# Patient Record
Sex: Male | Born: 1965 | Race: White | Hispanic: No | Marital: Married | Smoking: Current every day smoker
Health system: Southern US, Community
[De-identification: ages and names within clinical notes are randomized; demographics above are authoritative.]

## PROBLEM LIST (undated history)

## (undated) DIAGNOSIS — I890 Lymphedema, not elsewhere classified: Secondary | ICD-10-CM

## (undated) DIAGNOSIS — M419 Scoliosis, unspecified: Secondary | ICD-10-CM

## (undated) DIAGNOSIS — G4730 Sleep apnea, unspecified: Secondary | ICD-10-CM

## (undated) DIAGNOSIS — L03116 Cellulitis of left lower limb: Secondary | ICD-10-CM

## (undated) HISTORY — DX: Cellulitis of left lower limb: L03.116

---

## 1998-11-12 ENCOUNTER — Encounter: Admission: RE | Admit: 1998-11-12 | Discharge: 1998-11-12 | Payer: Self-pay | Admitting: Family Medicine

## 2000-02-03 ENCOUNTER — Encounter: Admission: RE | Admit: 2000-02-03 | Discharge: 2000-02-03 | Payer: Self-pay | Admitting: Family Medicine

## 2000-02-03 ENCOUNTER — Encounter: Payer: Self-pay | Admitting: Family Medicine

## 2000-08-13 ENCOUNTER — Encounter: Admission: RE | Admit: 2000-08-13 | Discharge: 2000-08-13 | Payer: Self-pay | Admitting: Family Medicine

## 2000-08-13 ENCOUNTER — Encounter: Payer: Self-pay | Admitting: Family Medicine

## 2000-08-27 ENCOUNTER — Encounter: Admission: RE | Admit: 2000-08-27 | Discharge: 2000-08-27 | Payer: Self-pay | Admitting: Neurosurgery

## 2000-08-27 ENCOUNTER — Encounter: Payer: Self-pay | Admitting: Neurosurgery

## 2000-09-10 ENCOUNTER — Encounter: Payer: Self-pay | Admitting: Neurosurgery

## 2000-09-10 ENCOUNTER — Encounter: Admission: RE | Admit: 2000-09-10 | Discharge: 2000-09-10 | Payer: Self-pay | Admitting: Neurosurgery

## 2000-09-24 ENCOUNTER — Encounter: Payer: Self-pay | Admitting: Neurosurgery

## 2000-09-24 ENCOUNTER — Encounter: Admission: RE | Admit: 2000-09-24 | Discharge: 2000-09-24 | Payer: Self-pay | Admitting: Neurosurgery

## 2005-02-03 ENCOUNTER — Ambulatory Visit (HOSPITAL_BASED_OUTPATIENT_CLINIC_OR_DEPARTMENT_OTHER): Admission: RE | Admit: 2005-02-03 | Discharge: 2005-02-03 | Payer: Self-pay | Admitting: Family Medicine

## 2005-02-09 ENCOUNTER — Ambulatory Visit: Payer: Self-pay | Admitting: Internal Medicine

## 2005-04-01 ENCOUNTER — Encounter: Admission: RE | Admit: 2005-04-01 | Discharge: 2005-04-01 | Payer: Self-pay | Admitting: Family Medicine

## 2005-04-25 ENCOUNTER — Encounter: Admission: RE | Admit: 2005-04-25 | Discharge: 2005-04-25 | Payer: Self-pay | Admitting: *Deleted

## 2005-05-22 ENCOUNTER — Encounter: Admission: RE | Admit: 2005-05-22 | Discharge: 2005-05-22 | Payer: Self-pay | Admitting: Neurosurgery

## 2005-05-30 ENCOUNTER — Encounter: Admission: RE | Admit: 2005-05-30 | Discharge: 2005-05-30 | Payer: Self-pay | Admitting: Neurosurgery

## 2005-06-18 ENCOUNTER — Encounter: Admission: RE | Admit: 2005-06-18 | Discharge: 2005-06-18 | Payer: Self-pay | Admitting: Neurosurgery

## 2005-07-08 ENCOUNTER — Encounter: Admission: RE | Admit: 2005-07-08 | Discharge: 2005-07-08 | Payer: Self-pay | Admitting: Neurosurgery

## 2005-07-30 ENCOUNTER — Ambulatory Visit: Payer: Self-pay | Admitting: Internal Medicine

## 2005-07-30 ENCOUNTER — Observation Stay (HOSPITAL_COMMUNITY): Admission: EM | Admit: 2005-07-30 | Discharge: 2005-07-31 | Payer: Self-pay | Admitting: Emergency Medicine

## 2005-08-07 ENCOUNTER — Encounter (HOSPITAL_COMMUNITY): Admission: AD | Admit: 2005-08-07 | Discharge: 2005-10-09 | Payer: Self-pay | Admitting: Cardiovascular Disease

## 2005-09-05 ENCOUNTER — Ambulatory Visit (HOSPITAL_COMMUNITY): Admission: RE | Admit: 2005-09-05 | Discharge: 2005-09-05 | Payer: Self-pay | Admitting: Cardiovascular Disease

## 2009-01-29 ENCOUNTER — Encounter: Admission: RE | Admit: 2009-01-29 | Discharge: 2009-01-29 | Payer: Self-pay | Admitting: Internal Medicine

## 2009-02-08 ENCOUNTER — Encounter: Admission: RE | Admit: 2009-02-08 | Discharge: 2009-05-09 | Payer: Self-pay | Admitting: Diagnostic Neuroimaging

## 2010-06-14 NOTE — Procedures (Signed)
NAMEDEVARIO, [name]             ACCOUNT NO.:  [id number]   MEDICAL RECORD NO.:  [id number]          PATIENT TYPE:  OUT   LOCATION:  SLEEP CENTER                 FACILITY:  [facility]   PHYSICIAN:  Clinton D. Maple Hudson, M.D. DATE OF BIRTH:  [birth year]   DATE OF STUDY:  02/03/2005                              NOCTURNAL POLYSOMNOGRAM   REFERRING PHYSICIAN:  Dr. Blair Heys.   DATE OF STUDY:  January 8. 2007.   INDICATION FOR STUDY:  Hypersomnia with sleep apnea.   EPWORTH SLEEPINESS SCORE:  11/24.   BMI:  28.   WEIGHT:  235 pounds.   HOME MEDICATIONS:  Celebrex, Zoloft, Tricor, Ambien.   SLEEP ARCHITECTURE:  Total sleep time 319 minutes with sleep efficiency 89%.  Stage I 8%, stage II 84%, stages III and IV absent, REM 8% of total sleep  time. Sleep latency 19 minutes, REM latency 217 minutes, awake after sleep  onset 21 minutes, arousal index 20. He took Ambien at 9 a.m. before this  daytime study which was reported from 9:40 a.m. to the 9:54 a.m. until 4:02  p.m.   RESPIRATORY DATA:  Split study protocol:  Apnea/hypopnea index (AHI, RDI)  27.2 obstructive events per hour indicating moderate obstructive sleep  apnea/hypopnea syndrome before C-PAP. This for included 1 obstructive apnea  and 43 hypopneas before C-PAP. Events were more common while supine (AHI  10.5 per hour). REM AHI of 2.4. C-PAP was titrated to 7 CWP, AHI 0.5 per  hour. A medium Respironics comfort select nasal mask was used with heated  humidifier.   OXYGEN DATA:  Moderate to loud snoring with oxygen desaturation to a nadir  of 92% before C-PAP. After C-PAP control, saturation held 96% on room air.   CARDIAC DATA:  Normal sinus rhythm.   MOVEMENT/PARASOMNIA:  A total of 138 limb jerks were reported of which 21  were associated with arousal or awakening for periodic limb movement with  arousal index of 3.9 per hour which is mildly increased.   IMPRESSION/RECOMMENDATIONS:  1.  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  2.  Moderate obstructive sleep apnea/hypopnea syndrome, AHI 27.2 per hour      with events more common while supine, moderate to loud snoring and      oxygen desaturation to 92%.  3.  Successful C-PAP titration to 7 CWP, AHI 0.5 per hour. A medium      Respironics comfort select nasal mask was used with heated humidifier.  4.  Periodic limb movement with arousal, 3.9 per hour. This can be      considered clinically for significance after he has had time to adjust      to C-PAP.      Clinton D. Maple Hudson, M.D.  Diplomate, Biomedical engineer of Sleep Medicine  Electronically Signed     CDY/MEDQ  D:  02/09/2005 13:32:30  T:  02/09/2005 23:54:27  Job:  [id number]

## 2010-06-14 NOTE — Cardiovascular Report (Signed)
NAMEELLEN, MAYOL             ACCOUNT NO.:  [id number]   MEDICAL RECORD NO.:  [id number]          PATIENT TYPE:  OIB   LOCATION:  2899                         FACILITY:  [facility]   PHYSICIAN:  Ricki Rodriguez, M.D.  DATE OF BIRTH:  Jun 04, 1965   DATE OF PROCEDURE:  09/05/2005  DATE OF DISCHARGE:                              CARDIAC CATHETERIZATION   PROCEDURE:  Left heart catheterization, selective coronary angiography, left  ventricular function study.   CARDIOLOGIST:  Ricki Rodriguez, M.D.   INDICATIONS FOR PROCEDURE:  This 45 year old white male had recurrent chest  pain and an abnormal nuclear stress test.   APPROACH:  Right femoral artery using  4-French sheath and catheters.   HEMODYNAMIC DATA:  Left ventricular pressure:  112/15.  Aortic pressure:  115/79.   LEFT VENTRICULOGRAM:  The left ventriculogram showed mild apical hypokinesia  with ejection fraction of 55%-60%.   CORONARY ANATOMY:  1. LEFT MAIN CORONARY ARTERY:  The left main coronary artery was      unremarkable.  2. LEFT ANTERIOR DESCENDING CORONARY ARTERY:  The left anterior descending      coronary artery wrapped around the apex of the heart and was      unremarkable.  The first diagonal was also unremarkable.  3. LEFT CIRCUMFLEX CORONARY ARTERY:  The left circumflex coronary artery      was also unremarkable and its obtuse marginal branch was normal.  4. RIGHT CORONARY ARTERY:  The right coronary artery was dominant.  It had      normal posterior descending coronary artery and the posterolateral      branch was a large vessel and normal marginal branch-I the marginal      branch-II arose near the origin of the posterior descending coronary      artery as a trifurcation of the right coronary artery.   IMPRESSION:  1. Normal coronaries.  2. Mild left ventricular systolic dysfunction.   RECOMMENDATIONS:  This patient will be treated medically.      Ricki Rodriguez, M.D.  Electronically Signed    ASK/MEDQ  D:  09/05/2005  T:  09/05/2005  Job:  [id number]

## 2010-08-14 ENCOUNTER — Other Ambulatory Visit: Payer: Self-pay | Admitting: Internal Medicine

## 2010-08-14 DIAGNOSIS — M545 Low back pain, unspecified: Secondary | ICD-10-CM

## 2010-08-17 ENCOUNTER — Ambulatory Visit
Admission: RE | Admit: 2010-08-17 | Discharge: 2010-08-17 | Disposition: A | Discharge status: Home / Self Care | Payer: Private Health Insurance - Indemnity | Source: Ambulatory Visit | Attending: Internal Medicine | Admitting: Internal Medicine

## 2010-08-17 DIAGNOSIS — M545 Low back pain, unspecified: Secondary | ICD-10-CM

## 2010-10-16 ENCOUNTER — Other Ambulatory Visit: Payer: Self-pay | Admitting: Internal Medicine

## 2010-10-16 DIAGNOSIS — IMO0002 Reserved for concepts with insufficient information to code with codable children: Secondary | ICD-10-CM

## 2010-12-02 ENCOUNTER — Inpatient Hospital Stay: Admission: RE | Admit: 2010-12-02 | Payer: No Typology Code available for payment source | Source: Ambulatory Visit

## 2010-12-20 ENCOUNTER — Other Ambulatory Visit: Payer: No Typology Code available for payment source

## 2011-10-17 ENCOUNTER — Other Ambulatory Visit: Payer: Self-pay | Admitting: Internal Medicine

## 2011-10-17 DIAGNOSIS — I83819 Varicose veins of unspecified lower extremities with pain: Secondary | ICD-10-CM

## 2011-11-06 ENCOUNTER — Inpatient Hospital Stay
Admission: RE | Admit: 2011-11-06 | Discharge: 2011-11-06 | Payer: Private Health Insurance - Indemnity | Source: Ambulatory Visit | Attending: Internal Medicine | Admitting: Internal Medicine

## 2011-11-06 ENCOUNTER — Other Ambulatory Visit: Payer: Private Health Insurance - Indemnity

## 2013-07-21 DIAGNOSIS — J302 Other seasonal allergic rhinitis: Secondary | ICD-10-CM | POA: Insufficient documentation

## 2013-07-21 DIAGNOSIS — I10 Essential (primary) hypertension: Secondary | ICD-10-CM | POA: Insufficient documentation

## 2013-07-21 DIAGNOSIS — M1990 Unspecified osteoarthritis, unspecified site: Secondary | ICD-10-CM

## 2013-07-21 HISTORY — DX: Unspecified osteoarthritis, unspecified site: M19.90

## 2013-07-21 HISTORY — DX: Essential (primary) hypertension: I10

## 2013-07-21 HISTORY — DX: Other seasonal allergic rhinitis: J30.2

## 2015-06-14 DIAGNOSIS — G4733 Obstructive sleep apnea (adult) (pediatric): Secondary | ICD-10-CM | POA: Diagnosis not present

## 2015-06-14 DIAGNOSIS — R600 Localized edema: Secondary | ICD-10-CM | POA: Diagnosis not present

## 2015-06-14 DIAGNOSIS — E782 Mixed hyperlipidemia: Secondary | ICD-10-CM | POA: Diagnosis not present

## 2015-08-06 DIAGNOSIS — Z1211 Encounter for screening for malignant neoplasm of colon: Secondary | ICD-10-CM | POA: Diagnosis not present

## 2015-08-06 DIAGNOSIS — K5730 Diverticulosis of large intestine without perforation or abscess without bleeding: Secondary | ICD-10-CM | POA: Diagnosis not present

## 2015-08-09 DIAGNOSIS — E7800 Pure hypercholesterolemia, unspecified: Secondary | ICD-10-CM | POA: Diagnosis not present

## 2015-08-09 DIAGNOSIS — Z131 Encounter for screening for diabetes mellitus: Secondary | ICD-10-CM | POA: Diagnosis not present

## 2015-08-09 DIAGNOSIS — Z125 Encounter for screening for malignant neoplasm of prostate: Secondary | ICD-10-CM | POA: Diagnosis not present

## 2015-08-09 DIAGNOSIS — Z0001 Encounter for general adult medical examination with abnormal findings: Secondary | ICD-10-CM | POA: Diagnosis not present

## 2015-08-13 DIAGNOSIS — Z0000 Encounter for general adult medical examination without abnormal findings: Secondary | ICD-10-CM | POA: Diagnosis not present

## 2015-08-13 DIAGNOSIS — R600 Localized edema: Secondary | ICD-10-CM | POA: Diagnosis not present

## 2015-08-13 DIAGNOSIS — M150 Primary generalized (osteo)arthritis: Secondary | ICD-10-CM | POA: Diagnosis not present

## 2015-08-13 DIAGNOSIS — G4733 Obstructive sleep apnea (adult) (pediatric): Secondary | ICD-10-CM | POA: Diagnosis not present

## 2015-09-13 DIAGNOSIS — G894 Chronic pain syndrome: Secondary | ICD-10-CM | POA: Diagnosis not present

## 2015-09-13 DIAGNOSIS — Z79899 Other long term (current) drug therapy: Secondary | ICD-10-CM | POA: Diagnosis not present

## 2015-09-13 DIAGNOSIS — M545 Low back pain: Secondary | ICD-10-CM | POA: Diagnosis not present

## 2015-09-13 DIAGNOSIS — M25519 Pain in unspecified shoulder: Secondary | ICD-10-CM | POA: Diagnosis not present

## 2015-09-13 DIAGNOSIS — Z79891 Long term (current) use of opiate analgesic: Secondary | ICD-10-CM | POA: Diagnosis not present

## 2015-09-20 DIAGNOSIS — M5126 Other intervertebral disc displacement, lumbar region: Secondary | ICD-10-CM | POA: Diagnosis not present

## 2015-09-20 DIAGNOSIS — M5125 Other intervertebral disc displacement, thoracolumbar region: Secondary | ICD-10-CM | POA: Diagnosis not present

## 2015-09-20 DIAGNOSIS — M19011 Primary osteoarthritis, right shoulder: Secondary | ICD-10-CM | POA: Diagnosis not present

## 2015-09-20 DIAGNOSIS — M47816 Spondylosis without myelopathy or radiculopathy, lumbar region: Secondary | ICD-10-CM | POA: Diagnosis not present

## 2015-09-20 DIAGNOSIS — M66821 Spontaneous rupture of other tendons, right upper arm: Secondary | ICD-10-CM | POA: Diagnosis not present

## 2015-09-20 DIAGNOSIS — M5137 Other intervertebral disc degeneration, lumbosacral region: Secondary | ICD-10-CM | POA: Diagnosis not present

## 2015-09-20 DIAGNOSIS — M7581 Other shoulder lesions, right shoulder: Secondary | ICD-10-CM | POA: Diagnosis not present

## 2015-09-20 DIAGNOSIS — M75111 Incomplete rotator cuff tear or rupture of right shoulder, not specified as traumatic: Secondary | ICD-10-CM | POA: Diagnosis not present

## 2015-09-27 DIAGNOSIS — M75101 Unspecified rotator cuff tear or rupture of right shoulder, not specified as traumatic: Secondary | ICD-10-CM | POA: Diagnosis not present

## 2015-09-27 DIAGNOSIS — M545 Low back pain: Secondary | ICD-10-CM | POA: Diagnosis not present

## 2015-09-27 DIAGNOSIS — G894 Chronic pain syndrome: Secondary | ICD-10-CM | POA: Diagnosis not present

## 2015-09-27 DIAGNOSIS — M5417 Radiculopathy, lumbosacral region: Secondary | ICD-10-CM | POA: Diagnosis not present

## 2015-09-27 DIAGNOSIS — M79606 Pain in leg, unspecified: Secondary | ICD-10-CM | POA: Diagnosis not present

## 2016-06-17 DIAGNOSIS — F17200 Nicotine dependence, unspecified, uncomplicated: Secondary | ICD-10-CM | POA: Diagnosis not present

## 2016-06-17 DIAGNOSIS — R0602 Shortness of breath: Secondary | ICD-10-CM | POA: Diagnosis not present

## 2016-06-17 DIAGNOSIS — R600 Localized edema: Secondary | ICD-10-CM | POA: Diagnosis not present

## 2016-06-17 DIAGNOSIS — E782 Mixed hyperlipidemia: Secondary | ICD-10-CM | POA: Diagnosis not present

## 2016-06-30 DIAGNOSIS — R600 Localized edema: Secondary | ICD-10-CM | POA: Diagnosis not present

## 2016-06-30 DIAGNOSIS — R5383 Other fatigue: Secondary | ICD-10-CM | POA: Diagnosis not present

## 2016-08-04 DIAGNOSIS — Z136 Encounter for screening for cardiovascular disorders: Secondary | ICD-10-CM | POA: Diagnosis not present

## 2016-08-04 DIAGNOSIS — I872 Venous insufficiency (chronic) (peripheral): Secondary | ICD-10-CM | POA: Diagnosis not present

## 2016-08-04 DIAGNOSIS — I739 Peripheral vascular disease, unspecified: Secondary | ICD-10-CM | POA: Diagnosis not present

## 2016-08-04 DIAGNOSIS — R600 Localized edema: Secondary | ICD-10-CM | POA: Diagnosis not present

## 2016-08-07 DIAGNOSIS — F17200 Nicotine dependence, unspecified, uncomplicated: Secondary | ICD-10-CM

## 2016-08-07 HISTORY — DX: Nicotine dependence, unspecified, uncomplicated: F17.200

## 2016-08-08 ENCOUNTER — Other Ambulatory Visit: Payer: Self-pay | Admitting: Cardiology

## 2016-08-08 DIAGNOSIS — I872 Venous insufficiency (chronic) (peripheral): Secondary | ICD-10-CM

## 2016-08-15 DIAGNOSIS — Z136 Encounter for screening for cardiovascular disorders: Secondary | ICD-10-CM | POA: Diagnosis not present

## 2016-08-15 DIAGNOSIS — I739 Peripheral vascular disease, unspecified: Secondary | ICD-10-CM | POA: Diagnosis not present

## 2016-08-28 DIAGNOSIS — R9439 Abnormal result of other cardiovascular function study: Secondary | ICD-10-CM | POA: Diagnosis not present

## 2016-08-28 DIAGNOSIS — R600 Localized edema: Secondary | ICD-10-CM | POA: Diagnosis not present

## 2016-09-03 ENCOUNTER — Ambulatory Visit
Admission: RE | Admit: 2016-09-03 | Discharge: 2016-09-03 | Disposition: A | Discharge status: Home / Self Care | Payer: BLUE CROSS/BLUE SHIELD | Source: Ambulatory Visit | Attending: Cardiology | Admitting: Cardiology

## 2016-09-03 DIAGNOSIS — I872 Venous insufficiency (chronic) (peripheral): Secondary | ICD-10-CM

## 2016-09-03 DIAGNOSIS — M7989 Other specified soft tissue disorders: Secondary | ICD-10-CM | POA: Diagnosis not present

## 2016-09-03 NOTE — Consult Note (Signed)
Chief Complaint: Patient was seen in consultation today for  Chief Complaint  Patient presents with  . Advice Only    Venous Insufficiency   at the request of Ganji,Jay  Referring Physician(s): Ganji,Jay  History of Present Illness: [name] is a 51 y.o. male referred for evaluation of his lower extremity venous circulation. He suffered a fall and laceration to his anterior right calf which became infected and was slow to heal. He also describes bilateral lower extremity edema, right worse than left. He has bilateral lower extremity tiredness, pain, lower leg skin trophic changes, and visible spider veins. There is a small patch of varicose veins along the medial right knee.  No past medical history on file.  Obstructive sleep apnea  Hyperlipidemia No past surgical history on file.  Allergies: Patient has no known allergies.  Medications: Prior to Admission medications   Medication Sig Start Date End Date Taking? Authorizing Provider  buprenorphine-naloxone (SUBOXONE) 8-2 mg SUBL SL tablet Place 1 tablet under the tongue 2 (two) times daily with a meal.   Yes [provider]  cloNIDine (CATAPRES) 0.1 MG tablet Take 0.1 mg by mouth 2 (two) times daily as needed.   Yes [provider]  cyclobenzaprine (FLEXERIL) 10 MG tablet Take 10 mg by mouth 3 (three) times daily as needed for muscle spasms.   Yes [provider]  fluticasone (FLONASE) 50 MCG/ACT nasal spray Place 2 sprays into both nostrils daily.   Yes [provider]  furosemide (LASIX) 20 MG tablet Take 20 mg by mouth daily.   Yes [provider]  ibuprofen (ADVIL,MOTRIN) 200 MG tablet Take 200 mg by mouth every 6 (six) hours as needed.   Yes [provider]  methocarbamol (ROBAXIN) 500 MG tablet Take 500 mg by mouth 3 (three) times daily as needed for muscle spasms.   Yes [provider]  naproxen (NAPROSYN) 500 MG tablet Take 500 mg by mouth 2  (two) times daily as needed.   Yes [provider]  varenicline (CHANTIX) 1 MG tablet Take 1 mg by mouth 2 (two) times daily.   Yes [provider]     No family history on file.  Social History   Social History  . Marital status: Married    Spouse name: N/A  . Number of children: N/A  . Years of education: N/A   Social History Main Topics  . Smoking status: Not on file  . Smokeless tobacco: Not on file  . Alcohol use Not on file  . Drug use: Unknown  . Sexual activity: Not on file   Other Topics Concern  . Not on file   Social History Narrative  . No narrative on file  He works at a tobacco processing site.  ECOG Status: 1 - Symptomatic but completely ambulatory  Review of Systems: A 12 point ROS discussed and pertinent positives are indicated in the HPI above.  All other systems are negative.  Review of Systems  Vital Signs: BP (!) 142/86   Pulse 81   Temp 98.3 F (36.8 C) (Oral)   Resp 14   Ht 6\' 3"  (1.905 m)   Wt 255 lb (115.7 kg)   SpO2 99%   BMI 31.87 kg/m   Physical Exam Constitutional: Oriented to person, place, and time. Well-developed and well-nourished. No distress.  HENT:  Head: Normocephalic and atraumatic.  Eyes: Conjunctivae and EOM are normal. Right eye exhibits no discharge. Left eye exhibits no discharge. No  scleral icterus.  Neck: No JVD present.  Pulmonary/Chest: Effort normal. No stridor. No respiratory distress.  Abdomen: soft, non distended Neurological:  alert and oriented to person, place, and time.  Skin: Skin is warm and dry.  not diaphoretic.  There is greatest discoloration around his ankles right worse than left. Small blue varicose vein along the medial right knee.  Psychiatric:   normal mood and affect.   behavior is normal. Judgment and thought content normal.  Mallampati Score:     Imaging: US Venous Img Lower Bilateral  Result Date: 09/03/2016 CLINICAL DATA:  Slow healing of right lower leg wound  recently. Bilateral lower leg swelling right worse than left. EXAM: BILATERAL LOWER EXTREMITY VENOUS DOPPLER ULTRASOUND TECHNIQUE: Gray-scale sonography with graded compression, as well as color Doppler and duplex ultrasound, were performed to evaluate the deep and superficial veins of both lower extremities. Spectral Doppler was utilized to evaluate flow at rest and with distal augmentation maneuvers. A complete superficial venous insufficiency exam was performed in the upright standing position. I personally performed the technical portion of the exam. COMPARISON:  None. FINDINGS: Deep Venous System: Evaluation of the deep venous systemS including the common femoral, femoral, profunda femoral, popliteal and calf veins (where visible) demonstrate no evidence of deep venous thrombosis. The vessels are compressible and demonstrate normal respiratory phasicity and response to augmentation. No evidence of the deep venous reflux. Superficial Venous System: RIGHT SFJ: patent, normal in caliber, no reflux post augmentation GSV Prox Thigh: Negative GSV Mid Thigh: Negative GSV Lower Thigh: Negative GSV at Knee: Negative. There is a superficial tributary which extends medially, draining through patent proximal and distal calf perforated veins. GSV Prox Calf: Negative GSV Mid Calf: Negative GSV Distal Calf: Negative SPJ: Not visualized SSV Prox: Negative SSV Mid: Negative SSV Distal: Negative LEFT SFJ: Patent, normal in caliber, no reflux post augmentation GSV Prox Thigh: Negative GSV Mid Thigh: Negative GSV Lower Thigh: Negative GSV at Knee: Negative GSV Prox Calf: Patent, nondilated, with 2 seconds reflux post augmentation. GSV Mid Calf: Patent, normal in caliber, with 1.5 seconds reflux post augmentation. There is a superficial tributary which drains through a patent distal calf perforator vein. GSV Distal Calf: Negative SPJ: Not visualized SSV Prox: Negative SSV Mid: Negative SSV Distal: Negative Other: Mild subcutaneous  edema at the  right ankle. IMPRESSION: 1. Normal bilateral lower extremity deep venous systems. No evidence of acute or chronic DVT. 2. Mildly prominent subcutaneous tributary along the medial right knee half without associated saphenous venous valvular incompetence or reflux. 3. Short-segment of valvular incompetence and reflux in the left great saphenous vein in the proximal and mid calf, without significant associated varicose veins. Electronically Signed   By: Lucrezia Europe M.D.   On: 09/03/2016 16:46    Labs:  CBC: No results for input(s): WBC, HGB, HCT, PLT in the last 8760 hours.  COAGS: No results for input(s): INR, APTT in the last 8760 hours.  BMP: No results for input(s): NA, K, CL, CO2, GLUCOSE, BUN, CALCIUM, CREATININE, GFRNONAA, GFRAA in the last 8760 hours.  Invalid input(s): CMP  LIVER FUNCTION TESTS: No results for input(s): BILITOT, AST, ALT, ALKPHOS, PROT, ALBUMIN in the last 8760 hours.  TUMOR MARKERS: No results for input(s): AFPTM, CEA, CA199, CHROMGRNA in the last 8760 hours.  Assessment and Plan:  My impression is that there is not adequate lower extremity superficial saphenous venous valvular incompetence or reflux to account for his lower extremity swelling. The small varicose vein complex along  the medial right knee and calf is of unlikely clinical significance. While this would be approachable for percutaneous guided sclerotherapy, I'm not sure this would result in clinical benefit. The short refluxing segment of the great saphenous vein on the left is not associated with any varicose veins.  I personally participated in performance of the ultrasound assessment of the superficial venous systems of both legs. I reviewed the findings with the patient. I reviewed the pathophysiology of venous valvular incompetence and reflux. We reviewed potential treatment options including graduated compression hose and sclerotherapy. We both agreed that it would be more fruitful to  pursue other avenues regarding his lower extremity swelling at this time. He is getting some relief from the Lasix  now. He knows to call should he choose to proceed with consideration of possible intervention. He will follow-up primarily with Dr.Ganji's office.   Thank you for this interesting consult.  I greatly enjoyed meeting [name] and look forward to participating in their care.  A copy of this report was sent to the requesting provider on this date.  Electronically Signed: Hayzel Ruberg III, DAYNE Lailah Marcelli 09/03/2016, 5:16 PM   I spent a total of  40 Minutes   in face to face in clinical consultation, greater than 50% of which was counseling/coordinating care for lower extremity swelling.

## 2016-09-11 DIAGNOSIS — I872 Venous insufficiency (chronic) (peripheral): Secondary | ICD-10-CM | POA: Diagnosis not present

## 2016-09-11 DIAGNOSIS — R600 Localized edema: Secondary | ICD-10-CM | POA: Diagnosis not present

## 2016-09-11 DIAGNOSIS — Z136 Encounter for screening for cardiovascular disorders: Secondary | ICD-10-CM | POA: Diagnosis not present

## 2016-09-11 DIAGNOSIS — I739 Peripheral vascular disease, unspecified: Secondary | ICD-10-CM | POA: Diagnosis not present

## 2016-09-24 DIAGNOSIS — E782 Mixed hyperlipidemia: Secondary | ICD-10-CM | POA: Diagnosis not present

## 2016-09-24 DIAGNOSIS — R600 Localized edema: Secondary | ICD-10-CM | POA: Diagnosis not present

## 2016-09-24 DIAGNOSIS — Z0000 Encounter for general adult medical examination without abnormal findings: Secondary | ICD-10-CM | POA: Diagnosis not present

## 2016-09-24 DIAGNOSIS — G4733 Obstructive sleep apnea (adult) (pediatric): Secondary | ICD-10-CM | POA: Diagnosis not present

## 2016-10-14 DIAGNOSIS — R600 Localized edema: Secondary | ICD-10-CM | POA: Diagnosis not present

## 2016-10-22 DIAGNOSIS — L821 Other seborrheic keratosis: Secondary | ICD-10-CM | POA: Diagnosis not present

## 2016-10-22 DIAGNOSIS — D225 Melanocytic nevi of trunk: Secondary | ICD-10-CM | POA: Diagnosis not present

## 2016-11-04 DIAGNOSIS — Z0189 Encounter for other specified special examinations: Secondary | ICD-10-CM | POA: Diagnosis not present

## 2016-11-04 DIAGNOSIS — I872 Venous insufficiency (chronic) (peripheral): Secondary | ICD-10-CM | POA: Diagnosis not present

## 2016-11-04 DIAGNOSIS — R600 Localized edema: Secondary | ICD-10-CM | POA: Diagnosis not present

## 2016-11-04 DIAGNOSIS — I739 Peripheral vascular disease, unspecified: Secondary | ICD-10-CM | POA: Diagnosis not present

## 2017-09-24 DIAGNOSIS — Z0000 Encounter for general adult medical examination without abnormal findings: Secondary | ICD-10-CM | POA: Diagnosis not present

## 2017-09-24 DIAGNOSIS — Z131 Encounter for screening for diabetes mellitus: Secondary | ICD-10-CM | POA: Diagnosis not present

## 2017-09-24 DIAGNOSIS — E782 Mixed hyperlipidemia: Secondary | ICD-10-CM | POA: Diagnosis not present

## 2017-09-24 DIAGNOSIS — Z125 Encounter for screening for malignant neoplasm of prostate: Secondary | ICD-10-CM | POA: Diagnosis not present

## 2017-10-14 ENCOUNTER — Encounter (HOSPITAL_COMMUNITY): Payer: Self-pay

## 2017-10-14 ENCOUNTER — Other Ambulatory Visit: Payer: Self-pay

## 2017-10-14 ENCOUNTER — Emergency Department (HOSPITAL_COMMUNITY)
Admission: EM | Admit: 2017-10-14 | Discharge: 2017-10-14 | Disposition: A | Discharge status: Home / Self Care | Payer: BLUE CROSS/BLUE SHIELD | Attending: Emergency Medicine | Admitting: Emergency Medicine

## 2017-10-14 DIAGNOSIS — Z79899 Other long term (current) drug therapy: Secondary | ICD-10-CM | POA: Diagnosis not present

## 2017-10-14 DIAGNOSIS — L03115 Cellulitis of right lower limb: Secondary | ICD-10-CM | POA: Insufficient documentation

## 2017-10-14 DIAGNOSIS — R2241 Localized swelling, mass and lump, right lower limb: Secondary | ICD-10-CM | POA: Diagnosis present

## 2017-10-14 DIAGNOSIS — R600 Localized edema: Secondary | ICD-10-CM | POA: Diagnosis not present

## 2017-10-14 DIAGNOSIS — R609 Edema, unspecified: Secondary | ICD-10-CM

## 2017-10-14 LAB — CBC WITH DIFFERENTIAL/PLATELET
ABS IMMATURE GRANULOCYTES: 0 10*3/uL (ref 0.0–0.1)
Basophils Absolute: 0 10*3/uL (ref 0.0–0.1)
Basophils Relative: 0 %
Eosinophils Absolute: 0.2 10*3/uL (ref 0.0–0.7)
Eosinophils Relative: 2 %
HCT: 40.7 % (ref 39.0–52.0)
HEMOGLOBIN: 14 g/dL (ref 13.0–17.0)
Immature Granulocytes: 0 %
LYMPHS PCT: 21 %
Lymphs Abs: 2.1 10*3/uL (ref 0.7–4.0)
MCH: 31 pg (ref 26.0–34.0)
MCHC: 34.4 g/dL (ref 30.0–36.0)
MCV: 90.2 fL (ref 78.0–100.0)
MONO ABS: 1.2 10*3/uL — AB (ref 0.1–1.0)
MONOS PCT: 12 %
NEUTROS ABS: 6.3 10*3/uL (ref 1.7–7.7)
Neutrophils Relative %: 65 %
Platelets: 220 10*3/uL (ref 150–400)
RBC: 4.51 MIL/uL (ref 4.22–5.81)
RDW: 12.3 % (ref 11.5–15.5)
WBC: 9.9 10*3/uL (ref 4.0–10.5)

## 2017-10-14 LAB — COMPREHENSIVE METABOLIC PANEL
ALK PHOS: 81 U/L (ref 38–126)
ALT: 27 U/L (ref 0–44)
AST: 25 U/L (ref 15–41)
Albumin: 3.9 g/dL (ref 3.5–5.0)
Anion gap: 11 (ref 5–15)
BILIRUBIN TOTAL: 0.6 mg/dL (ref 0.3–1.2)
BUN: 13 mg/dL (ref 6–20)
CO2: 23 mmol/L (ref 22–32)
CREATININE: 0.93 mg/dL (ref 0.61–1.24)
Calcium: 9 mg/dL (ref 8.9–10.3)
Chloride: 101 mmol/L (ref 98–111)
GFR calc Af Amer: >60 mL/min (ref >60)
GFR calc non Af Amer: >60 mL/min (ref >60)
Glucose, Bld: 116 mg/dL — ABNORMAL HIGH (ref 70–99)
Potassium: 3.8 mmol/L (ref 3.5–5.1)
Sodium: 135 mmol/L (ref 135–145)
TOTAL PROTEIN: 7.2 g/dL (ref 6.5–8.1)

## 2017-10-14 LAB — I-STAT CG4 LACTIC ACID, ED: Lactic Acid, Venous: 1.13 mmol/L (ref 0.5–1.9)

## 2017-10-14 MED ORDER — DOXYCYCLINE HYCLATE 100 MG PO CAPS: 100.0000 mg | ORAL_CAPSULE | Freq: Two times a day (BID) | ORAL | 0 refills | Status: DC

## 2017-10-14 MED ORDER — ENOXAPARIN SODIUM 120 MG/0.8ML ~~LOC~~ SOLN
1.0000 mg/kg | Freq: Once | SUBCUTANEOUS | Status: AC
  Administered 2017-10-14: 120 mg via SUBCUTANEOUS
  Filled 2017-10-14: qty 0.8

## 2017-10-14 NOTE — Discharge Instructions (Signed)
Follow-up tomorrow with Dr. Ashby Dawes as planned.

## 2017-10-14 NOTE — ED Triage Notes (Signed)
Pt presents for evaluation of lower R leg swelling and redness starting Saturday. States was mild until today. No previous treatment. No injury or trauma. Reports fevers at home.

## 2017-10-14 NOTE — ED Provider Notes (Signed)
[facility] EMERGENCY DEPARTMENT Provider Note   CSN: [id number] Arrival date & time: 10/14/17  1751     History   Chief Complaint Chief Complaint  Patient presents with  . Cellulitis    HPI [name] is a 52 y.o. male.  HPI Patient presents with some pain and swelling in his right lower leg.  Began around 5 days ago.  Has worsened over the last couple days.  States he has had some chills and possibly fever.  Has some swelling in his legs but usually not this big.  Worse in the right side compared to left.  No chest pain or trouble breathing.  No trauma.  States around a year ago he had similar symptoms and had an ultrasound done that did not show blood clots at that time.  Sees Dr. Ashby Dawes and is follow-up scheduled for tomorrow.  He is on Lasix and had previously also been on Spironolactone. History reviewed. No pertinent past medical history.  There are no active problems to display for this patient.   History reviewed. No pertinent surgical history.      Home Medications    Prior to Admission medications   Medication Sig Start Date End Date Taking? Authorizing Provider  buprenorphine-naloxone (SUBOXONE) 8-2 mg SUBL SL tablet Place 1 tablet under the tongue 2 (two) times daily with a meal.    [provider]  cloNIDine (CATAPRES) 0.1 MG tablet Take 0.1 mg by mouth 2 (two) times daily as needed.    [provider]  cyclobenzaprine (FLEXERIL) 10 MG tablet Take 10 mg by mouth 3 (three) times daily as needed for muscle spasms.    [provider]  doxycycline (VIBRAMYCIN) 100 MG capsule Take 1 capsule (100 mg total) by mouth 2 (two) times daily. 10/14/17   Davonna Belling, MD  fluticasone (FLONASE) 50 MCG/ACT nasal spray Place 2 sprays into both nostrils daily.    [provider]  furosemide (LASIX) 20 MG tablet Take 20 mg by mouth daily.    [provider]  ibuprofen (ADVIL,MOTRIN) 200 MG tablet Take  200 mg by mouth every 6 (six) hours as needed.    [provider]  methocarbamol (ROBAXIN) 500 MG tablet Take 500 mg by mouth 3 (three) times daily as needed for muscle spasms.    [provider]  naproxen (NAPROSYN) 500 MG tablet Take 500 mg by mouth 2 (two) times daily as needed.    [provider]  varenicline (CHANTIX) 1 MG tablet Take 1 mg by mouth 2 (two) times daily.    [provider]    Family History No family history on file.  Social History Social History   Tobacco Use  . Smoking status: Not on file  Substance Use Topics  . Alcohol use: Not on file  . Drug use: Not on file     Allergies   Patient has no known allergies.   Review of Systems Review of Systems  Constitutional: Positive for chills. Negative for appetite change.  HENT: Negative for congestion.   Respiratory: Negative for shortness of breath.   Cardiovascular: Positive for leg swelling. Negative for chest pain.  Gastrointestinal: Negative for abdominal distention.  Genitourinary: Negative for flank pain.  Musculoskeletal: Negative for back pain.  Skin: Negative for rash.  Neurological: Negative for weakness.  Hematological: Negative for adenopathy.  Psychiatric/Behavioral: Negative for confusion.     Physical Exam Updated Vital Signs BP (!) 157/97 (BP Location: Right Arm)  Pulse 77   Temp 98.6 F (37 C) (Oral)   Resp 20   SpO2 100%   Physical Exam  Constitutional: He appears well-developed.  HENT:  Head: Normocephalic.  Neck: Neck supple.  Cardiovascular: Normal rate.  Pulmonary/Chest: Effort normal.  Abdominal: There is no tenderness.  Musculoskeletal: He exhibits edema.  Pitting edema bilateral lower extremities.  Worse on the right side.  There is erythema with mild warmth over the lower half of the leg.  Is worse above where his socks are.  No fluctuance.  Pulse intact distally.  Good capillary refill distally.  Neurological: He is alert.    Skin: Skin is warm. Capillary refill takes less than 2 seconds.     ED Treatments / Results  Labs (all labs ordered are listed, but only abnormal results are displayed) Labs Reviewed  COMPREHENSIVE METABOLIC PANEL - Abnormal; Notable for the following components:      Result Value   Glucose, Bld 116 (*)    All other components within normal limits  CBC WITH DIFFERENTIAL/PLATELET - Abnormal; Notable for the following components:   Monocytes Absolute 1.2 (*)    All other components within normal limits  I-STAT CG4 LACTIC ACID, ED    EKG None  Radiology No results found.  Procedures Procedures (including critical care time)  Medications Ordered in ED Medications  enoxaparin (LOVENOX) injection 1 mg/kg (has no administration in time range)     Initial Impression / Assessment and Plan / ED Course  I have reviewed the triage vital signs and the nursing notes.  Pertinent labs & imaging results that were available during my care of the patient were reviewed by me and considered in my medical decision making (see chart for details).     Patient peripheral edema of bilateral lower extremities worse on the right.  Also has erythema on the right side.  Will treat empirically for cellulitis.  Will get outpatient Doppler and has follow-up with PCP tomorrow.  PCP can adjust medications for the worsening edema  Final Clinical Impressions(s) / ED Diagnoses   Final diagnoses:  Cellulitis of right lower extremity  Peripheral edema    ED Discharge Orders         Ordered    LE VENOUS     10/14/17 2024    doxycycline (VIBRAMYCIN) 100 MG capsule  2 times daily     10/14/17 2025           Davonna Belling, MD 10/14/17 2035

## 2017-10-15 ENCOUNTER — Ambulatory Visit (HOSPITAL_COMMUNITY): Admission: RE | Admit: 2017-10-15 | Payer: BLUE CROSS/BLUE SHIELD | Source: Ambulatory Visit

## 2017-10-15 ENCOUNTER — Other Ambulatory Visit: Payer: Self-pay | Admitting: Internal Medicine

## 2017-10-15 DIAGNOSIS — M7989 Other specified soft tissue disorders: Secondary | ICD-10-CM

## 2017-10-16 ENCOUNTER — Ambulatory Visit
Admission: RE | Admit: 2017-10-16 | Discharge: 2017-10-16 | Disposition: A | Discharge status: Home / Self Care | Payer: BLUE CROSS/BLUE SHIELD | Source: Ambulatory Visit | Attending: Internal Medicine | Admitting: Internal Medicine

## 2017-10-16 DIAGNOSIS — M7989 Other specified soft tissue disorders: Secondary | ICD-10-CM

## 2017-10-16 DIAGNOSIS — R600 Localized edema: Secondary | ICD-10-CM | POA: Diagnosis not present

## 2017-10-19 ENCOUNTER — Other Ambulatory Visit: Payer: Self-pay | Admitting: Internal Medicine

## 2017-10-26 DIAGNOSIS — R600 Localized edema: Secondary | ICD-10-CM | POA: Diagnosis not present

## 2017-10-26 DIAGNOSIS — B370 Candidal stomatitis: Secondary | ICD-10-CM | POA: Diagnosis not present

## 2017-10-26 DIAGNOSIS — Z0000 Encounter for general adult medical examination without abnormal findings: Secondary | ICD-10-CM | POA: Diagnosis not present

## 2017-10-26 DIAGNOSIS — I872 Venous insufficiency (chronic) (peripheral): Secondary | ICD-10-CM | POA: Diagnosis not present

## 2017-11-18 DIAGNOSIS — L03115 Cellulitis of right lower limb: Secondary | ICD-10-CM | POA: Diagnosis not present

## 2017-12-01 DIAGNOSIS — I83202 Varicose veins of unspecified lower extremity with both ulcer of calf and inflammation: Secondary | ICD-10-CM | POA: Diagnosis not present

## 2017-12-08 DIAGNOSIS — I87321 Chronic venous hypertension (idiopathic) with inflammation of right lower extremity: Secondary | ICD-10-CM | POA: Diagnosis not present

## 2017-12-14 DIAGNOSIS — I83202 Varicose veins of unspecified lower extremity with both ulcer of calf and inflammation: Secondary | ICD-10-CM | POA: Diagnosis not present

## 2017-12-14 DIAGNOSIS — L97211 Non-pressure chronic ulcer of right calf limited to breakdown of skin: Secondary | ICD-10-CM | POA: Diagnosis not present

## 2017-12-14 DIAGNOSIS — I8311 Varicose veins of right lower extremity with inflammation: Secondary | ICD-10-CM | POA: Diagnosis not present

## 2017-12-21 DIAGNOSIS — L97211 Non-pressure chronic ulcer of right calf limited to breakdown of skin: Secondary | ICD-10-CM | POA: Diagnosis not present

## 2017-12-28 DIAGNOSIS — L97211 Non-pressure chronic ulcer of right calf limited to breakdown of skin: Secondary | ICD-10-CM | POA: Diagnosis not present

## 2018-01-04 DIAGNOSIS — L97211 Non-pressure chronic ulcer of right calf limited to breakdown of skin: Secondary | ICD-10-CM | POA: Diagnosis not present

## 2018-01-11 DIAGNOSIS — L97211 Non-pressure chronic ulcer of right calf limited to breakdown of skin: Secondary | ICD-10-CM | POA: Diagnosis not present

## 2018-05-17 DIAGNOSIS — F1190 Opioid use, unspecified, uncomplicated: Secondary | ICD-10-CM | POA: Diagnosis not present

## 2018-05-17 DIAGNOSIS — M150 Primary generalized (osteo)arthritis: Secondary | ICD-10-CM | POA: Diagnosis not present

## 2018-06-24 DIAGNOSIS — I83811 Varicose veins of right lower extremities with pain: Secondary | ICD-10-CM | POA: Diagnosis not present

## 2018-06-24 DIAGNOSIS — I8311 Varicose veins of right lower extremity with inflammation: Secondary | ICD-10-CM | POA: Diagnosis not present

## 2018-07-29 DIAGNOSIS — I8311 Varicose veins of right lower extremity with inflammation: Secondary | ICD-10-CM | POA: Diagnosis not present

## 2018-07-29 DIAGNOSIS — I83811 Varicose veins of right lower extremities with pain: Secondary | ICD-10-CM | POA: Diagnosis not present

## 2018-08-03 DIAGNOSIS — I8311 Varicose veins of right lower extremity with inflammation: Secondary | ICD-10-CM | POA: Diagnosis not present

## 2018-08-30 DIAGNOSIS — I83891 Varicose veins of right lower extremities with other complications: Secondary | ICD-10-CM | POA: Diagnosis not present

## 2018-08-30 DIAGNOSIS — I8311 Varicose veins of right lower extremity with inflammation: Secondary | ICD-10-CM | POA: Diagnosis not present

## 2018-09-13 DIAGNOSIS — I83811 Varicose veins of right lower extremities with pain: Secondary | ICD-10-CM | POA: Diagnosis not present

## 2018-09-13 DIAGNOSIS — I8311 Varicose veins of right lower extremity with inflammation: Secondary | ICD-10-CM | POA: Diagnosis not present

## 2018-09-27 DIAGNOSIS — I83891 Varicose veins of right lower extremities with other complications: Secondary | ICD-10-CM | POA: Diagnosis not present

## 2018-09-27 DIAGNOSIS — I8311 Varicose veins of right lower extremity with inflammation: Secondary | ICD-10-CM | POA: Diagnosis not present

## 2018-10-25 DIAGNOSIS — Z0000 Encounter for general adult medical examination without abnormal findings: Secondary | ICD-10-CM | POA: Diagnosis not present

## 2018-10-25 DIAGNOSIS — Z125 Encounter for screening for malignant neoplasm of prostate: Secondary | ICD-10-CM | POA: Diagnosis not present

## 2018-10-25 DIAGNOSIS — E782 Mixed hyperlipidemia: Secondary | ICD-10-CM | POA: Diagnosis not present

## 2018-11-01 DIAGNOSIS — L03115 Cellulitis of right lower limb: Secondary | ICD-10-CM | POA: Diagnosis not present

## 2018-11-01 DIAGNOSIS — I8311 Varicose veins of right lower extremity with inflammation: Secondary | ICD-10-CM | POA: Diagnosis not present

## 2018-11-08 DIAGNOSIS — Z0000 Encounter for general adult medical examination without abnormal findings: Secondary | ICD-10-CM | POA: Diagnosis not present

## 2018-11-08 DIAGNOSIS — R600 Localized edema: Secondary | ICD-10-CM | POA: Diagnosis not present

## 2018-11-08 DIAGNOSIS — E782 Mixed hyperlipidemia: Secondary | ICD-10-CM | POA: Diagnosis not present

## 2018-11-08 DIAGNOSIS — G4733 Obstructive sleep apnea (adult) (pediatric): Secondary | ICD-10-CM | POA: Diagnosis not present

## 2018-11-08 DIAGNOSIS — L600 Ingrowing nail: Secondary | ICD-10-CM | POA: Diagnosis not present

## 2018-11-08 DIAGNOSIS — L03115 Cellulitis of right lower limb: Secondary | ICD-10-CM | POA: Diagnosis not present

## 2018-11-08 DIAGNOSIS — F17200 Nicotine dependence, unspecified, uncomplicated: Secondary | ICD-10-CM | POA: Diagnosis not present

## 2018-12-17 DIAGNOSIS — I890 Lymphedema, not elsewhere classified: Secondary | ICD-10-CM | POA: Insufficient documentation

## 2018-12-28 DIAGNOSIS — I890 Lymphedema, not elsewhere classified: Secondary | ICD-10-CM | POA: Diagnosis not present

## 2019-01-12 DIAGNOSIS — I890 Lymphedema, not elsewhere classified: Secondary | ICD-10-CM | POA: Diagnosis not present

## 2019-02-02 DIAGNOSIS — F1120 Opioid dependence, uncomplicated: Secondary | ICD-10-CM | POA: Diagnosis not present

## 2019-02-15 ENCOUNTER — Ambulatory Visit (INDEPENDENT_AMBULATORY_CARE_PROVIDER_SITE_OTHER): Payer: BC Managed Care – PPO | Admitting: Podiatry

## 2019-02-15 ENCOUNTER — Other Ambulatory Visit: Payer: Self-pay

## 2019-02-15 ENCOUNTER — Ambulatory Visit (INDEPENDENT_AMBULATORY_CARE_PROVIDER_SITE_OTHER): Payer: BC Managed Care – PPO

## 2019-02-15 ENCOUNTER — Encounter: Payer: Self-pay | Admitting: Podiatry

## 2019-02-15 DIAGNOSIS — L603 Nail dystrophy: Secondary | ICD-10-CM | POA: Diagnosis not present

## 2019-02-15 DIAGNOSIS — M2041 Other hammer toe(s) (acquired), right foot: Secondary | ICD-10-CM | POA: Diagnosis not present

## 2019-02-15 DIAGNOSIS — M7661 Achilles tendinitis, right leg: Secondary | ICD-10-CM

## 2019-02-15 DIAGNOSIS — Q828 Other specified congenital malformations of skin: Secondary | ICD-10-CM

## 2019-02-15 DIAGNOSIS — M2042 Other hammer toe(s) (acquired), left foot: Secondary | ICD-10-CM

## 2019-02-15 DIAGNOSIS — M7662 Achilles tendinitis, left leg: Secondary | ICD-10-CM | POA: Diagnosis not present

## 2019-02-15 DIAGNOSIS — M76829 Posterior tibial tendinitis, unspecified leg: Secondary | ICD-10-CM

## 2019-02-15 DIAGNOSIS — L608 Other nail disorders: Secondary | ICD-10-CM | POA: Diagnosis not present

## 2019-02-16 NOTE — Progress Notes (Signed)
Subjective:  Patient ID: [name], male    DOB: 08-27-1965,  MRN: [id number] HPI Chief Complaint  Patient presents with  . Toe Pain    3rd toe right - tip of toe callused and sore, "sometimes the skin off that toes smells", tries to cut the skin off and files down   . Nail Problem    Toenails bilateral - concerned about toenail fungus   . NOTE    Limb length difference - "can you make the same?", wears compression socks and uses pump for lyphedema   . New Patient (Initial Visit)    54 y.o. male presents with the above complaint.   ROS: Denies fever chills nausea vomiting muscle aches pains calf pain back pain chest pain shortness of breath.  No past medical history on file. No past surgical history on file.  Current Outpatient Medications:  .  potassium chloride (KLOR-CON) 10 MEQ tablet, Take 10 mEq by mouth daily., Disp: , Rfl:  .  buprenorphine-naloxone (SUBOXONE) 8-2 mg SUBL SL tablet, Place 1 tablet under the tongue 2 (two) times daily with a meal., Disp: , Rfl:  .  furosemide (LASIX) 20 MG tablet, Take 20 mg by mouth daily., Disp: , Rfl:  .  naproxen (NAPROSYN) 500 MG tablet, Take 500 mg by mouth 2 (two) times daily as needed., Disp: , Rfl:   No Known Allergies Review of Systems Objective:  There were no vitals filed for this visit.  General: Well developed, nourished, in no acute distress, alert and oriented x3   Dermatological: Skin is warm, dry and supple bilateral. Nails x 10 are well maintained; remaining integument appears unremarkable at this time. There are no open sores, no preulcerative lesions, no rash or signs of infection present.  His toenails are thick yellow dystrophic clinically mycotic he has koli onychia with distal onycholysis.  Vascular: Dorsalis Pedis artery and Posterior Tibial artery pedal pulses are 2/4 bilateral with immedate capillary fill time. Pedal hair growth present. No varicosities and no lower extremity edema present bilateral.    Neruologic: Grossly intact via light touch bilateral. Vibratory intact via tuning fork bilateral. Protective threshold with Semmes Wienstein monofilament intact to all pedal sites bilateral. Patellar and Achilles deep tendon reflexes 2+ bilateral. No Babinski or clonus noted bilateral.   Musculoskeletal: No gross boney pedal deformities bilateral. No pain, crepitus, or limitation noted with foot and ankle range of motion bilateral. Muscular strength 5/5 in all groups tested bilateral.  Severe pes planus of the right foot with no inversion of the posterior tibial tendon.  Left foot is not flat has good inversion of the posterior tibial tendon.  He has a clicking and popping on range of motion of the subtalar joint which cannot even reach neutral position on the right foot heel is everted foot is flattened down.  Severe hammertoe deformities with distal clavi that appear to breakdown the skin.  Currently there is no signs of infection.  Gait: Unassisted, Nonantalgic.    Radiographs:  Radiographs of the left foot demonstrate an osseously mature individual without complications other than some mild hammertoe deformities.  Right foot however does demonstrate severe flattening of the foot with osteoarthritic changes of the subtalar joint and the talonavicular joint.  I do not see any fracture fragments however most likely the anterior process of the calcaneus had been fractured at some point.  As severe hammertoe deformities.  Assessment & Plan:   Assessment: Severe flatfoot deformity with hammertoe deformities resulting in digital ulcerations  bilateral.  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  And nail dystrophies 1 through 5 bilateral.  Plan: Samples of the nails and skin were taken today for pathologic evaluation.  I also provided him with buttress pads to hold the tips of the toes up.  I also discussed his deformities with [name] and [name] is going  to make him a set of orthotics.  If this fails surgery will be indicated.  Most likely request Dr. Amalia Hailey to do this.     [name] T. [place], [place]

## 2019-03-02 DIAGNOSIS — F1120 Opioid dependence, uncomplicated: Secondary | ICD-10-CM | POA: Diagnosis not present

## 2019-03-07 ENCOUNTER — Other Ambulatory Visit: Payer: Self-pay

## 2019-03-07 ENCOUNTER — Ambulatory Visit: Payer: BC Managed Care – PPO | Admitting: Orthotics

## 2019-03-07 DIAGNOSIS — M2042 Other hammer toe(s) (acquired), left foot: Secondary | ICD-10-CM

## 2019-03-07 DIAGNOSIS — M2041 Other hammer toe(s) (acquired), right foot: Secondary | ICD-10-CM

## 2019-03-07 DIAGNOSIS — Q828 Other specified congenital malformations of skin: Secondary | ICD-10-CM

## 2019-03-07 NOTE — Progress Notes (Signed)
Patient came in today to pick up custom made foot orthotics.  The goals were accomplished and the patient reported no dissatisfaction with said orthotics.  Patient was advised of breakin period and how to report any issues. 

## 2019-03-08 ENCOUNTER — Other Ambulatory Visit: Payer: BC Managed Care – PPO | Admitting: Orthotics

## 2019-03-17 ENCOUNTER — Ambulatory Visit: Payer: BC Managed Care – PPO | Admitting: Podiatry

## 2019-03-23 DIAGNOSIS — F1120 Opioid dependence, uncomplicated: Secondary | ICD-10-CM | POA: Diagnosis not present

## 2019-03-31 ENCOUNTER — Emergency Department (HOSPITAL_COMMUNITY)
Admission: EM | Admit: 2019-03-31 | Discharge: 2019-03-31 | Disposition: A | Discharge status: Home / Self Care | Payer: BC Managed Care – PPO | Attending: Emergency Medicine | Admitting: Emergency Medicine

## 2019-03-31 ENCOUNTER — Other Ambulatory Visit: Payer: Self-pay

## 2019-03-31 ENCOUNTER — Encounter (HOSPITAL_COMMUNITY): Payer: Self-pay | Admitting: *Deleted

## 2019-03-31 ENCOUNTER — Emergency Department (HOSPITAL_COMMUNITY): Payer: BC Managed Care – PPO

## 2019-03-31 DIAGNOSIS — L03031 Cellulitis of right toe: Secondary | ICD-10-CM | POA: Diagnosis not present

## 2019-03-31 DIAGNOSIS — L089 Local infection of the skin and subcutaneous tissue, unspecified: Secondary | ICD-10-CM

## 2019-03-31 DIAGNOSIS — F17210 Nicotine dependence, cigarettes, uncomplicated: Secondary | ICD-10-CM | POA: Diagnosis not present

## 2019-03-31 DIAGNOSIS — M79674 Pain in right toe(s): Secondary | ICD-10-CM | POA: Diagnosis not present

## 2019-03-31 LAB — BASIC METABOLIC PANEL
Anion gap: 9 (ref 5–15)
BUN: 16 mg/dL (ref 6–20)
CO2: 26 mmol/L (ref 22–32)
Calcium: 9.1 mg/dL (ref 8.9–10.3)
Chloride: 101 mmol/L (ref 98–111)
Creatinine, Ser: 0.89 mg/dL (ref 0.61–1.24)
GFR calc Af Amer: >60 mL/min (ref >60)
GFR calc non Af Amer: >60 mL/min (ref >60)
Glucose, Bld: 116 mg/dL — ABNORMAL HIGH (ref 70–99)
Potassium: 4.4 mmol/L (ref 3.5–5.1)
Sodium: 136 mmol/L (ref 135–145)

## 2019-03-31 LAB — CBC
HCT: 40.5 % (ref 39.0–52.0)
Hemoglobin: 13.8 g/dL (ref 13.0–17.0)
MCH: 30.9 pg (ref 26.0–34.0)
MCHC: 34.1 g/dL (ref 30.0–36.0)
MCV: 90.8 fL (ref 80.0–100.0)
Platelets: 251 10*3/uL (ref 150–400)
RBC: 4.46 MIL/uL (ref 4.22–5.81)
RDW: 12.3 % (ref 11.5–15.5)
WBC: 6.5 10*3/uL (ref 4.0–10.5)
nRBC: 0 % (ref 0.0–0.2)

## 2019-03-31 MED ORDER — IOHEXOL 300 MG/ML  SOLN
100.0000 mL | Freq: Once | INTRAMUSCULAR | Status: AC | PRN
  Administered 2019-03-31: 100 mL via INTRAVENOUS

## 2019-03-31 MED ORDER — DOXYCYCLINE HYCLATE 100 MG PO CAPS: 100.0000 mg | ORAL_CAPSULE | Freq: Two times a day (BID) | ORAL | 0 refills | Status: AC

## 2019-03-31 NOTE — Discharge Instructions (Addendum)
You were evaluated in the Emergency Department and after careful evaluation, we did not find any emergent condition requiring admission or further testing in the hospital.  Your exam/testing today is overall reassuring.  CT scan did not show signs of bone infection.  The skin and soft tissues of the toe do have infection.  Please take the antibiotic as directed and follow-up with your foot and ankle specialist.  As discussed, if the antibiotics do not improve the toe, you may need further testing or reevaluation.  Please return to the Emergency Department if you experience any worsening of your condition.  We encourage you to follow up with a primary care provider.  Thank you for allowing [place] to be a part of your care.

## 2019-03-31 NOTE — ED Provider Notes (Signed)
[facility] Emergency Department Provider Note MRN:  [id number]  Arrival date & time: 03/31/19     Chief Complaint   Toe Pain   History of Present Illness   [name] is a 54 y.o. year-old male with a history of lymphedema presenting to the ED with chief complaint of toe pain.  Worsening right great toe pain, redness, swelling for the past week.  Noticing some white discoloration over the past 24 hours.  Denies fever, pain is mild, no vomiting, no diarrhea, no chest pain, no shortness of breath, no abdominal pain, no other complaints.  Review of Systems  A complete 10 system review of systems was obtained and all systems are negative except as noted in the HPI and PMH.   Patient's Health History   History reviewed. No pertinent past medical history.  History reviewed. No pertinent surgical history.  No family history on file.  Social History   Socioeconomic History  . Marital status: Married    Spouse name: Not on file  . Number of children: Not on file  . Years of education: Not on file  . Highest education level: Not on file  Occupational History  . Not on file  Tobacco Use  . Smoking status: Current Every Day Smoker  . Smokeless tobacco: Never Used  Substance and Sexual Activity  . Alcohol use: Never  . Drug use: Not on file  . Sexual activity: Not on file  Other Topics Concern  . Not on file  Social History Narrative  . Not on file   Social Determinants of Health   Financial Resource Strain:   . Difficulty of Paying Living Expenses: Not on file  Food Insecurity:   . Worried About Charity fundraiser in the Last Year: Not on file  . Ran Out of Food in the Last Year: Not on file  Transportation Needs:   . Lack of Transportation (Medical): Not on file  . Lack of Transportation (Non-Medical): Not on file  Physical Activity:   . Days of Exercise per Week: Not on file  . Minutes of Exercise per Session: Not on file  Stress:   .  Feeling of Stress : Not on file  Social Connections:   . Frequency of Communication with Friends and Family: Not on file  . Frequency of Social Gatherings with Friends and Family: Not on file  . Attends Religious Services: Not on file  . Active Member of Clubs or Organizations: Not on file  . Attends Archivist Meetings: Not on file  . Marital Status: Not on file  Intimate Partner Violence:   . Fear of Current or Ex-Partner: Not on file  . Emotionally Abused: Not on file  . Physically Abused: Not on file  . Sexually Abused: Not on file     Physical Exam   Vitals:   03/31/19 0644  BP: (!) 142/88  Pulse: 82  Resp: (!) 22  Temp: 98.2 F (36.8 C)  SpO2: 100%    CONSTITUTIONAL: Well-appearing, NAD NEURO:  Alert and oriented x 3, no focal deficits EYES:  eyes equal and reactive ENT/NECK:  no LAD, no JVD CARDIO: Regular rate, well-perfused, normal S1 and S2 PULM:  CTAB no wheezing or rhonchi GI/GU:  normal bowel sounds, non-distended, non-tender MSK/SPINE: Edema and erythema of the right great toe with more distal white discoloration to the soft tissues SKIN:  no rash, atraumatic PSYCH:  Appropriate speech and behavior  *Additional and/or pertinent findings  included in MDM below  Diagnostic and Interventional Summary    EKG Interpretation  Date/Time:    Ventricular Rate:    PR Interval:    QRS Duration:   QT Interval:    QTC Calculation:   R Axis:     Text Interpretation:        Cardiac Monitoring Interpretation:  Labs Reviewed  BASIC METABOLIC PANEL - Abnormal; Notable for the following components:      Result Value   Glucose, Bld 116 (*)    All other components within normal limits  CBC    CT FOOT RIGHT W CONTRAST  Final Result      Medications  iohexol (OMNIPAQUE) 300 MG/ML solution 100 mL (100 mLs Intravenous Contrast Given 03/31/19 1258)     Procedures  /  Critical Care Procedures  ED Course and Medical Decision Making  I have reviewed  the triage vital signs, the nursing notes, and pertinent available records from the EMR.  Pertinent labs & imaging results that were available during my care of the patient were reviewed by me and considered in my medical decision making (see below for details).     Physical exam is suspicious for osteomyelitis, no systemic symptoms, will obtain CT imaging.  Patient feels well and would prefer outpatient management, has follow-up with foot and ankle this week.  1:40 PM update: CT scan revealing cellulitis but no signs of osteomyelitis.  Patient continues to look and feel well with no systemic symptoms of infection.  Appropriate for discharge with close follow-up, antibiotics.  Patient was informed that CT is good but is not perfect for excluding osteomyelitis and he may need MRI in the future if he does not improve.  Barth Kirks. Sedonia Small, [place] [email or web address]  Final Clinical Impressions(s) / ED Diagnoses     ICD-10-CM   1. Toe infection  L08.9     ED Discharge Orders         Ordered    doxycycline (VIBRAMYCIN) 100 MG capsule  2 times daily     03/31/19 1347           Discharge Instructions Discussed with and Provided to Patient:     Discharge Instructions     You were evaluated in the Emergency Department and after careful evaluation, we did not find any emergent condition requiring admission or further testing in the hospital.  Your exam/testing today is overall reassuring.  CT scan did not show signs of bone infection.  The skin and soft tissues of the toe do have infection.  Please take the antibiotic as directed and follow-up with your foot and ankle specialist.  As discussed, if the antibiotics do not improve the toe, you may need further testing or reevaluation.  Please return to the Emergency Department if you experience any worsening of your condition.  We encourage you to follow up with a primary care provider.   Thank you for allowing [place] to be a part of your care.       Maudie Flakes, MD 03/31/19 1349

## 2019-03-31 NOTE — ED Triage Notes (Signed)
The pt is c/o rt great toe pain  For one week red swollen he denies there being an ingrown toenail

## 2019-04-01 ENCOUNTER — Ambulatory Visit: Payer: BC Managed Care – PPO | Admitting: Podiatry

## 2019-04-06 DIAGNOSIS — F1120 Opioid dependence, uncomplicated: Secondary | ICD-10-CM | POA: Diagnosis not present

## 2019-04-19 ENCOUNTER — Ambulatory Visit: Payer: BC Managed Care – PPO | Admitting: Podiatry

## 2019-04-20 DIAGNOSIS — F1120 Opioid dependence, uncomplicated: Secondary | ICD-10-CM | POA: Diagnosis not present

## 2019-05-12 DIAGNOSIS — I83893 Varicose veins of bilateral lower extremities with other complications: Secondary | ICD-10-CM | POA: Diagnosis not present

## 2019-05-12 DIAGNOSIS — I890 Lymphedema, not elsewhere classified: Secondary | ICD-10-CM | POA: Diagnosis not present

## 2019-05-17 ENCOUNTER — Ambulatory Visit (INDEPENDENT_AMBULATORY_CARE_PROVIDER_SITE_OTHER): Payer: BC Managed Care – PPO | Admitting: Podiatry

## 2019-05-17 ENCOUNTER — Other Ambulatory Visit: Payer: Self-pay

## 2019-05-17 ENCOUNTER — Encounter: Payer: Self-pay | Admitting: Podiatry

## 2019-05-17 DIAGNOSIS — L97511 Non-pressure chronic ulcer of other part of right foot limited to breakdown of skin: Secondary | ICD-10-CM | POA: Diagnosis not present

## 2019-05-17 DIAGNOSIS — L603 Nail dystrophy: Secondary | ICD-10-CM

## 2019-05-17 DIAGNOSIS — Z79899 Other long term (current) drug therapy: Secondary | ICD-10-CM

## 2019-05-17 MED ORDER — MUPIROCIN 2 % EX OINT: TOPICAL_OINTMENT | CUTANEOUS | 1 refills | Status: DC

## 2019-05-17 NOTE — Progress Notes (Signed)
He presents today for follow-up of the ulcer to the third toe states that my big toe is the one that the problem is on now.  Objective: Vital signs are stable he is alert oriented x3 he only has a shoe off to his right foot.  Distal aspect of the third toe is gone on to heal uneventfully however the hallux right does demonstrate reactive hyperkeratosis and a superficial ulceration measuring approximately 0.8 cm in diameter.  Does not probe deep.  There is no fat visible.  There is no purulence no malodor.  I was able to debride all reactive hyperkeratotic tissue and tissue to bleeding.  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  Plan: Discussed etiology pathology and surgical therapies this point I debrided the reactive hyperkeratotic tissue debrided the bleed total bleeding seen all necrotic tissue placed padding and Silvadene cream.  Started him on Bactroban ointment and encouraged Epson salt warm water soaks.  And also dispensed a silicone pad.  Follow-up with him in about 3 to 4 weeks.

## 2019-06-08 DIAGNOSIS — F1120 Opioid dependence, uncomplicated: Secondary | ICD-10-CM | POA: Diagnosis not present

## 2019-06-16 ENCOUNTER — Ambulatory Visit: Payer: BC Managed Care – PPO | Admitting: Podiatry

## 2019-10-07 DIAGNOSIS — M1712 Unilateral primary osteoarthritis, left knee: Secondary | ICD-10-CM | POA: Diagnosis not present

## 2019-10-07 DIAGNOSIS — M25562 Pain in left knee: Secondary | ICD-10-CM | POA: Diagnosis not present

## 2019-10-07 DIAGNOSIS — R6889 Other general symptoms and signs: Secondary | ICD-10-CM | POA: Diagnosis not present

## 2019-10-07 DIAGNOSIS — E782 Mixed hyperlipidemia: Secondary | ICD-10-CM | POA: Diagnosis not present

## 2019-10-11 ENCOUNTER — Ambulatory Visit (INDEPENDENT_AMBULATORY_CARE_PROVIDER_SITE_OTHER): Payer: BC Managed Care – PPO | Admitting: Podiatry

## 2019-10-11 ENCOUNTER — Encounter: Payer: Self-pay | Admitting: Podiatry

## 2019-10-11 ENCOUNTER — Other Ambulatory Visit: Payer: Self-pay

## 2019-10-11 DIAGNOSIS — M25561 Pain in right knee: Secondary | ICD-10-CM | POA: Diagnosis not present

## 2019-10-11 DIAGNOSIS — M1711 Unilateral primary osteoarthritis, right knee: Secondary | ICD-10-CM | POA: Diagnosis not present

## 2019-10-11 DIAGNOSIS — M25562 Pain in left knee: Secondary | ICD-10-CM | POA: Diagnosis not present

## 2019-10-11 DIAGNOSIS — M1990 Unspecified osteoarthritis, unspecified site: Secondary | ICD-10-CM | POA: Diagnosis not present

## 2019-10-11 DIAGNOSIS — M25462 Effusion, left knee: Secondary | ICD-10-CM | POA: Diagnosis not present

## 2019-10-11 DIAGNOSIS — L97511 Non-pressure chronic ulcer of other part of right foot limited to breakdown of skin: Secondary | ICD-10-CM | POA: Diagnosis not present

## 2019-10-11 DIAGNOSIS — M1712 Unilateral primary osteoarthritis, left knee: Secondary | ICD-10-CM | POA: Diagnosis not present

## 2019-10-11 DIAGNOSIS — M25569 Pain in unspecified knee: Secondary | ICD-10-CM | POA: Diagnosis not present

## 2019-10-11 DIAGNOSIS — I890 Lymphedema, not elsewhere classified: Secondary | ICD-10-CM

## 2019-10-12 NOTE — Progress Notes (Signed)
He presents today for follow-up of ulceration to the distal aspect of the hallux right.  He states that he really never got 100% healed.  He denies fever chills nausea vomiting muscle aches pains calf pain back pain chest pain shortness of breath.  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  Patient states that he has compression hose and polyps.  States this did not come about until after vascular procedure right.  Objective: Vital signs are stable alert oriented x3 pulses are palpable.  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  Reactive hyperkeratotic tissue was present.  Once debrided today measures 0.4 cm and length after debridement with a chisel blade.  Does not probe deep to bone.  Granular tissue is noted epithelialization is occurring lymphedema is noted to this right leg.  Assessment: Lymphedema right lower extremity.  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  Plan: Debrided wound today demonstrated how to dress the toe.  May need to consider a tenotomy.  Let us start him on Medihoney.  He will dress daily follow-up with him in 4 to 6 weeks we are referring to the lymphedema clinic.

## 2019-10-12 NOTE — Addendum Note (Signed)
Addended by: Clovis Riley E on: 10/12/2019 10:27 AM   Modules accepted: Orders

## 2019-11-09 DIAGNOSIS — I10 Essential (primary) hypertension: Secondary | ICD-10-CM | POA: Diagnosis not present

## 2019-11-09 DIAGNOSIS — L0390 Cellulitis, unspecified: Secondary | ICD-10-CM | POA: Diagnosis not present

## 2019-11-09 DIAGNOSIS — R600 Localized edema: Secondary | ICD-10-CM | POA: Diagnosis not present

## 2019-11-09 DIAGNOSIS — F1120 Opioid dependence, uncomplicated: Secondary | ICD-10-CM | POA: Diagnosis not present

## 2019-11-29 ENCOUNTER — Ambulatory Visit: Payer: BC Managed Care – PPO | Admitting: Podiatry

## 2019-12-07 DIAGNOSIS — R600 Localized edema: Secondary | ICD-10-CM | POA: Diagnosis not present

## 2019-12-07 DIAGNOSIS — F1120 Opioid dependence, uncomplicated: Secondary | ICD-10-CM | POA: Diagnosis not present

## 2019-12-07 DIAGNOSIS — I10 Essential (primary) hypertension: Secondary | ICD-10-CM | POA: Diagnosis not present

## 2019-12-07 DIAGNOSIS — L0390 Cellulitis, unspecified: Secondary | ICD-10-CM | POA: Diagnosis not present

## 2019-12-13 DIAGNOSIS — M25462 Effusion, left knee: Secondary | ICD-10-CM | POA: Diagnosis not present

## 2019-12-13 DIAGNOSIS — M2170 Unequal limb length (acquired), unspecified site: Secondary | ICD-10-CM | POA: Diagnosis not present

## 2019-12-13 DIAGNOSIS — M25569 Pain in unspecified knee: Secondary | ICD-10-CM | POA: Diagnosis not present

## 2019-12-13 DIAGNOSIS — M1990 Unspecified osteoarthritis, unspecified site: Secondary | ICD-10-CM | POA: Diagnosis not present

## 2019-12-21 ENCOUNTER — Other Ambulatory Visit: Payer: Self-pay

## 2019-12-21 ENCOUNTER — Ambulatory Visit (INDEPENDENT_AMBULATORY_CARE_PROVIDER_SITE_OTHER): Payer: BC Managed Care – PPO | Admitting: Podiatry

## 2019-12-21 DIAGNOSIS — L97511 Non-pressure chronic ulcer of other part of right foot limited to breakdown of skin: Secondary | ICD-10-CM

## 2019-12-27 ENCOUNTER — Encounter: Payer: Self-pay | Admitting: Podiatry

## 2019-12-27 NOTE — Progress Notes (Signed)
Subjective:  Patient ID: [name], male    DOB: [birth year],  MRN: [id number]  Chief Complaint  Patient presents with  . Callouses    Right foot, PT stated that he thinks he had a callous and he pulled the skin off of it and it started to bleed and it left a place that he wants it to be checked     54 y.o. male presents with the above complaint.  Patient presents with concerns of right hallux distal tip ulceration.  Patient is being treated by Dr. Milinda Pointer for having history of previous ulceration.  He just wants to get it evaluated and checked to make sure that there is no new concern for infection.  He states that it started with a callus he pulled off the skin which led to bleeding we elected on place because he was afraid that he might be developing a new ulceration.  He denies any other acute complaints.  He denies any clinical signs of infection.   Review of Systems: Negative except as noted in the HPI. Denies N/V/F/Ch.  No past medical history on file.  Current Outpatient Medications:  .  baclofen (LIORESAL) 10 MG tablet, Take 10 mg by mouth 2 (two) times daily., Disp: , Rfl:  .  buprenorphine-naloxone (SUBOXONE) 8-2 mg SUBL SL tablet, Place 1 tablet under the tongue 2 (two) times daily with a meal., Disp: , Rfl:  .  cloNIDine (CATAPRES) 0.1 MG tablet, Take 0.1 mg by mouth at bedtime., Disp: , Rfl:  .  furosemide (LASIX) 20 MG tablet, Take 20 mg by mouth daily., Disp: , Rfl:  .  KLOR-CON M20 20 MEQ tablet, Take 20 mEq by mouth daily., Disp: , Rfl:  .  methocarbamol (ROBAXIN) 750 MG tablet, Take 750 mg by mouth every 4 (four) hours as needed., Disp: , Rfl:  .  mupirocin ointment (BACTROBAN) 2 %, Apply to wound after soaking BID, Disp: 30 g, Rfl: 1 .  naproxen (NAPROSYN) 500 MG tablet, Take 500 mg by mouth 2 (two) times daily as needed., Disp: , Rfl:  .  nicotine polacrilex (NICORETTE) 4 MG gum, SMARTSIG:1 Gum By Mouth Twice Daily PRN, Disp: , Rfl:  .  omeprazole (PRILOSEC) 20 MG  capsule, Take 20 mg by mouth every morning., Disp: , Rfl:  .  potassium chloride (KLOR-CON) 10 MEQ tablet, Take 10 mEq by mouth daily., Disp: , Rfl:   Social History   Tobacco Use  Smoking Status Current Every Day Smoker  Smokeless Tobacco Never Used    No Known Allergies Objective:  There were no vitals filed for this visit. There is no height or weight on file to calculate BMI. Constitutional Well developed. Well nourished.  Vascular Dorsalis pedis pulses palpable bilaterally. Posterior tibial pulses palpable bilaterally. Capillary refill normal to all digits.  No cyanosis or clubbing noted. Pedal hair growth normal.  Neurologic Normal speech. Oriented to person, place, and time. Epicritic sensation to light touch grossly present bilaterally.  Dermatologic  right hallux distal tip ulceration limited to the breakdown of the skin.  No clinical signs of infection noted.  It is partial-thickness ulceration.  Does not probe down to bone.  No malodor present.  Orthopedic: Normal joint ROM without pain or crepitus bilaterally. No visible deformities. No bony tenderness.   Radiographs: None Assessment:   1. Skin ulcer of toe of right foot, limited to breakdown of skin ([place])    Plan:  Patient was evaluated and treated and all questions answered.  Right hallux distal tip ulceration limited to the breakdown of the skin -I explained to the patient the etiology of ulceration versus treatment options were discussed.  Ultimately this has to do with pressure and his gait mechanics leading to ulceration and breakdown of the right hallux distal tip.  I discussed with the patient that offloading is the best way to take the pressure off of the tip of the ulcer and allow it to heal properly.  Patient states understanding.  I debrided the ulcer down today.  Medihoney was applied in standard technique. -He would like to follow-up with Dr. Milinda Pointer for continued local wound care No follow-ups on  file.

## 2019-12-28 DIAGNOSIS — I10 Essential (primary) hypertension: Secondary | ICD-10-CM | POA: Diagnosis not present

## 2019-12-28 DIAGNOSIS — F1120 Opioid dependence, uncomplicated: Secondary | ICD-10-CM | POA: Diagnosis not present

## 2019-12-28 DIAGNOSIS — L0390 Cellulitis, unspecified: Secondary | ICD-10-CM | POA: Diagnosis not present

## 2019-12-28 DIAGNOSIS — R600 Localized edema: Secondary | ICD-10-CM | POA: Diagnosis not present

## 2020-01-04 DIAGNOSIS — L0390 Cellulitis, unspecified: Secondary | ICD-10-CM | POA: Diagnosis not present

## 2020-01-04 DIAGNOSIS — I10 Essential (primary) hypertension: Secondary | ICD-10-CM | POA: Diagnosis not present

## 2020-01-04 DIAGNOSIS — F1120 Opioid dependence, uncomplicated: Secondary | ICD-10-CM | POA: Diagnosis not present

## 2020-01-04 DIAGNOSIS — R600 Localized edema: Secondary | ICD-10-CM | POA: Diagnosis not present

## 2020-01-05 ENCOUNTER — Ambulatory Visit: Payer: BC Managed Care – PPO | Admitting: Podiatry

## 2020-02-01 DIAGNOSIS — I10 Essential (primary) hypertension: Secondary | ICD-10-CM | POA: Diagnosis not present

## 2020-02-01 DIAGNOSIS — R600 Localized edema: Secondary | ICD-10-CM | POA: Diagnosis not present

## 2020-02-01 DIAGNOSIS — L0390 Cellulitis, unspecified: Secondary | ICD-10-CM | POA: Diagnosis not present

## 2020-02-01 DIAGNOSIS — F1120 Opioid dependence, uncomplicated: Secondary | ICD-10-CM | POA: Diagnosis not present

## 2020-02-15 DIAGNOSIS — R600 Localized edema: Secondary | ICD-10-CM | POA: Diagnosis not present

## 2020-02-15 DIAGNOSIS — L0390 Cellulitis, unspecified: Secondary | ICD-10-CM | POA: Diagnosis not present

## 2020-02-15 DIAGNOSIS — F1120 Opioid dependence, uncomplicated: Secondary | ICD-10-CM | POA: Diagnosis not present

## 2020-02-15 DIAGNOSIS — I10 Essential (primary) hypertension: Secondary | ICD-10-CM | POA: Diagnosis not present

## 2020-02-20 DIAGNOSIS — E782 Mixed hyperlipidemia: Secondary | ICD-10-CM | POA: Diagnosis not present

## 2020-02-20 DIAGNOSIS — Z0000 Encounter for general adult medical examination without abnormal findings: Secondary | ICD-10-CM | POA: Diagnosis not present

## 2020-02-27 DIAGNOSIS — Z0000 Encounter for general adult medical examination without abnormal findings: Secondary | ICD-10-CM | POA: Diagnosis not present

## 2020-02-27 DIAGNOSIS — M1990 Unspecified osteoarthritis, unspecified site: Secondary | ICD-10-CM | POA: Diagnosis not present

## 2020-02-27 DIAGNOSIS — R030 Elevated blood-pressure reading, without diagnosis of hypertension: Secondary | ICD-10-CM | POA: Diagnosis not present

## 2020-02-27 DIAGNOSIS — I890 Lymphedema, not elsewhere classified: Secondary | ICD-10-CM | POA: Diagnosis not present

## 2020-02-27 DIAGNOSIS — G4733 Obstructive sleep apnea (adult) (pediatric): Secondary | ICD-10-CM | POA: Diagnosis not present

## 2020-02-27 DIAGNOSIS — I872 Venous insufficiency (chronic) (peripheral): Secondary | ICD-10-CM | POA: Diagnosis not present

## 2020-02-27 DIAGNOSIS — R059 Cough, unspecified: Secondary | ICD-10-CM | POA: Diagnosis not present

## 2020-02-29 DIAGNOSIS — L0390 Cellulitis, unspecified: Secondary | ICD-10-CM | POA: Diagnosis not present

## 2020-02-29 DIAGNOSIS — F1120 Opioid dependence, uncomplicated: Secondary | ICD-10-CM | POA: Diagnosis not present

## 2020-02-29 DIAGNOSIS — R600 Localized edema: Secondary | ICD-10-CM | POA: Diagnosis not present

## 2020-02-29 DIAGNOSIS — I10 Essential (primary) hypertension: Secondary | ICD-10-CM | POA: Diagnosis not present

## 2020-03-14 DIAGNOSIS — F1120 Opioid dependence, uncomplicated: Secondary | ICD-10-CM | POA: Diagnosis not present

## 2020-03-14 DIAGNOSIS — L0390 Cellulitis, unspecified: Secondary | ICD-10-CM | POA: Diagnosis not present

## 2020-03-14 DIAGNOSIS — I10 Essential (primary) hypertension: Secondary | ICD-10-CM | POA: Diagnosis not present

## 2020-03-14 DIAGNOSIS — R600 Localized edema: Secondary | ICD-10-CM | POA: Diagnosis not present

## 2020-03-28 DIAGNOSIS — I10 Essential (primary) hypertension: Secondary | ICD-10-CM | POA: Diagnosis not present

## 2020-03-28 DIAGNOSIS — L0390 Cellulitis, unspecified: Secondary | ICD-10-CM | POA: Diagnosis not present

## 2020-03-28 DIAGNOSIS — F1120 Opioid dependence, uncomplicated: Secondary | ICD-10-CM | POA: Diagnosis not present

## 2020-03-28 DIAGNOSIS — R600 Localized edema: Secondary | ICD-10-CM | POA: Diagnosis not present

## 2020-04-11 DIAGNOSIS — I10 Essential (primary) hypertension: Secondary | ICD-10-CM | POA: Diagnosis not present

## 2020-04-11 DIAGNOSIS — L0390 Cellulitis, unspecified: Secondary | ICD-10-CM | POA: Diagnosis not present

## 2020-04-11 DIAGNOSIS — R600 Localized edema: Secondary | ICD-10-CM | POA: Diagnosis not present

## 2020-04-11 DIAGNOSIS — F1120 Opioid dependence, uncomplicated: Secondary | ICD-10-CM | POA: Diagnosis not present

## 2020-05-09 DIAGNOSIS — F1120 Opioid dependence, uncomplicated: Secondary | ICD-10-CM | POA: Diagnosis not present

## 2020-05-09 DIAGNOSIS — L0390 Cellulitis, unspecified: Secondary | ICD-10-CM | POA: Diagnosis not present

## 2020-05-09 DIAGNOSIS — R600 Localized edema: Secondary | ICD-10-CM | POA: Diagnosis not present

## 2020-05-09 DIAGNOSIS — I10 Essential (primary) hypertension: Secondary | ICD-10-CM | POA: Diagnosis not present

## 2020-05-23 DIAGNOSIS — I10 Essential (primary) hypertension: Secondary | ICD-10-CM | POA: Diagnosis not present

## 2020-05-23 DIAGNOSIS — F1120 Opioid dependence, uncomplicated: Secondary | ICD-10-CM | POA: Diagnosis not present

## 2020-05-23 DIAGNOSIS — L0390 Cellulitis, unspecified: Secondary | ICD-10-CM | POA: Diagnosis not present

## 2020-05-23 DIAGNOSIS — R600 Localized edema: Secondary | ICD-10-CM | POA: Diagnosis not present

## 2020-06-06 DIAGNOSIS — R600 Localized edema: Secondary | ICD-10-CM | POA: Diagnosis not present

## 2020-06-06 DIAGNOSIS — F1120 Opioid dependence, uncomplicated: Secondary | ICD-10-CM | POA: Diagnosis not present

## 2020-06-06 DIAGNOSIS — L0390 Cellulitis, unspecified: Secondary | ICD-10-CM | POA: Diagnosis not present

## 2020-06-06 DIAGNOSIS — I10 Essential (primary) hypertension: Secondary | ICD-10-CM | POA: Diagnosis not present

## 2020-06-20 DIAGNOSIS — R600 Localized edema: Secondary | ICD-10-CM | POA: Diagnosis not present

## 2020-06-20 DIAGNOSIS — I10 Essential (primary) hypertension: Secondary | ICD-10-CM | POA: Diagnosis not present

## 2020-06-20 DIAGNOSIS — L0390 Cellulitis, unspecified: Secondary | ICD-10-CM | POA: Diagnosis not present

## 2020-06-20 DIAGNOSIS — F1120 Opioid dependence, uncomplicated: Secondary | ICD-10-CM | POA: Diagnosis not present

## 2020-07-18 DIAGNOSIS — F1120 Opioid dependence, uncomplicated: Secondary | ICD-10-CM | POA: Diagnosis not present

## 2020-07-18 DIAGNOSIS — L0390 Cellulitis, unspecified: Secondary | ICD-10-CM | POA: Diagnosis not present

## 2020-07-18 DIAGNOSIS — R600 Localized edema: Secondary | ICD-10-CM | POA: Diagnosis not present

## 2020-07-18 DIAGNOSIS — I10 Essential (primary) hypertension: Secondary | ICD-10-CM | POA: Diagnosis not present

## 2020-08-08 DIAGNOSIS — F1120 Opioid dependence, uncomplicated: Secondary | ICD-10-CM | POA: Diagnosis not present

## 2020-08-08 DIAGNOSIS — L0390 Cellulitis, unspecified: Secondary | ICD-10-CM | POA: Diagnosis not present

## 2020-08-08 DIAGNOSIS — R600 Localized edema: Secondary | ICD-10-CM | POA: Diagnosis not present

## 2020-08-08 DIAGNOSIS — I10 Essential (primary) hypertension: Secondary | ICD-10-CM | POA: Diagnosis not present

## 2020-08-22 DIAGNOSIS — F1120 Opioid dependence, uncomplicated: Secondary | ICD-10-CM | POA: Diagnosis not present

## 2020-08-22 DIAGNOSIS — L0390 Cellulitis, unspecified: Secondary | ICD-10-CM | POA: Diagnosis not present

## 2020-08-22 DIAGNOSIS — R600 Localized edema: Secondary | ICD-10-CM | POA: Diagnosis not present

## 2020-08-22 DIAGNOSIS — I10 Essential (primary) hypertension: Secondary | ICD-10-CM | POA: Diagnosis not present

## 2020-08-30 DIAGNOSIS — I890 Lymphedema, not elsewhere classified: Secondary | ICD-10-CM | POA: Diagnosis not present

## 2020-08-30 DIAGNOSIS — I8312 Varicose veins of left lower extremity with inflammation: Secondary | ICD-10-CM | POA: Diagnosis not present

## 2020-08-30 DIAGNOSIS — I8311 Varicose veins of right lower extremity with inflammation: Secondary | ICD-10-CM | POA: Diagnosis not present

## 2020-09-05 DIAGNOSIS — F1120 Opioid dependence, uncomplicated: Secondary | ICD-10-CM | POA: Diagnosis not present

## 2020-09-05 DIAGNOSIS — I10 Essential (primary) hypertension: Secondary | ICD-10-CM | POA: Diagnosis not present

## 2020-09-05 DIAGNOSIS — R600 Localized edema: Secondary | ICD-10-CM | POA: Diagnosis not present

## 2020-09-05 DIAGNOSIS — L0390 Cellulitis, unspecified: Secondary | ICD-10-CM | POA: Diagnosis not present

## 2020-09-06 ENCOUNTER — Other Ambulatory Visit: Payer: Self-pay

## 2020-09-06 ENCOUNTER — Encounter: Payer: Self-pay | Admitting: Podiatry

## 2020-09-06 ENCOUNTER — Ambulatory Visit (INDEPENDENT_AMBULATORY_CARE_PROVIDER_SITE_OTHER): Payer: BC Managed Care – PPO | Admitting: Podiatry

## 2020-09-06 DIAGNOSIS — L97521 Non-pressure chronic ulcer of other part of left foot limited to breakdown of skin: Secondary | ICD-10-CM | POA: Diagnosis not present

## 2020-09-06 MED ORDER — AMOXICILLIN-POT CLAVULANATE 875-125 MG PO TABS: 1.0000 | ORAL_TABLET | Freq: Two times a day (BID) | ORAL | 0 refills | Status: DC

## 2020-09-07 NOTE — Progress Notes (Signed)
He presents today after having not seen him for years chief complaint of pain to the plantar forefoot callused area he says he is followed with a grinder a week or so ago and the skin peeled up and peeled off.  He says now there is a wound there there is still some drainage but it seems to be doing better than it was he has been using the Medihoney and Hibiclens.  He denies fever chills nausea vomiting muscle aches pains calf pain back pain chest pain shortness of breath.  Objective: Vital signs are stable he is alert and oriented x3 pulses are palpable left but barely so.  Considerable venous insufficiency to the bilateral lower extremities.  Hammertoe deformities are noted bilateral he has distal clavi with reactive hyperkeratoses that are not open.  A superficial ulceration to the forefoot subfourth metatarsal area left foot.  This was debrided today measuring initially about 3 mm in diameter now measuring about 0.7 cm in diameter.  No purulence some of the granulation tissue that was present was then sharply resected.  There was some bleeding but not a lot.  Assessment: Ulceration skin left cannot rule out peripheral vascular disease as well  Plan: At this point I would like to start him on Augmentin twice daily I trimmed and debrided the ulcer I recommended that he utilize his Darco shoe that he has at home daily he understands this and is amenable to it.  Also I would like to see about getting ABIs for him I will follow-up with him in 2 weeks.

## 2020-09-17 DIAGNOSIS — L0390 Cellulitis, unspecified: Secondary | ICD-10-CM | POA: Diagnosis not present

## 2020-09-17 DIAGNOSIS — I10 Essential (primary) hypertension: Secondary | ICD-10-CM | POA: Diagnosis not present

## 2020-09-17 DIAGNOSIS — I739 Peripheral vascular disease, unspecified: Secondary | ICD-10-CM | POA: Diagnosis not present

## 2020-09-17 DIAGNOSIS — F1120 Opioid dependence, uncomplicated: Secondary | ICD-10-CM | POA: Diagnosis not present

## 2020-09-18 ENCOUNTER — Ambulatory Visit (HOSPITAL_COMMUNITY)
Admission: RE | Admit: 2020-09-18 | Discharge: 2020-09-18 | Disposition: A | Discharge status: Home / Self Care | Payer: BC Managed Care – PPO | Source: Ambulatory Visit | Attending: Podiatry | Admitting: Podiatry

## 2020-09-18 ENCOUNTER — Other Ambulatory Visit: Payer: Self-pay

## 2020-09-18 DIAGNOSIS — F1120 Opioid dependence, uncomplicated: Secondary | ICD-10-CM | POA: Diagnosis not present

## 2020-09-18 DIAGNOSIS — L97521 Non-pressure chronic ulcer of other part of left foot limited to breakdown of skin: Secondary | ICD-10-CM | POA: Insufficient documentation

## 2020-09-20 ENCOUNTER — Encounter: Payer: Self-pay | Admitting: *Deleted

## 2020-09-20 ENCOUNTER — Ambulatory Visit: Payer: BC Managed Care – PPO | Admitting: Podiatry

## 2020-09-20 ENCOUNTER — Telehealth: Payer: Self-pay | Admitting: *Deleted

## 2020-09-20 NOTE — Telephone Encounter (Signed)
-----   Message from Garrel Ridgel, [place] sent at 09/19/2020  6:56 AM EDT ----- ABIs are normal

## 2020-09-20 NOTE — Telephone Encounter (Signed)
This encounter was created in error - please disregard.

## 2020-10-02 ENCOUNTER — Ambulatory Visit (INDEPENDENT_AMBULATORY_CARE_PROVIDER_SITE_OTHER): Payer: BC Managed Care – PPO | Admitting: Podiatry

## 2020-10-02 ENCOUNTER — Other Ambulatory Visit: Payer: Self-pay

## 2020-10-02 ENCOUNTER — Encounter: Payer: Self-pay | Admitting: Podiatry

## 2020-10-02 DIAGNOSIS — L97521 Non-pressure chronic ulcer of other part of left foot limited to breakdown of skin: Secondary | ICD-10-CM

## 2020-10-02 NOTE — Progress Notes (Signed)
He presents today for follow-up of his ulceration plantar aspect left foot.  His ABIs were also performed.  Objective: ABIs were within normal limits.  His ulceration is gone on to heal uneventfully mild reactive hyperkeratotic tissue is present.  Assessment: Well-healing ulceration plantar aspect left foot normal ABIs.  Plan: I went ahead and debrided the reactive hyperkeratotic tissue no open lesions or wounds noted.

## 2020-10-16 DIAGNOSIS — I8311 Varicose veins of right lower extremity with inflammation: Secondary | ICD-10-CM | POA: Diagnosis not present

## 2020-10-16 DIAGNOSIS — I8312 Varicose veins of left lower extremity with inflammation: Secondary | ICD-10-CM | POA: Diagnosis not present

## 2020-10-16 DIAGNOSIS — I890 Lymphedema, not elsewhere classified: Secondary | ICD-10-CM | POA: Diagnosis not present

## 2020-10-28 IMAGING — CT CT FOOT*R* W/CM
3 of 7 series · 12 of 35 positions shown, 13 images · IV contrast (agent unspecified)
Comparison: Radiographs 02/15/2019

CONTRAST:  100mL OMNIPAQUE IOHEXOL 300 MG/ML  SOLN

CLINICAL DATA: Pain, swelling and redness involving the right great
toe.

EXAM:
CT OF THE LOWER RIGHT EXTREMITY WITH CONTRAST
TECHNIQUE: Multidetector CT imaging of the lower right extremity was performed
according to the standard protocol following intravenous contrast
administration.

[Series 9: lower ext cor bone · coronal · 0.30mm/px · 3 of 202 slices shown]
[im 42/202  bone]
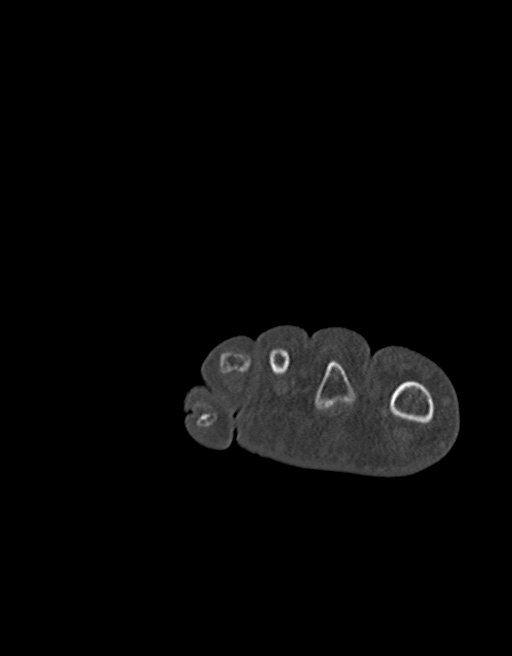
[im 96/202  bone]
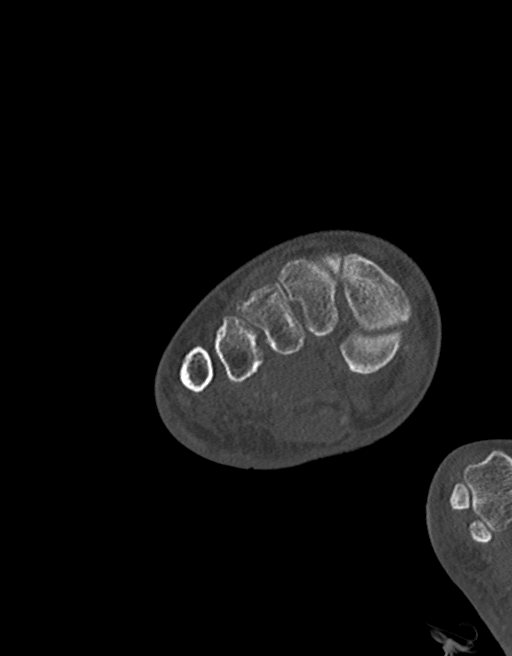
[im 150/202  bone]
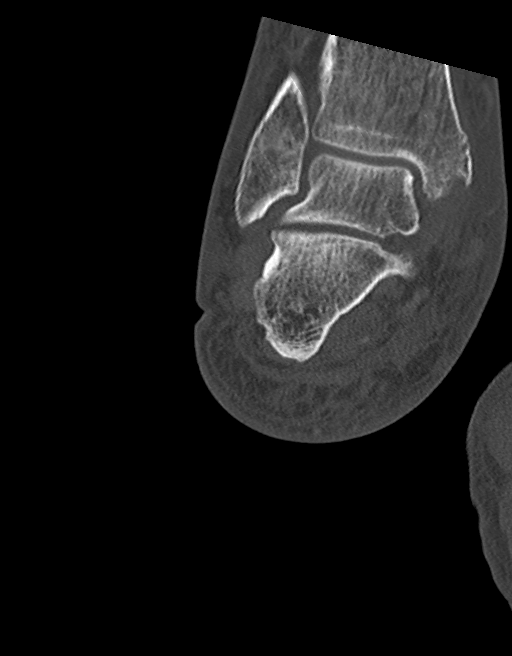

[Series 10: lower ext sag bone · sagittal · 0.35mm/px · 6 of 121 slices shown]
[im 45/121  bone]
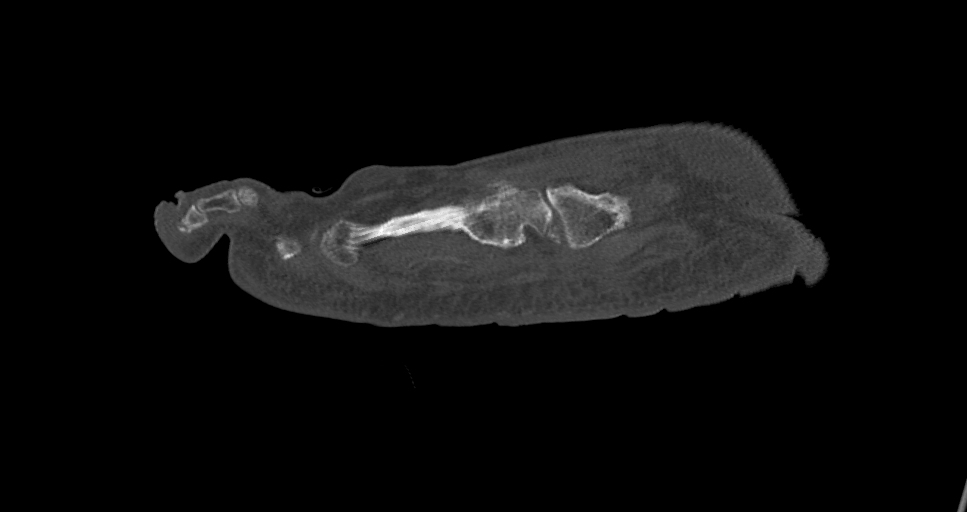
[im 56/121  bone]
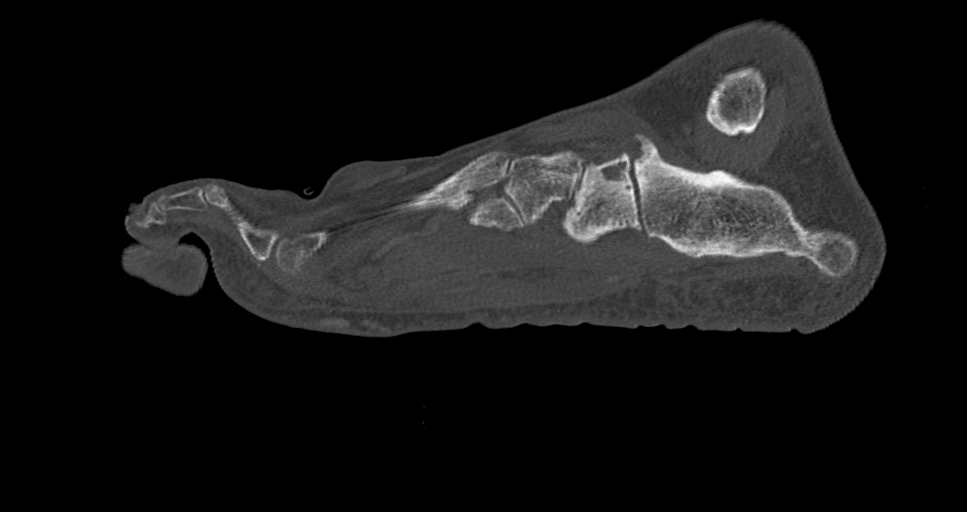
[im 66/121  soft-tissue]
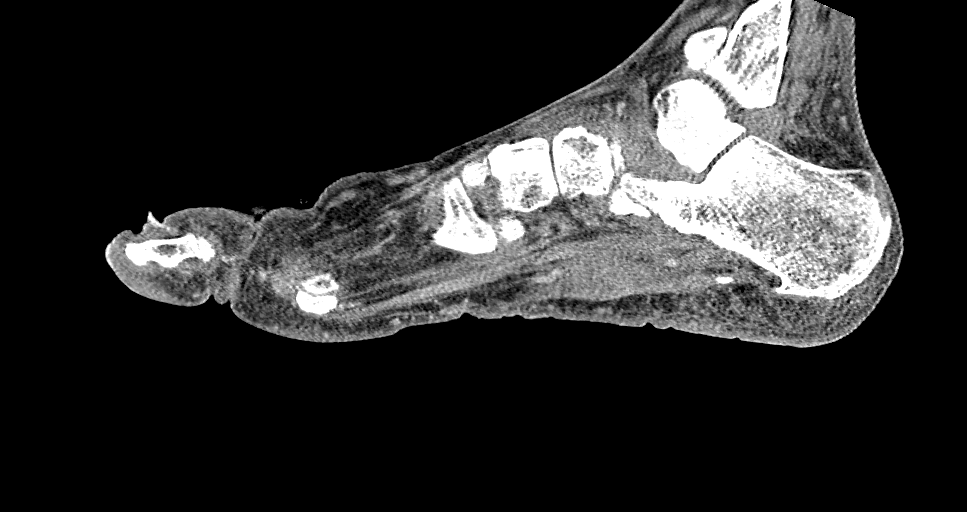
[im 67/121  bone]
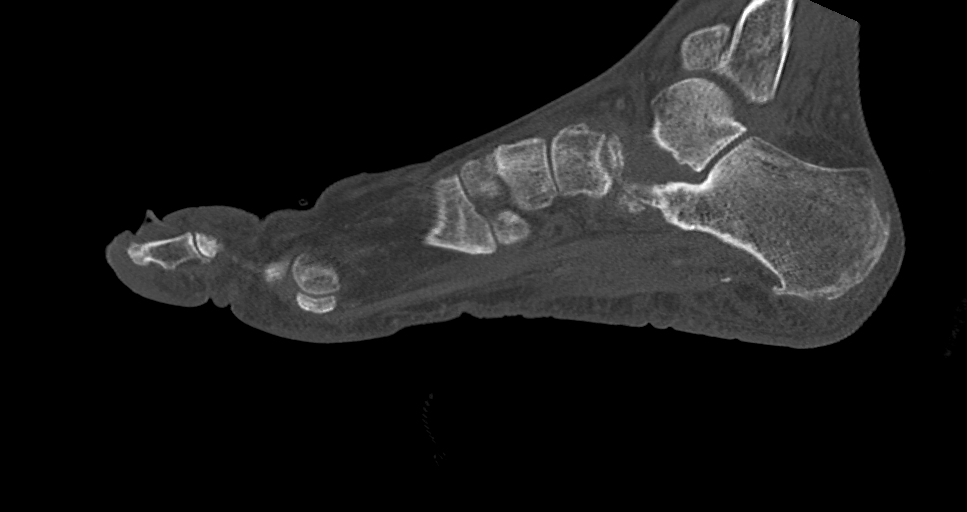
[im 77/121  bone]
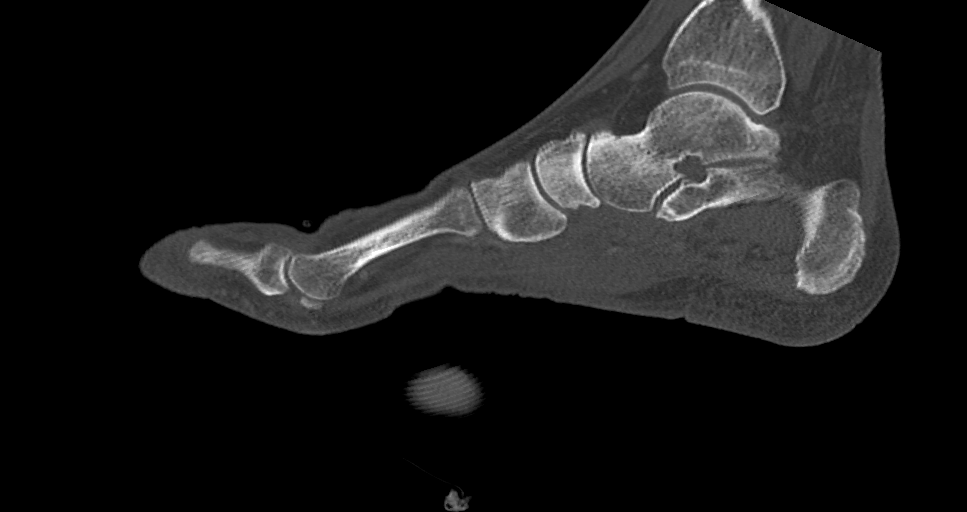
[im 88/121  bone]
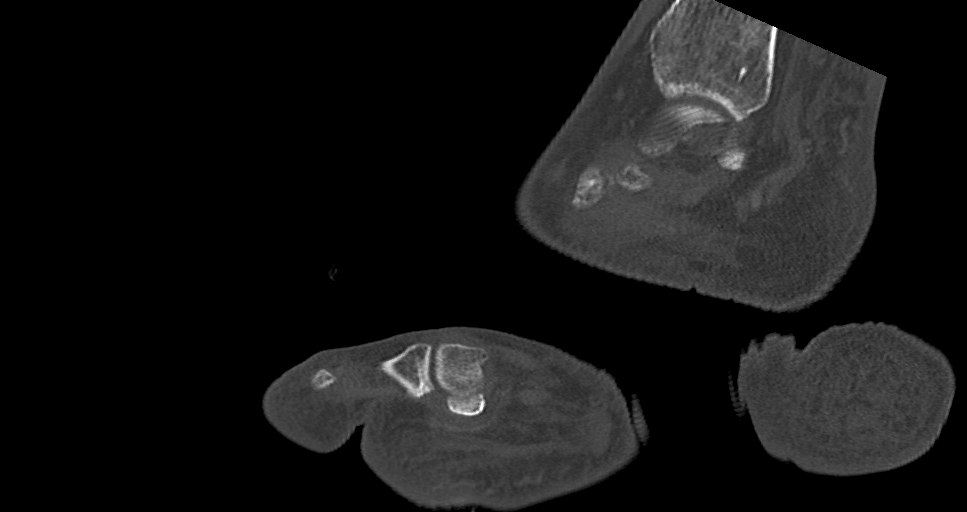

[Series 13: lower ext 1.5 st · axial · 0.57mm/px · z∈[-1266,-1167]mm · 3 of 134 slices shown, 4 images]
[im 34/134  soft-tissue]
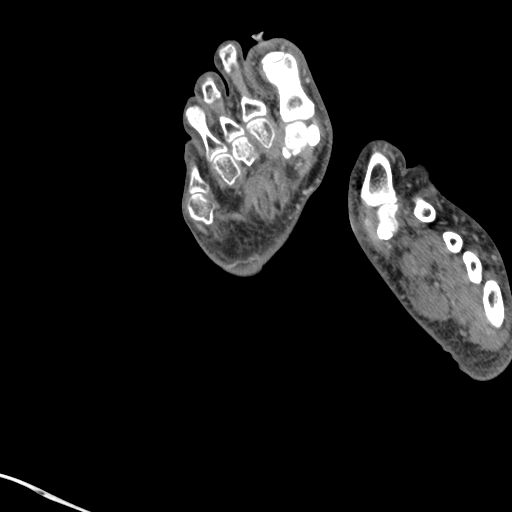
[im 34/134  bone]
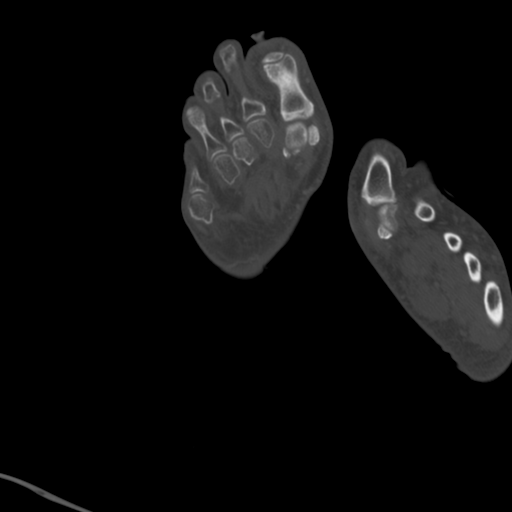
[im 67/134  bone]
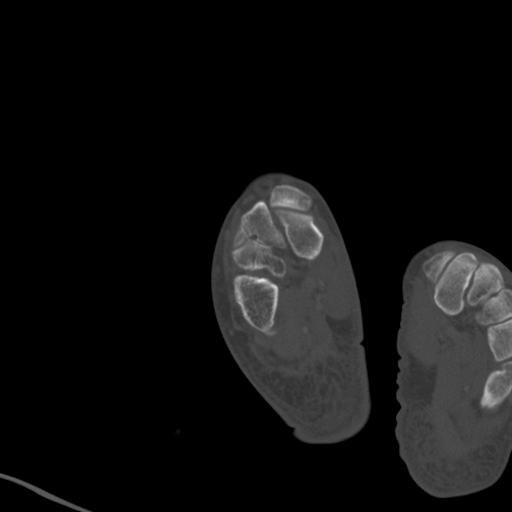
[im 100/134  bone]
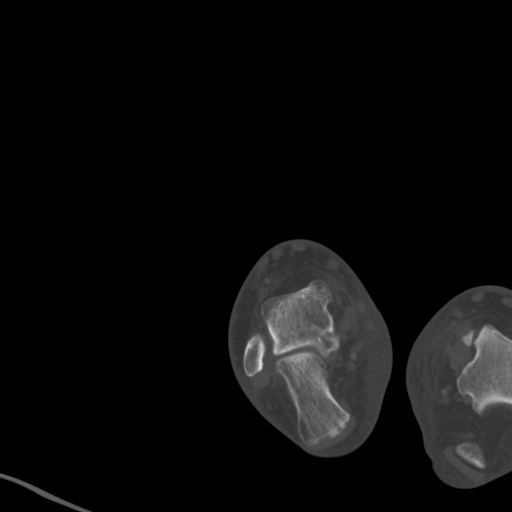

[12 of 35 positions shown; findings below may reference images not displayed]

FINDINGS: Moderate soft tissue swelling involving the great toe, most notably
along the tip of the toe and along the medial aspect. No discrete
rim enhancing fluid collection to suggest a drainable abscess. No
definite destructive bony changes to suggest osteomyelitis. No CT
findings suspicious for septic arthritis.

Mild diffuse changes suggesting cellulitis but I do not see any
obvious CT findings for myofasciitis or pyomyositis.

Moderate midfoot degenerative changes but no fractures or evidence
of AVN. The tibiotalar and subtalar joints are maintained. Mild
degenerative changes. Some calcifications are noted along the
plantar fascia.
IMPRESSION: 1. Cellulitis involving the great toe but no findings for drainable
soft tissue abscess, myofasciitis or pyomyositis.
2. No CT findings to suggest septic arthritis or osteomyelitis. If
symptoms persist or worsen recommend MRI imaging without and with
contrast for further evaluation.
3. Moderate midfoot degenerative changes but no acute bony findings.

## 2020-10-30 DIAGNOSIS — I8311 Varicose veins of right lower extremity with inflammation: Secondary | ICD-10-CM | POA: Diagnosis not present

## 2020-10-30 DIAGNOSIS — I8312 Varicose veins of left lower extremity with inflammation: Secondary | ICD-10-CM | POA: Diagnosis not present

## 2020-10-30 DIAGNOSIS — I83893 Varicose veins of bilateral lower extremities with other complications: Secondary | ICD-10-CM | POA: Diagnosis not present

## 2020-10-31 DIAGNOSIS — F1120 Opioid dependence, uncomplicated: Secondary | ICD-10-CM | POA: Diagnosis not present

## 2020-11-14 DIAGNOSIS — I10 Essential (primary) hypertension: Secondary | ICD-10-CM | POA: Diagnosis not present

## 2020-11-14 DIAGNOSIS — R600 Localized edema: Secondary | ICD-10-CM | POA: Diagnosis not present

## 2020-11-14 DIAGNOSIS — L0390 Cellulitis, unspecified: Secondary | ICD-10-CM | POA: Diagnosis not present

## 2020-11-14 DIAGNOSIS — F1120 Opioid dependence, uncomplicated: Secondary | ICD-10-CM | POA: Diagnosis not present

## 2020-12-05 DIAGNOSIS — F1120 Opioid dependence, uncomplicated: Secondary | ICD-10-CM | POA: Diagnosis not present

## 2020-12-12 DIAGNOSIS — R600 Localized edema: Secondary | ICD-10-CM | POA: Diagnosis not present

## 2020-12-12 DIAGNOSIS — F1120 Opioid dependence, uncomplicated: Secondary | ICD-10-CM | POA: Diagnosis not present

## 2020-12-12 DIAGNOSIS — L0390 Cellulitis, unspecified: Secondary | ICD-10-CM | POA: Diagnosis not present

## 2020-12-12 DIAGNOSIS — I10 Essential (primary) hypertension: Secondary | ICD-10-CM | POA: Diagnosis not present

## 2020-12-26 DIAGNOSIS — F1120 Opioid dependence, uncomplicated: Secondary | ICD-10-CM | POA: Diagnosis not present

## 2021-01-09 DIAGNOSIS — M549 Dorsalgia, unspecified: Secondary | ICD-10-CM | POA: Diagnosis not present

## 2021-01-09 DIAGNOSIS — R600 Localized edema: Secondary | ICD-10-CM | POA: Diagnosis not present

## 2021-01-09 DIAGNOSIS — F1120 Opioid dependence, uncomplicated: Secondary | ICD-10-CM | POA: Diagnosis not present

## 2021-01-09 DIAGNOSIS — I10 Essential (primary) hypertension: Secondary | ICD-10-CM | POA: Diagnosis not present

## 2021-01-23 DIAGNOSIS — F1120 Opioid dependence, uncomplicated: Secondary | ICD-10-CM | POA: Diagnosis not present

## 2021-02-01 DIAGNOSIS — L84 Corns and callosities: Secondary | ICD-10-CM | POA: Diagnosis not present

## 2021-02-01 DIAGNOSIS — R600 Localized edema: Secondary | ICD-10-CM | POA: Diagnosis not present

## 2021-02-01 DIAGNOSIS — L03116 Cellulitis of left lower limb: Secondary | ICD-10-CM | POA: Diagnosis not present

## 2021-02-05 ENCOUNTER — Other Ambulatory Visit: Payer: Self-pay

## 2021-02-05 ENCOUNTER — Ambulatory Visit (INDEPENDENT_AMBULATORY_CARE_PROVIDER_SITE_OTHER): Payer: BC Managed Care – PPO | Admitting: Podiatry

## 2021-02-05 ENCOUNTER — Encounter: Payer: Self-pay | Admitting: Podiatry

## 2021-02-05 DIAGNOSIS — D2371 Other benign neoplasm of skin of right lower limb, including hip: Secondary | ICD-10-CM | POA: Diagnosis not present

## 2021-02-05 DIAGNOSIS — D2372 Other benign neoplasm of skin of left lower limb, including hip: Secondary | ICD-10-CM

## 2021-02-05 DIAGNOSIS — M79676 Pain in unspecified toe(s): Secondary | ICD-10-CM | POA: Diagnosis not present

## 2021-02-05 DIAGNOSIS — L97521 Non-pressure chronic ulcer of other part of left foot limited to breakdown of skin: Secondary | ICD-10-CM

## 2021-02-05 DIAGNOSIS — B351 Tinea unguium: Secondary | ICD-10-CM | POA: Diagnosis not present

## 2021-02-05 MED ORDER — DOXYCYCLINE HYCLATE 100 MG PO TABS: 100.0000 mg | ORAL_TABLET | Freq: Two times a day (BID) | ORAL | 0 refills | Status: DC

## 2021-02-05 NOTE — Progress Notes (Signed)
He presents today for follow-up of IM plantar ulceration.  He states that he had been debriding this with a Dremel type instrument following the callus occasionally he started to notice that the area looked like he had a blood blister under it.  I states that this was in mid December.  His foot then become red and swollen across the top and was exquisitely painful across the top.  Saw his primary care provider who then put him on doxycycline.  Objective: Vital signs are stable he is alert orient x3 pulses are palpable.  Left foot still demonstrates ecchymosis and some cellulitis to the level of the midfoot.  He has some flexible hammertoe deformities of the left foot with distal clavi however the causative lesion was of the plantar aspect beneath the third metatarsal head with a reactive hyper keratoma and a blood blister that had dried.  This reactive hyperkeratosis was debrided demonstrated an ulceration measuring 2 mm x 4 mm and a central macerated area.  There is no purulence and no malodor associated with this.  He also had long thick yellow dystrophic clinically mycotic nails.  Assessment: Ulceration plantar aspect forefoot left with cellulitis dorsal aspect.  Pain limb secondary to onychomycosis and benign skin lesions.  Plan: Debridement of benign skin lesions debridement of toenails 1 through 5 bilaterally.  Debridement of ulceration today.  Start him back on his Medihoney dressing this and wearing a Darco shoe.  I did this for him today.  I also represcribed his doxycycline and will follow-up with him in 2 weeks provided him with information regarding infection explaining that should he develop fever chills nausea vomiting or the streaking get worse he is to go to the hospital for admission.  He understands and is amenable to it.  I would like to follow-up with him once this is completely healed and performed flexor tenotomy's to toes 2 through 4 of the left foot.  I think this would benefit him  greatly from the distal clavi as well as the chronic ulcerative lesion.

## 2021-02-06 DIAGNOSIS — I10 Essential (primary) hypertension: Secondary | ICD-10-CM | POA: Diagnosis not present

## 2021-02-06 DIAGNOSIS — F1120 Opioid dependence, uncomplicated: Secondary | ICD-10-CM | POA: Diagnosis not present

## 2021-02-06 DIAGNOSIS — R600 Localized edema: Secondary | ICD-10-CM | POA: Diagnosis not present

## 2021-02-06 DIAGNOSIS — M549 Dorsalgia, unspecified: Secondary | ICD-10-CM | POA: Diagnosis not present

## 2021-02-19 ENCOUNTER — Encounter: Payer: Self-pay | Admitting: Podiatry

## 2021-02-19 ENCOUNTER — Other Ambulatory Visit: Payer: Self-pay

## 2021-02-19 ENCOUNTER — Ambulatory Visit (INDEPENDENT_AMBULATORY_CARE_PROVIDER_SITE_OTHER): Payer: BC Managed Care – PPO | Admitting: Podiatry

## 2021-02-19 DIAGNOSIS — L97521 Non-pressure chronic ulcer of other part of left foot limited to breakdown of skin: Secondary | ICD-10-CM | POA: Diagnosis not present

## 2021-02-19 DIAGNOSIS — Z0000 Encounter for general adult medical examination without abnormal findings: Secondary | ICD-10-CM | POA: Insufficient documentation

## 2021-02-19 DIAGNOSIS — I872 Venous insufficiency (chronic) (peripheral): Secondary | ICD-10-CM

## 2021-02-19 DIAGNOSIS — M205X2 Other deformities of toe(s) (acquired), left foot: Secondary | ICD-10-CM | POA: Diagnosis not present

## 2021-02-19 DIAGNOSIS — R600 Localized edema: Secondary | ICD-10-CM | POA: Insufficient documentation

## 2021-02-19 DIAGNOSIS — R030 Elevated blood-pressure reading, without diagnosis of hypertension: Secondary | ICD-10-CM | POA: Insufficient documentation

## 2021-02-19 DIAGNOSIS — E782 Mixed hyperlipidemia: Secondary | ICD-10-CM | POA: Insufficient documentation

## 2021-02-19 DIAGNOSIS — M205X9 Other deformities of toe(s) (acquired), unspecified foot: Secondary | ICD-10-CM

## 2021-02-19 DIAGNOSIS — G4733 Obstructive sleep apnea (adult) (pediatric): Secondary | ICD-10-CM

## 2021-02-19 DIAGNOSIS — F1110 Opioid abuse, uncomplicated: Secondary | ICD-10-CM

## 2021-02-19 DIAGNOSIS — M2170 Unequal limb length (acquired), unspecified site: Secondary | ICD-10-CM

## 2021-02-19 HISTORY — DX: Unequal limb length (acquired), unspecified site: M21.70

## 2021-02-19 HISTORY — DX: Obstructive sleep apnea (adult) (pediatric): G47.33

## 2021-02-19 HISTORY — DX: Mixed hyperlipidemia: E78.2

## 2021-02-19 HISTORY — DX: Elevated blood-pressure reading, without diagnosis of hypertension: R03.0

## 2021-02-19 HISTORY — DX: Localized edema: R60.0

## 2021-02-19 HISTORY — DX: Venous insufficiency (chronic) (peripheral): I87.2

## 2021-02-19 HISTORY — DX: Opioid abuse, uncomplicated: F11.10

## 2021-02-19 NOTE — Patient Instructions (Signed)
Leave bandage in place and dry for 4 days, then remove. You may wash foot normally after removal of bandage. DO NOT SOAK FOOT! Dry completely afterwards and may use a bandaid over incision if needed. We will follow up with you in 1 weeks for recheck.  

## 2021-02-19 NOTE — Progress Notes (Signed)
He presents today for follow-up of his ulceration left toes he says they are doing much better.  He would like to consider the tenotomy's today.  Objective: Flexible hammertoe deformities with incompletely healed ulceration plantar aspect of the forefoot left as well as the distal aspect of the second third and fourth toes of the left foot.  Assessment: Flexible hammertoe deformities with distal clavi that chronically ulcerate.  Completely healed at this point.  Plan: At this point we performed a flexor tenotomy bilaterally today visit performed after local anesthetic was administered to the toes in question.  They were prepped and draped in normal sterile fashion a small stab incision was made with an 11 blade transecting the long flexor tendon.  On the area scope was Avage normal sterile saline and then Dermabond was placed.  Dressed a compressive dressing was applied.  Debrided all reactive hyperkeratotic tissue as well.  Follow-up with him in 1 week

## 2021-02-20 DIAGNOSIS — F1120 Opioid dependence, uncomplicated: Secondary | ICD-10-CM | POA: Diagnosis not present

## 2021-02-28 ENCOUNTER — Ambulatory Visit (INDEPENDENT_AMBULATORY_CARE_PROVIDER_SITE_OTHER): Payer: BC Managed Care – PPO | Admitting: Podiatry

## 2021-02-28 ENCOUNTER — Encounter: Payer: Self-pay | Admitting: Podiatry

## 2021-02-28 ENCOUNTER — Other Ambulatory Visit: Payer: Self-pay

## 2021-02-28 DIAGNOSIS — M2041 Other hammer toe(s) (acquired), right foot: Secondary | ICD-10-CM

## 2021-02-28 DIAGNOSIS — M2042 Other hammer toe(s) (acquired), left foot: Secondary | ICD-10-CM

## 2021-02-28 DIAGNOSIS — Z9889 Other specified postprocedural states: Secondary | ICD-10-CM

## 2021-02-28 DIAGNOSIS — L97521 Non-pressure chronic ulcer of other part of left foot limited to breakdown of skin: Secondary | ICD-10-CM

## 2021-02-28 MED ORDER — CEPHALEXIN 500 MG PO CAPS: 500.0000 mg | ORAL_CAPSULE | Freq: Four times a day (QID) | ORAL | 0 refills | Status: AC

## 2021-02-28 NOTE — Progress Notes (Signed)
°  Subjective:  Patient ID: [name], male    DOB: [birth year],   MRN: [id number]  Chief Complaint  Patient presents with   Routine Post Op    Follow up after tenotomy left    57 y.o. male presents for follow-up of left foot tentomys for chronic distal digit ulcerations. Relates he is doing well and not having much trouble.  . Denies any other pedal complaints. Denies n/v/f/c.   No past medical history on file.  Objective:  Physical Exam: Vascular: DP/PT pulses 2/4 bilateral. CFT <3 seconds. Normal hair growth on digits. No edema.  Skin. No lacerations or abrasions bilateral feet. Stab incision noted to plantar PIPJ of second through fourth digits. Some mild purulent drainage noted form second digit incision. Mild maceration noted surrounding fourth digit. Third digit appears well healed. No surrounding erythema or edema. Digital ulcerations looked well healed.  Musculoskeletal: MMT 5/5 bilateral lower extremities in DF, PF, Inversion and Eversion. Deceased ROM in DF of ankle joint.  Neurological: Sensation intact to light touch.   Assessment:   1. Status post surgery   2. Hammertoes of both feet   3. Ulcer of left foot, limited to breakdown of skin [facility])      Plan:  Patient was evaluated and treated and all questions answered.  S/p foot left second third and fourth digit tenotomies  Some mild purulence noted to third digit.  -Prescription keflex sent to pharmacy.  -WB Status: WBAT in surgical shoe  -Foot redressed. Follow-up in one week with Dr. Milinda Pointer.   No follow-ups on file.    Lorenda Peck, DPM

## 2021-03-06 DIAGNOSIS — I10 Essential (primary) hypertension: Secondary | ICD-10-CM | POA: Diagnosis not present

## 2021-03-06 DIAGNOSIS — L0390 Cellulitis, unspecified: Secondary | ICD-10-CM | POA: Diagnosis not present

## 2021-03-06 DIAGNOSIS — I739 Peripheral vascular disease, unspecified: Secondary | ICD-10-CM | POA: Diagnosis not present

## 2021-03-06 DIAGNOSIS — F1120 Opioid dependence, uncomplicated: Secondary | ICD-10-CM | POA: Diagnosis not present

## 2021-03-12 ENCOUNTER — Encounter: Payer: Self-pay | Admitting: Podiatry

## 2021-03-12 ENCOUNTER — Other Ambulatory Visit: Payer: Self-pay

## 2021-03-12 ENCOUNTER — Ambulatory Visit (INDEPENDENT_AMBULATORY_CARE_PROVIDER_SITE_OTHER): Payer: BC Managed Care – PPO | Admitting: Podiatry

## 2021-03-12 DIAGNOSIS — L97521 Non-pressure chronic ulcer of other part of left foot limited to breakdown of skin: Secondary | ICD-10-CM

## 2021-03-12 DIAGNOSIS — Z125 Encounter for screening for malignant neoplasm of prostate: Secondary | ICD-10-CM | POA: Diagnosis not present

## 2021-03-12 DIAGNOSIS — E782 Mixed hyperlipidemia: Secondary | ICD-10-CM | POA: Diagnosis not present

## 2021-03-12 DIAGNOSIS — R030 Elevated blood-pressure reading, without diagnosis of hypertension: Secondary | ICD-10-CM | POA: Diagnosis not present

## 2021-03-12 DIAGNOSIS — E7800 Pure hypercholesterolemia, unspecified: Secondary | ICD-10-CM | POA: Diagnosis not present

## 2021-03-12 DIAGNOSIS — Z131 Encounter for screening for diabetes mellitus: Secondary | ICD-10-CM | POA: Diagnosis not present

## 2021-03-12 NOTE — Progress Notes (Signed)
He presents today for follow-up of his ulceration third toe left foot.  States it is healing okay as far as I can tell.  Objective: Vital signs are stable he is alert and oriented x3.  After the tenotomy's his toes are sitting rectus he does have some reactive hyperkeratotic tissue which I debrided for him today.  But there is no erythema edema cellulitis drainage or odor there is mildly tender on palpation and range of motion.  Assessment: Well-healing surgical toes and distal ulcerations.  Plan: I am going to give him about 2 weeks to get adjusted to wearing regular shoe gear again and then I like to follow-up with him in about a month.  I am going allow him to start back to work 1 March.

## 2021-03-18 DIAGNOSIS — E782 Mixed hyperlipidemia: Secondary | ICD-10-CM | POA: Diagnosis not present

## 2021-03-18 DIAGNOSIS — G4733 Obstructive sleep apnea (adult) (pediatric): Secondary | ICD-10-CM | POA: Diagnosis not present

## 2021-03-18 DIAGNOSIS — I872 Venous insufficiency (chronic) (peripheral): Secondary | ICD-10-CM | POA: Diagnosis not present

## 2021-03-18 DIAGNOSIS — Z23 Encounter for immunization: Secondary | ICD-10-CM | POA: Diagnosis not present

## 2021-03-18 DIAGNOSIS — Z0000 Encounter for general adult medical examination without abnormal findings: Secondary | ICD-10-CM | POA: Diagnosis not present

## 2021-03-18 DIAGNOSIS — R600 Localized edema: Secondary | ICD-10-CM | POA: Diagnosis not present

## 2021-03-20 DIAGNOSIS — F1120 Opioid dependence, uncomplicated: Secondary | ICD-10-CM | POA: Diagnosis not present

## 2021-04-03 DIAGNOSIS — F1120 Opioid dependence, uncomplicated: Secondary | ICD-10-CM | POA: Diagnosis not present

## 2021-04-03 DIAGNOSIS — R600 Localized edema: Secondary | ICD-10-CM | POA: Diagnosis not present

## 2021-04-03 DIAGNOSIS — M549 Dorsalgia, unspecified: Secondary | ICD-10-CM | POA: Diagnosis not present

## 2021-04-03 DIAGNOSIS — I10 Essential (primary) hypertension: Secondary | ICD-10-CM | POA: Diagnosis not present

## 2021-04-09 ENCOUNTER — Ambulatory Visit: Payer: BC Managed Care – PPO | Admitting: Podiatry

## 2021-04-17 DIAGNOSIS — F1120 Opioid dependence, uncomplicated: Secondary | ICD-10-CM | POA: Diagnosis not present

## 2021-05-01 DIAGNOSIS — M549 Dorsalgia, unspecified: Secondary | ICD-10-CM | POA: Diagnosis not present

## 2021-05-01 DIAGNOSIS — R600 Localized edema: Secondary | ICD-10-CM | POA: Diagnosis not present

## 2021-05-01 DIAGNOSIS — I10 Essential (primary) hypertension: Secondary | ICD-10-CM | POA: Diagnosis not present

## 2021-05-01 DIAGNOSIS — F1120 Opioid dependence, uncomplicated: Secondary | ICD-10-CM | POA: Diagnosis not present

## 2021-05-14 DIAGNOSIS — E782 Mixed hyperlipidemia: Secondary | ICD-10-CM | POA: Diagnosis not present

## 2021-05-15 DIAGNOSIS — F1120 Opioid dependence, uncomplicated: Secondary | ICD-10-CM | POA: Diagnosis not present

## 2021-05-20 DIAGNOSIS — F1190 Opioid use, unspecified, uncomplicated: Secondary | ICD-10-CM | POA: Diagnosis not present

## 2021-05-20 DIAGNOSIS — E782 Mixed hyperlipidemia: Secondary | ICD-10-CM | POA: Diagnosis not present

## 2021-05-20 DIAGNOSIS — Z23 Encounter for immunization: Secondary | ICD-10-CM | POA: Diagnosis not present

## 2021-05-29 DIAGNOSIS — I10 Essential (primary) hypertension: Secondary | ICD-10-CM | POA: Diagnosis not present

## 2021-05-29 DIAGNOSIS — F1120 Opioid dependence, uncomplicated: Secondary | ICD-10-CM | POA: Diagnosis not present

## 2021-05-29 DIAGNOSIS — L0390 Cellulitis, unspecified: Secondary | ICD-10-CM | POA: Diagnosis not present

## 2021-05-29 DIAGNOSIS — R600 Localized edema: Secondary | ICD-10-CM | POA: Diagnosis not present

## 2021-06-05 ENCOUNTER — Ambulatory Visit (INDEPENDENT_AMBULATORY_CARE_PROVIDER_SITE_OTHER): Payer: BC Managed Care – PPO | Admitting: Podiatry

## 2021-06-05 ENCOUNTER — Encounter: Payer: Self-pay | Admitting: Podiatry

## 2021-06-05 DIAGNOSIS — L97521 Non-pressure chronic ulcer of other part of left foot limited to breakdown of skin: Secondary | ICD-10-CM

## 2021-06-05 DIAGNOSIS — M2041 Other hammer toe(s) (acquired), right foot: Secondary | ICD-10-CM

## 2021-06-05 DIAGNOSIS — M2042 Other hammer toe(s) (acquired), left foot: Secondary | ICD-10-CM | POA: Diagnosis not present

## 2021-06-06 NOTE — Progress Notes (Signed)
Subjective:  ? ?Patient ID: [name], male   DOB: 56 y.o.   MRN: [id number]  ? ?HPI ?Patient sees Dr. Milinda Pointer for chronic lesion plantar aspect left that his family physician was worried he may be infected and wanted it checked ? ? ?ROS ? ? ?   ?Objective:  ?Physical Exam  ?Neurovascular status intact with keratotic lesion plantar left with patient has had previous digital tenotomy's to try to reduce the pressure on the metatarsal with thick like tissue formation and discoloration ? ?   ?Assessment:  ?Probability for preulcerative lesion but cannot rule out also that its not bony structural in its condition ? ?   ?Plan:  ?H&P reviewed condition and sharp sterile debridement accomplished.  I did not note bleeding underneath this and the skin is intact but it is under stress and ultimately this may require osteotomy.  I know Dr. Posey Pronto does floating osteotomies for this and that might be a consideration for him but I am going to have him see Dr. Milinda Pointer again in 10 weeks for reevaluation or simply just possible redebridement in the future.  Educated him on the bony structure and considerations for other procedures and he is encouraged to come back for regular care ?   ? ? ?

## 2021-06-12 DIAGNOSIS — F1120 Opioid dependence, uncomplicated: Secondary | ICD-10-CM | POA: Diagnosis not present

## 2021-07-02 DIAGNOSIS — F1120 Opioid dependence, uncomplicated: Secondary | ICD-10-CM | POA: Diagnosis not present

## 2021-07-03 DIAGNOSIS — R600 Localized edema: Secondary | ICD-10-CM | POA: Diagnosis not present

## 2021-07-03 DIAGNOSIS — F1120 Opioid dependence, uncomplicated: Secondary | ICD-10-CM | POA: Diagnosis not present

## 2021-07-03 DIAGNOSIS — I10 Essential (primary) hypertension: Secondary | ICD-10-CM | POA: Diagnosis not present

## 2021-07-03 DIAGNOSIS — M549 Dorsalgia, unspecified: Secondary | ICD-10-CM | POA: Diagnosis not present

## 2021-07-17 DIAGNOSIS — F1120 Opioid dependence, uncomplicated: Secondary | ICD-10-CM | POA: Diagnosis not present

## 2021-07-31 DIAGNOSIS — F1120 Opioid dependence, uncomplicated: Secondary | ICD-10-CM | POA: Diagnosis not present

## 2021-07-31 DIAGNOSIS — M549 Dorsalgia, unspecified: Secondary | ICD-10-CM | POA: Diagnosis not present

## 2021-07-31 DIAGNOSIS — R600 Localized edema: Secondary | ICD-10-CM | POA: Diagnosis not present

## 2021-07-31 DIAGNOSIS — I10 Essential (primary) hypertension: Secondary | ICD-10-CM | POA: Diagnosis not present

## 2021-08-14 DIAGNOSIS — F1120 Opioid dependence, uncomplicated: Secondary | ICD-10-CM | POA: Diagnosis not present

## 2021-08-15 ENCOUNTER — Ambulatory Visit: Payer: BC Managed Care – PPO | Admitting: Podiatry

## 2021-08-28 DIAGNOSIS — I10 Essential (primary) hypertension: Secondary | ICD-10-CM | POA: Diagnosis not present

## 2021-08-28 DIAGNOSIS — R600 Localized edema: Secondary | ICD-10-CM | POA: Diagnosis not present

## 2021-08-28 DIAGNOSIS — M549 Dorsalgia, unspecified: Secondary | ICD-10-CM | POA: Diagnosis not present

## 2021-08-28 DIAGNOSIS — F1120 Opioid dependence, uncomplicated: Secondary | ICD-10-CM | POA: Diagnosis not present

## 2021-09-11 DIAGNOSIS — F1120 Opioid dependence, uncomplicated: Secondary | ICD-10-CM | POA: Diagnosis not present

## 2021-10-02 DIAGNOSIS — R600 Localized edema: Secondary | ICD-10-CM | POA: Diagnosis not present

## 2021-10-02 DIAGNOSIS — I10 Essential (primary) hypertension: Secondary | ICD-10-CM | POA: Diagnosis not present

## 2021-10-02 DIAGNOSIS — F1120 Opioid dependence, uncomplicated: Secondary | ICD-10-CM | POA: Diagnosis not present

## 2021-10-02 DIAGNOSIS — M549 Dorsalgia, unspecified: Secondary | ICD-10-CM | POA: Diagnosis not present

## 2021-10-16 DIAGNOSIS — F1120 Opioid dependence, uncomplicated: Secondary | ICD-10-CM | POA: Diagnosis not present

## 2021-10-23 DIAGNOSIS — L0390 Cellulitis, unspecified: Secondary | ICD-10-CM | POA: Diagnosis not present

## 2021-10-23 DIAGNOSIS — R600 Localized edema: Secondary | ICD-10-CM | POA: Diagnosis not present

## 2021-10-23 DIAGNOSIS — I10 Essential (primary) hypertension: Secondary | ICD-10-CM | POA: Diagnosis not present

## 2021-10-23 DIAGNOSIS — F1120 Opioid dependence, uncomplicated: Secondary | ICD-10-CM | POA: Diagnosis not present

## 2021-11-13 DIAGNOSIS — F1120 Opioid dependence, uncomplicated: Secondary | ICD-10-CM | POA: Diagnosis not present

## 2021-11-23 DIAGNOSIS — L03116 Cellulitis of left lower limb: Secondary | ICD-10-CM | POA: Diagnosis not present

## 2021-11-26 ENCOUNTER — Ambulatory Visit (INDEPENDENT_AMBULATORY_CARE_PROVIDER_SITE_OTHER): Payer: BC Managed Care – PPO | Admitting: Podiatry

## 2021-11-26 ENCOUNTER — Emergency Department (HOSPITAL_COMMUNITY): Payer: BC Managed Care – PPO

## 2021-11-26 ENCOUNTER — Other Ambulatory Visit: Payer: Self-pay

## 2021-11-26 ENCOUNTER — Inpatient Hospital Stay (HOSPITAL_COMMUNITY)
Admission: EM | Admit: 2021-11-26 | Discharge: 2021-11-29 | DRG: 854 | Disposition: A | Discharge status: Home / Self Care | Payer: BC Managed Care – PPO | Attending: Internal Medicine | Admitting: Internal Medicine

## 2021-11-26 ENCOUNTER — Encounter (HOSPITAL_COMMUNITY): Payer: Self-pay

## 2021-11-26 DIAGNOSIS — E785 Hyperlipidemia, unspecified: Secondary | ICD-10-CM | POA: Diagnosis not present

## 2021-11-26 DIAGNOSIS — M86172 Other acute osteomyelitis, left ankle and foot: Secondary | ICD-10-CM | POA: Diagnosis present

## 2021-11-26 DIAGNOSIS — L03116 Cellulitis of left lower limb: Secondary | ICD-10-CM | POA: Diagnosis present

## 2021-11-26 DIAGNOSIS — J302 Other seasonal allergic rhinitis: Secondary | ICD-10-CM | POA: Diagnosis not present

## 2021-11-26 DIAGNOSIS — L02612 Cutaneous abscess of left foot: Secondary | ICD-10-CM | POA: Diagnosis not present

## 2021-11-26 DIAGNOSIS — I890 Lymphedema, not elsewhere classified: Secondary | ICD-10-CM | POA: Diagnosis present

## 2021-11-26 DIAGNOSIS — I739 Peripheral vascular disease, unspecified: Secondary | ICD-10-CM | POA: Diagnosis not present

## 2021-11-26 DIAGNOSIS — A419 Sepsis, unspecified organism: Secondary | ICD-10-CM | POA: Diagnosis not present

## 2021-11-26 DIAGNOSIS — L84 Corns and callosities: Secondary | ICD-10-CM | POA: Diagnosis present

## 2021-11-26 DIAGNOSIS — G4733 Obstructive sleep apnea (adult) (pediatric): Secondary | ICD-10-CM | POA: Diagnosis not present

## 2021-11-26 DIAGNOSIS — I872 Venous insufficiency (chronic) (peripheral): Secondary | ICD-10-CM | POA: Diagnosis not present

## 2021-11-26 DIAGNOSIS — M1990 Unspecified osteoarthritis, unspecified site: Secondary | ICD-10-CM | POA: Diagnosis not present

## 2021-11-26 DIAGNOSIS — R600 Localized edema: Secondary | ICD-10-CM | POA: Diagnosis not present

## 2021-11-26 DIAGNOSIS — M7989 Other specified soft tissue disorders: Secondary | ICD-10-CM | POA: Diagnosis not present

## 2021-11-26 DIAGNOSIS — Z79899 Other long term (current) drug therapy: Secondary | ICD-10-CM | POA: Diagnosis not present

## 2021-11-26 DIAGNOSIS — K5900 Constipation, unspecified: Secondary | ICD-10-CM | POA: Diagnosis not present

## 2021-11-26 DIAGNOSIS — I10 Essential (primary) hypertension: Secondary | ICD-10-CM | POA: Diagnosis not present

## 2021-11-26 DIAGNOSIS — F17200 Nicotine dependence, unspecified, uncomplicated: Secondary | ICD-10-CM | POA: Diagnosis not present

## 2021-11-26 DIAGNOSIS — F1110 Opioid abuse, uncomplicated: Secondary | ICD-10-CM | POA: Diagnosis present

## 2021-11-26 DIAGNOSIS — G629 Polyneuropathy, unspecified: Secondary | ICD-10-CM | POA: Diagnosis not present

## 2021-11-26 DIAGNOSIS — L089 Local infection of the skin and subcutaneous tissue, unspecified: Secondary | ICD-10-CM | POA: Diagnosis not present

## 2021-11-26 DIAGNOSIS — D649 Anemia, unspecified: Secondary | ICD-10-CM | POA: Diagnosis not present

## 2021-11-26 DIAGNOSIS — F17210 Nicotine dependence, cigarettes, uncomplicated: Secondary | ICD-10-CM | POA: Diagnosis not present

## 2021-11-26 DIAGNOSIS — L97529 Non-pressure chronic ulcer of other part of left foot with unspecified severity: Secondary | ICD-10-CM | POA: Diagnosis not present

## 2021-11-26 DIAGNOSIS — L0390 Cellulitis, unspecified: Secondary | ICD-10-CM | POA: Diagnosis not present

## 2021-11-26 LAB — CBC WITH DIFFERENTIAL/PLATELET
Abs Immature Granulocytes: 0.1 10*3/uL — ABNORMAL HIGH (ref 0.00–0.07)
Basophils Absolute: 0.1 10*3/uL (ref 0.0–0.1)
Basophils Relative: 0 %
Eosinophils Absolute: 0.1 10*3/uL (ref 0.0–0.5)
Eosinophils Relative: 1 %
HCT: 38.5 % — ABNORMAL LOW (ref 39.0–52.0)
Hemoglobin: 13.1 g/dL (ref 13.0–17.0)
Immature Granulocytes: 1 %
Lymphocytes Relative: 14 %
Lymphs Abs: 1.8 10*3/uL (ref 0.7–4.0)
MCH: 31.4 pg (ref 26.0–34.0)
MCHC: 34 g/dL (ref 30.0–36.0)
MCV: 92.3 fL (ref 80.0–100.0)
Monocytes Absolute: 1.7 10*3/uL — ABNORMAL HIGH (ref 0.1–1.0)
Monocytes Relative: 13 %
Neutro Abs: 9.3 10*3/uL — ABNORMAL HIGH (ref 1.7–7.7)
Neutrophils Relative %: 71 %
Platelets: 287 10*3/uL (ref 150–400)
RBC: 4.17 MIL/uL — ABNORMAL LOW (ref 4.22–5.81)
RDW: 11.9 % (ref 11.5–15.5)
WBC: 13 10*3/uL — ABNORMAL HIGH (ref 4.0–10.5)
nRBC: 0 % (ref 0.0–0.2)

## 2021-11-26 LAB — BASIC METABOLIC PANEL
Anion gap: 8 (ref 5–15)
BUN: 16 mg/dL (ref 6–20)
CO2: 27 mmol/L (ref 22–32)
Calcium: 8.6 mg/dL — ABNORMAL LOW (ref 8.9–10.3)
Chloride: 102 mmol/L (ref 98–111)
Creatinine, Ser: 0.75 mg/dL (ref 0.61–1.24)
GFR, Estimated: >60 mL/min (ref >60)
Glucose, Bld: 121 mg/dL — ABNORMAL HIGH (ref 70–99)
Potassium: 4.1 mmol/L (ref 3.5–5.1)
Sodium: 137 mmol/L (ref 135–145)

## 2021-11-26 LAB — LACTIC ACID, PLASMA: Lactic Acid, Venous: 1 mmol/L (ref 0.5–1.9)

## 2021-11-26 NOTE — ED Provider Triage Note (Signed)
Emergency Medicine Provider Triage Evaluation Note  [name] , a 56 y.o. male  was evaluated in triage.  Pt complains of left foot pain with increasing edema and redness. Seen by podiatry today. Wound opened with purulent drainage. Has been on antibiotics since Friday, with worsening of symptoms today. Not diabetic.  Review of Systems  Positive: Foot wound, swelling, redness. Fever and chills Negative: Cough, chest pain, shortness of breath, abdominal pain, nausea, vomitng, diarrhea  Physical Exam  BP (!) 132/99 (BP Location: Left Arm)   Pulse 95   Temp 98.2 F (36.8 C) (Oral)   Resp 17   SpO2 99%  Gen:   Awake, no distress   Resp:  Normal effort  MSK:   Moves extremities without difficulty. Foot wound with purulent drainage. Edema and erythema to mid lower leg Other:    Medical Decision Making  Medically screening exam initiated at 7:31 PM.  Appropriate orders placed.  [name] was informed that the remainder of the evaluation will be completed by another provider, this initial triage assessment does not replace that evaluation, and the importance of remaining in the ED until their evaluation is complete.     Etta Quill, NP 11/26/21 2103

## 2021-11-26 NOTE — Progress Notes (Signed)
Subjective:  Patient ID: [name], male    DOB: 11-06-1965,  MRN: [id number]  Chief Complaint  Patient presents with   Callouses    56 y.o. male presents with the above complaint.  Patient presents with left foot abscess/possible underlying osteomyelitis with cellulitis up to the mid leg.  Patient states that this has been present for quite some time is progressive gotten worse hurts with ambulation worse with pressure.  He states that he went to urgent care few days ago they took x-rays and they sent him out on antibiotics.  He states it is improving little bit but still very red.  He has not seen anyone else prior to seeing me he denies any nausea fever chills vomiting.  He is not a diabetic   Review of Systems: Negative except as noted in the HPI. Denies N/V/F/Ch.  No past medical history on file.  Current Outpatient Medications:    baclofen (LIORESAL) 10 MG tablet, Take 10 mg by mouth 2 (two) times daily., Disp: , Rfl:    buprenorphine-naloxone (SUBOXONE) 8-2 mg SUBL SL tablet, Place 1 tablet under the tongue 2 (two) times daily with a meal., Disp: , Rfl:    cloNIDine (CATAPRES) 0.1 MG tablet, Take 0.1 mg by mouth at bedtime., Disp: , Rfl:    furosemide (LASIX) 20 MG tablet, Take 20 mg by mouth daily., Disp: , Rfl:    gabapentin (NEURONTIN) 100 MG capsule, Take 100 mg by mouth at bedtime., Disp: , Rfl:    KLOR-CON M20 20 MEQ tablet, Take 20 mEq by mouth daily., Disp: , Rfl:    methocarbamol (ROBAXIN) 750 MG tablet, Take 750 mg by mouth every 4 (four) hours as needed., Disp: , Rfl:    mupirocin ointment (BACTROBAN) 2 %, Apply to wound after soaking BID, Disp: 30 g, Rfl: 1   naproxen (NAPROSYN) 500 MG tablet, Take 500 mg by mouth 2 (two) times daily as needed., Disp: , Rfl:    nicotine polacrilex (NICORETTE) 4 MG gum, SMARTSIG:1 Gum By Mouth Twice Daily PRN, Disp: , Rfl:    omeprazole (PRILOSEC) 20 MG capsule, Take 20 mg by mouth every morning., Disp: , Rfl:    potassium  chloride (KLOR-CON) 10 MEQ tablet, Take 10 mEq by mouth daily., Disp: , Rfl:   Social History   Tobacco Use  Smoking Status Every Day  Smokeless Tobacco Never    No Known Allergies Objective:  There were no vitals filed for this visit. There is no height or weight on file to calculate BMI. Constitutional Well developed. Well nourished.  Vascular Dorsalis pedis pulses non palpable bilaterally. Posterior tibial pulses non palpable bilaterally. Capillary refill normal to all digits.  No cyanosis or clubbing noted. Pedal hair growth normal.  Neurologic Normal speech. Oriented to person, place, and time. Epicritic sensation to light touch grossly present bilaterally.  Dermatologic Left foot abscess clinically appreciated with some superficial purulent drainage noted.  Abscess noted to the dorsum of the forefoot.  Cellulitis noted up to the mid leg.  No malodor present.  Submetatarsal 3 wound noted does not probe down to deep tissue.  Orthopedic: Musculoskeletal strength 5 out of 5   Radiographs: Prior x-rays were reviewed which did not show any signs of osteomyelitis.    Assessment:   1. Abscess of left foot   2. Cellulitis of foot, left    Plan:  Patient was evaluated and treated and all questions answered.  Left foot abscess with cellulitis up to the mid  leg -All questions and concerns were discussed with the patient in extensive detail. -Patient will benefit from admission i into the hospital for IV antibiotics and an MRI to rule out an abscess. -He is a high risk of undergoing amputation given the nature of the infection. -Patient will need surgical intervention pending the MRI -He will also benefit from ABIs PVRs to assess vascular flow to the left lower extremity.  His previous one was a year over a year ago -Betadine wet-to-dry dressing -Partial weightbearing to the left heel -Case related to the on-call doctor  No follow-ups on file.

## 2021-11-26 NOTE — ED Triage Notes (Signed)
Patients left foot is swollen and red. Went to urgent care and got some oozing out of a callous. They gave him antibiotics. Now redness is traveling more up his shin.

## 2021-11-27 ENCOUNTER — Other Ambulatory Visit: Payer: Self-pay

## 2021-11-27 ENCOUNTER — Inpatient Hospital Stay (HOSPITAL_COMMUNITY): Payer: BC Managed Care – PPO

## 2021-11-27 ENCOUNTER — Encounter (HOSPITAL_COMMUNITY)
Admission: EM | Disposition: A | Discharge status: Home / Self Care | Payer: Self-pay | Source: Home / Self Care | Attending: Internal Medicine

## 2021-11-27 ENCOUNTER — Encounter (HOSPITAL_COMMUNITY): Payer: Self-pay | Admitting: Family Medicine

## 2021-11-27 ENCOUNTER — Inpatient Hospital Stay (HOSPITAL_COMMUNITY): Payer: BC Managed Care – PPO | Admitting: Anesthesiology

## 2021-11-27 DIAGNOSIS — L0390 Cellulitis, unspecified: Secondary | ICD-10-CM

## 2021-11-27 DIAGNOSIS — I10 Essential (primary) hypertension: Secondary | ICD-10-CM

## 2021-11-27 DIAGNOSIS — A419 Sepsis, unspecified organism: Secondary | ICD-10-CM | POA: Diagnosis present

## 2021-11-27 DIAGNOSIS — F1110 Opioid abuse, uncomplicated: Secondary | ICD-10-CM | POA: Diagnosis present

## 2021-11-27 DIAGNOSIS — K5900 Constipation, unspecified: Secondary | ICD-10-CM | POA: Diagnosis not present

## 2021-11-27 DIAGNOSIS — F17210 Nicotine dependence, cigarettes, uncomplicated: Secondary | ICD-10-CM | POA: Diagnosis present

## 2021-11-27 DIAGNOSIS — I739 Peripheral vascular disease, unspecified: Secondary | ICD-10-CM

## 2021-11-27 DIAGNOSIS — G4733 Obstructive sleep apnea (adult) (pediatric): Secondary | ICD-10-CM | POA: Diagnosis present

## 2021-11-27 DIAGNOSIS — I890 Lymphedema, not elsewhere classified: Secondary | ICD-10-CM

## 2021-11-27 DIAGNOSIS — L03116 Cellulitis of left lower limb: Secondary | ICD-10-CM

## 2021-11-27 DIAGNOSIS — G629 Polyneuropathy, unspecified: Secondary | ICD-10-CM

## 2021-11-27 DIAGNOSIS — L02612 Cutaneous abscess of left foot: Secondary | ICD-10-CM | POA: Diagnosis not present

## 2021-11-27 DIAGNOSIS — Z79899 Other long term (current) drug therapy: Secondary | ICD-10-CM | POA: Diagnosis not present

## 2021-11-27 DIAGNOSIS — E785 Hyperlipidemia, unspecified: Secondary | ICD-10-CM | POA: Diagnosis present

## 2021-11-27 DIAGNOSIS — J302 Other seasonal allergic rhinitis: Secondary | ICD-10-CM | POA: Diagnosis present

## 2021-11-27 DIAGNOSIS — I872 Venous insufficiency (chronic) (peripheral): Secondary | ICD-10-CM | POA: Diagnosis present

## 2021-11-27 DIAGNOSIS — L84 Corns and callosities: Secondary | ICD-10-CM | POA: Diagnosis present

## 2021-11-27 DIAGNOSIS — M1990 Unspecified osteoarthritis, unspecified site: Secondary | ICD-10-CM | POA: Diagnosis present

## 2021-11-27 DIAGNOSIS — M86172 Other acute osteomyelitis, left ankle and foot: Secondary | ICD-10-CM | POA: Diagnosis not present

## 2021-11-27 HISTORY — DX: Sepsis, unspecified organism: A41.9

## 2021-11-27 HISTORY — PX: IRRIGATION AND DEBRIDEMENT ABSCESS: SHX5252

## 2021-11-27 HISTORY — DX: Polyneuropathy, unspecified: G62.9

## 2021-11-27 LAB — CBC
HCT: 33.7 % — ABNORMAL LOW (ref 39.0–52.0)
Hemoglobin: 11.5 g/dL — ABNORMAL LOW (ref 13.0–17.0)
MCH: 31.1 pg (ref 26.0–34.0)
MCHC: 34.1 g/dL (ref 30.0–36.0)
MCV: 91.1 fL (ref 80.0–100.0)
Platelets: 258 10*3/uL (ref 150–400)
RBC: 3.7 MIL/uL — ABNORMAL LOW (ref 4.22–5.81)
RDW: 12 % (ref 11.5–15.5)
WBC: 11.4 10*3/uL — ABNORMAL HIGH (ref 4.0–10.5)
nRBC: 0 % (ref 0.0–0.2)

## 2021-11-27 LAB — BASIC METABOLIC PANEL
Anion gap: 6 (ref 5–15)
BUN: 15 mg/dL (ref 6–20)
CO2: 24 mmol/L (ref 22–32)
Calcium: 8.2 mg/dL — ABNORMAL LOW (ref 8.9–10.3)
Chloride: 106 mmol/L (ref 98–111)
Creatinine, Ser: 0.67 mg/dL (ref 0.61–1.24)
GFR, Estimated: >60 mL/min (ref >60)
Glucose, Bld: 100 mg/dL — ABNORMAL HIGH (ref 70–99)
Potassium: 3.7 mmol/L (ref 3.5–5.1)
Sodium: 136 mmol/L (ref 135–145)

## 2021-11-27 LAB — PROCALCITONIN: Procalcitonin: <0.1 ng/mL

## 2021-11-27 LAB — CORTISOL-AM, BLOOD: Cortisol - AM: 4.1 ug/dL — ABNORMAL LOW (ref 6.7–22.6)

## 2021-11-27 LAB — PROTIME-INR
INR: 1.2 (ref 0.8–1.2)
Prothrombin Time: 14.9 seconds (ref 11.4–15.2)

## 2021-11-27 LAB — HIV ANTIBODY (ROUTINE TESTING W REFLEX): HIV Screen 4th Generation wRfx: NONREACTIVE

## 2021-11-27 LAB — LACTIC ACID, PLASMA: Lactic Acid, Venous: 0.7 mmol/L (ref 0.5–1.9)

## 2021-11-27 SURGERY — IRRIGATION AND DEBRIDEMENT ABSCESS
Anesthesia: General | Laterality: Left

## 2021-11-27 MED ORDER — PROMETHAZINE HCL 25 MG/ML IJ SOLN: 6.2500 mg | INTRAMUSCULAR | Status: DC | PRN

## 2021-11-27 MED ORDER — SODIUM CHLORIDE 0.9 % IV SOLN: 2.0000 g | Freq: Once | INTRAVENOUS | Status: DC

## 2021-11-27 MED ORDER — GABAPENTIN 100 MG PO CAPS
100.0000 mg | ORAL_CAPSULE | Freq: Every day | ORAL | Status: DC
  Administered 2021-11-27 – 2021-11-28 (×3): 100 mg via ORAL
  Filled 2021-11-27 (×3): qty 1

## 2021-11-27 MED ORDER — BUPRENORPHINE HCL-NALOXONE HCL 2-0.5 MG SL SUBL: 2.0000 | SUBLINGUAL_TABLET | Freq: Four times a day (QID) | SUBLINGUAL | Status: DC

## 2021-11-27 MED ORDER — ACETAMINOPHEN 650 MG RE SUPP: 650.0000 mg | Freq: Four times a day (QID) | RECTAL | Status: DC | PRN

## 2021-11-27 MED ORDER — POTASSIUM CHLORIDE CRYS ER 20 MEQ PO TBCR
20.0000 meq | EXTENDED_RELEASE_TABLET | Freq: Every day | ORAL | Status: DC
  Administered 2021-11-27 – 2021-11-29 (×3): 20 meq via ORAL
  Filled 2021-11-27 (×3): qty 1

## 2021-11-27 MED ORDER — VANCOMYCIN HCL 2000 MG/400ML IV SOLN
2000.0000 mg | Freq: Once | INTRAVENOUS | Status: AC
  Administered 2021-11-27: 2000 mg via INTRAVENOUS
  Filled 2021-11-27: qty 400

## 2021-11-27 MED ORDER — CLONIDINE HCL 0.1 MG PO TABS: 0.1000 mg | ORAL_TABLET | Freq: Every day | ORAL | Status: DC | PRN

## 2021-11-27 MED ORDER — BUPIVACAINE HCL (PF) 0.5 % IJ SOLN
INTRAMUSCULAR | Status: AC
  Filled 2021-11-27: qty 30

## 2021-11-27 MED ORDER — OXYCODONE HCL 5 MG PO TABS: 5.0000 mg | ORAL_TABLET | Freq: Once | ORAL | Status: DC | PRN

## 2021-11-27 MED ORDER — SODIUM CHLORIDE 0.9 % IV SOLN: INTRAVENOUS | Status: DC

## 2021-11-27 MED ORDER — BUPRENORPHINE HCL-NALOXONE HCL 8-2 MG SL SUBL
0.5000 | SUBLINGUAL_TABLET | Freq: Four times a day (QID) | SUBLINGUAL | Status: DC
  Administered 2021-11-27 – 2021-11-29 (×7): 0.5 via SUBLINGUAL
  Filled 2021-11-27 (×7): qty 1

## 2021-11-27 MED ORDER — PROPOFOL 10 MG/ML IV BOLUS
INTRAVENOUS | Status: DC | PRN
  Administered 2021-11-27: 200 mg via INTRAVENOUS

## 2021-11-27 MED ORDER — FENTANYL CITRATE PF 50 MCG/ML IJ SOSY: 25.0000 ug | PREFILLED_SYRINGE | INTRAMUSCULAR | Status: DC | PRN

## 2021-11-27 MED ORDER — ONDANSETRON HCL 4 MG/2ML IJ SOLN
INTRAMUSCULAR | Status: DC | PRN
  Administered 2021-11-27: 4 mg via INTRAVENOUS

## 2021-11-27 MED ORDER — TRAZODONE HCL 50 MG PO TABS: 25.0000 mg | ORAL_TABLET | Freq: Every evening | ORAL | Status: DC | PRN

## 2021-11-27 MED ORDER — SODIUM CHLORIDE 0.9 % IR SOLN
Status: DC | PRN
  Administered 2021-11-27: 3000 mL

## 2021-11-27 MED ORDER — MIDAZOLAM HCL 5 MG/5ML IJ SOLN
INTRAMUSCULAR | Status: DC | PRN
  Administered 2021-11-27: 2 mg via INTRAVENOUS

## 2021-11-27 MED ORDER — FENTANYL CITRATE (PF) 100 MCG/2ML IJ SOLN
INTRAMUSCULAR | Status: AC
  Filled 2021-11-27: qty 2

## 2021-11-27 MED ORDER — DIPHENHYDRAMINE HCL 50 MG/ML IJ SOLN
INTRAMUSCULAR | Status: DC | PRN
  Administered 2021-11-27: 12.5 mg via INTRAVENOUS

## 2021-11-27 MED ORDER — CELECOXIB 200 MG PO CAPS
ORAL_CAPSULE | ORAL | Status: AC
  Filled 2021-11-27: qty 1

## 2021-11-27 MED ORDER — GADOBUTROL 1 MMOL/ML IV SOLN
10.0000 mL | Freq: Once | INTRAVENOUS | Status: AC | PRN
  Administered 2021-11-27: 10 mL via INTRAVENOUS

## 2021-11-27 MED ORDER — ONDANSETRON HCL 4 MG PO TABS: 4.0000 mg | ORAL_TABLET | Freq: Four times a day (QID) | ORAL | Status: DC | PRN

## 2021-11-27 MED ORDER — PHENYLEPHRINE 80 MCG/ML (10ML) SYRINGE FOR IV PUSH (FOR BLOOD PRESSURE SUPPORT)
PREFILLED_SYRINGE | INTRAVENOUS | Status: DC | PRN
  Administered 2021-11-27 (×2): 80 ug via INTRAVENOUS

## 2021-11-27 MED ORDER — ENOXAPARIN SODIUM 40 MG/0.4ML IJ SOSY
40.0000 mg | PREFILLED_SYRINGE | INTRAMUSCULAR | Status: DC
  Administered 2021-11-27 – 2021-11-29 (×3): 40 mg via SUBCUTANEOUS
  Filled 2021-11-27 (×3): qty 0.4

## 2021-11-27 MED ORDER — LIDOCAINE HCL (PF) 2 % IJ SOLN
INTRAMUSCULAR | Status: AC
  Filled 2021-11-27: qty 5

## 2021-11-27 MED ORDER — SODIUM CHLORIDE 0.9 % IV SOLN
2.0000 g | INTRAVENOUS | Status: DC
  Administered 2021-11-27: 2 g via INTRAVENOUS
  Filled 2021-11-27: qty 20

## 2021-11-27 MED ORDER — PROPOFOL 10 MG/ML IV BOLUS
INTRAVENOUS | Status: AC
  Filled 2021-11-27: qty 20

## 2021-11-27 MED ORDER — VANCOMYCIN HCL 1000 MG IV SOLR
INTRAVENOUS | Status: AC
  Filled 2021-11-27: qty 20

## 2021-11-27 MED ORDER — SODIUM CHLORIDE 0.9 % IV SOLN
2.0000 g | Freq: Three times a day (TID) | INTRAVENOUS | Status: DC
  Administered 2021-11-27 – 2021-11-29 (×7): 2 g via INTRAVENOUS
  Filled 2021-11-27 (×7): qty 12.5

## 2021-11-27 MED ORDER — SODIUM CHLORIDE 0.9 % IV SOLN: 2.0000 g | INTRAVENOUS | Status: DC

## 2021-11-27 MED ORDER — FUROSEMIDE 40 MG PO TABS
40.0000 mg | ORAL_TABLET | Freq: Every morning | ORAL | Status: DC
  Administered 2021-11-27 – 2021-11-29 (×3): 40 mg via ORAL
  Filled 2021-11-27 (×3): qty 1

## 2021-11-27 MED ORDER — BUPIVACAINE HCL (PF) 0.5 % IJ SOLN
INTRAMUSCULAR | Status: DC | PRN
  Administered 2021-11-27: 10 mL

## 2021-11-27 MED ORDER — CELECOXIB 200 MG PO CAPS
200.0000 mg | ORAL_CAPSULE | Freq: Once | ORAL | Status: AC
  Administered 2021-11-27: 200 mg via ORAL

## 2021-11-27 MED ORDER — CHLORHEXIDINE GLUCONATE 0.12 % MT SOLN
15.0000 mL | Freq: Once | OROMUCOSAL | Status: AC
  Administered 2021-11-27: 15 mL via OROMUCOSAL

## 2021-11-27 MED ORDER — VANCOMYCIN HCL 1 G IV SOLR
INTRAVENOUS | Status: DC | PRN
  Administered 2021-11-27: 1 g via TOPICAL

## 2021-11-27 MED ORDER — VANCOMYCIN HCL 2000 MG/400ML IV SOLN
2000.0000 mg | Freq: Two times a day (BID) | INTRAVENOUS | Status: DC
  Administered 2021-11-27 – 2021-11-29 (×4): 2000 mg via INTRAVENOUS
  Filled 2021-11-27 (×4): qty 400

## 2021-11-27 MED ORDER — LIDOCAINE 2% (20 MG/ML) 5 ML SYRINGE
INTRAMUSCULAR | Status: DC | PRN
  Administered 2021-11-27: 60 mg via INTRAVENOUS

## 2021-11-27 MED ORDER — VANCOMYCIN HCL IN DEXTROSE 1-5 GM/200ML-% IV SOLN: 1000.0000 mg | Freq: Once | INTRAVENOUS | Status: DC

## 2021-11-27 MED ORDER — ONDANSETRON HCL 4 MG/2ML IJ SOLN
INTRAMUSCULAR | Status: AC
  Filled 2021-11-27: qty 2

## 2021-11-27 MED ORDER — METRONIDAZOLE 500 MG/100ML IV SOLN
500.0000 mg | Freq: Two times a day (BID) | INTRAVENOUS | Status: DC
  Administered 2021-11-27 – 2021-11-29 (×5): 500 mg via INTRAVENOUS
  Filled 2021-11-27 (×5): qty 100

## 2021-11-27 MED ORDER — ACETAMINOPHEN 325 MG PO TABS
650.0000 mg | ORAL_TABLET | Freq: Four times a day (QID) | ORAL | Status: DC | PRN
  Administered 2021-11-27 – 2021-11-28 (×3): 650 mg via ORAL
  Filled 2021-11-27 (×3): qty 2

## 2021-11-27 MED ORDER — LACTATED RINGERS IV SOLN: INTRAVENOUS | Status: DC

## 2021-11-27 MED ORDER — ONDANSETRON HCL 4 MG/2ML IJ SOLN: 4.0000 mg | Freq: Four times a day (QID) | INTRAMUSCULAR | Status: DC | PRN

## 2021-11-27 MED ORDER — MIDAZOLAM HCL 2 MG/2ML IJ SOLN
INTRAMUSCULAR | Status: AC
  Filled 2021-11-27: qty 2

## 2021-11-27 MED ORDER — MAGNESIUM HYDROXIDE 400 MG/5ML PO SUSP: 30.0000 mL | Freq: Every day | ORAL | Status: DC | PRN

## 2021-11-27 MED ORDER — OXYCODONE HCL 5 MG/5ML PO SOLN: 5.0000 mg | Freq: Once | ORAL | Status: DC | PRN

## 2021-11-27 MED ORDER — METRONIDAZOLE 500 MG/100ML IV SOLN
500.0000 mg | Freq: Two times a day (BID) | INTRAVENOUS | Status: DC
  Administered 2021-11-27: 500 mg via INTRAVENOUS
  Filled 2021-11-27: qty 100

## 2021-11-27 MED ORDER — METRONIDAZOLE 500 MG/100ML IV SOLN: 500.0000 mg | Freq: Two times a day (BID) | INTRAVENOUS | Status: DC

## 2021-11-27 MED ORDER — FENTANYL CITRATE (PF) 100 MCG/2ML IJ SOLN
INTRAMUSCULAR | Status: DC | PRN
  Administered 2021-11-27 (×2): 50 ug via INTRAVENOUS

## 2021-11-27 SURGICAL SUPPLY — 60 items
APL PRP STRL LF DISP 70% ISPRP (MISCELLANEOUS)
BAG COUNTER SPONGE SURGICOUNT (BAG) ×2 IMPLANT
BAG SPNG CNTER NS LX DISP (BAG)
BLADE SURG 15 STRL LF DISP TIS (BLADE) ×2 IMPLANT
BLADE SURG 15 STRL SS (BLADE) ×1
BNDG CMPR 9X4 STRL LF SNTH (GAUZE/BANDAGES/DRESSINGS)
BNDG ELASTIC 3X5.8 VLCR STR LF (GAUZE/BANDAGES/DRESSINGS) ×2 IMPLANT
BNDG ELASTIC 4X5.8 VLCR STR LF (GAUZE/BANDAGES/DRESSINGS) ×2 IMPLANT
BNDG ESMARK 4X9 LF (GAUZE/BANDAGES/DRESSINGS) IMPLANT
BNDG GAUZE DERMACEA FLUFF 4 (GAUZE/BANDAGES/DRESSINGS) ×2 IMPLANT
BNDG GZE DERMACEA 4 6PLY (GAUZE/BANDAGES/DRESSINGS)
CHLORAPREP W/TINT 26 (MISCELLANEOUS) IMPLANT
CNTNR URN SCR LID CUP LEK RST (MISCELLANEOUS) IMPLANT
CONT SPEC 4OZ STRL OR WHT (MISCELLANEOUS)
COVER BACK TABLE 60X90IN (DRAPES) ×2 IMPLANT
CUFF TOURN SGL QUICK 18X4 (TOURNIQUET CUFF) IMPLANT
CUFF TOURN SGL QUICK 24 (TOURNIQUET CUFF)
CUFF TRNQT CYL 24X4X16.5-23 (TOURNIQUET CUFF) IMPLANT
DRAPE 3/4 80X56 (DRAPES) ×2 IMPLANT
DRAPE EXTREMITY T 121X128X90 (DISPOSABLE) ×2 IMPLANT
DRAPE SHEET LG 3/4 BI-LAMINATE (DRAPES) ×2 IMPLANT
DRAPE U-SHAPE 47X51 STRL (DRAPES) ×2 IMPLANT
ELECT REM PT RETURN 15FT ADLT (MISCELLANEOUS) ×2 IMPLANT
GAUZE SPONGE 4X4 12PLY STRL (GAUZE/BANDAGES/DRESSINGS) ×2 IMPLANT
GAUZE XEROFORM 1X8 LF (GAUZE/BANDAGES/DRESSINGS) IMPLANT
GLOVE BIO SURGEON STRL SZ7.5 (GLOVE) ×2 IMPLANT
GLOVE BIOGEL PI IND STRL 8 (GLOVE) ×2 IMPLANT
GOWN STRL REUS W/ TWL XL LVL3 (GOWN DISPOSABLE) ×2 IMPLANT
GOWN STRL REUS W/TWL XL LVL3 (GOWN DISPOSABLE) ×1
HANDPIECE INTERPULSE COAX TIP (DISPOSABLE) ×1
KIT BASIN OR (CUSTOM PROCEDURE TRAY) ×2 IMPLANT
KIT STIMULAN RAPID CURE  10CC (Orthopedic Implant) ×1 IMPLANT
KIT STIMULAN RAPID CURE 10CC (Orthopedic Implant) IMPLANT
KIT TURNOVER KIT A (KITS) IMPLANT
MANIFOLD NEPTUNE II (INSTRUMENTS) ×2 IMPLANT
NDL HYPO 25X1 1.5 SAFETY (NEEDLE) ×2 IMPLANT
NEEDLE HYPO 25X1 1.5 SAFETY (NEEDLE) ×1 IMPLANT
NS IRRIG 1000ML POUR BTL (IV SOLUTION) IMPLANT
PADDING CAST ABS COTTON 4X4 ST (CAST SUPPLIES) ×2 IMPLANT
SET HNDPC FAN SPRY TIP SCT (DISPOSABLE) IMPLANT
SET IRRIG Y TYPE TUR BLADDER L (SET/KITS/TRAYS/PACK) IMPLANT
SPONGE T-LAP 4X18 ~~LOC~~+RFID (SPONGE) ×2 IMPLANT
STAPLER VISISTAT 35W (STAPLE) IMPLANT
STOCKINETTE 6  STRL (DRAPES)
STOCKINETTE 6 STRL (DRAPES) ×2 IMPLANT
SUCTION FRAZIER HANDLE 10FR (MISCELLANEOUS)
SUCTION TUBE FRAZIER 10FR DISP (MISCELLANEOUS) IMPLANT
SUT ETHILON 3 0 PS 1 (SUTURE) IMPLANT
SUT ETHILON 4 0 PS 2 18 (SUTURE) IMPLANT
SUT MNCRL AB 3-0 PS2 18 (SUTURE) IMPLANT
SUT MNCRL AB 4-0 PS2 18 (SUTURE) IMPLANT
SUT PROLENE 3 0 PS 2 (SUTURE) IMPLANT
SUT VIC AB 2-0 SH 27 (SUTURE)
SUT VIC AB 2-0 SH 27XBRD (SUTURE) IMPLANT
SWAB CULTURE ESWAB REG 1ML (MISCELLANEOUS) IMPLANT
SYR BULB EAR ULCER 3OZ GRN STR (SYRINGE) ×2 IMPLANT
SYR CONTROL 10ML LL (SYRINGE) ×2 IMPLANT
TUBE IRRIGATION SET MISONIX (TUBING) IMPLANT
UNDERPAD 30X36 HEAVY ABSORB (UNDERPADS AND DIAPERS) ×2 IMPLANT
YANKAUER SUCT BULB TIP NO VENT (SUCTIONS) ×2 IMPLANT

## 2021-11-27 NOTE — Assessment & Plan Note (Signed)
-   He will continue his diuretic therapy.

## 2021-11-27 NOTE — Anesthesia Postprocedure Evaluation (Signed)
Anesthesia Post Note  [name]  Procedure(s) Performed: IRRIGATION AND DEBRIDEMENT foot  3rd ray amputatiion (Left)     Patient location during evaluation: PACU Anesthesia Type: General Level of consciousness: awake and alert Pain management: pain level controlled Vital Signs Assessment: post-procedure vital signs reviewed and stable Respiratory status: spontaneous breathing, nonlabored ventilation and respiratory function stable Cardiovascular status: stable and blood pressure returned to baseline Anesthetic complications: no   No notable events documented.  Last Vitals:  Vitals:   11/27/21 1931 11/27/21 1944  BP: 127/73 119/71  Pulse: (!) 54 (!) 57  Resp: 12 12  Temp:  36.5 C  SpO2: 95% 93%    Last Pain:  Vitals:   11/27/21 1944  TempSrc:   PainSc: 0-No pain                 Audry Pili

## 2021-11-27 NOTE — Transfer of Care (Signed)
Immediate Anesthesia Transfer of Care Note  [name]  Procedure(s) Performed: IRRIGATION AND DEBRIDEMENT foot  3rd ray amputatiion (Left)  Patient Location: PACU  Anesthesia Type:General  Level of Consciousness: awake, alert , oriented and patient cooperative  Airway & Oxygen Therapy: Patient Spontanous Breathing and Patient connected to face mask oxygen  Post-op Assessment: Report given to RN and Post -op Vital signs reviewed and stable  Post vital signs: Reviewed and stable  Last Vitals:  Vitals Value Taken Time  BP 123/75 11/27/21 1845  Temp    Pulse 55 11/27/21 1849  Resp 15 11/27/21 1849  SpO2 100 % 11/27/21 1849  Vitals shown include unvalidated device data.  Last Pain:  Vitals:   11/27/21 1724  TempSrc:   PainSc: 0-No pain      Patients Stated Pain Goal: 2 (65/03/54 6568)  Complications: No notable events documented.

## 2021-11-27 NOTE — Assessment & Plan Note (Signed)
-   Suboxone can be continued.

## 2021-11-27 NOTE — Progress Notes (Signed)
Orthopedic Tech Progress Note Patient Details:  [name] [birth year] [id number]  Patient ID: [name], male   DOB: [birth year], 56 y.o.   MRN: [id number]  [name] 11/27/2021, 6:57 PM Left post op shoe applied in pacu

## 2021-11-27 NOTE — Progress Notes (Signed)
ABI's have been completed. Preliminary results can be found in CV Proc through chart review.   11/27/21 1:53 PM Carlos Levering RVT

## 2021-11-27 NOTE — Progress Notes (Signed)
[facility]                                                                              [name], is a 56 y.o. male, DOB - Dec 30, 1965, TFT:[id number] Admit date - 11/26/2021    Outpatient Primary MD for the patient is [name], MD  LOS - 0  days  Chief Complaint  Patient presents with   Foot Pain       Brief summary   Patient is a 56 year old male with essential hypertension, PVD, lymphedema, neuropathy, OSA, osteoarthritis, HLP, tobacco use, presented to ED with acute onset of redness and swelling with pain surrounding his callus that started on Thursday, 6 days ago.  He was seen in urgent care on Saturday and was given IM Rocephin and prescription for p.o. Keflex.  He felt some improvement over the weekend however since Monday 2 days ago, he noticed that the pain had been worsening with associated swelling and worsening erythema.  He was seen by podiatry on 10/31 and was advised to come to the ER for IV antibiotics and left foot MRI.  Patient admitted for further work-up.  Assessment & Plan    Principal Problem:   Sepsis due to cellulitis [facility]), abscess left foot -Met sepsis criteria on admission due to leukocytosis, tachycardia, mild fever overnight, source likely due to cellulitis and abscess left foot -Follow ABIs -MRI showed rim-enhancing fluid collection along the dorsal aspect of the third toe measuring 5.2x 1.4x 1.0 cm extending into the third webspace towards the plantar forefoot -Podiatry following, n.p.o., plan for I&D -Continue IV vancomycin, cefepime, Flagyl, Suboxone   Active Problems:   Hypertension -BP currently stable     Nondependent opioid abuse (HCC) -Continue Suboxone    Peripheral neuropathy -Continue gabapentin   History of lymphedema -Continue Lasix  Code Status: Full code DVT Prophylaxis:  enoxaparin (LOVENOX) injection 40 mg Start: 11/27/21 1000   Level of Care: Level of care: Med-Surg Family  Communication: Updated patient   Disposition Plan:      Remains inpatient appropriate: Plan for or today   Procedures:  MRI left foot Consultants:   Podiatry  Antimicrobials:   Anti-infectives (From admission, onward)    Start     Dose/Rate Route Frequency Ordered Stop   11/27/21 1400  vancomycin (VANCOREADY) IVPB 2000 mg/400 mL        2,000 mg 200 mL/hr over 120 Minutes Intravenous Every 12 hours 11/27/21 0136     11/27/21 0800  ceFEPIme (MAXIPIME) 2 g in sodium chloride 0.9 % 100 mL IVPB        2 g 200 mL/hr over 30 Minutes Intravenous Every 8 hours 11/27/21 0121     11/27/21 0200  metroNIDAZOLE (FLAGYL) IVPB 500 mg        500 mg 100 mL/hr over 60 Minutes Intravenous Every 12 hours 11/27/21 0120 12/04/21 0159   11/27/21 0130  ceFEPIme (MAXIPIME) 2 g in sodium chloride 0.9 % 100 mL IVPB  Status:  Discontinued        2 g 200 mL/hr over 30 Minutes Intravenous  Once 11/27/21 0120 11/27/21 0121   11/27/21 0130  vancomycin (VANCOCIN) IVPB 1000 mg/200 mL premix  Status:  Discontinued        1,000 mg 200 mL/hr over 60 Minutes Intravenous  Once 11/27/21 0120 11/27/21 0127   11/27/21 0130  vancomycin (VANCOREADY) IVPB 2000 mg/400 mL        2,000 mg 200 mL/hr over 120 Minutes Intravenous  Once 11/27/21 0128 11/27/21 0441   11/27/21 0045  cefTRIAXone (ROCEPHIN) 2 g in sodium chloride 0.9 % 100 mL IVPB  Status:  Discontinued       See Hyperspace for full Linked Orders Report.   2 g 200 mL/hr over 30 Minutes Intravenous Every 24 hours 11/27/21 0043 11/27/21 0135   11/27/21 0045  metroNIDAZOLE (FLAGYL) IVPB 500 mg  Status:  Discontinued       See Hyperspace for full Linked Orders Report.   500 mg 100 mL/hr over 60 Minutes Intravenous Every 12 hours 11/27/21 0043 11/27/21 0136   11/27/21 0030  cefTRIAXone (ROCEPHIN) 2 g in sodium chloride 0.9 % 100 mL IVPB  Status:  Discontinued       See Hyperspace for full Linked Orders Report.   2 g 200 mL/hr over 30 Minutes Intravenous Every 24  hours 11/27/21 0029 11/27/21 0042   11/27/21 0030  metroNIDAZOLE (FLAGYL) IVPB 500 mg  Status:  Discontinued       See Hyperspace for full Linked Orders Report.   500 mg 100 mL/hr over 60 Minutes Intravenous Every 12 hours 11/27/21 0029 11/27/21 0042          Medications  enoxaparin (LOVENOX) injection  40 mg Subcutaneous Q24H   furosemide  40 mg Oral q morning   gabapentin  100 mg Oral QHS   potassium chloride SA  20 mEq Oral Daily      Subjective:   [name] was seen and examined today.  No acute complaints, overnight low-grade temp of 100.1 F.  Still has swelling, tenderness, redness on the left foot.  Patient denies dizziness, chest pain, shortness of breath, abdominal pain, N/V/D/C  Objective:   Vitals:   11/27/21 0230 11/27/21 0302 11/27/21 0752 11/27/21 1126  BP: 125/73 (!) 146/79 (!) 110/54 139/77  Pulse: 69 76 67 64  Resp: _0 Temp: 98.1 F (36.7 C) 100.1 F (37.8 C) 100.1 F (37.8 C) 98.3 F (36.8 C)  TempSrc:  Oral Oral Oral  SpO2: 95% 100% 97% 99%  Weight:  107.1 kg    Height:        Intake/Output Summary (Last 24 hours) at 11/27/2021 1224 Last data filed at 11/27/2021 1505 Gross per 24 hour  Intake 456.07 ml  Output 650 ml  Net -193.93 ml     Wt Readings from Last 3 Encounters:  11/27/21 107.1 kg  03/31/19 115.7 kg  10/14/17 120.2 kg     Exam General: Alert and oriented x 3, NAD Cardiovascular: S1 S2 auscultated,  RRR Respiratory: Clear to auscultation bilaterally, no wheezing, rales or rhonchi Gastrointestinal: Soft, nontender, nondistended, + bowel sounds Ext: no pedal edema bilaterally Neuro: Strength 5/5 upper and lower extremities bilaterally Skin: diffuse left foot swelling with redness more remarkable on the third toe Psych: Normal affect      Data Reviewed:  I have personally reviewed following labs    CBC Lab Results  Component Value Date   WBC 11.4 (H) 11/27/2021   RBC 3.70 (L) 11/27/2021   HGB 11.5  (L) 11/27/2021   HCT 33.7 (L) 11/27/2021   MCV 91.1 11/27/2021  MCH 31.1 11/27/2021   PLT 258 11/27/2021   MCHC 34.1 11/27/2021   RDW 12.0 11/27/2021   LYMPHSABS 1.8 11/26/2021   MONOABS 1.7 (H) 11/26/2021   EOSABS 0.1 11/26/2021   BASOSABS 0.1 40/97/3532     Last metabolic panel Lab Results  Component Value Date   NA 136 11/27/2021   K 3.7 11/27/2021   CL 106 11/27/2021   CO2 24 11/27/2021   BUN 15 11/27/2021   CREATININE 0.67 11/27/2021   GLUCOSE 100 (H) 11/27/2021   GFRNONAA >60 11/27/2021   GFRAA >60 03/31/2019   CALCIUM 8.2 (L) 11/27/2021   PROT 7.2 10/14/2017   ALBUMIN 3.9 10/14/2017   BILITOT 0.6 10/14/2017   ALKPHOS 81 10/14/2017   AST 25 10/14/2017   ALT 27 10/14/2017   ANIONGAP 6 11/27/2021    CBG (last 3)  No results for input(s): "GLUCAP" in the last 72 hours.    Coagulation Profile: Recent Labs  Lab 11/27/21 0541  INR 1.2     Radiology Studies: I have personally reviewed the imaging studies  MR FOOT LEFT W WO CONTRAST  Result Date: 11/27/2021 CLINICAL DATA:  Foot swelling, nondiabetic, osteomyelitis suspected eval for osteomyelitis/abscess EXAM: MRI OF THE LEFT FOREFOOT WITHOUT AND WITH CONTRAST TECHNIQUE: Multiplanar, multisequence MR imaging of the left forefoot was performed both before and after administration of intravenous contrast. CONTRAST:  33m GADAVIST GADOBUTROL 1 MMOL/ML IV SOLN COMPARISON:  Foot radiograph 11/26/2021 FINDINGS: Bones/Joint/Cartilage There is mild marrow edema and enhancement of the third toe distal phalanx. There is preserved T1 marrow signal. There is no other significant marrow signal alteration. Ligaments Intact Lisfranc ligament. Muscles and Tendons Diffuse intramuscular edema and atrophy in the forefoot as is commonly seen in diabetics. Soft tissues Diffuse soft tissue swelling of the foot, and extending most prominently into the second and third toes. There is rim enhancing fluid along the dorsal aspect of the third  toe, area measuring 5.2 x 1.4 x 1.0 cm, and extending in the plantar direction in the third webspace. Extensive adjacent soft tissue edema and enhancement. This appears to communicate with a plantar ulcer (series 9, image 28). The webspace component measures 0.5 cm in width (series 9, image 28). IMPRESSION: Rim enhancing fluid collection along the dorsal aspect of the third toe, area measuring 5.2 x 1.4 x 1.0 cm, which extends into the third webspace towards the plantar forefoot and appears to communicate with a plantar ulcer, suspicious for abscess, correlate with drainage. Extensive adjacent inflammatory change. Mild marrow signal abnormality within the third toe distal phalanx, suspicious for early osteomyelitis. Electronically Signed   By: JMaurine SimmeringM.D.   On: 11/27/2021 08:08   DG Foot Complete Left  Result Date: 11/26/2021 CLINICAL DATA:  Infected wound. Evaluate for soft tissue gas and osteomyelitis. EXAM: LEFT FOOT - COMPLETE 3+ VIEW COMPARISON:  Left foot radiograph dated 02/15/2019. FINDINGS: No acute fracture or dislocation. The bones are osteopenic. No periosteal elevation or bone erosion. There is diffuse soft tissue edema of the dorsum of the foot. No radiopaque foreign object or soft tissue gas. IMPRESSION: 1. No acute fracture or dislocation. No radiographic evidence of acute osteomyelitis. 2. Diffuse soft tissue edema of the dorsum of the foot. Electronically Signed   By: AAnner CreteM.D.   On: 11/26/2021 20:22       Pricilla Moehle M.D. [facility] 11/27/2021, 12:24 PM  Available via Epic secure chat 7am-7pm After 7 pm, please refer to night coverage provider listed on amion.

## 2021-11-27 NOTE — H&P (Signed)
[place]   PATIENT NAME: [name]    MR#:  [id number]  DATE OF BIRTH:  February 17, 1965  DATE OF ADMISSION:  11/26/2021  PRIMARY CARE PHYSICIAN: [name], MD   Patient is coming from: Home  REQUESTING/REFERRING PHYSICIAN: Petrucelli, Glynda Jaeger, PA-C  CHIEF COMPLAINT:   Chief Complaint  Patient presents with   Foot Pain    HISTORY OF PRESENT ILLNESS:  ROHITH [name] is a 56 y.o. Caucasian male with medical history significant for essential hypertension, peripheral venous insufficiency and lymphedema, seasonal allergic rhinitis, OSA, osteoarthritis, dyslipidemia, tobacco abuse and left foot callus, presented to the emergency room with acute onset of redness and swelling with pain surrounding his callus that started on Thursday, for which she was seen in urgent care on Saturday and was given IM Rocephin and was given a prescription for p.o. Keflex.  He felt some improvement over the weekend however since Monday night his pain has been worsening with associated swelling and worsening erythema for which she was seen on Tuesday by Dr. Posey Pronto and was advised to come to the ER for IV antibiotics and left foot MRI.  The patient denies any fever or chills.  No nausea or vomiting or abdominal pain.  No chest pain or dyspnea or cough.  No dysuria, oliguria or hematuria or flank pain.  ED Course: Upon presentation to the emergency room, BP was 132/99 with otherwise normal vital signs.  Labs revealed leukocytosis of 13 with neutrophilia and BMP was within normal.  Blood cultures were sent.  Imaging: Left foot x-ray revealed diffuse soft tissue edema of the dorsum of the foot with no acute fracture or dislocation or radiographic evidence of acute osteomyelitis.  The patient was given IV Rocephin and Flagyl.  He will be admitted to a medical bed for further evaluation and management. PAST MEDICAL HISTORY:  Essential hypertension, peripheral venous insufficiency and lymphedema,  seasonal allergic rhinitis, OSA, osteoarthritis, dyslipidemia, tobacco abuse and left foot callus.  PAST SURGICAL HISTORY:  Shaving of  left foot callus.  SOCIAL HISTORY:   Social History   Tobacco Use   Smoking status: Every Day   Smokeless tobacco: Never  Substance Use Topics   Alcohol use: Never    FAMILY HISTORY:  History reviewed. No pertinent family history.  DRUG ALLERGIES:  No Known Allergies  REVIEW OF SYSTEMS:   ROS As per history of present illness. All pertinent systems were reviewed above. Constitutional, HEENT, cardiovascular, respiratory, GI, GU, musculoskeletal, neuro, psychiatric, endocrine, integumentary and hematologic systems were reviewed and are otherwise negative/unremarkable except for positive findings mentioned above in the HPI.   MEDICATIONS AT HOME:   Prior to Admission medications   Medication Sig Start Date End Date Taking? Authorizing Provider  Buprenorphine HCl-Naloxone HCl 8-2 MG FILM Place 0.5 Film under the tongue in the morning, at noon, in the evening, and at bedtime. 10/30/21  Yes [provider]  cephALEXin (KEFLEX) 500 MG capsule Take 500 mg by mouth 4 (four) times daily. 11/23/21  Yes [provider]  cloNIDine (CATAPRES) 0.1 MG tablet Take 0.1 mg by mouth daily as needed (fatigue). 05/11/19  Yes [provider]  furosemide (LASIX) 40 MG tablet Take 40 mg by mouth every morning. 10/26/21  Yes [provider]  KLOR-CON M20 20 MEQ tablet Take 20 mEq by mouth daily. 03/07/19  Yes [provider]  gabapentin (NEURONTIN) 100 MG capsule Take 100 mg by mouth at bedtime. 01/09/21   [provider]  mupirocin ointment (BACTROBAN) 2 % Apply to wound after soaking BID Patient not taking: Reported on 11/26/2021 05/17/19   [name], John T, DPM      VITAL SIGNS:  Blood pressure (!) 146/79, pulse 76, temperature 100.1 F (37.8 C), temperature source Oral, resp. rate 18, height '6\' 3"'$  (1.905 m), weight  107.1 kg, SpO2 100 %.  PHYSICAL EXAMINATION:  Physical Exam  GENERAL:  56 y.o.-year-old Caucasian male patient lying in the bed with no acute distress.  EYES: Pupils equal, round, reactive to light and accommodation. No scleral icterus. Extraocular muscles intact.  HEENT: Head atraumatic, normocephalic. Oropharynx and nasopharynx clear.  NECK:  Supple, no jugular venous distention. No thyroid enlargement, no tenderness.  LUNGS: Normal breath sounds bilaterally, no wheezing, rales,rhonchi or crepitation. No use of accessory muscles of respiration.  CARDIOVASCULAR: Regular rate and rhythm, S1, S2 normal. No murmurs, rubs, or gallops.  ABDOMEN: Soft, nondistended, nontender. Bowel sounds present. No organomegaly or mass.  EXTREMITIES: Bilateral lower extremity lymphedema with no cyanosis, or clubbing.  NEUROLOGIC: Cranial nerves II through XII are intact. Muscle strength 5/5 in all extremities. Sensation intact. Gait not checked.  PSYCHIATRIC: The patient is alert and oriented x 3.  Normal affect and good eye contact. SKIN: Left plantar foot shaved callus with ulcer ration and surrounding tenderness and swelling as well as diffuse left foot swelling with erythema more remarkable on the third toe       LABORATORY PANEL:   CBC Recent Labs  Lab 11/26/21 1949  WBC 13.0*  HGB 13.1  HCT 38.5*  PLT 287   ------------------------------------------------------------------------------------------------------------------  Chemistries  Recent Labs  Lab 11/26/21 1949  NA 137  K 4.1  CL 102  CO2 27  GLUCOSE 121*  BUN 16  CREATININE 0.75  CALCIUM 8.6*   ------------------------------------------------------------------------------------------------------------------  Cardiac Enzymes No results for input(s): "TROPONINI" in the last 168 hours. ------------------------------------------------------------------------------------------------------------------  RADIOLOGY:  DG Foot  Complete Left  Result Date: 11/26/2021 CLINICAL DATA:  Infected wound. Evaluate for soft tissue gas and osteomyelitis. EXAM: LEFT FOOT - COMPLETE 3+ VIEW COMPARISON:  Left foot radiograph dated 02/15/2019. FINDINGS: No acute fracture or dislocation. The bones are osteopenic. No periosteal elevation or bone erosion. There is diffuse soft tissue edema of the dorsum of the foot. No radiopaque foreign object or soft tissue gas. IMPRESSION: 1. No acute fracture or dislocation. No radiographic evidence of acute osteomyelitis. 2. Diffuse soft tissue edema of the dorsum of the foot. Electronically Signed   By: Anner Crete M.D.   On: 11/26/2021 20:22      IMPRESSION AND PLAN:  Assessment and Plan: * Sepsis due to cellulitis ([place]) - Sepsis is manifested by leukocytosis of 13 and initial heart rate of 95. - The patient will be admitted to a medical telemetry bed. - We will continue antibiotic therapy with IV cefepime, Flagyl and vancomycin given the severity of his cellulitis and pending rule out osteomyelitis. - The patient has been ordered left foot MRI for a.m.,  that is currently pending. - Pain management will be provided. - Warm compresses will be provided. - Follow-up podiatry consult can be called in a.m.  Peripheral neuropathy - We will continue Neurontin.  Nondependent opioid abuse ([place]) - Suboxone can be continued.  Lymphedema - He will continue his diuretic therapy.  Hypertension - He will be placed on as needed IV labetalol.       DVT prophylaxis: Lovenox. Advanced Care Planning:  Code Status: full code. Family Communication:  The plan of care was discussed in details with the patient (and family). I answered all questions. The patient agreed to proceed with the above mentioned plan. Further management will depend upon hospital course. Disposition Plan: Back to previous home environment Consults called: Podiatry consult can be called in AM. All the records are reviewed  and case discussed with ED provider.  Status is: Inpatient   At the time of the admission, it appears that the appropriate admission status for this patient is inpatient.  This is judged to be reasonable and necessary in order to provide the required intensity of service to ensure the patient's safety given the presenting symptoms, physical exam findings and initial radiographic and laboratory data in the context of comorbid conditions.  The patient requires inpatient status due to high intensity of service, high risk of further deterioration and high frequency of surveillance required.  I certify that at the time of admission, it is my clinical judgment that the patient will require inpatient hospital care extending more than 2 midnights.                            Dispo: The patient is from: Home              Anticipated d/c is to: Home              Patient currently is not medically stable to d/c.              Difficult to place patient: No  Christel Mormon M.D on 11/27/2021 at 3:36 AM  [facility]   From 7 PM-7 AM, contact night-coverage [email or web address]  CC: Primary care physician; [name], MD

## 2021-11-27 NOTE — ED Provider Notes (Signed)
[facility] DEPT Provider Note   CSN: [id number] Arrival date & time: 11/26/21  4098     History  Chief Complaint  Patient presents with   Foot Pain    [name] is a 56 y.o. male with a history of tobacco use, venous insufficiency, sleep apnea, hyperlipidemia, and osteoarthritis who presents to the emergency department for worsening left foot infection.  Patient reports intermittent problems with the left foot over the past year, has had issues with callus formation.  Reports that 4 days ago he noted some redness, pain, and swelling to the foot, he was seen in urgent care 3 days prior where he was diagnosed with cellulitis, he was given ceftriaxone and discharged home on Keflex.  Patient reports he has been taking the Keflex as prescribed, unfortunately he has had progressive worsening of the pain, swelling, and redness.  Has had some subjective fever/chills at home.  He was seen by podiatry earlier today and sent to the emergency department for admission for IV antibiotics and MRI.  He denies abdominal pain, nausea, vomiting, or history of diabetes.  HPI     Home Medications Prior to Admission medications   Medication Sig Start Date End Date Taking? Authorizing Provider  Buprenorphine HCl-Naloxone HCl 8-2 MG FILM Place 0.5 Film under the tongue in the morning, at noon, in the evening, and at bedtime. 10/30/21  Yes [provider]  cephALEXin (KEFLEX) 500 MG capsule Take 500 mg by mouth 4 (four) times daily. 11/23/21  Yes [provider]  cloNIDine (CATAPRES) 0.1 MG tablet Take 0.1 mg by mouth daily as needed (fatigue). 05/11/19  Yes [provider]  furosemide (LASIX) 40 MG tablet Take 40 mg by mouth every morning. 10/26/21  Yes [provider]  KLOR-CON M20 20 MEQ tablet Take 20 mEq by mouth daily. 03/07/19  Yes [provider]  gabapentin (NEURONTIN) 100 MG capsule Take 100 mg by mouth at bedtime. 01/09/21    [provider]  mupirocin ointment (BACTROBAN) 2 % Apply to wound after soaking BID Patient not taking: Reported on 11/26/2021 05/17/19   Garrel Ridgel, DPM      Allergies    Patient has no known allergies.    Review of Systems   Review of Systems  Constitutional:  Positive for chills and fever (sweats).  Cardiovascular:  Positive for leg swelling. Negative for chest pain.  Gastrointestinal:  Negative for abdominal pain, nausea and vomiting.  Musculoskeletal:  Positive for arthralgias.  Skin:  Positive for color change and wound.  Neurological:  Negative for syncope.  All other systems reviewed and are negative.   Physical Exam Updated Vital Signs BP (!) 153/80   Pulse 91   Temp 98.2 F (36.8 C)   Resp 17   SpO2 98%  Physical Exam Vitals and nursing note reviewed.  Constitutional:      General: He is not in acute distress.    Appearance: He is well-developed. He is not toxic-appearing.  HENT:     Head: Normocephalic and atraumatic.  Eyes:     General:        Right eye: No discharge.        Left eye: No discharge.     Conjunctiva/sclera: Conjunctivae normal.  Cardiovascular:     Rate and Rhythm: Normal rate and regular rhythm.  Pulmonary:     Effort: No respiratory distress.     Breath sounds: Normal breath sounds. No wheezing or rales.  Abdominal:  General: There is no distension.     Palpations: Abdomen is soft.     Tenderness: There is no abdominal tenderness.  Musculoskeletal:     Cervical back: Neck supple.     Comments: Lower extremities: Patient has wound to the plantar aspect of the left forefoot.  There is erythema, edema, and warmth to the touch extending up to the lower leg.  Tender to palpation throughout the forefoot.  There is some purulence in the webspaces of the digits.  Less than 2-second capillary refill.  2+ DP pulse.  Able to move all digits.  Skin:    General: Skin is warm and dry.  Neurological:     Mental Status: He is alert.      Comments: Clear speech.   Psychiatric:        Behavior: Behavior normal.        Thought Content: Thought content normal.          ED Results / Procedures / Treatments   Labs (all labs ordered are listed, but only abnormal results are displayed) Labs Reviewed  CBC WITH DIFFERENTIAL/PLATELET - Abnormal; Notable for the following components:      Result Value   WBC 13.0 (*)    RBC 4.17 (*)    HCT 38.5 (*)    Neutro Abs 9.3 (*)    Monocytes Absolute 1.7 (*)    Abs Immature Granulocytes 0.10 (*)    All other components within normal limits  BASIC METABOLIC PANEL - Abnormal; Notable for the following components:   Glucose, Bld 121 (*)    Calcium 8.6 (*)    All other components within normal limits  LACTIC ACID, PLASMA  LACTIC ACID, PLASMA    EKG None  Radiology DG Foot Complete Left  Result Date: 11/26/2021 CLINICAL DATA:  Infected wound. Evaluate for soft tissue gas and osteomyelitis. EXAM: LEFT FOOT - COMPLETE 3+ VIEW COMPARISON:  Left foot radiograph dated 02/15/2019. FINDINGS: No acute fracture or dislocation. The bones are osteopenic. No periosteal elevation or bone erosion. There is diffuse soft tissue edema of the dorsum of the foot. No radiopaque foreign object or soft tissue gas. IMPRESSION: 1. No acute fracture or dislocation. No radiographic evidence of acute osteomyelitis. 2. Diffuse soft tissue edema of the dorsum of the foot. Electronically Signed   By: Anner Crete M.D.   On: 11/26/2021 20:22    Procedures Procedures    Medications Ordered in ED Medications - No data to display  ED Course/ Medical Decision Making/ A&P                           Medical Decision Making Amount and/or Complexity of Data Reviewed Radiology: ordered.  Risk Prescription drug management. Decision regarding hospitalization.   Patient presents to the ED with complaints of worsening left lower extremity infection, this involves an extensive number of treatment  options, and is a complaint that carries with it a high risk of complications and morbidity. Nontoxic, vitals w/ elevated BP on arrival- doubt HTN emergency.   Additional history obtained:  Chart/nursing notes reviewed.  External records viewed including: podiatry note from earlier today:  Plan: Patient was evaluated and treated and all questions answered.   Left foot abscess with cellulitis up to the mid leg -All questions and concerns were discussed with the patient in extensive detail. -Patient will benefit from admission i into the hospital for IV antibiotics and an MRI to rule out an  abscess. -He is a high risk of undergoing amputation given the nature of the infection. -Patient will need surgical intervention pending the MRI -He will also benefit from ABIs PVRs to assess vascular flow to the left lower extremity.  His previous one was a year over a year ago -Betadine wet-to-dry dressing -Partial weightbearing to the left heel -Case related to the on-call doctor  Lab Tests:  I viewed & interpreted labs including:  Leukocytosis at 13,000 with left shift BMP: Fairly unremarkable Lactic acid: Within normal limits  Imaging Studies:  I viewed the following imaging, agree with radiologist impression:  Left foot xray:  1. No acute fracture or dislocation. No radiographic evidence of acute osteomyelitis. 2. Diffuse soft tissue edema of the dorsum of the foot  ED Course:  Patient with left lower extremity cellulitis, NVI distally, x-ray without obvious underlying osteomyelitis, MRI ordered to further assess for abscess/osteomyelitis.  Abx ordered utilizing lower extremity order set.   Discussed with hospitalist Dr. Sidney Ace- accepts admission.   Portions of this note were generated with [employer]. Dictation errors may occur despite best attempts at proofreading.   Final Clinical Impression(s) / ED Diagnoses Final diagnoses:  Cellulitis of left lower extremity    Rx /  DC Orders ED Discharge Orders     None         Amaryllis Dyke, PA-C 11/27/21 0053    Quintella Reichert, MD 11/27/21 2017

## 2021-11-27 NOTE — Op Note (Signed)
Full Operative Report  Date of Operation: 6:47 PM, 11/27/2021   [name] - 56 y.o. male  Surgeon: Yevonne Pax, DPM   Assistant: None  Diagnosis: left foot abcess  Procedure:  1. 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  Application of antibiotic drug delivery device, Left foot    Anesthesia: General  Audry Pili, MD  Anesthesiologist: Audry Pili, MD CRNA: Cleda Daub, CRNA; Montel Clock, CRNA   Estimated Blood Loss: Minimal   Hemostasis: 1) Anatomical dissection, mechanical compression, electrocautery 2) No tourniquet was used for this procedure  Implants: Vancomycin antibiotic beads  Materials: prolene  Injectables: 1) Pre-operatively: 10 cc of 0.5% marcaine plain 2) Post-operatively: None  Specimens: Left 3rd toe for pathology, culture swab left foot deep   Antibiotics: Abx given as scheduled from the floor  Drains: None  Complications: Patient tolerated the procedure well without complication.   Findings: as below  Indications for Procedure: FRANCK VINAL presents to [facility], [place] with a chief complaint of left foot redness swelling and drainge from the 3rd toe and forefoot. Concern for abscess and osteomyelitis of the 3rd toe left foot. The patient has failed conservative treatments of various modalities. At this time the patient has elected to proceed with surgical correction. All alternatives, risks, and complications of the procedures were thoroughly explained to the patient. Patient exhibits appropriate understanding of all discussion points and informed consent was signed and obtained in the chart with no guarantees to surgical outcome given or implied.  Description of Procedure: Patient was brought to the operating room and remained on his stretcher in the supine position. Patient was secured to the table with safety belt, a contralateral SCD was placed, and all bony prominences were  well padded. A surgical timeout was performed and all members of the operating room, the procedure, and the surgical site were identified. General anesthesia occurred. Local anesthetic as previously described was then injected about the operative field in a local infiltrative block.   The Left lower extremity was then prepped and draped in the usual sterile manner. The following procedure then began.  Attention was directed to the Left lower extremity. A racquet type incision was made over the dorsal lateral aspect of the 3rd toe and 3rd interspace including the entire digit. A full thickness incision was made down to bone using a #15 blade. The incision was continued through the soft tissue down to the 3rd MPJ. NO purulent drainage was noted near the metatarsal head. A deep wound culture was taken at this time and passed off the field. Using a #15 blade, the digit was then disarticulated in its entirety at the metatarsophalangeal joint and freed of all soft tissue attachments. The specimen was passed off the field and sent for gross pathology. All remaining non-viable and necrotic tissues were sharply resected and removed with rongeur. Extensor and flexors tendons were grasped with a hemostat and cut proximally. The surgical site was then flushed with 3060m of saline with pulse lavage.  Next, vancomycin impregnated antibiotic beads were placed in the amputation site. Next, prolene suture was used to close the wound under minimal tension.   The surgical site was then dressed with betadine adaptic, 4x4 kerlix, ace.  The patient tolerated both the procedure and anesthesia well with vital signs stable throughout. The patient was transferred from the OR to recovery under the discretion of anesthesia.  Condition: Patient was taken to PACU in good  condition and all vital signs stable and neurovascular status intact to the operative limb.  Discharge: Patient will be readmitted to the floor for ongoing  monitoring wound care and abx. The patient will follow the protocol of rest, ice, and elevation. The patient will be limited weightbearing to left foot in a post op shoe to the operative limb until further instructed. The dressing is to remain clean, dry, and intact.   Ames Coupe, DPM

## 2021-11-27 NOTE — Consult Note (Addendum)
PODIATRY CONSULTATION  NAME [name] MRN [id number] DOB [birth year] DOA 11/26/2021   Reason for consult:  Chief Complaint  Patient presents with   Foot Pain    Attending/Consulting physician: Estill Cotta MD  History of present illness: 56 y.o. male with PMHx of essential hypertension, peripheral venous insufficiency and lymphedema, seasonal allergic rhinitis, OSA, osteoarthritis, dyslipidemia, tobacco abuse presents with redness swelling and concern for abscess ot the LLE. Has a callus on the bottom of the left foot for which he was seen in urgent care on Saturday and was given IM Rocephin and was given a prescription for p.o. Keflex.  He felt some improvement over the weekend however since Monday night his pain has been worsening with associated swelling and worsening erythema for which she was seen on Tuesday by Dr. Posey Pronto and was advised to come to the ER for IV antibiotics and left foot MRI.  The patient denies any fever or chills.    History reviewed. No pertinent past medical history.     Latest Ref Rng & Units 11/27/2021    5:41 AM 11/26/2021    7:49 PM 03/31/2019   10:18 AM  CBC  WBC 4.0 - 10.5 K/uL 11.4  13.0  6.5   Hemoglobin 13.0 - 17.0 g/dL 11.5  13.1  13.8   Hematocrit 39.0 - 52.0 % 33.7  38.5  40.5   Platelets 150 - 400 K/uL 258  287  251        Latest Ref Rng & Units 11/27/2021    5:41 AM 11/26/2021    7:49 PM 03/31/2019   10:18 AM  BMP  Glucose 70 - 99 mg/dL 100  121  116   BUN 6 - 20 mg/dL '15  16  16   '$ Creatinine 0.61 - 1.24 mg/dL 0.67  0.75  0.89   Sodium 135 - 145 mmol/L 136  137  136   Potassium 3.5 - 5.1 mmol/L 3.7  4.1  4.4   Chloride 98 - 111 mmol/L 106  102  101   CO2 22 - 32 mmol/L '24  27  26   '$ Calcium 8.9 - 10.3 mg/dL 8.2  8.6  9.1       Physical Exam: Lower Extremity Exam Vasc: R - PT 1/4 palpable, DP 1/4 palpable. Cap refill < 3 sec to digits  L - PT 1/4 palpable, DP 1/4 palpable. Cap refill <3 sec to digits  Derm: R - Normal  temp/texture/turgor with no open lesion or clinical signs of infection   L - Callus plantar left forefoot submet head 3. No malodor or active drainage. Erythema of the left foot and lower leg. Fluctuance of the dorsal forefoot. At the lateral base of the 3rd toe there is necrotic ulceration present probes to the proximal phalanx. Purulent drainage in this area.   MSK:  R -  No gross deformities. Compartments soft, non-tender, compressible  L - Pain with palpation of the left forefoot  Neuro: R - Gross sensation intact. Gross motor function intact   L - Gross sensation intact. Gross motor function intact  MRI Left foot with wo Contrast 11/1 Rim enhancing fluid collection along the dorsal aspect of the third toe, area measuring 5.2 x 1.4 x 1.0 cm, which extends into the third webspace towards the plantar forefoot and appears to communicate with a plantar ulcer, suspicious for abscess, correlate with drainage. Extensive adjacent inflammatory change. Mild marrow signal abnormality within the third toe distal phalanx, suspicious for early  osteomyelitis.  ASSESSMENT/PLAN OF CARE 56 y.o. male with PMHx significant for essential hypertension, peripheral venous insufficiency and lymphedema, seasonal allergic rhinitis, OSA, osteoarthritis, dyslipidemia, tobacco abuse with left foot cellulitis, concern for possible abscess.   - MRI with concern for abscess LEFT foot and possible osteomyelitis of the 3rd toe. Likely recommend I&D with  possible partial 3rd ray amputation.  - NPO for OR this afternoon at approx 1700 or later with Dr. Amalia Hailey or myself pending availability  - Recommend ABI studies, ordered, to assess for LLE arterial supply - Continue IV abx broad spectrum pending further culture data - Anticoagulation: per primary, hold pending OR - Wound care: No dressing needed at present - WB status: WBAT to the LLE - Will continue to follow   Thank you for the consult.  Please contact me directly  with any questions or concerns.           Everitt Amber, DPM [facility]    [address], [place] [address]                Office [phone]  Fax [phone]

## 2021-11-27 NOTE — Assessment & Plan Note (Signed)
-   We will continue Neurontin. ?

## 2021-11-27 NOTE — Anesthesia Procedure Notes (Signed)
Procedure Name: LMA Insertion Date/Time: 11/27/2021 6:04 PM  Performed by: Montel Clock, CRNAPre-anesthesia Checklist: Patient identified, Emergency Drugs available, Suction available, Patient being monitored and Timeout performed Patient Re-evaluated:Patient Re-evaluated prior to induction Oxygen Delivery Method: Circle system utilized Preoxygenation: Pre-oxygenation with 100% oxygen Induction Type: IV induction Ventilation: Mask ventilation without difficulty LMA: LMA with gastric port inserted LMA Size: 4.0 Number of attempts: 1 Dental Injury: Teeth and Oropharynx as per pre-operative assessment

## 2021-11-27 NOTE — Anesthesia Preprocedure Evaluation (Addendum)
Anesthesia Evaluation  Patient identified by MRN, date of birth, ID band Patient awake    Reviewed: Allergy & Precautions, NPO status , Patient's Chart, lab work & pertinent test results  History of Anesthesia Complications Negative for: history of anesthetic complications  Airway Mallampati: II  TM Distance: >3 FB Neck ROM: Full    Dental  (+) Dental Advisory Given   Pulmonary sleep apnea , Current SmokerPatient did not abstain from smoking.,    Pulmonary exam normal        Cardiovascular hypertension, Normal cardiovascular exam     Neuro/Psych  Neuromuscular disease negative psych ROS   GI/Hepatic negative GI ROS, (+)     substance abuse  ,   Endo/Other  negative endocrine ROS  Renal/GU negative Renal ROS     Musculoskeletal  (+) Arthritis , narcotic dependent  Abdominal   Peds  Hematology  (+) Blood dyscrasia, anemia ,   Anesthesia Other Findings   Reproductive/Obstetrics                            Anesthesia Physical Anesthesia Plan  ASA: 3  Anesthesia Plan: General   Post-op Pain Management: Tylenol PO (pre-op)* and Celebrex PO (pre-op)*   Induction: Intravenous  PONV Risk Score and Plan: 2 and Treatment may vary due to age or medical condition, Ondansetron, Midazolam and Diphenhydramine  Airway Management Planned: LMA  Additional Equipment: None  Intra-op Plan:   Post-operative Plan: Extubation in OR  Informed Consent: I have reviewed the patients History and Physical, chart, labs and discussed the procedure including the risks, benefits and alternatives for the proposed anesthesia with the patient or authorized representative who has indicated his/her understanding and acceptance.     Dental advisory given  Plan Discussed with: CRNA and Anesthesiologist  Anesthesia Plan Comments:        Anesthesia Quick Evaluation

## 2021-11-27 NOTE — Progress Notes (Signed)
Pharmacy Antibiotic Note  [name] is a 56 y.o. male admitted on 11/26/2021 with a history of tobacco use, venous insufficiency, sleep apnea, hyperlipidemia, and osteoarthritis who presents to the emergency department for worsening left foot infection.Marland Kitchen  Pharmacy has been consulted to dose vancomycin and cefepime for cellulitis.  Plan: Vancomycin 2gm IV x 1 then 2gm q12h (AUC 499.9, Scr 0.8) Cefepime 2gm IV q8h Follow renal function, cultures and clinical course  Height: '6\' 3"'$  (190.5 cm) Weight: 106.6 kg (235 lb 0.2 oz) IBW/kg (Calculated) : 84.5  Temp (24hrs), Avg:98.2 F (36.8 C), Min:98.2 F (36.8 C), Max:98.2 F (36.8 C)  Recent Labs  Lab 11/26/21 1949  WBC 13.0*  CREATININE 0.75  LATICACIDVEN 1.0    Estimated Creatinine Clearance: 132.8 mL/min (by C-G formula based on SCr of 0.75 mg/dL).    No Known Allergies  Antimicrobials this admission: 11/1 vanc >> 11/1 cefepime >> 11/1 CTX x 1 11/1 flagyl >>  Dose adjustments this admission:   Microbiology results: 11/1 BCx:  Thank you for allowing pharmacy to be a part of this patient's care.  Dolly Rias RPh 11/27/2021, 1:34 AM

## 2021-11-27 NOTE — Assessment & Plan Note (Signed)
-   Sepsis is manifested by leukocytosis of 13 and initial heart rate of 95. - The patient will be admitted to a medical telemetry bed. - We will continue antibiotic therapy with IV cefepime, Flagyl and vancomycin given the severity of his cellulitis and pending rule out osteomyelitis. - The patient has been ordered left foot MRI for a.m.,  that is currently pending. - Pain management will be provided. - Warm compresses will be provided. - Follow-up podiatry consult can be called in a.m.

## 2021-11-27 NOTE — Assessment & Plan Note (Signed)
-   He will be placed on as needed IV labetalol.

## 2021-11-28 ENCOUNTER — Encounter (HOSPITAL_COMMUNITY): Payer: Self-pay | Admitting: Podiatry

## 2021-11-28 ENCOUNTER — Ambulatory Visit: Payer: BC Managed Care – PPO | Admitting: Podiatry

## 2021-11-28 DIAGNOSIS — L03116 Cellulitis of left lower limb: Secondary | ICD-10-CM | POA: Diagnosis not present

## 2021-11-28 DIAGNOSIS — L0390 Cellulitis, unspecified: Secondary | ICD-10-CM | POA: Diagnosis not present

## 2021-11-28 DIAGNOSIS — A419 Sepsis, unspecified organism: Secondary | ICD-10-CM | POA: Diagnosis not present

## 2021-11-28 LAB — VANCOMYCIN, PEAK: Vancomycin Pk: 37 ug/mL (ref 30–40)

## 2021-11-28 NOTE — TOC Progression Note (Signed)
Transition of Care [facility]) - Progression Note    Patient Details  Name: [name] MRN: [id number] Date of Birth: 1965/04/18  Transition of Care [facility]) CM/SW Miller, RN Phone [phone]  11/28/2021, 3:44 PM  Clinical Narrative:     Transition of Care [facility]) Screening Note   Patient Details  Name: [name] Date of Birth: Aug 16, 1965   Transition of Care [facility]) CM/SW Contact:    Angelita Ingles, RN Phone Number: 11/28/2021, 3:45 PM    Transition of Care Department [facility]) has reviewed patient and no TOC needs have been identified at this time. We will continue to monitor patient advancement through interdisciplinary progression rounds. If new patient transition needs arise, please place a TOC consult.          Expected Discharge Plan and Services                                                 Social Determinants of Health (SDOH) Interventions    Readmission Risk Interventions     No data to display

## 2021-11-28 NOTE — Progress Notes (Signed)
[facility]                                                                              [name], is a 56 y.o. male, DOB - 08-Jul-1965, VOJ:[id number] Admit date - 11/26/2021    Outpatient Primary MD for the patient is [name], MD  LOS - 1  days  Chief Complaint  Patient presents with   Foot Pain       Brief summary   Patient is a 56 year old male with essential hypertension, PVD, lymphedema, neuropathy, OSA, osteoarthritis, HLP, tobacco use, presented to ED with acute onset of redness and swelling with pain surrounding his callus that started on Thursday, 6 days ago.  He was seen in urgent care on Saturday and was given IM Rocephin and prescription for p.o. Keflex.  He felt some improvement over the weekend however since Monday 2 days ago, he noticed that the pain had been worsening with associated swelling and worsening erythema.  He was seen by podiatry on 10/31 and was advised to come to the ER for IV antibiotics and left foot MRI.  Patient admitted for further work-up.  Assessment & Plan    Principal Problem:   Sepsis due to cellulitis [facility]), abscess left foot -Met sepsis criteria on admission due to leukocytosis, tachycardia, mild fever overnight, source likely due to cellulitis and abscess left foot -ABIs bilaterally within normal range  -MRI showed rim-enhancing fluid collection along the dorsal aspect of the third toe measuring 5.2x 1.4x 1.0 cm extending into the third webspace towards the plantar forefoot -Podiatry following, underwent amputation of third toe at MPJ level, left foot -Blood cultures negative so far, wound cultures negative so far -Dressing changes possibly today or tomorrow -Continue IV vancomycin, metronidazole, cefepime, will narrow antibiotics per cultures   Active Problems:   Hypertension -BP currently stable     Nondependent opioid abuse (HCC) -Continue Suboxone    Peripheral neuropathy -Continue  gabapentin   History of lymphedema -Continue Lasix  Code Status: Full code DVT Prophylaxis:  enoxaparin (LOVENOX) injection 40 mg Start: 11/27/21 1000   Level of Care: Level of care: Med-Surg Family Communication: Updated patient  Disposition Plan:      Remains inpatient appropriate: On IV antibiotics, plan per podiatry   Procedures:  MRI left foot Amputation of the third toe at MPJ level, left foot on 11/1  Consultants:   Podiatry  Antimicrobials:   Anti-infectives (From admission, onward)    Start     Dose/Rate Route Frequency Ordered Stop   11/27/21 1823  vancomycin (VANCOCIN) powder  Status:  Discontinued          As needed 11/27/21 1824 11/27/21 1951   11/27/21 1400  vancomycin (VANCOREADY) IVPB 2000 mg/400 mL        2,000 mg 200 mL/hr over 120 Minutes Intravenous Every 12 hours 11/27/21 0136     11/27/21 0800  ceFEPIme (MAXIPIME) 2 g in sodium chloride 0.9 % 100 mL IVPB        2 g 200 mL/hr over 30 Minutes Intravenous Every 8 hours 11/27/21 0121     11/27/21 0200  metroNIDAZOLE (FLAGYL)  IVPB 500 mg        500 mg 100 mL/hr over 60 Minutes Intravenous Every 12 hours 11/27/21 0120 12/04/21 0159   11/27/21 0130  ceFEPIme (MAXIPIME) 2 g in sodium chloride 0.9 % 100 mL IVPB  Status:  Discontinued        2 g 200 mL/hr over 30 Minutes Intravenous  Once 11/27/21 0120 11/27/21 0121   11/27/21 0130  vancomycin (VANCOCIN) IVPB 1000 mg/200 mL premix  Status:  Discontinued        1,000 mg 200 mL/hr over 60 Minutes Intravenous  Once 11/27/21 0120 11/27/21 0127   11/27/21 0130  vancomycin (VANCOREADY) IVPB 2000 mg/400 mL        2,000 mg 200 mL/hr over 120 Minutes Intravenous  Once 11/27/21 0128 11/27/21 0441   11/27/21 0045  cefTRIAXone (ROCEPHIN) 2 g in sodium chloride 0.9 % 100 mL IVPB  Status:  Discontinued       See Hyperspace for full Linked Orders Report.   2 g 200 mL/hr over 30 Minutes Intravenous Every 24 hours 11/27/21 0043 11/27/21 0135   11/27/21 0045   metroNIDAZOLE (FLAGYL) IVPB 500 mg  Status:  Discontinued       See Hyperspace for full Linked Orders Report.   500 mg 100 mL/hr over 60 Minutes Intravenous Every 12 hours 11/27/21 0043 11/27/21 0136   11/27/21 0030  cefTRIAXone (ROCEPHIN) 2 g in sodium chloride 0.9 % 100 mL IVPB  Status:  Discontinued       See Hyperspace for full Linked Orders Report.   2 g 200 mL/hr over 30 Minutes Intravenous Every 24 hours 11/27/21 0029 11/27/21 0042   11/27/21 0030  metroNIDAZOLE (FLAGYL) IVPB 500 mg  Status:  Discontinued       See Hyperspace for full Linked Orders Report.   500 mg 100 mL/hr over 60 Minutes Intravenous Every 12 hours 11/27/21 0029 11/27/21 0042          Medications  buprenorphine-naloxone  0.5 tablet Sublingual QID   enoxaparin (LOVENOX) injection  40 mg Subcutaneous Q24H   furosemide  40 mg Oral q morning   gabapentin  100 mg Oral QHS   potassium chloride SA  20 mEq Oral Daily      Subjective:   [name] was seen and examined today.  Currently no acute complaints.  Status post OR on 11/1.  No fevers or chills overnight.  No chest pain, shortness of breath abdominal pain nausea vomiting. + Constipation  Objective:   Vitals:   11/27/21 1944 11/27/21 1945 11/27/21 2124 11/28/21 0527  BP: 119/71 119/71 125/70 128/70  Pulse: (!) 57 (!) 56 65 66  Resp: 12 12 14 14  Temp: 97.7 F (36.5 C)  99 F (37.2 C) 99.6 F (37.6 C)  TempSrc:   Oral Oral  SpO2: 93% 96% 99% 96%  Weight:      Height:        Intake/Output Summary (Last 24 hours) at 11/28/2021 1034 Last data filed at 11/28/2021 0845 Gross per 24 hour  Intake 2002.81 ml  Output 1620 ml  Net 382.81 ml     Wt Readings from Last 3 Encounters:  11/27/21 107 kg  03/31/19 115.7 kg  10/14/17 120.2 kg    Physical Exam General: Alert and oriented x 3, NAD Cardiovascular: S1 S2 clear, RRR.  Respiratory: CTAB, no wheezing Gastrointestinal: Soft, nontender, nondistended, NBS Ext: no pedal edema  bilaterally Neuro: no new deficits Skin: Dressing intact left foot, status post amputation   third toe Psych: Normal affect      Data Reviewed:  I have personally reviewed following labs    CBC Lab Results  Component Value Date   WBC 11.4 (H) 11/27/2021   RBC 3.70 (L) 11/27/2021   HGB 11.5 (L) 11/27/2021   HCT 33.7 (L) 11/27/2021   MCV 91.1 11/27/2021   MCH 31.1 11/27/2021   PLT 258 11/27/2021   MCHC 34.1 11/27/2021   RDW 12.0 11/27/2021   LYMPHSABS 1.8 11/26/2021   MONOABS 1.7 (H) 11/26/2021   EOSABS 0.1 11/26/2021   BASOSABS 0.1 71/06/2692     Last metabolic panel Lab Results  Component Value Date   NA 136 11/27/2021   K 3.7 11/27/2021   CL 106 11/27/2021   CO2 24 11/27/2021   BUN 15 11/27/2021   CREATININE 0.67 11/27/2021   GLUCOSE 100 (H) 11/27/2021   GFRNONAA >60 11/27/2021   GFRAA >60 03/31/2019   CALCIUM 8.2 (L) 11/27/2021   PROT 7.2 10/14/2017   ALBUMIN 3.9 10/14/2017   BILITOT 0.6 10/14/2017   ALKPHOS 81 10/14/2017   AST 25 10/14/2017   ALT 27 10/14/2017   ANIONGAP 6 11/27/2021    CBG (last 3)  No results for input(s): "GLUCAP" in the last 72 hours.    Coagulation Profile: Recent Labs  Lab 11/27/21 0541  INR 1.2     Radiology Studies: I have personally reviewed the imaging studies  DG Foot Complete Left  Result Date: 11/27/2021 CLINICAL DATA:  Post operative left foot irrigation and debridement with 3rd toe amputation EXAM: LEFT FOOT - COMPLETE 3+ VIEW COMPARISON:  MRI left foot 11/27/2021. FINDINGS: Status post first digit phalangeal amputation. No cortical erosion or destruction. There is no evidence of fracture or dislocation. There is no evidence of arthropathy or other focal bone abnormality. Overlying likely antibiotic beads noted overlying the third toe. Subcutaneus soft tissue edema. IMPRESSION: 1. Status post first digit phalangeal amputation. 2. No radiographic findings suggest osteomyelitis. 3.  No acute displaced fracture or  dislocation. Electronically Signed   By: Iven Finn M.D.   On: 11/27/2021 19:48   VAS [place] ABI WITH/WO TBI  Result Date: 11/27/2021  LOWER EXTREMITY DOPPLER STUDY Patient Name:  [name]  Date of Exam:   11/27/2021 Medical Rec #: [id number]          Accession #:    [id number] Date of Birth: 1965/09/12           Patient Gender: M Patient Age:   68 years Exam Location:  [facility] Procedure:      VAS [place] ABI WITH/WO TBI Referring Phys: Sheppard Coil STANDIFORD --------------------------------------------------------------------------------  Indications: Ulceration, and peripheral artery disease. High Risk Factors: Hypertension.  Comparison Study: 09/18/2020 - Right: Resting right ankle-brachial index is                   within normal range. No                   evidence of significant right lower extremity arterial                   disease. The right                   toe-brachial index is normal.                    Left: Resting left ankle-brachial index is within normal  range. No                   evidence of significant left lower extremity arterial disease.                   The left                   toe-brachial index is normal. Performing Technologist: Carlos Levering RVT  Examination Guidelines: A complete evaluation includes at minimum, Doppler waveform signals and systolic blood pressure reading at the level of bilateral brachial, anterior tibial, and posterior tibial arteries, when vessel segments are accessible. Bilateral testing is considered an integral part of a complete examination. Photoelectric Plethysmograph (PPG) waveforms and toe systolic pressure readings are included as required and additional duplex testing as needed. Limited examinations for reoccurring indications may be performed as noted.  ABI Findings: +---------+------------------+-----+---------+--------+ Right    Rt Pressure (mmHg)IndexWaveform Comment   +---------+------------------+-----+---------+--------+ Brachial 144                    triphasic         +---------+------------------+-----+---------+--------+ PTA      157               1.09 triphasic         +---------+------------------+-----+---------+--------+ DP       152               1.06 triphasic         +---------+------------------+-----+---------+--------+ Great Toe95                0.66                   +---------+------------------+-----+---------+--------+ +---------+------------------+-----+---------+-------+ Left     Lt Pressure (mmHg)IndexWaveform Comment +---------+------------------+-----+---------+-------+ Brachial 138                    triphasic        +---------+------------------+-----+---------+-------+ PTA      181               1.26 triphasic        +---------+------------------+-----+---------+-------+ DP       159               1.10 triphasic        +---------+------------------+-----+---------+-------+ Great Toe103               0.72                  +---------+------------------+-----+---------+-------+ +-------+-----------+-----------+------------+------------+ ABI/TBIToday's ABIToday's TBIPrevious ABIPrevious TBI +-------+-----------+-----------+------------+------------+ Right  1.09       0.66       1.03        1.07         +-------+-----------+-----------+------------+------------+ Left   1.26       0.72       1.08        0.77         +-------+-----------+-----------+------------+------------+  Summary: Right: Resting right ankle-brachial index is within normal range. The right toe-brachial index is abnormal. Left: Resting left ankle-brachial index is within normal range. The left toe-brachial index is normal. *See table(s) above for measurements and observations.  Electronically signed by Monica Martinez MD on 11/27/2021 at 4:20:04 PM.    Final    MR FOOT LEFT W WO CONTRAST  Result Date:  11/27/2021 CLINICAL DATA:  Foot swelling, nondiabetic, osteomyelitis suspected eval for osteomyelitis/abscess EXAM: MRI OF THE LEFT FOREFOOT WITHOUT AND  WITH CONTRAST TECHNIQUE: Multiplanar, multisequence MR imaging of the left forefoot was performed both before and after administration of intravenous contrast. CONTRAST:  10mL GADAVIST GADOBUTROL 1 MMOL/ML IV SOLN COMPARISON:  Foot radiograph 11/26/2021 FINDINGS: Bones/Joint/Cartilage There is mild marrow edema and enhancement of the third toe distal phalanx. There is preserved T1 marrow signal. There is no other significant marrow signal alteration. Ligaments Intact Lisfranc ligament. Muscles and Tendons Diffuse intramuscular edema and atrophy in the forefoot as is commonly seen in diabetics. Soft tissues Diffuse soft tissue swelling of the foot, and extending most prominently into the second and third toes. There is rim enhancing fluid along the dorsal aspect of the third toe, area measuring 5.2 x 1.4 x 1.0 cm, and extending in the plantar direction in the third webspace. Extensive adjacent soft tissue edema and enhancement. This appears to communicate with a plantar ulcer (series 9, image 28). The webspace component measures 0.5 cm in width (series 9, image 28). IMPRESSION: Rim enhancing fluid collection along the dorsal aspect of the third toe, area measuring 5.2 x 1.4 x 1.0 cm, which extends into the third webspace towards the plantar forefoot and appears to communicate with a plantar ulcer, suspicious for abscess, correlate with drainage. Extensive adjacent inflammatory change. Mild marrow signal abnormality within the third toe distal phalanx, suspicious for early osteomyelitis. Electronically Signed   By: Jacob  Kahn M.D.   On: 11/27/2021 08:08   DG Foot Complete Left  Result Date: 11/26/2021 CLINICAL DATA:  Infected wound. Evaluate for soft tissue gas and osteomyelitis. EXAM: LEFT FOOT - COMPLETE 3+ VIEW COMPARISON:  Left foot radiograph dated  02/15/2019. FINDINGS: No acute fracture or dislocation. The bones are osteopenic. No periosteal elevation or bone erosion. There is diffuse soft tissue edema of the dorsum of the foot. No radiopaque foreign object or soft tissue gas. IMPRESSION: 1. No acute fracture or dislocation. No radiographic evidence of acute osteomyelitis. 2. Diffuse soft tissue edema of the dorsum of the foot. Electronically Signed   By: Arash  Radparvar M.D.   On: 11/26/2021 20:22       Ripudeep Rai M.D. [facility] 11/28/2021, 10:34 AM  Available via Epic secure chat 7am-7pm After 7 pm, please refer to night coverage provider listed on amion.    

## 2021-11-28 NOTE — Progress Notes (Addendum)
    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  Pain tolerated.  Dressings are clean dry and intact.  No new complaints this evening  Objective: Vital signs in last 24 hours: Temp:  [97.7 F (36.5 C)-99.6 F (37.6 C)] 98.4 F (36.9 C) (11/02 1347) Pulse Rate:  [56-81] 81 (11/02 1347) Resp:  [12-18] 18 (11/02 1347) BP: (119-142)/(68-71) 142/68 (11/02 1347) SpO2:  [93 %-99 %] 96 % (11/02 0527)  Sutures intact.  No drainage.  No malodor.  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  Clean dry and intact -WBAT in a postsurgical shoe -Continue cefepime and vanc as ordered.  Patient okay for transition to oral antibiotics.  Cultures pending -From a podiatry/surgical standpoint patient stable and okay for discharge.     Edrick Kins 11/28/2021, 7:35 PM

## 2021-11-29 DIAGNOSIS — L0390 Cellulitis, unspecified: Secondary | ICD-10-CM | POA: Diagnosis not present

## 2021-11-29 DIAGNOSIS — L03116 Cellulitis of left lower limb: Secondary | ICD-10-CM | POA: Diagnosis not present

## 2021-11-29 DIAGNOSIS — A419 Sepsis, unspecified organism: Secondary | ICD-10-CM | POA: Diagnosis not present

## 2021-11-29 LAB — CREATININE, SERUM
Creatinine, Ser: 0.92 mg/dL (ref 0.61–1.24)
GFR, Estimated: >60 mL/min (ref >60)

## 2021-11-29 LAB — VANCOMYCIN, TROUGH: Vancomycin Tr: 11 ug/mL — ABNORMAL LOW (ref 15–20)

## 2021-11-29 LAB — SURGICAL PATHOLOGY

## 2021-11-29 MED ORDER — DOXYCYCLINE HYCLATE 100 MG PO TABS: 100.0000 mg | ORAL_TABLET | Freq: Two times a day (BID) | ORAL | Status: DC

## 2021-11-29 MED ORDER — DOXYCYCLINE HYCLATE 100 MG PO TABS: 100.0000 mg | ORAL_TABLET | Freq: Two times a day (BID) | ORAL | 0 refills | Status: AC

## 2021-11-29 MED ORDER — VANCOMYCIN HCL 1500 MG/300ML IV SOLN: 1500.0000 mg | Freq: Two times a day (BID) | INTRAVENOUS | Status: DC

## 2021-11-29 NOTE — Progress Notes (Signed)
Mobility Specialist - Progress Note   11/29/21 0908  Mobility  Activity Ambulated with assistance in hallway  Level of Assistance Modified independent, requires aide device or extra time  Assistive Device Front wheel walker;None  Distance Ambulated (ft) 500 ft  Activity Response Tolerated well  Mobility Referral Yes  $Mobility charge 1 Mobility   Pt received standing at sink brushing teeth, and agreed to mobility, no c/o pain nor discomfort, the first 250 feet was with a RW. The last 250 feet was with no AD. Pt had an irregular gait due to the L boot being taller than his R foot with no shoe. Pt back to chair with all needs met.   Roderick Pee Mobility Specialist

## 2021-11-29 NOTE — Discharge Summary (Signed)
Physician Discharge Summary   [name] MRN: [id number] DOB: 02/24/1965  Admit date:     11/26/2021  Discharge date: 11/29/21  Discharge Physician: Estill Cotta, MD    PCP: [name], MD   Recommendations at discharge:   Continue Doxycycline 100 mg p.o. twice daily for 10 more days, outpatient follow-up with podiatry scheduled,  Discharge Diagnoses:    Sepsis due to cellulitis [facility]), abscess left foot   Hypertension   Lymphedema   Nondependent opioid abuse ([place])   Peripheral neuropathy   Cellulitis of left foot   Hospital Course: No notes on file  Assessment and Plan:  Sepsis due to cellulitis [facility]), abscess left foot -Met sepsis criteria on admission due to leukocytosis, tachycardia, mild fever overnight, source likely due to cellulitis and abscess left foot -ABIs bilaterally within normal range  -MRI showed rim-enhancing fluid collection along the dorsal aspect of the third toe measuring 5.2x 1.4x 1.0 cm extending into the third webspace towards the plantar forefoot -Podiatry following, underwent irrigation and debridement foot with third ray amputation on 11/27/2021, postop day # 2 -Blood cultures negative so far, wound cultures negative so far -Dressing changed on 11/2, transition to oral antibiotic doxycycline for 10 days -Cultures will be followed outpatient by podiatry service and adjust antibiotics accordingly.  Outpatient follow-up is scheduled.      Hypertension -BP currently stable       Nondependent opioid abuse (HCC) -Continue Suboxone     Peripheral neuropathy -Continue gabapentin     History of lymphedema -Continue Lasix     Pain control - [place] Controlled Substance Reporting System database was reviewed. and patient was instructed, not to drive, operate heavy machinery, perform activities at heights, swimming or participation in water activities or provide baby-sitting services while on Pain, Sleep and Anxiety  Medications; until their outpatient Physician has advised to do so again. Also recommended to not to take more than prescribed Pain, Sleep and Anxiety Medications.  Consultants: Podiatry Procedures performed: Irrigation and debridement foot, third ray amputation Disposition: Home Diet recommendation:  Discharge Diet Orders (From admission, onward)     Start     Ordered   11/29/21 0000  Diet - low sodium heart healthy        11/29/21 1216            DISCHARGE MEDICATION: Allergies as of 11/29/2021   No Known Allergies      Medication List     STOP taking these medications    cephALEXin 500 MG capsule Commonly known as: KEFLEX   mupirocin ointment 2 % Commonly known as: Bactroban       TAKE these medications    Buprenorphine HCl-Naloxone HCl 8-2 MG Film Place 0.5 Film under the tongue in the morning, at noon, in the evening, and at bedtime.   cloNIDine 0.1 MG tablet Commonly known as: CATAPRES Take 0.1 mg by mouth daily as needed (fatigue).   doxycycline 100 MG tablet Commonly known as: VIBRA-TABS Take 1 tablet (100 mg total) by mouth 2 (two) times daily for 10 days.   furosemide 40 MG tablet Commonly known as: LASIX Take 40 mg by mouth every morning.   gabapentin 100 MG capsule Commonly known as: NEURONTIN Take 100 mg by mouth at bedtime.   Klor-Con M20 20 MEQ tablet Generic drug: potassium chloride SA Take 20 mEq by mouth daily.               Discharge Care Instructions  (From admission,  onward)           Start     Ordered   11/29/21 0000  Leave dressing on - Keep it clean, dry, and intact until clinic visit        11/29/21 1216            Follow-up Information     [name], MD. Schedule an appointment as soon as possible for a visit in 2 week(s).   Specialty: Internal Medicine Why: for hospital follow-up Contact information: [place] [address] [phone]         Felipa Furnace, DPM Follow up on 12/03/2021.   Specialty: Podiatry Why: at 1:15 PM, for hospital follow-up Contact information: [address] [place] [address] [phone]                Discharge Exam: Danley Danker Weights   11/27/21 0100 11/27/21 0302 11/27/21 1724  Weight: 106.6 kg 107.1 kg 107 kg   S: No acute complaints, ready to go home.  No fevers or worsening pain.  Vitals:   11/28/21 0527 11/28/21 1347 11/28/21 2044 11/29/21 0531  BP: 128/70 (!) 142/68 (!) 144/77 131/80  Pulse: 66 81 73 62  Resp: _0 Temp: 99.6 F (37.6 C) 98.4 F (36.9 C) 98.2 F (36.8 C) 99.6 F (37.6 C)  TempSrc: Oral Oral Oral Oral  SpO2: 96%  95% 96%  Weight:      Height:       Physical Exam General: Alert and oriented x 3, NAD Cardiovascular: S1 S2 clear, RRR.  Respiratory: CTAB, no wheezing, rales or rhonchi Gastrointestinal: Soft, nontender, nondistended, NBS Ext: no pedal edema bilaterally Neuro: no new deficits Skin: Dressing intact left foot Psych: Normal affect    Condition at discharge: good  The results of significant diagnostics from this hospitalization (including imaging, microbiology, ancillary and laboratory) are listed below for reference.   Imaging Studies: DG Foot Complete Left  Result Date: 11/27/2021 CLINICAL DATA:  Post operative left foot irrigation and debridement with 3rd toe amputation EXAM: LEFT FOOT - COMPLETE 3+ VIEW COMPARISON:  MRI left foot 11/27/2021. FINDINGS: Status post first digit phalangeal amputation. No cortical erosion or destruction. There is no evidence of fracture or dislocation. There is no evidence of arthropathy or other focal bone abnormality. Overlying likely antibiotic beads noted overlying the third toe. Subcutaneus soft tissue edema. IMPRESSION: 1. Status post first digit phalangeal amputation. 2. No radiographic findings suggest osteomyelitis. 3.  No acute displaced fracture or dislocation. Electronically Signed   By: Iven Finn M.D.   On: 11/27/2021 19:48   VAS [place] ABI WITH/WO TBI  Result Date: 11/27/2021  LOWER EXTREMITY DOPPLER STUDY Patient Name:  ACEA YAGI  Date of Exam:   11/27/2021 Medical Rec #: [id number]          Accession #:    [id number] Date of Birth: 12/25/1965           Patient Gender: M Patient Age:   21 years Exam Location:  [facility] Procedure:      VAS [place] ABI WITH/WO TBI Referring Phys: Sheppard Coil STANDIFORD --------------------------------------------------------------------------------  Indications: Ulceration, and peripheral artery disease. High Risk Factors: Hypertension.  Comparison Study: 09/18/2020 - Right: Resting right ankle-brachial index is                   within normal range. No  evidence of significant right lower extremity arterial                   disease. The right                   toe-brachial index is normal.                    Left: Resting left ankle-brachial index is within normal                   range. No                   evidence of significant left lower extremity arterial disease.                   The left                   toe-brachial index is normal. Performing Technologist: Carlos Levering RVT  Examination Guidelines: A complete evaluation includes at minimum, Doppler waveform signals and systolic blood pressure reading at the level of bilateral brachial, anterior tibial, and posterior tibial arteries, when vessel segments are accessible. Bilateral testing is considered an integral part of a complete examination. Photoelectric Plethysmograph (PPG) waveforms and toe systolic pressure readings are included as required and additional duplex testing as needed. Limited examinations for reoccurring indications may be performed as noted.  ABI Findings: +---------+------------------+-----+---------+--------+ Right    Rt Pressure (mmHg)IndexWaveform Comment  +---------+------------------+-----+---------+--------+ Brachial 144                     triphasic         +---------+------------------+-----+---------+--------+ PTA      157               1.09 triphasic         +---------+------------------+-----+---------+--------+ DP       152               1.06 triphasic         +---------+------------------+-----+---------+--------+ Great Toe95                0.66                   +---------+------------------+-----+---------+--------+ +---------+------------------+-----+---------+-------+ Left     Lt Pressure (mmHg)IndexWaveform Comment +---------+------------------+-----+---------+-------+ Brachial 138                    triphasic        +---------+------------------+-----+---------+-------+ PTA      181               1.26 triphasic        +---------+------------------+-----+---------+-------+ DP       159               1.10 triphasic        +---------+------------------+-----+---------+-------+ Great Toe103               0.72                  +---------+------------------+-----+---------+-------+ +-------+-----------+-----------+------------+------------+ ABI/TBIToday's ABIToday's TBIPrevious ABIPrevious TBI +-------+-----------+-----------+------------+------------+ Right  1.09       0.66       1.03        1.07         +-------+-----------+-----------+------------+------------+ Left   1.26       0.72       1.08        0.77         +-------+-----------+-----------+------------+------------+  Summary: Right: Resting right ankle-brachial index is within normal range. The right toe-brachial index is abnormal. Left: Resting left ankle-brachial index is within normal range. The left toe-brachial index is normal. *See table(s) above for measurements and observations.  Electronically signed by Monica Martinez MD on 11/27/2021 at 4:20:04 PM.    Final    MR FOOT LEFT W WO CONTRAST  Result Date: 11/27/2021 CLINICAL DATA:  Foot swelling, nondiabetic, osteomyelitis suspected eval for  osteomyelitis/abscess EXAM: MRI OF THE LEFT FOREFOOT WITHOUT AND WITH CONTRAST TECHNIQUE: Multiplanar, multisequence MR imaging of the left forefoot was performed both before and after administration of intravenous contrast. CONTRAST:  61m GADAVIST GADOBUTROL 1 MMOL/ML IV SOLN COMPARISON:  Foot radiograph 11/26/2021 FINDINGS: Bones/Joint/Cartilage There is mild marrow edema and enhancement of the third toe distal phalanx. There is preserved T1 marrow signal. There is no other significant marrow signal alteration. Ligaments Intact Lisfranc ligament. Muscles and Tendons Diffuse intramuscular edema and atrophy in the forefoot as is commonly seen in diabetics. Soft tissues Diffuse soft tissue swelling of the foot, and extending most prominently into the second and third toes. There is rim enhancing fluid along the dorsal aspect of the third toe, area measuring 5.2 x 1.4 x 1.0 cm, and extending in the plantar direction in the third webspace. Extensive adjacent soft tissue edema and enhancement. This appears to communicate with a plantar ulcer (series 9, image 28). The webspace component measures 0.5 cm in width (series 9, image 28). IMPRESSION: Rim enhancing fluid collection along the dorsal aspect of the third toe, area measuring 5.2 x 1.4 x 1.0 cm, which extends into the third webspace towards the plantar forefoot and appears to communicate with a plantar ulcer, suspicious for abscess, correlate with drainage. Extensive adjacent inflammatory change. Mild marrow signal abnormality within the third toe distal phalanx, suspicious for early osteomyelitis. Electronically Signed   By: JMaurine SimmeringM.D.   On: 11/27/2021 08:08   DG Foot Complete Left  Result Date: 11/26/2021 CLINICAL DATA:  Infected wound. Evaluate for soft tissue gas and osteomyelitis. EXAM: LEFT FOOT - COMPLETE 3+ VIEW COMPARISON:  Left foot radiograph dated 02/15/2019. FINDINGS: No acute fracture or dislocation. The bones are osteopenic. No periosteal  elevation or bone erosion. There is diffuse soft tissue edema of the dorsum of the foot. No radiopaque foreign object or soft tissue gas. IMPRESSION: 1. No acute fracture or dislocation. No radiographic evidence of acute osteomyelitis. 2. Diffuse soft tissue edema of the dorsum of the foot. Electronically Signed   By: AAnner CreteM.D.   On: 11/26/2021 20:22    Microbiology: Results for orders placed or performed during the hospital encounter of 11/26/21  Blood Cultures x 2 sites     Status: None (Preliminary result)   Collection Time: 11/27/21 12:55 AM   Specimen: BLOOD  Result Value Ref Range Status   Specimen Description   Final    BLOOD BLOOD LEFT ARM Performed at [id number] [place] [id number]   Special Requests   Final    BOTTLES DRAWN AEROBIC AND ANAEROBIC Blood Culture adequate volume Performed at [facility] [place] [address]   Culture   Final    NO GROWTH 2 DAYS Performed at [facility] [place] [address]   Report Status PENDING  Incomplete  Blood Cultures x 2 sites     Status: None (Preliminary result)   Collection  Time: 11/27/21 12:55 AM   Specimen: Right Antecubital; Blood  Result Value Ref Range Status   Specimen Description   Final    RIGHT ANTECUBITAL Performed at [facility], [place] [address] [place], [place] [address]    Special Requests   Final    BOTTLES DRAWN AEROBIC AND ANAEROBIC Blood Culture results may not be optimal due to an excessive volume of blood received in culture bottles Performed at [place] [address], [place], [place] [address]    Culture   Final    NO GROWTH 2 DAYS Performed at [place] [address], [place], [place] [address]    Report Status PENDING  Incomplete  Aerobic/Anaerobic Culture w Gram Stain (surgical/deep wound)     Status: None (Preliminary  result)   Collection Time: 11/27/21  6:48 PM   Specimen: PATH Other; Tissue  Result Value Ref Range Status   Specimen Description   Final    ABSCESS LEFT FOOT 3RD TOE Performed at [facility], [place] [address], [place], [place] [address]    Special Requests   Final    NONE Performed at [facility], [place] [address] [place], [place] [address]    Gram Stain NO WBC SEEN NO ORGANISMS SEEN   Final   Culture   Final    NO GROWTH 2 DAYS Performed at [facility], [place] [address], [place], [place] [address]    Report Status PENDING  Incomplete    Labs: CBC: Recent Labs  Lab 11/26/21 1949 11/27/21 0541  WBC 13.0* 11.4*  NEUTROABS 9.3*  --   HGB 13.1 11.5*  HCT 38.5* 33.7*  MCV 92.3 91.1  PLT 287 341   Basic Metabolic Panel: Recent Labs  Lab 11/26/21 1949 11/27/21 0541 11/29/21 0241  NA 137 136  --   K 4.1 3.7  --   CL 102 106  --   CO2 27 24  --   GLUCOSE 121* 100*  --   BUN 16 15  --   CREATININE 0.75 0.67 0.92  CALCIUM 8.6* 8.2*  --    Liver Function Tests: No results for input(s): "AST", "ALT", "ALKPHOS", "BILITOT", "PROT", "ALBUMIN" in the last 168 hours. CBG: No results for input(s): "GLUCAP" in the last 168 hours.  Discharge time spent: greater than 30 minutes.  Signed: Estill Cotta, MD [facility] 11/29/2021

## 2021-11-29 NOTE — Progress Notes (Signed)
Pharmacy Antibiotic Note  [name] is a 56 y.o. male admitted on 11/26/2021 with a history of tobacco use, venous insufficiency, sleep apnea, hyperlipidemia, and osteoarthritis who presents to the emergency department for worsening left foot infection.Marland Kitchen  Pharmacy has been consulted to dose vancomycin and cefepime for cellulitis.  11/29/2021: -POD2 I&D of L 3rd ray foot amputation -Cx negative to date -Vancomycin levels around 2p dose on 11/2: Pk 37 @ 1752; Tr 11 @ 0241--->calculated AUC 618.2 (above goal of 400-550)  Plan: Decrease Vancomycin to '1500mg'$  IV q12h (estimated AUC 463, Scr 0.92) Continue Cefepime 2gm IV q8h Follow renal function, cultures and clinical course Noted podiatrist recommendations for oral abx at discharge  Height: '6\' 3"'$  (190.5 cm) Weight: 107 kg (235 lb 14.3 oz) IBW/kg (Calculated) : 84.5  Temp (24hrs), Avg:98.7 F (37.1 C), Min:98.2 F (36.8 C), Max:99.6 F (37.6 C)  Recent Labs  Lab 11/26/21 1949 11/27/21 0541 11/28/21 1752 11/29/21 0241  WBC 13.0* 11.4*  --   --   CREATININE 0.75 0.67  --  0.92  LATICACIDVEN 1.0 0.7  --   --   VANCOTROUGH  --   --   --  11*  VANCOPEAK  --   --  37  --      Estimated Creatinine Clearance: 115.7 mL/min (by C-G formula based on SCr of 0.92 mg/dL).    No Known Allergies  Antimicrobials this admission: 11/1 vanc >> 11/1 cefepime >> 11/1 CTX x 1 11/1 flagyl >>  Dose adjustments this admission:  Microbiology results: 11/1 BCx: NGTD 11/1 abscess LLE 3rd toe: NGTD  Thank you for allowing pharmacy to be a part of this patient's care.  Netta Cedars, PharmD, BCPS 11/29/2021, 4:09 AM

## 2021-11-29 NOTE — Progress Notes (Signed)
AVS discussed in full with patient and patient voiced understanding. All questions answered. Patient d/c'ed in stable condition, VSS, no pain. No signs or symptoms of infection. Patient wheelchaired to lobby by NA around 1330.

## 2021-12-02 LAB — CULTURE, BLOOD (ROUTINE X 2)
Culture: NO GROWTH
Culture: NO GROWTH
Special Requests: ADEQUATE

## 2021-12-03 ENCOUNTER — Ambulatory Visit (INDEPENDENT_AMBULATORY_CARE_PROVIDER_SITE_OTHER): Payer: BC Managed Care – PPO | Admitting: Podiatry

## 2021-12-03 DIAGNOSIS — Z89422 Acquired absence of other left toe(s): Secondary | ICD-10-CM

## 2021-12-03 DIAGNOSIS — M549 Dorsalgia, unspecified: Secondary | ICD-10-CM | POA: Diagnosis not present

## 2021-12-03 DIAGNOSIS — F1120 Opioid dependence, uncomplicated: Secondary | ICD-10-CM | POA: Diagnosis not present

## 2021-12-03 DIAGNOSIS — I10 Essential (primary) hypertension: Secondary | ICD-10-CM | POA: Diagnosis not present

## 2021-12-03 DIAGNOSIS — M1990 Unspecified osteoarthritis, unspecified site: Secondary | ICD-10-CM | POA: Diagnosis not present

## 2021-12-03 MED ORDER — DOXYCYCLINE HYCLATE 100 MG PO TABS: 100.0000 mg | ORAL_TABLET | Freq: Two times a day (BID) | ORAL | 0 refills | Status: AC

## 2021-12-04 LAB — AEROBIC/ANAEROBIC CULTURE W GRAM STAIN (SURGICAL/DEEP WOUND): Gram Stain: NONE SEEN

## 2021-12-09 ENCOUNTER — Telehealth: Payer: Self-pay | Admitting: Podiatry

## 2021-12-09 NOTE — Telephone Encounter (Signed)
I am waiting for notes for 12/03/2021 visit to send to patients Laureles.

## 2021-12-10 NOTE — Progress Notes (Signed)
  Subjective:  Patient ID: [name], male    DOB: [birth year],  MRN: [id number]  Chief Complaint  Patient presents with   Routine Post Op    DOS: 11/27/2021 Procedure: Left third digit amputation.  56 y.o. male returns for post-op check.  Patient states that he is doing well.  He has been ambulating a little bit more on the foot.  There is superficial dehiscence.  He is on antibiotics.  Denies any other acute complaints.  Review of Systems: Negative except as noted in the HPI. Denies N/V/F/Ch.  No past medical history on file.  Current Outpatient Medications:    doxycycline (VIBRA-TABS) 100 MG tablet, Take 1 tablet (100 mg total) by mouth 2 (two) times daily., Disp: 60 tablet, Rfl: 0   Buprenorphine HCl-Naloxone HCl 8-2 MG FILM, Place 0.5 Film under the tongue in the morning, at noon, in the evening, and at bedtime., Disp: , Rfl:    cloNIDine (CATAPRES) 0.1 MG tablet, Take 0.1 mg by mouth daily as needed (fatigue)., Disp: , Rfl:    furosemide (LASIX) 40 MG tablet, Take 40 mg by mouth every morning., Disp: , Rfl:    gabapentin (NEURONTIN) 100 MG capsule, Take 100 mg by mouth at bedtime., Disp: , Rfl:    KLOR-CON M20 20 MEQ tablet, Take 20 mEq by mouth daily., Disp: , Rfl:   Social History   Tobacco Use  Smoking Status Every Day  Smokeless Tobacco Never    No Known Allergies Objective:  There were no vitals filed for this visit. There is no height or weight on file to calculate BMI. Constitutional Well developed. Well nourished.  Vascular Foot warm and well perfused. Capillary refill normal to all digits.   Neurologic Normal speech. Oriented to person, place, and time. Epicritic sensation to light touch grossly present bilaterally.  Dermatologic Skin healing well without signs of infection. Skin edges well coapted without signs of infection.  Orthopedic: Tenderness to palpation noted about the surgical site.   Radiographs: None Assessment:   1. History of  amputation of lesser toe of left foot ([place])    Plan:  Patient was evaluated and treated and all questions answered.  S/p foot surgery left -Progressing as expected post-operatively. -XR: See above -WB Status: Weightbearing as tolerated to the heel in surgical shoe -Sutures: Superficial Deis is noted.  Does not probe down to deep tissue or bone.  I encouraged him to do Betadine wet-to-dry dressing changes daily. -Medications: Doxycycline was extended -Foot redressed.  No follow-ups on file.

## 2021-12-11 DIAGNOSIS — F1120 Opioid dependence, uncomplicated: Secondary | ICD-10-CM | POA: Diagnosis not present

## 2021-12-11 DIAGNOSIS — I890 Lymphedema, not elsewhere classified: Secondary | ICD-10-CM | POA: Diagnosis not present

## 2021-12-12 ENCOUNTER — Telehealth: Payer: Self-pay | Admitting: Podiatry

## 2021-12-12 NOTE — Telephone Encounter (Addendum)
Pt called asking about our disability coordinator and I explained she was out of the office unexpectedly. So I asked if I could help him and he said he had talked with her Monday and was waiting on Dr Posey Pronto to fill out the form for his disability.  I did check her box and there was forms for him in there and I told pt I would get Dr Posey Pronto to work on this tomorrow when he is in [place] office and get if faxed over. He said thank you so much.  Upon checking disability coordinators desk she had it filled out just needed the notes and I printed the notes and have faxed it to [facility].

## 2021-12-12 NOTE — Telephone Encounter (Signed)
Notified pt that I found a completed form byt the disability coordinator and faxed it to the [employer] and did get confirmation that it went.

## 2021-12-17 ENCOUNTER — Ambulatory Visit (INDEPENDENT_AMBULATORY_CARE_PROVIDER_SITE_OTHER): Payer: BC Managed Care – PPO | Admitting: Podiatry

## 2021-12-17 DIAGNOSIS — L97512 Non-pressure chronic ulcer of other part of right foot with fat layer exposed: Secondary | ICD-10-CM | POA: Diagnosis not present

## 2021-12-17 NOTE — Progress Notes (Signed)
  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  56 y.o. male returns for post-op check.  Patient states that he is doing well.  He has been ambulating a little bit more on the foot.  There is superficial dehiscence.  He is on antibiotics.  Denies any other acute complaints.  Review of Systems: Negative except as noted in the HPI. Denies N/V/F/Ch.  No past medical history on file.  Current Outpatient Medications:    Buprenorphine HCl-Naloxone HCl 8-2 MG FILM, Place 0.5 Film under the tongue in the morning, at noon, in the evening, and at bedtime., Disp: , Rfl:    cloNIDine (CATAPRES) 0.1 MG tablet, Take 0.1 mg by mouth daily as needed (fatigue)., Disp: , Rfl:    doxycycline (VIBRA-TABS) 100 MG tablet, Take 1 tablet (100 mg total) by mouth 2 (two) times daily., Disp: 60 tablet, Rfl: 0   furosemide (LASIX) 40 MG tablet, Take 40 mg by mouth every morning., Disp: , Rfl:    gabapentin (NEURONTIN) 100 MG capsule, Take 100 mg by mouth at bedtime., Disp: , Rfl:    KLOR-CON M20 20 MEQ tablet, Take 20 mEq by mouth daily., Disp: , Rfl:   Social History   Tobacco Use  Smoking Status Every Day  Smokeless Tobacco Never    No Known Allergies Objective:  There were no vitals filed for this visit. There is no height or weight on file to calculate BMI. Constitutional Well developed. Well nourished.  Vascular Foot warm and well perfused. Capillary refill normal to all digits.   Neurologic Normal speech. Oriented to person, place, and time. Epicritic sensation to light touch grossly present bilaterally.  Dermatologic Superficial dehiscence noted see measurements below.  No signs of infection noted no purulent drainage noted.  Orthopedic: Tenderness to palpation noted about the surgical  site.   Radiographs: None Assessment:   1. Ulcer of right foot with fat layer exposed ([place])     Plan:  Patient was evaluated and treated and all questions answered.  S/p foot surgery left -Progressing as expected post-operatively. -XR: See above -WB Status: Weightbearing as tolerated to the heel in surgical shoe -Sutures: Superficial dehiscence noted measuring 3 cm x 0.5 cm x 0.2 cm.  Continue doing Betadine wet-to-dry dressing until completely reepithelialized -Medications: Continue to doxycycline -If there is no improvement wound will discuss with wound care center during next clinical visit -Minimal debridement was carried out as it is mostly granular in nature  No follow-ups on file.

## 2021-12-31 DIAGNOSIS — F1120 Opioid dependence, uncomplicated: Secondary | ICD-10-CM | POA: Diagnosis not present

## 2021-12-31 DIAGNOSIS — M549 Dorsalgia, unspecified: Secondary | ICD-10-CM | POA: Diagnosis not present

## 2021-12-31 DIAGNOSIS — I10 Essential (primary) hypertension: Secondary | ICD-10-CM | POA: Diagnosis not present

## 2021-12-31 DIAGNOSIS — R600 Localized edema: Secondary | ICD-10-CM | POA: Diagnosis not present

## 2022-01-14 ENCOUNTER — Ambulatory Visit (INDEPENDENT_AMBULATORY_CARE_PROVIDER_SITE_OTHER): Payer: BC Managed Care – PPO | Admitting: Podiatry

## 2022-01-14 DIAGNOSIS — Z89422 Acquired absence of other left toe(s): Secondary | ICD-10-CM

## 2022-01-14 DIAGNOSIS — L97512 Non-pressure chronic ulcer of other part of right foot with fat layer exposed: Secondary | ICD-10-CM | POA: Diagnosis not present

## 2022-01-15 DIAGNOSIS — F1120 Opioid dependence, uncomplicated: Secondary | ICD-10-CM | POA: Diagnosis not present

## 2022-01-21 NOTE — Progress Notes (Signed)
  Subjective:  Patient ID: [name], male    DOB: 28-Apr-1965,  MRN: [id number]  Chief Complaint  Patient presents with   Post-op Follow-up    Post op amputation left foot.DOS 11/25/21     DOS: 11/27/2021 Procedure: Left third digit amputation.  56 y.o. male returns for post-op check.  Patient states that he is doing well.  He has been ambulating a little bit more on the foot.  Patient has been doing Betadine wet-to-dry dressing is seems to be decreasing in size.  Will continue patient has orthotics  Review of Systems: Negative except as noted in the HPI. Denies N/V/F/Ch.  No past medical history on file.  Current Outpatient Medications:    Buprenorphine HCl-Naloxone HCl 8-2 MG FILM, Place 0.5 Film under the tongue in the morning, at noon, in the evening, and at bedtime., Disp: , Rfl:    cloNIDine (CATAPRES) 0.1 MG tablet, Take 0.1 mg by mouth daily as needed (fatigue)., Disp: , Rfl:    furosemide (LASIX) 40 MG tablet, Take 40 mg by mouth every morning., Disp: , Rfl:    gabapentin (NEURONTIN) 100 MG capsule, Take 100 mg by mouth at bedtime., Disp: , Rfl:    KLOR-CON M20 20 MEQ tablet, Take 20 mEq by mouth daily., Disp: , Rfl:   Social History   Tobacco Use  Smoking Status Every Day  Smokeless Tobacco Never    No Known Allergies Objective:  There were no vitals filed for this visit. There is no height or weight on file to calculate BMI. Constitutional Well developed. Well nourished.  Vascular Foot warm and well perfused. Capillary refill normal to all digits.   Neurologic Normal speech. Oriented to person, place, and time. Epicritic sensation to light touch grossly present bilaterally.  Dermatologic Superficial dehiscence noted see measurements below.  No signs of infection noted no purulent drainage noted.  Orthopedic: Tenderness to palpation noted about the surgical site.   Radiographs: None Assessment:   No diagnosis found.   Plan:  Patient was evaluated  and treated and all questions answered.  S/p foot surgery left -Progressing as expected post-operatively. -XR: See above -WB Status: Weightbearing as tolerated to the heel in surgical shoe -Sutures: Superficial dehiscence noted measuring 1.5 cm x 0.5 cm x 0.2 cm.  Continue doing Betadine wet-to-dry dressing until completely reepithelialized.  The wound size is decreasing -Medications: Continue to doxycycline -If there is no improvement wound will discuss with wound care center during next clinical visit -Minimal debridement was carried out as it is mostly granular in nature -Continue disability through January 15  No follow-ups on file.

## 2022-01-28 DIAGNOSIS — F1120 Opioid dependence, uncomplicated: Secondary | ICD-10-CM | POA: Diagnosis not present

## 2022-01-29 DIAGNOSIS — M549 Dorsalgia, unspecified: Secondary | ICD-10-CM | POA: Diagnosis not present

## 2022-01-29 DIAGNOSIS — R600 Localized edema: Secondary | ICD-10-CM | POA: Diagnosis not present

## 2022-01-29 DIAGNOSIS — I10 Essential (primary) hypertension: Secondary | ICD-10-CM | POA: Diagnosis not present

## 2022-01-29 DIAGNOSIS — F1120 Opioid dependence, uncomplicated: Secondary | ICD-10-CM | POA: Diagnosis not present

## 2022-02-07 ENCOUNTER — Ambulatory Visit (INDEPENDENT_AMBULATORY_CARE_PROVIDER_SITE_OTHER): Payer: BC Managed Care – PPO | Admitting: Podiatry

## 2022-02-07 DIAGNOSIS — L97521 Non-pressure chronic ulcer of other part of left foot limited to breakdown of skin: Secondary | ICD-10-CM

## 2022-02-07 NOTE — Progress Notes (Signed)
  Subjective:  Patient ID: [name], male    DOB: 1965/10/13,  MRN: [id number]  Chief Complaint  Patient presents with   Foot Ulcer    57 y.o. male presents with the above complaint.  Patient presents with left submetatarsal 4 ulceration and left hallux ulceration.  Patient states that he has been doing Betadine wet-to-dry dressing has been getting better he is here with the amputation site.  He would like to discuss treatment options for this.  It has not gotten any better and looks about the same.   Review of Systems: Negative except as noted in the HPI. Denies N/V/F/Ch.  No past medical history on file.  Current Outpatient Medications:    Buprenorphine HCl-Naloxone HCl 8-2 MG FILM, Place 0.5 Film under the tongue in the morning, at noon, in the evening, and at bedtime., Disp: , Rfl:    cloNIDine (CATAPRES) 0.1 MG tablet, Take 0.1 mg by mouth daily as needed (fatigue)., Disp: , Rfl:    furosemide (LASIX) 40 MG tablet, Take 40 mg by mouth every morning., Disp: , Rfl:    gabapentin (NEURONTIN) 100 MG capsule, Take 100 mg by mouth at bedtime., Disp: , Rfl:    KLOR-CON M20 20 MEQ tablet, Take 20 mEq by mouth daily., Disp: , Rfl:   Social History   Tobacco Use  Smoking Status Every Day  Smokeless Tobacco Never    No Known Allergies Objective:  There were no vitals filed for this visit. There is no height or weight on file to calculate BMI. Constitutional Well developed. Well nourished.  Vascular Dorsalis pedis pulses palpable bilaterally. Posterior tibial pulses palpable bilaterally. Capillary refill normal to all digits.  No cyanosis or clubbing noted. Pedal hair growth normal.  Neurologic Normal speech. Oriented to person, place, and time. Epicritic sensation to light touch grossly present bilaterally.  Dermatologic Left submetatarsal 4 with fat layer exposed and left hallux distal tip ulceration limited to the breakdown of the skin.  No purulent drainage noted no  redness noted no signs of infection noted.  Orthopedic: Normal joint ROM without pain or crepitus bilaterally. No visible deformities. No bony tenderness.   Radiographs: None Assessment:   1. Ulcer of left foot, limited to breakdown of skin [facility])    Plan:  Patient was evaluated and treated and all questions answered.  Left submetatarsal 4 with fat layer exposed Left hallux ulceration noted limited to the breakdown of the skin -All questions and concerns were discussed with the patient in extensive detail. -Continue Betadine wet-to-dry dressing. -Will benefit from the wound care center for advanced wound care.  Referral was sent in -I will continue to see him until he has been sent over to the wound care center.  He states understanding  No follow-ups on file.

## 2022-02-11 ENCOUNTER — Encounter (HOSPITAL_BASED_OUTPATIENT_CLINIC_OR_DEPARTMENT_OTHER): Payer: BC Managed Care – PPO | Attending: Podiatry | Admitting: Internal Medicine

## 2022-02-11 DIAGNOSIS — I10 Essential (primary) hypertension: Secondary | ICD-10-CM | POA: Diagnosis not present

## 2022-02-11 DIAGNOSIS — G629 Polyneuropathy, unspecified: Secondary | ICD-10-CM | POA: Insufficient documentation

## 2022-02-11 DIAGNOSIS — G4733 Obstructive sleep apnea (adult) (pediatric): Secondary | ICD-10-CM | POA: Diagnosis not present

## 2022-02-11 DIAGNOSIS — G9009 Other idiopathic peripheral autonomic neuropathy: Secondary | ICD-10-CM | POA: Insufficient documentation

## 2022-02-11 DIAGNOSIS — L97522 Non-pressure chronic ulcer of other part of 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: Secondary | ICD-10-CM | POA: Insufficient documentation

## 2022-02-11 DIAGNOSIS — F17200 Nicotine dependence, unspecified, uncomplicated: Secondary | ICD-10-CM | POA: Insufficient documentation

## 2022-02-11 DIAGNOSIS — L02612 Cutaneous abscess of left foot: Secondary | ICD-10-CM | POA: Diagnosis not present

## 2022-02-11 DIAGNOSIS — I872 Venous insufficiency (chronic) (peripheral): Secondary | ICD-10-CM | POA: Diagnosis not present

## 2022-02-11 DIAGNOSIS — L97528 Non-pressure chronic ulcer of other part of left foot with other specified severity: Secondary | ICD-10-CM | POA: Diagnosis not present

## 2022-02-11 NOTE — Progress Notes (Signed)
MICHEIL, KLAUS S ([id number]) [id number] Page 1 of 11 Visit Report for 02/11/2022 Allergy List Details Patient Name: Date of Service: RIELLY, CORLETT 02/11/2022 9:00 A M Medical Record Number: [id number] Patient Account Number: [id number] Date of Birth/Sex: Treating RN: 1965/11/29 (57 y.o. [name] Primary Care Marji Kuehnel: [name] Other Clinician: Referring Adelard Sanon: Treating Mayank Teuscher/Extender: Seward Grater in Treatment: 0 Allergies Active Allergies No Known Drug Allergies Allergy Notes Electronic Signature(s) Signed: 02/11/2022 4:14:29 PM By: Deon Pilling RN, BSN Entered By: Deon Pilling on 02/11/2022 07:36:55 -------------------------------------------------------------------------------- Arrival Information Details Patient Name: Date of Service: [name]. 02/11/2022 9:00 [place] Record Number: [id number] Patient Account Number: [id number] Date of Birth/Sex: Treating RN: 24-Dec-1965 (57 y.o. [name] Primary Care Naudia Crosley: [name] Other Clinician: Referring Christhoper Busbee: Treating Gerard Bonus/Extender: Seward Grater in Treatment: 0 Visit Information Patient Arrived: Ambulatory Arrival Time: 09:30 Accompanied By: self Transfer Assistance: None Patient Identification Verified: Yes Secondary Verification Process Completed: Yes Patient Requires Transmission-Based No Precautions: Patient Has Alerts: Yes Patient Alerts: 11/27/21 ABI L 1.26 R1.09 11/27/2021 TBI L0.72 R0.66 Electronic Signature(s) Signed: 02/11/2022 4:14:29 PM By: Deon Pilling RN, BSN Entered By: Deon Pilling on 02/11/2022 09:34:30 Rosner, Rosie S ([id number]) [id number] Page 2 of 11 -------------------------------------------------------------------------------- Clinic Level of Care Assessment Details Patient Name: Date of Service: YANKY, VANDER[place]G 02/11/2022 9:00  A M Medical Record Number: [id number] Patient Account Number: [id number] Date of Birth/Sex: Treating RN: [birth year] (57 y.o. [name] Primary Care Earnest Thalman: [name] Other Clinician: Referring Kensington Duerst: Treating Koryn Charlot/Extender: Seward Grater in Treatment: 0 Clinic Level of Care Assessment Items TOOL 1 Quantity Score X- 1 0 Use when EandM and Procedure is performed on INITIAL visit ASSESSMENTS - Nursing Assessment / Reassessment X- 1 20 General Physical Exam (combine w/ comprehensive assessment (listed just below) when performed on new pt. evals) X- 1 25 Comprehensive Assessment (HX, ROS, Risk Assessments, Wounds Hx, etc.) ASSESSMENTS - Wound and Skin Assessment / Reassessment '[]'$  - 0 Dermatologic / Skin Assessment (not related to wound area) ASSESSMENTS - Ostomy and/or Continence Assessment and Care '[]'$  - 0 Incontinence Assessment and Management '[]'$  - 0 Ostomy Care Assessment and Management (repouching, etc.) PROCESS - Coordination of Care '[]'$  - 0 Simple Patient / Family Education for ongoing care X- 1 20 Complex (extensive) Patient / Family Education for ongoing care X- 1 10 Staff obtains Programmer, systems, Records, T Results / Process Orders est '[]'$  - 0 Staff telephones HHA, Nursing Homes / Clarify orders / etc '[]'$  - 0 Routine Transfer to another Facility (non-emergent condition) '[]'$  - 0 Routine Hospital Admission (non-emergent condition) X- 1 15 New Admissions / Biomedical engineer / Ordering NPWT Apligraf, etc. , '[]'$  - 0 Emergency Hospital Admission (emergent condition) PROCESS - Special Needs '[]'$  - 0 Pediatric / Minor Patient Management '[]'$  - 0 Isolation Patient Management '[]'$  - 0 Hearing / Language / Visual special needs '[]'$  - 0 Assessment of Community assistance (transportation, D/C planning, etc.) '[]'$  - 0 Additional assistance / Altered mentation '[]'$  - 0 Support Surface(s) Assessment (bed, cushion, seat, etc.) INTERVENTIONS -  Miscellaneous '[]'$  - 0 External ear exam '[]'$  - 0 Patient Transfer (multiple staff / Civil Service fast streamer / Similar devices) '[]'$  - 0 Simple Staple / Suture removal (25 or less) '[]'$  - 0 Complex Staple / Suture removal (26 or more) '[]'$  - 0 Hypo/Hyperglycemic Management (do not check if billed separately) '[]'$  - 0 Ankle / Brachial Index (ABI) - do not check if  billed separately Has the patient been seen at the hospital within the last three years: Yes Total Score: 90 Level Of Care: New/Established - Level 3 Electronic Signature(s) Signed: 02/11/2022 4:14:29 PM By: Deon Pilling RN, BSN Entered By: Deon Pilling on 02/11/2022 10:20:52 Jaggers, Antionette Char ([id number]) [id number] Page 3 of 11 -------------------------------------------------------------------------------- Encounter Discharge Information Details Patient Name: Date of Service: KAINALU, HEGGS 02/11/2022 9:00 A M Medical Record Number: [id number] Patient Account Number: [id number] Date of Birth/Sex: Treating RN: [birth year] (57 y.o. [name] Primary Care Samah Lapiana: [name] Other Clinician: Referring Ferd Horrigan: Treating Trase Bunda/Extender: Seward Grater in Treatment: 0 Encounter Discharge Information Items Post Procedure Vitals Discharge Condition: Stable Temperature (F): 98.6 Ambulatory Status: Ambulatory Pulse (bpm): 76 Discharge Destination: Home Respiratory Rate (breaths/min): 20 Transportation: Private Auto Blood Pressure (mmHg): 160/93 Accompanied By: self Schedule Follow-up Appointment: Yes Clinical Summary of Care: Electronic Signature(s) Signed: 02/11/2022 4:14:29 PM By: Deon Pilling RN, BSN Entered By: Deon Pilling on 02/11/2022 10:21:36 -------------------------------------------------------------------------------- Lower Extremity Assessment Details Patient Name: Date of Service: [name]. 02/11/2022 9:00 [place] Record Number:  [id number] Patient Account Number: [id number] Date of Birth/Sex: Treating RN: 1965-08-04 (57 y.o. [name] Primary Care Azalyn Sliwa: [name] Other Clinician: Referring Maitland Muhlbauer: Treating Waneda Klammer/Extender: Seward Grater in Treatment: 0 Edema Assessment Assessed: [name]: Yes] [name]: No] Edema: [Left: Ye] [Right: s] Calf Left: Right: Point of Measurement: 43 cm From Medial Instep 39.9 cm Ankle Left: Right: Point of Measurement: 11 cm From Medial Instep 31.1 cm Knee To Floor Left: Right: From Medial Instep 47 cm Vascular Assessment Pulses: Dorsalis Pedis Palpable: [Left:Yes] Electronic Signature(s) Signed: 02/11/2022 4:14:29 PM By: Deon Pilling RN, BSN Entered By: Deon Pilling on 02/11/2022 09:50:33 Lieber, Holly S ([id number]) [id number] Page 4 of 11 -------------------------------------------------------------------------------- Multi Wound Chart Details Patient Name: Date of Service: MOISHE, SCHELLENBERG 02/11/2022 9:00 A M Medical Record Number: [id number] Patient Account Number: [id number] Date of Birth/Sex: Treating RN: May 25, 1965 (57 y.o. M) Primary Care Laquincy Eastridge: [name] Other Clinician: Referring Omari Mcmanaway: Treating Damascus Feldpausch/Extender: Seward Grater in Treatment: 0 Vital Signs Height(in): 75 Pulse(bpm): 22 Weight(lbs): 220 Blood Pressure(mmHg): 160/93 Body Mass Index(BMI): 27.5 Temperature(F): 98.6 Respiratory Rate(breaths/min): 20 [1:Photos:] [N/A:N/A] Left T Great oe Left, Plantar Foot N/A Wound Location: Shear/Friction Surgical Injury N/A Wounding Event: Neuropathic Ulcer-Non Diabetic Open Surgical Wound N/A Primary Etiology: Lymphedema, Sleep Apnea, Lymphedema, Sleep Apnea, N/A Comorbid History: Hypertension, Peripheral Venous Hypertension, Peripheral Venous Disease, Osteoarthritis, Neuropathy Disease, Osteoarthritis, Neuropathy 01/28/2022 01/14/2022  N/A Date Acquired: 0 0 N/A Weeks of Treatment: Open Open N/A Wound Status: No No N/A Wound Recurrence: Yes Yes N/A Pending A mputation on Presentation: 1.1x1.4x0.1 0.5x0.3x0.2 N/A Measurements L x W x D (cm) 1.21 0.118 N/A A (cm) : rea 0.121 0.024 N/A Volume (cm) : 2 Starting Position 1 (o'clock): 3 Ending Position 1 (o'clock): 0.2 Maximum Distance 1 (cm): No Yes N/A Undermining: Full Thickness Without Exposed Full Thickness Without Exposed N/A Classification: Support Structures Support Structures Medium Medium N/A Exudate A mount: Serosanguineous Serosanguineous N/A Exudate Type: red, brown red, brown N/A Exudate Color: Distinct, outline attached Distinct, outline attached N/A Wound Margin: Medium (34-66%) Medium (34-66%) N/A Granulation A mount: Red, Pink Red, Pink N/A Granulation Quality: Medium (34-66%) Medium (34-66%) N/A Necrotic A mount: Fat Layer (Subcutaneous Tissue): Yes Fat Layer (Subcutaneous Tissue): Yes N/A Exposed Structures: Fascia: No Fascia: No Tendon: No Tendon: No Muscle: No Muscle: No Joint: No Joint: No Bone: No Bone: No Small (1-33%)  None N/A Epithelialization: Chemical/Enzymatic/Mechanical Debridement - Excisional N/A Debridement: Pre-procedure Verification/Time Out N/A 10:10 N/A Taken: N/A Lidocaine 4% Topical Solution N/A Pain Control: N/A Callus, Subcutaneous N/A Tissue Debrided: N/A Skin/Subcutaneous Tissue N/A Level: N/A 0.5 N/A Debridement A (sq cm): rea N/A Curette N/A Instrument: None Minimum N/A Bleeding: N/A 0 N/A Procedural PainLANSON, RANDLE S ([id number]) [id number] Page 5 of 11 N/A 0 N/A Post Procedural Pain: Procedure was tolerated well Procedure was tolerated well N/A Debridement Treatment Response: 1.1x1.4x0.1 0.5x0.3x0.2 N/A Post Debridement Measurements L x W x D (cm) 0.121 0.024 N/A Post Debridement Volume: (cm) Callus: Yes Callus: Yes N/A Periwound Skin  Texture: Excoriation: No Excoriation: No Induration: No Induration: No Crepitus: No Crepitus: No Rash: No Rash: No Scarring: No Scarring: No Dry/Scaly: Yes Maceration: No N/A Periwound Skin Moisture: Maceration: No Dry/Scaly: No Atrophie Blanche: No Atrophie Blanche: No N/A Periwound Skin Color: Cyanosis: No Cyanosis: No Ecchymosis: No Ecchymosis: No Erythema: No Erythema: No Hemosiderin Staining: No Hemosiderin Staining: No Mottled: No Mottled: No Pallor: No Pallor: No Rubor: No Rubor: No Debridement Debridement N/A Procedures Performed: Treatment Notes Wound #1 (Toe Great) Wound Laterality: Left Cleanser Wound Cleanser Discharge Instruction: Cleanse the wound with wound cleanser prior to applying a clean dressing using gauze sponges, not tissue or cotton balls. [facility] Ancillary Kit - 15 Day Supply Discharge Instruction: Use supplies as instructed; Kit contains: (15) Saline Bullets; (15) 3x3 Gauze; 15 pr Gloves Peri-Wound Care Topical Primary Dressing Hydrofera Blue Ready Transfer Foam, 2.5x2.5 (in/in) Discharge Instruction: Apply directly to wound bed as directed MediHoney Gel, tube 1.5 (oz) Discharge Instruction: Apply to wound bed as instructed Secondary Dressing Optifoam Non-Adhesive Dressing, 4x4 in Discharge Instruction: Apply x1 layer donut to aid in offloading. Woven Gauze Sponges 2x2 in Discharge Instruction: Apply over primary dressing as directed. Secured With Conforming Stretch Gauze Bandage, Sterile 2x75 (in/in) Discharge Instruction: Secure with stretch gauze as directed. 81M Medipore H Soft Cloth Surgical T ape, 4 x 10 (in/yd) Discharge Instruction: Secure with tape as directed. Compression Wrap Compression Stockings Add-Ons Wound #2 (Foot) Wound Laterality: Plantar, Left Cleanser Wound Cleanser Discharge Instruction: Cleanse the wound with wound cleanser prior to applying a clean dressing using gauze sponges, not tissue or cotton  balls. [facility] Ancillary Kit - 15 Day Supply Discharge Instruction: Use supplies as instructed; Kit contains: (15) Saline Bullets; (15) 3x3 Gauze; 15 pr Gloves Peri-Wound Care Topical Primary Dressing Hydrofera Blue Ready Transfer Foam, 2.5x2.5 (in/in) Discharge Instruction: Apply directly to wound bed as directed MediHoney Gel, tube 1.5 (oz) Lightner, Aikam S ([id number]) [id number] Page 6 of 11 Discharge Instruction: Apply to wound bed as instructed Secondary Dressing Optifoam Non-Adhesive Dressing, 4x4 in Discharge Instruction: Apply x1 layer donut to aid in offloading. Woven Gauze Sponges 2x2 in Discharge Instruction: Apply over primary dressing as directed. Secured With Conforming Stretch Gauze Bandage, Sterile 2x75 (in/in) Discharge Instruction: Secure with stretch gauze as directed. 81M Medipore H Soft Cloth Surgical T ape, 4 x 10 (in/yd) Discharge Instruction: Secure with tape as directed. Compression Wrap Compression Stockings Add-Ons Electronic Signature(s) Signed: 02/11/2022 10:52:56 AM By: Kalman Shan DO Entered By: Kalman Shan on 02/11/2022 10:23:46 -------------------------------------------------------------------------------- Multi-Disciplinary Care Plan Details Patient Name: Date of Service: [name]. 02/11/2022 9:00 A M Medical Record Number: [id number] Patient Account Number: [id number] Date of Birth/Sex: Treating RN: Feb 10, 1965 (57 y.o. [name] Primary Care Teagyn Fishel: [name] Other Clinician: Referring Keighley Deckman: Treating Allisson Schindel/Extender: Seward Grater in Treatment: 0 Active Inactive Orientation to the Wound  Care Program Nursing Diagnoses: Knowledge deficit related to the wound healing center program Goals: Patient/caregiver will verbalize understanding of the [facility] Program Date Initiated: 02/11/2022 Target Resolution Date: 03/07/2022 Goal Status:  Active Interventions: Provide education on orientation to the wound center Notes: Peripheral Neuropathy Nursing Diagnoses: Potential alteration in peripheral tissue perfusion (select prior to confirmation of diagnosis) Goals: Patient/caregiver will verbalize understanding of disease process and disease management Date Initiated: 02/11/2022 Target Resolution Date: 03/07/2022 Goal Status: Active Interventions: Assess signs and symptoms of neuropathy upon admission and as needed Provide education on Management of Neuropathy upon discharge from the [facility] Treatment Activities: RINGO, SHEROD ([id number]) [id number] Page 7 of 11 Patient referred for customized footwear/offloading : 02/11/2022 Notes: Wound/Skin Impairment Nursing Diagnoses: Knowledge deficit related to ulceration/compromised skin integrity Goals: Patient/caregiver will verbalize understanding of skin care regimen Date Initiated: 02/11/2022 Target Resolution Date: 03/07/2022 Goal Status: Active Interventions: Assess patient/caregiver ability to perform ulcer/skin care regimen upon admission and as needed Assess ulceration(s) every visit Provide education on smoking Provide education on ulcer and skin care Treatment Activities: Skin care regimen initiated : 02/11/2022 Smoking cessation education : 02/11/2022 Topical wound management initiated : 02/11/2022 Notes: Electronic Signature(s) Signed: 02/11/2022 4:14:29 PM By: Deon Pilling RN, BSN Entered By: Deon Pilling on 02/11/2022 10:12:11 -------------------------------------------------------------------------------- Pain Assessment Details Patient Name: Date of Service: [name]. 02/11/2022 9:00 [place] Record Number: [id number] Patient Account Number: [id number] Date of Birth/Sex: Treating RN: 05/12/1965 (57 y.o. [name] Primary Care Taraya Steward: [name] Other Clinician: Referring Chasitty Hehl: Treating  Rollin Kotowski/Extender: Seward Grater in Treatment: 0 Active Problems Location of Pain Severity and Description of Pain Patient Has Paino No Site Locations Rate the pain. Current Pain Level: 0 Pain Management and Medication Current Pain Management: Medication: No Cold Application: No Rest: No Massage: No KABLE, HAYWOOD S ([id number]) [id number] Page 8 of 11 Activity: No T.E.N.S.: No Heat Application: No Leg drop or elevation: No Is the Current Pain Management Adequate: Adequate How does your wound impact your activities of daily livingo Sleep: No Bathing: No Appetite: No Relationship With Others: No Bladder Continence: No Emotions: No Bowel Continence: No Work: No Toileting: No Drive: No Dressing: No Hobbies: No Electronic Signature(s) Signed: 02/11/2022 4:14:29 PM By: Deon Pilling RN, BSN Entered By: Deon Pilling on 02/11/2022 09:52:17 -------------------------------------------------------------------------------- Patient/Caregiver Education Details Patient Name: Date of Service: Handrich, Nyzier S. 1/16/2024andnbsp9:00 [place] Record Number: [id number] Patient Account Number: [id number] Date of Birth/Gender: Treating RN: 07-06-1965 (57 y.o. [name] Primary Care Physician: [name] Other Clinician: Referring Physician: Treating Physician/Extender: Seward Grater in Treatment: 0 Education Assessment Education Provided To: Patient Education Topics Provided Wound/Skin Impairment: Handouts: Caring for Your Ulcer Methods: Explain/Verbal Responses: Reinforcements needed Electronic Signature(s) Signed: 02/11/2022 4:14:29 PM By: Deon Pilling RN, BSN Entered By: Deon Pilling on 02/11/2022 10:12:19 -------------------------------------------------------------------------------- Wound Assessment Details Patient Name: Date of Service: [name]. 02/11/2022 9:00 A  M Medical Record Number: [id number] Patient Account Number: [id number] Date of Birth/Sex: Treating RN: 08/06/1965 (57 y.o. [name] Primary Care Leesha Veno: [name] Other Clinician: Referring Ibrahem Volkman: Treating Laurice Iglesia/Extender: Seward Grater in Treatment: 0 Wound Status Wound Number: 1 Primary Neuropathic Ulcer-Non Diabetic Etiology: Wound Location: Left T Great oe Wound Open Wounding Event: Shear/Friction Status: Date Acquired: 01/28/2022 Comorbid Lymphedema, Sleep Apnea, Hypertension, Peripheral Venous Weeks Of TreatmentELDRIDGE, MARCOTT S ([id number]) [id number] Page 9 of 11 Weeks Of Treatment: 0 History: Disease, Osteoarthritis, Neuropathy  Clustered Wound: No Photos Wound Measurements Length: (cm) 1.1 Width: (cm) 1.4 Depth: (cm) 0.1 Area: (cm) 1.21 Volume: (cm) 0.121 % Reduction in Area: % Reduction in Volume: Epithelialization: Small (1-33%) Tunneling: No Undermining: No Wound Description Classification: Full Thickness Without Exposed Suppor Wound Margin: Distinct, outline attached Exudate Amount: Medium Exudate Type: Serosanguineous Exudate Color: red, brown t Structures Foul Odor After Cleansing: No Slough/Fibrino Yes Wound Bed Granulation Amount: Medium (34-66%) Exposed Structure Granulation Quality: Red, Pink Fascia Exposed: No Necrotic Amount: Medium (34-66%) Fat Layer (Subcutaneous Tissue) Exposed: Yes Necrotic Quality: Adherent Slough Tendon Exposed: No Muscle Exposed: No Joint Exposed: No Bone Exposed: No Periwound Skin Texture Texture Color No Abnormalities Noted: No No Abnormalities Noted: No Callus: Yes Atrophie Blanche: No Crepitus: No Cyanosis: No Excoriation: No Ecchymosis: No Induration: No Erythema: No Rash: No Hemosiderin Staining: No Scarring: No Mottled: No Pallor: No Moisture Rubor: No No Abnormalities Noted: No Dry / Scaly: Yes Maceration:  No Electronic Signature(s) Signed: 02/11/2022 4:14:29 PM By: Deon Pilling RN, BSN Entered By: Deon Pilling on 02/11/2022 09:52:04 -------------------------------------------------------------------------------- Wound Assessment Details Patient Name: Date of Service: [name]. 02/11/2022 9:00 [place] Record Number: [id number] Patient Account Number: [id number] Date of Birth/Sex: Treating RN: 1965/11/20 (57 y.o. [name] Primary Care Cipriana Biller: [name] Other Clinician: Referring Yael Coppess: Treating Dedria Endres/Extender: Seward Grater in Treatment: 0 [place], [place] S ([id number]) [id number] Page 10 of 11 Wound Status Wound Number: 2 Primary Open Surgical Wound Etiology: Wound Location: Left, Plantar Foot Wound Open Wounding Event: Surgical Injury Status: Date Acquired: 01/14/2022 Comorbid Lymphedema, Sleep Apnea, Hypertension, Peripheral Venous Weeks Of Treatment: 0 History: Disease, Osteoarthritis, Neuropathy Clustered Wound: No Pending Amputation On Presentation Photos Wound Measurements Length: (cm) 0.5 Width: (cm) 0.3 Depth: (cm) 0.2 Area: (cm) 0.118 Volume: (cm) 0.024 % Reduction in Area: % Reduction in Volume: Epithelialization: None Tunneling: No Undermining: Yes Starting Position (o'clock): 2 Ending Position (o'clock): 3 Maximum Distance: (cm) 0.2 Wound Description Classification: Full Thickness Without Exposed Support Structures Wound Margin: Distinct, outline attached Exudate Amount: Medium Exudate Type: Serosanguineous Exudate Color: red, brown Foul Odor After Cleansing: No Slough/Fibrino Yes Wound Bed Granulation Amount: Medium (34-66%) Exposed Structure Granulation Quality: Red, Pink Fascia Exposed: No Necrotic Amount: Medium (34-66%) Fat Layer (Subcutaneous Tissue) Exposed: Yes Necrotic Quality: Adherent Slough Tendon Exposed: No Muscle Exposed: No Joint Exposed:  No Bone Exposed: No Periwound Skin Texture Texture Color No Abnormalities Noted: No No Abnormalities Noted: No Callus: Yes Atrophie Blanche: No Crepitus: No Cyanosis: No Excoriation: No Ecchymosis: No Induration: No Erythema: No Rash: No Hemosiderin Staining: No Scarring: No Mottled: No Pallor: No Moisture Rubor: No No Abnormalities Noted: No Dry / Scaly: No Maceration: No Treatment Notes Wound #2 (Foot) Wound Laterality: Plantar, Left Cleanser Wound Cleanser Discharge Instruction: Cleanse the wound with wound cleanser prior to applying a clean dressing using gauze sponges, not tissue or cotton balls. [facility] Ancillary Kit - [address] NAKSH, RADI S ([id number]) [id number] Page 11 of 11 Discharge Instruction: Use supplies as instructed; Kit contains: (15) Saline Bullets; (15) 3x3 Gauze; 15 pr Gloves Peri-Wound Care Topical Primary Dressing Hydrofera Blue Ready Transfer Foam, 2.5x2.5 (in/in) Discharge Instruction: Apply directly to wound bed as directed MediHoney Gel, tube 1.5 (oz) Discharge Instruction: Apply to wound bed as instructed Secondary Dressing Optifoam Non-Adhesive Dressing, 4x4 in Discharge Instruction: Apply x1 layer donut to aid in offloading. Woven Gauze Sponges 2x2 in Discharge Instruction: Apply over primary dressing as directed. Secured With Child psychotherapist,  Sterile 2x75 (in/in) Discharge Instruction: Secure with stretch gauze as directed. 84M Medipore H Soft Cloth Surgical T ape, 4 x 10 (in/yd) Discharge Instruction: Secure with tape as directed. Compression Wrap Compression Stockings Add-Ons Electronic Signature(s) Signed: 02/11/2022 4:14:29 PM By: Deon Pilling RN, BSN Entered By: Deon Pilling on 02/11/2022 10:15:10 -------------------------------------------------------------------------------- Vitals Details Patient Name: Date of Service: [name]. 02/11/2022 9:00 A M Medical  Record Number: [id number] Patient Account Number: [id number] Date of Birth/Sex: Treating RN: 01/06/1966 (57 y.o. [name] Primary Care Noble Bodie: [name] Other Clinician: Referring Hilbert Briggs: Treating Aidynn Krenn/Extender: Seward Grater in Treatment: 0 Vital Signs Time Taken: 09:32 Temperature (F): 98.6 Height (in): 75 Pulse (bpm): 78 Source: Stated Respiratory Rate (breaths/min): 20 Weight (lbs): 220 Blood Pressure (mmHg): 160/93 Source: Stated Reference Range: 80 - 120 mg / dl Body Mass Index (BMI): 27.5 Electronic Signature(s) Signed: 02/11/2022 4:14:29 PM By: Deon Pilling RN, BSN Entered By: Deon Pilling on 02/11/2022 09:50:40

## 2022-02-11 NOTE — Progress Notes (Addendum)
[name], [name] ([id number]) [id number] Page 1 of 10 Visit Report for 02/11/2022 Chief Complaint Document Details Patient Name: Date of Service: [name] 02/11/2022 9:00 A M Medical Record Number: [id number] Patient Account Number: [id number] Date of Birth/Sex: Treating RN: 11-15-1965 (57 y.o. M) Primary Care Provider: [name] Other Clinician: Referring Provider: Treating Provider/Extender: [name] in Treatment: 0 Information Obtained from: Patient Chief Complaint 02/11/2022 left great toe wound and fourth sub-met head wound Electronic Signature([name]) Signed: 02/11/2022 10:52:56 AM By: Kalman Shan DO Entered By: Kalman Shan on 02/11/2022 10:24:29 -------------------------------------------------------------------------------- Debridement Details Patient Name: Date of Service: [name]. 02/11/2022 9:00 New Post Record Number: [id number] Patient Account Number: [id number] Date of Birth/Sex: Treating RN: 08-Aug-1965 (57 y.o. [name] Primary Care Provider: [name] Other Clinician: Referring Provider: Treating Provider/Extender: [name]: 0 Debridement Performed for Assessment: Wound #2 Left,Plantar Foot Performed By: Physician Kalman Shan, DO Debridement Type: Debridement Level of Consciousness (Pre-procedure): Awake and Alert Pre-procedure Verification/Time Out Yes - 10:10 Taken: Start Time: 10:11 Pain Control: Lidocaine 4% T opical Solution T Area Debrided (L x W): otal 1 (cm) x 0.5 (cm) = 0.5 (cm) Tissue and other material debrided: Viable, Non-Viable, Callus, Subcutaneous, Skin: Dermis , Skin: Epidermis Level: Skin/Subcutaneous Tissue Debridement Description: Excisional Instrument: Curette Bleeding: Minimum End Time: 10:15 Procedural Pain: 0 Post Procedural Pain: 0 Response to Treatment: Procedure  was tolerated well Level of Consciousness (Post- Awake and Alert procedure): Post Debridement Measurements of Total Wound Length: (cm) 0.5 Width: (cm) 0.3 Depth: (cm) 0.2 Volume: (cm) 0.024 Character of Wound/Ulcer Post Debridement: Improved Post Procedure Diagnosis Same as Pre-procedure [name], [name] ([id number]) [id number] Page 2 of 10 Electronic Signature([name]) Signed: 02/11/2022 10:52:56 AM By: Kalman Shan DO Signed: 02/11/2022 4:14:29 PM By: Deon Pilling RN, BSN Entered By: Deon Pilling on 02/11/2022 10:16:00 -------------------------------------------------------------------------------- Debridement Details Patient Name: Date of Service: [name]. 02/11/2022 9:00 A M Medical Record Number: [id number] Patient Account Number: [id number] Date of Birth/Sex: Treating RN: 08-16-1965 (57 y.o. [name] Primary Care Provider: [name] Other Clinician: Referring Provider: Treating Provider/Extender: [name] in Treatment: 0 Debridement Performed for Assessment: Wound #1 Left T Great oe Performed By: Clinician Deon Pilling, RN Debridement Type: Chemical/Enzymatic/Mechanical Agent Used: gauze and wound cleanser Level of Consciousness (Pre-procedure): Awake and Alert Pre-procedure Verification/Time Out No Taken: Bleeding: None Response to Treatment: Procedure was tolerated well Level of Consciousness (Post- Awake and Alert procedure): Post Debridement Measurements of Total Wound Length: (cm) 1.1 Width: (cm) 1.4 Depth: (cm) 0.1 Volume: (cm) 0.121 Character of Wound/Ulcer Post Debridement: Stable Post Procedure Diagnosis Same as Pre-procedure Electronic Signature([name]) Signed: 02/11/2022 10:52:56 AM By: Kalman Shan DO Signed: 02/11/2022 4:14:29 PM By: Deon Pilling RN, BSN Entered By: Deon Pilling on 02/11/2022  10:20:23 -------------------------------------------------------------------------------- HPI Details Patient Name: Date of Service: [name]. 02/11/2022 9:00 A M Medical Record Number: [id number] Patient Account Number: [id number] Date of Birth/Sex: Treating RN: 11-11-1965 (57 y.o. M) Primary Care Provider: [name] Other Clinician: Referring Provider: Treating Provider/Extender: [name] in Treatment: 0 History of Present Illness HPI Description: 02/11/2022 [name] is a 57 year old male with a past medical history of current tobacco user, OSA, venous insufficiency and peripheral neuropathy that presents the clinic for a 2-week history of ulcer to the left great toe and 2-65-monthhistory of nonhealing ulcer to the left plantar foot. [place] works in a [place] is  on his feet all day. He wears steel toed boots. He is currently out of work due to his wounds. On 11/26/2021 he was admitted to the hospital for cellulitis and abscess to the left foot. 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 He states he has been on doxycycline for [place], [place] ([id number]) [id number] Page 3 of 10 weeks. He just completed this course. He uses regular tennis shoes for walking. He is not offloading the area. He is using Betadine for wound dressings. He has been following with Dr. Posey Pronto, podiatry who Referred to our clinic. Electronic Signature([name]) Signed: 02/11/2022 10:52:56 AM By: Kalman Shan DO Entered By: Kalman Shan on 02/11/2022 10:28:35 -------------------------------------------------------------------------------- Physical Exam Details Patient Name: Date of Service: [name]. 02/11/2022 9:00 A M Medical Record Number: [id number] Patient Account Number: [id number] Date of Birth/Sex: Treating RN: [birth year] (57 y.o. M) Primary Care Provider: [name] Other Clinician: Referring Provider: Treating Provider/Extender: [name]: 0 Constitutional respirations regular, non-labored and within target range for patient.. Cardiovascular 2+ dorsalis pedis/posterior tibialis pulses. Psychiatric pleasant and cooperative. Notes Left foot: T the plantar aspect of the great toe there is an open wound with granulation tissue and nonviable tissue. T the plantar aspect of the foot there is o o an open wound with nonviable tissue and granulation tissue. No signs of surrounding soft tissue infection to any of the wound beds. Electronic Signature([name]) Signed: 02/19/2022 9:26:44 AM By: Kalman Shan DO Previous Signature: 02/11/2022 10:52:56 AM Version By: Kalman Shan DO Entered By: Kalman Shan on 02/18/2022 13:43:45 -------------------------------------------------------------------------------- Physician Orders Details Patient Name: Date of Service: [name]. 02/11/2022 9:00 A M Medical Record Number: [id number] Patient Account Number: [id number] Date of Birth/Sex: Treating RN: [birth year] (57 y.o. [name] Primary Care Provider: [name] Other Clinician: Referring Provider: Treating Provider/Extender: [name] in Treatment: 0 Verbal / Phone Orders: No Diagnosis Coding ICD-10 Coding Code Description [phone] Non-pressure chronic ulcer of other part of left foot with other specified severity G90.09 Other idiopathic peripheral autonomic neuropathy Follow-up Appointments ppointment in 1 week. - Dr. Heber [place] and Tammi Klippel 1230 11/19/2022 Return A Anesthetic (In clinic) Topical Lidocaine 4% applied to wound bed Toa, Mia Aison [name] ([id number]) [id number] Page 4 of 10 Bathing/ Shower/ Hygiene May shower with protection but do not get wound dressing([name]) wet. Protect dressing([name]) with water repellant cover (for  example, large plastic bag) or a cast cover and may then take shower. Edema Control - Lymphedema / SCD / Other Elevate legs to the level of the heart or above for 30 minutes daily and/or when sitting for 3-4 times a day throughout the day. Avoid standing for long periods of time. Off-Loading Open toe surgical shoe to: - with offloading Peg Assist to aid in offloading pressure to left plantar foot. Wound Treatment Wound #1 - T Great oe Wound Laterality: Left Cleanser: Wound Cleanser 1 x Per Day/30 Days Discharge Instructions: Cleanse the wound with wound cleanser prior to applying a clean dressing using gauze sponges, not tissue or cotton balls. Cleanser: Byram Ancillary Kit - 15 Day Supply (DME) (Generic) 1 x Per Day/30 Days Discharge Instructions: Use supplies as instructed; Kit contains: (15) Saline Bullets; (15) 3x3 Gauze; 15 pr Gloves Prim Dressing: Hydrofera Blue Ready Transfer Foam, 2.5x2.5 (in/in) (DME) (Generic) 1 x Per Day/30 Days ary Discharge Instructions: Apply directly to wound bed as directed Prim Dressing: MediHoney Gel, tube 1.5 (oz) 1 x Per Day/30 Days  ary Discharge Instructions: Apply to wound bed as instructed Secondary Dressing: Optifoam Non-Adhesive Dressing, 4x4 in (DME) (Generic) 1 x Per Day/30 Days Discharge Instructions: Apply x1 layer donut to aid in offloading. Secondary Dressing: Woven Gauze Sponges 2x2 in (DME) (Generic) 1 x Per Day/30 Days Discharge Instructions: Apply over primary dressing as directed. Secured With: Child psychotherapist, Sterile 2x75 (in/in) (DME) (Generic) 1 x Per Day/30 Days Discharge Instructions: Secure with stretch gauze as directed. Secured With: 98M Medipore H Soft Cloth Surgical T ape, 4 x 10 (in/yd) (DME) (Generic) 1 x Per Day/30 Days Discharge Instructions: Secure with tape as directed. Wound #2 - Foot Wound Laterality: Plantar, Left Cleanser: Wound Cleanser 1 x Per Day/30 Days Discharge Instructions: Cleanse the wound  with wound cleanser prior to applying a clean dressing using gauze sponges, not tissue or cotton balls. Cleanser: Byram Ancillary Kit - 15 Day Supply (DME) (Generic) 1 x Per Day/30 Days Discharge Instructions: Use supplies as instructed; Kit contains: (15) Saline Bullets; (15) 3x3 Gauze; 15 pr Gloves Prim Dressing: Hydrofera Blue Ready Transfer Foam, 2.5x2.5 (in/in) (DME) (Generic) 1 x Per Day/30 Days ary Discharge Instructions: Apply directly to wound bed as directed Prim Dressing: MediHoney Gel, tube 1.5 (oz) 1 x Per Day/30 Days ary Discharge Instructions: Apply to wound bed as instructed Secondary Dressing: Optifoam Non-Adhesive Dressing, 4x4 in (DME) (Generic) 1 x Per Day/30 Days Discharge Instructions: Apply x1 layer donut to aid in offloading. Secondary Dressing: Woven Gauze Sponges 2x2 in (DME) (Generic) 1 x Per Day/30 Days Discharge Instructions: Apply over primary dressing as directed. Secured With: Child psychotherapist, Sterile 2x75 (in/in) (DME) (Generic) 1 x Per Day/30 Days Discharge Instructions: Secure with stretch gauze as directed. Secured With: 98M Medipore H Soft Cloth Surgical T ape, 4 x 10 (in/yd) (DME) (Generic) 1 x Per Day/30 Days Discharge Instructions: Secure with tape as directed. Patient Medications llergies: No Known Drug Allergies A Notifications Medication Indication Start End for debridements in 02/11/2022 lidocaine office. DOSE topical 4 % cream - cream topical once daily Electronic Signature([name]) Signed: 02/11/2022 10:52:56 AM By: Marga Melnick, Jayvian [name] ([id number]) [id number] Page 5 of 10 Entered By: Kalman Shan on 02/11/2022 10:29:24 -------------------------------------------------------------------------------- Problem List Details Patient Name: Date of Service: [name], [name] 02/11/2022 9:00 A M Medical Record Number: [id number] Patient Account Number: [id number] Date of Birth/Sex:  Treating RN: 05/20/1965 (57 y.o. M) Primary Care Provider: [name] Other Clinician: Referring Provider: Treating Provider/Extender: [name] in Treatment: 0 Active Problems ICD-10 Encounter Code Description Active Date MDM Diagnosis [phone] Non-pressure chronic ulcer of other part of 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 02/11/2022 No Yes G90.09 Other idiopathic peripheral autonomic neuropathy 02/11/2022 No Yes G47.33 Obstructive sleep apnea (adult) (pediatric) 02/11/2022 No Yes I87.2 Venous insufficiency (chronic) (peripheral) 02/11/2022 No Yes Inactive Problems Resolved Problems Electronic Signature([name]) Signed: 02/11/2022 10:52:56 AM By: Kalman Shan DO Entered By: Kalman Shan on 02/11/2022 10:34:42 -------------------------------------------------------------------------------- Progress Note Details Patient Name: Date of Service: [name]. 02/11/2022 9:00 A M Medical Record Number: [id number] Patient Account Number: [id number] Date of Birth/Sex: Treating RN: 1965-09-10 (57 y.o. M) Primary Care Provider: [name] Other Clinician: Referring Provider: Treating Provider/Extender: [name] in Treatment: 0 Subjective Chief Complaint Information obtained from Patient 02/11/2022 left great toe wound and fourth sub-met head wound History of Present Illness (HPI) 02/11/2022 [name], [name] ([id number]) [id number] Page 6 of 10 Mr. Georgio Hattabaugh is a 57 year old male with a past medical  history of current tobacco user, OSA, venous insufficiency and peripheral neuropathy that presents the clinic for a 2-week history of ulcer to the left great toe and 2-65-monthhistory of nonhealing ulcer to the left plantar foot. [place] works in a [place] feet all day. He wears steel toed boots. He is currently out of work due to his wounds. On 11/26/2021 he was  admitted to the hospital for cellulitis and abscess to the left foot. 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 He states he has been on doxycycline for 10 weeks. He just completed this course. He uses regular tennis shoes for walking. He is not offloading the area. He is using Betadine for wound dressings. He has been following with Dr. PPosey Pronto podiatry who Referred to our clinic. Patient History Allergies No Known Drug Allergies Family History Heart Disease - Mother, Lung Disease - Mother. Social History Current every day smoker - 10 cig a day, Alcohol Use - Never, Drug Use - No History, Caffeine Use - Never. Medical History Hematologic/Lymphatic Patient has history of Lymphedema - pumps use daily Respiratory Patient has history of Sleep Apnea - CPAP Cardiovascular Patient has history of Hypertension, Peripheral Venous Disease - needs ablation on left leg Musculoskeletal Patient has history of Osteoarthritis Denies history of Osteomyelitis Neurologic Patient has history of Neuropathy Hospitalization/Surgery History - 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. Medical A Surgical History Notes nd Musculoskeletal abscess to left foot 3rd toe 11/28/2022 Review of Systems (ROS) Eyes Denies complaints or symptoms of Dry Eyes, Vision Changes, Glasses / Contacts. Ear/Nose/Mouth/Throat Denies complaints or symptoms of Chronic sinus problems or rhinitis. Respiratory Denies complaints or symptoms of Chronic or frequent coughs, Shortness of Breath. Gastrointestinal Denies complaints or symptoms of Frequent diarrhea, Nausea, Vomiting. Endocrine Denies complaints or symptoms of Heat/cold intolerance. Genitourinary Denies complaints or symptoms of Frequent urination. Integumentary (Skin) Complains or has symptoms of Wounds - left foot. Musculoskeletal Denies complaints or symptoms of Muscle Pain, Muscle Weakness. Neurologic Denies complaints or symptoms of  Numbness/parasthesias. Psychiatric Denies complaints or symptoms of Claustrophobia. Objective Constitutional respirations regular, non-labored and within target range for patient.. Vitals Time Taken: 9:32 AM, Height: 75 in, Source: Stated, Weight: 220 lbs, Source: Stated, BMI: 27.5, Temperature: 98.6 F, Pulse: 78 bpm, Respiratory Rate: 20 breaths/min, Blood Pressure: 160/93 mmHg. Cardiovascular 2+ dorsalis pedis/posterior tibialis pulses. Psychiatric pleasant and cooperative. General Notes: Left foot: T the plantar aspect of the great toe there is an open wound with granulation tissue and nonviable tissue. T the plantar aspect of the o o foot there is an open wound with nonviable tissue and granulation tissue. No signs of surrounding soft tissue infection to any of the wound beds. Integumentary (Hair, Skin) Wound #1 status is Open. Original cause of wound was Shear/Friction. The date acquired was: 01/28/2022. The wound is located on the Left T Great. The wound oe measures 1.1cm length x 1.4cm width x 0.1cm depth; 1.21cm^2 area and 0.121cm^3 volume. There is Fat Layer (Subcutaneous Tissue) exposed. There is no tunneling or undermining noted. There is a medium amount of serosanguineous drainage noted. The wound margin is distinct with the outline attached to the wound base. There is medium (34-66%) red, pink granulation within the wound bed. There is a medium (34-66%) amount of necrotic tissue within the wound bed [name], [name] ([id number] Page 7 of 10 including Adherent Slough. The periwound skin appearance exhibited: Callus, Dry/Scaly. The periwound skin appearance did not exhibit: Crepitus,  Excoriation, Induration, Rash, Scarring, Maceration, Atrophie Blanche, Cyanosis, Ecchymosis, Hemosiderin Staining, Mottled, Pallor, Rubor, Erythema. Wound #2 status is Open. Original cause of wound was Surgical Injury. The date acquired was: 01/14/2022. The  wound is located on the [place]. The wound measures 0.5cm length x 0.3cm width x 0.2cm depth; 0.118cm^2 area and 0.024cm^3 volume. There is Fat Layer (Subcutaneous Tissue) exposed. There is no tunneling noted, however, there is undermining starting at 2:00 and ending at 3:00 with a maximum distance of 0.2cm. There is a medium amount of serosanguineous drainage noted. The wound margin is distinct with the outline attached to the wound base. There is medium (34-66%) red, pink granulation within the wound bed. There is a medium (34-66%) amount of necrotic tissue within the wound bed including Adherent Slough. The periwound skin appearance exhibited: Callus. The periwound skin appearance did not exhibit: Crepitus, Excoriation, Induration, Rash, Scarring, Dry/Scaly, Maceration, Atrophie Blanche, Cyanosis, Ecchymosis, Hemosiderin Staining, Mottled, Pallor, Rubor, Erythema. 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 I debrided nonviable tissue. No signs of acute infection. I recommended aggressively offloading the wound areas to help with wound healing. We gave him a peg assist and a surgical shoe to help with this. For the dressings I recommended Hydrofera Blue and Medihoney. We discussed potentially doing a total contact cast in the near future. He was agreeable to this. Follow-up in 1 week. 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 There was a Chemical/Enzymatic/Mechanical oe debridement performed by DDeon Pilling  RN.. Other agent used was gauze and wound cleanser. There was no bleeding. The procedure was tolerated well. Post Debridement Measurements: 1.1cm length x 1.4cm width x 0.1cm depth; 0.121cm^3 volume. Character of Wound/Ulcer Post Debridement is stable. Post procedure Diagnosis Wound #1: Same as Pre-Procedure Wound #2 Pre-procedure diagnosis of Wound #2 is an Open Surgical Wound located on the [place]. There was a Excisional Skin/Subcutaneous Tissue Debridement with a total area of 0.5 sq cm performed by HKalman Shan DO. With the following instrument([name]): Curette to remove Viable and Non-Viable tissue/material. Material removed includes Callus, Subcutaneous Tissue, Skin: Dermis, and Skin: Epidermis after achieving pain control using Lidocaine 4% T opical Solution. A time out was conducted at 10:10, prior to the start of the procedure. A Minimum amount of bleeding was controlled with N/A. The procedure was tolerated well with a pain level of 0 throughout and a pain level of 0 following the procedure. Post Debridement Measurements: 0.5cm length x 0.3cm width x 0.2cm depth; 0.024cm^3 volume. Character of Wound/Ulcer Post Debridement is improved. Post procedure Diagnosis Wound #2: Same as Pre-Procedure Plan Follow-up Appointments: Return Appointment in 1 week. - Dr. [id number]/19/2022 Anesthetic: (In clinic) Topical Lidocaine 4% applied to wound bed Bathing/ Shower/ Hygiene: May shower with protection but do not get wound dressing([name]) wet. Protect dressing([name]) with water repellant cover (for example, large plastic bag) or a cast cover and may then take shower. Edema Control - Lymphedema / SCD / Other: Elevate legs to the level of the heart or above for 30 minutes daily and/or when sitting for 3-4 times a  day throughout the day. Avoid standing for long periods of time. Off-Loading: Open toe surgical shoe to: - with offloading Peg Assist to aid in offloading pressure to  left plantar foot. The following medication([name]) was prescribed: lidocaine topical 4 % cream cream topical once daily for for debridements in office. was prescribed at facility WOUND #1: - T Great Wound Laterality: Left oe Cleanser: Wound Cleanser 1 x Per Day/30 Days Discharge Instructions: Cleanse the wound with wound cleanser prior to applying a clean dressing using gauze sponges, not tissue or cotton balls. Cleanser: Byram Ancillary Kit - 15 Day Supply (DME) (Generic) 1 x Per Day/30 Days Discharge Instructions: Use supplies as instructed; Kit contains: (15) Saline Bullets; (15) 3x3 Gauze; 15 pr Gloves Prim Dressing: Hydrofera Blue Ready Transfer Foam, 2.5x2.5 (in/in) (DME) (Generic) 1 x Per Day/30 Days ary Discharge Instructions: Apply directly to wound bed as directed Prim Dressing: MediHoney Gel, tube 1.5 (oz) 1 x Per Day/30 Days ary Discharge Instructions: Apply to wound bed as instructed Secondary Dressing: Optifoam Non-Adhesive Dressing, 4x4 in (DME) (Generic) 1 x Per Day/30 Days Discharge Instructions: Apply x1 layer donut to aid in offloading. Secondary Dressing: Woven Gauze Sponges 2x2 in (DME) (Generic) 1 x Per Day/30 Days [name], [name] ([id number]) [id number] Page 8 of 10 Discharge Instructions: Apply over primary dressing as directed. Secured With: Child psychotherapist, Sterile 2x75 (in/in) (DME) (Generic) 1 x Per Day/30 Days Discharge Instructions: Secure with stretch gauze as directed. Secured With: 30M Medipore H Soft Cloth Surgical T ape, 4 x 10 (in/yd) (DME) (Generic) 1 x Per Day/30 Days Discharge Instructions: Secure with tape as directed. WOUND #2: - Foot Wound Laterality: Plantar, Left Cleanser: Wound Cleanser 1 x Per Day/30 Days Discharge Instructions: Cleanse the wound with wound cleanser prior to applying a clean dressing using gauze sponges, not tissue or cotton balls. Cleanser: Byram Ancillary Kit - 15 Day Supply (DME)  (Generic) 1 x Per Day/30 Days Discharge Instructions: Use supplies as instructed; Kit contains: (15) Saline Bullets; (15) 3x3 Gauze; 15 pr Gloves Prim Dressing: Hydrofera Blue Ready Transfer Foam, 2.5x2.5 (in/in) (DME) (Generic) 1 x Per Day/30 Days ary Discharge Instructions: Apply directly to wound bed as directed Prim Dressing: MediHoney Gel, tube 1.5 (oz) 1 x Per Day/30 Days ary Discharge Instructions: Apply to wound bed as instructed Secondary Dressing: Optifoam Non-Adhesive Dressing, 4x4 in (DME) (Generic) 1 x Per Day/30 Days Discharge Instructions: Apply x1 layer donut to aid in offloading. Secondary Dressing: Woven Gauze Sponges 2x2 in (DME) (Generic) 1 x Per Day/30 Days Discharge Instructions: Apply over primary dressing as directed. Secured With: Child psychotherapist, Sterile 2x75 (in/in) (DME) (Generic) 1 x Per Day/30 Days Discharge Instructions: Secure with stretch gauze as directed. Secured With: 30M Medipore H Soft Cloth Surgical T ape, 4 x 10 (in/yd) (DME) (Generic) 1 x Per Day/30 Days Discharge Instructions: Secure with tape as directed. 1. Aggressive offloading oo Peg assist with surgical shoe 2. Hydrofera Blue and Medihoney 3. Follow-up in 1 week Electronic Signature([name]) Signed: 02/19/2022 9:26:44 AM By: Kalman Shan DO Previous Signature: 02/11/2022 10:52:56 AM Version By: Kalman Shan DO Entered By: Kalman Shan on 02/18/2022 13:44:30 -------------------------------------------------------------------------------- HxROS Details Patient Name: Date of Service: [name]. 02/11/2022 9:00 A M Medical Record Number: [id number] Patient Account Number: [id number] Date of Birth/Sex: Treating RN: 1965-09-03 (57 y.o. [name] Primary Care Provider: [name] Other Clinician: Referring Provider: Treating Provider/Extender: [name]: 0 Eyes Complaints and Symptoms: Negative  for: Dry  Eyes; Vision Changes; Glasses / Contacts Ear/Nose/Mouth/Throat Complaints and Symptoms: Negative for: Chronic sinus problems or rhinitis Respiratory Complaints and Symptoms: Negative for: Chronic or frequent coughs; Shortness of Breath Medical History: Positive for: Sleep Apnea - CPAP Gastrointestinal Complaints and Symptoms: Negative for: Frequent diarrhea; Nausea; Vomiting Endocrine Complaints and Symptoms: Negative for: Heat/cold intolerance [name], [name] ([id number]) [id number] Page 9 of 10 Genitourinary Complaints and Symptoms: Negative for: Frequent urination Integumentary (Skin) Complaints and Symptoms: Positive for: Wounds - left foot Musculoskeletal Complaints and Symptoms: Negative for: Muscle Pain; Muscle Weakness Medical History: Positive for: Osteoarthritis Negative for: Osteomyelitis Past Medical History Notes: abscess to left foot 3rd toe 11/28/2022 Neurologic Complaints and Symptoms: Negative for: Numbness/parasthesias Medical History: Positive for: Neuropathy Psychiatric Complaints and Symptoms: Negative for: Claustrophobia Hematologic/Lymphatic Medical History: Positive for: Lymphedema - pumps use daily Cardiovascular Medical History: Positive for: Hypertension; Peripheral Venous Disease - needs ablation on left leg Immunological Oncologic Immunizations Implantable Devices No devices added Hospitalization / Surgery History Type of Hospitalization/Surgery 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 Family and Social History Heart Disease: Yes - Mother; Lung Disease: Yes - Mother; Current every day smoker - 10 cig a day; Alcohol Use: Never; Drug Use: No History; Caffeine Use: Never; Financial Concerns: No; Food, Clothing or Shelter Needs: No; Support System Lacking: No; Transportation Concerns: No Electronic Signature([name]) Signed: 02/11/2022 10:52:56 AM By: Kalman Shan DO Signed: 02/11/2022 4:14:29 PM By:  Deon Pilling RN, BSN Entered By: Deon Pilling on 02/11/2022 09:39:51 SuperBill Details -------------------------------------------------------------------------------- [name] ([id number]) [id number] Page 10 of 10 Patient Name: Date of Service: [name], [name] 02/11/2022 Medical Record Number: [id number] Patient Account Number: [id number] Date of Birth/Sex: Treating RN: August 29, 1965 (57 y.o. 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 G47.33 Obstructive sleep apnea (adult) (pediatric) I87.2 Venous insufficiency (chronic) (peripheral) Facility Procedures CPT4 Code Description Modifier Quantity [id number] 99213 - WOUND CARE VISIT-LEV 3 EST PT 1 [id number] 11042 - DEB SUBQ TISSUE 20 SQ CM/< 1 ICD-10 Diagnosis Description L97.522 Non-pressure chronic ulcer of other part of 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 [id number] 97602 - DEBRIDE W/O ANES NON SELECT 59 1 Physician Procedures Quantity CPT4 Code Description Modifier [id number] 29021 - WC PHYS LEVEL 4 - NEW PT 1 ICD-10 Diagnosis Description L97.522 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

## 2022-02-11 NOTE — Progress Notes (Signed)
[name], John S ([id number]) [id number] Page 1 of 4 Visit Report for 02/11/2022 Abuse Risk Screen Details Patient Name: Date of Service: [name], [name] 02/11/2022 9:00 A M Medical Record Number: [id number] Patient Account Number: [id number] Date of Birth/Sex: Treating RN: 01/03/1966 (57 y.o. [name] Primary Care Genasis Zingale: [name] Other Clinician: Referring Izear Pine: Treating Coral Soler/Extender: Seward Grater in Treatment: 0 Abuse Risk Screen Items Answer ABUSE RISK SCREEN: Has anyone close to you tried to hurt or harm you recentlyo No Do you feel uncomfortable with anyone in your familyo No Has anyone forced you do things that you didnt want to doo No Electronic Signature(s) Signed: 02/11/2022 4:14:29 PM By: Deon Pilling RN, BSN Entered By: Deon Pilling on 02/11/2022 09:39:56 -------------------------------------------------------------------------------- Activities of Daily Living Details Patient Name: Date of Service: John, [name] 02/11/2022 9:00 [place] Record Number: [id number] Patient Account Number: [id number] Date of Birth/Sex: Treating RN: 05-11-1965 (57 y.o. [name] Primary Care Taneka Espiritu: [name] Other Clinician: Referring Nalini Alcaraz: Treating Alverna Fawley/Extender: Seward Grater in Treatment: 0 Activities of Daily Living Items Answer Activities of Daily Living (Please select one for each item) Drive Automobile Completely Able T Medications ake Completely Able Use T elephone Completely Able Care for Appearance Completely Able Use T oilet Completely Able Bath / Shower Completely Able Dress Self Completely Able Feed Self Completely Able Walk Completely Able Get In / Out Bed Completely Able Housework Completely Able Prepare Meals Completely Ferdinand for Self Completely Able Electronic Signature(s) Signed:  02/11/2022 4:14:29 PM By: Deon Pilling RN, BSN Entered By: Deon Pilling on 02/11/2022 09:40:11 Diekmann, Antionette Char ([id number]) [id number] Page 2 of 4 -------------------------------------------------------------------------------- Education Screening Details Patient Name: Date of Service: John, [name] 02/11/2022 9:00 A M Medical Record Number: [id number] Patient Account Number: [id number] Date of Birth/Sex: Treating RN: [birth year] (57 y.o. [name] Primary Care Samarie Pinder: [name] Other Clinician: Referring Carney Saxton: Treating Sherryll Skoczylas/Extender: Seward Grater in Treatment: 0 Primary Learner Assessed: Patient Learning Preferences/Education Level/Primary Language Learning Preference: Explanation, Demonstration, Printed Material Highest Education Level: College or Above Preferred Language: English Cognitive Barrier Language Barrier: No Translator Needed: No Memory Deficit: No Emotional Barrier: No Cultural/Religious Beliefs Affecting Medical Care: No Physical Barrier Impaired Vision: Yes Glasses Impaired Hearing: No Decreased Hand dexterity: No Knowledge/Comprehension Knowledge Level: High Comprehension Level: High Ability to understand written instructions: High Ability to understand verbal instructions: High Motivation Anxiety Level: Calm Cooperation: Cooperative Education Importance: Acknowledges Need Interest in Health Problems: Asks Questions Perception: Coherent Willingness to Engage in Self-Management High Activities: Readiness to Engage in Self-Management High Activities: Electronic Signature(s) Signed: 02/11/2022 4:14:29 PM By: Deon Pilling RN, BSN Entered By: Deon Pilling on 02/11/2022 09:40:50 -------------------------------------------------------------------------------- Fall Risk Assessment Details Patient Name: Date of Service: John Mae S. 02/11/2022 9:00 [place]  Record Number: [id number] Patient Account Number: [id number] Date of Birth/Sex: Treating RN: 21-Jul-1965 (56 y.o. [name] Primary Care Jacquiline Zurcher: [name] Other Clinician: Referring Ulrick Methot: Treating Ebrahim Deremer/Extender: Seward Grater in Treatment: 0 Fall Risk Assessment Items Have you had 2 or more falls in the last 12 monthso 0 No Barse, Darald S ([id number]) [phone] [id number] Page 3 of 4 Have you had any fall that resulted in injury in the last 12 monthso 0 No FALLS RISK SCREEN History of falling - immediate or within 3 months 0 No Secondary diagnosis (Do you have 2 or more medical diagnoseso) 0 No Ambulatory  aid None/bed rest/wheelchair/nurse 0 Yes Crutches/cane/walker 0 No Furniture 0 No Intravenous therapy Access/Saline/Heparin Lock 0 No Gait/Transferring Normal/ bed rest/ wheelchair 0 Yes Weak (short steps with or without shuffle, stooped but able to lift head while walking, may seek 0 No support from furniture) Impaired (short steps with shuffle, may have difficulty arising from chair, head down, impaired 0 No balance) Mental Status Oriented to own ability 0 Yes Electronic Signature(s) Signed: 02/11/2022 4:14:29 PM By: Deon Pilling RN, BSN Entered By: Deon Pilling on 02/11/2022 09:40:57 -------------------------------------------------------------------------------- Foot Assessment Details Patient Name: Date of Service: [name]. 02/11/2022 9:00 [place] Record Number: [id number] Patient Account Number: [id number] Date of Birth/Sex: Treating RN: 1965/02/01 (57 y.o. [name] Primary Care Leanne Sisler: [name] Other Clinician: Referring Zaeda Mcferran: Treating Reshanda Lewey/Extender: Seward Grater in Treatment: 0 Foot Assessment Items Site Locations + = Sensation present, - = Sensation absent, C = Callus, U = Ulcer R = Redness, W = Warmth, M = Maceration, PU =  Pre-ulcerative lesion F = Fissure, S = Swelling, D = Dryness Assessment Right: Left: Other Deformity: No No Prior Foot Ulcer: No No Prior Amputation: No Yes Charcot Joint: No No Ambulatory Status: Ambulatory Without Help GaitKARION, CUDD S ([id number]) [phone] [id number] Page 4 of 4 Electronic Signature(s) Signed: 02/11/2022 4:14:29 PM By: Deon Pilling RN, BSN Entered By: Deon Pilling on 02/11/2022 09:45:41 -------------------------------------------------------------------------------- Nutrition Risk Screening Details Patient Name: Date of Service: TEONDRE, JAROSZ 02/11/2022 9:00 A M Medical Record Number: [id number] Patient Account Number: [id number] Date of Birth/Sex: Treating RN: 1965/12/28 (57 y.o. [name] Primary Care Karolee Meloni: [name] Other Clinician: Referring Zylah Elsbernd: Treating Othell Diluzio/Extender: Seward Grater in Treatment: 0 Height (in): Weight (lbs): Body Mass Index (BMI): Nutrition Risk Screening Items Score Screening NUTRITION RISK SCREEN: I have an illness or condition that made me change the kind and/or amount of food I eat 2 Yes I eat fewer than two meals per day 0 No I eat few fruits and vegetables, or milk products 0 No I have three or more drinks of beer, liquor or wine almost every day 0 No I have tooth or mouth problems that make it hard for me to eat 0 No I don't always have enough money to buy the food I need 0 No I eat alone most of the time 0 No I take three or more different prescribed or over-the-counter drugs a day 1 Yes Without wanting to, I have lost or gained 10 pounds in the last six months 0 No I am not always physically able to shop, cook and/or feed myself 0 No Nutrition Protocols Good Risk Protocol Moderate Risk Protocol 0 Provide education on nutrition High Risk Proctocol Risk Level: Moderate Risk Score: 3 Electronic Signature(s) Signed: 02/11/2022  4:14:29 PM By: Deon Pilling RN, BSN Entered By: Deon Pilling on 02/11/2022 09:41:12

## 2022-02-18 ENCOUNTER — Encounter (HOSPITAL_BASED_OUTPATIENT_CLINIC_OR_DEPARTMENT_OTHER): Payer: BC Managed Care – PPO | Admitting: Internal Medicine

## 2022-02-18 DIAGNOSIS — L97522 Non-pressure chronic ulcer of other part of 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: Secondary | ICD-10-CM

## 2022-02-18 DIAGNOSIS — G9009 Other idiopathic peripheral autonomic neuropathy: Secondary | ICD-10-CM | POA: Diagnosis not present

## 2022-02-18 DIAGNOSIS — F17200 Nicotine dependence, unspecified, uncomplicated: Secondary | ICD-10-CM | POA: Diagnosis not present

## 2022-02-18 DIAGNOSIS — L02612 Cutaneous abscess of left foot: Secondary | ICD-10-CM | POA: Diagnosis not present

## 2022-02-18 DIAGNOSIS — G4733 Obstructive sleep apnea (adult) (pediatric): Secondary | ICD-10-CM | POA: Diagnosis not present

## 2022-02-18 DIAGNOSIS — I10 Essential (primary) hypertension: Secondary | ICD-10-CM | POA: Diagnosis not present

## 2022-02-18 DIAGNOSIS — I872 Venous insufficiency (chronic) (peripheral): Secondary | ICD-10-CM | POA: Diagnosis not present

## 2022-02-18 DIAGNOSIS — L97528 Non-pressure chronic ulcer of other part of left foot with other specified severity: Secondary | ICD-10-CM | POA: Diagnosis not present

## 2022-02-18 DIAGNOSIS — G629 Polyneuropathy, unspecified: Secondary | ICD-10-CM | POA: Diagnosis not present

## 2022-02-19 DIAGNOSIS — F1120 Opioid dependence, uncomplicated: Secondary | ICD-10-CM | POA: Diagnosis not present

## 2022-02-19 NOTE — Progress Notes (Signed)
EBENEZER, MCCASKEY [name] ([id number]) [id number] Page 1 of 11 Visit Report for 02/18/2022 Arrival Information Details Patient Name: Date of Service: [name], [name] 02/18/2022 12:30 PM Medical Record Number: [id number] Patient Account Number: [id number] Date of Birth/Sex: Treating RN: December 31, 1965 (57 y.o. M) Primary Care Donnamaria Shands: [name] Other Clinician: Referring Jermey Closs: Treating Toniya Rozar/Extender: [name] in Treatment: 1 Visit Information History Since Last Visit Added or deleted any medications: No Patient Arrived: Ambulatory Any new allergies or adverse reactions: No Arrival Time: 12:42 Had a fall or experienced change in No Accompanied By: self activities of daily living that may affect Transfer Assistance: None risk of falls: Patient Identification Verified: Yes Signs or symptoms of abuse/neglect since last visito No Secondary Verification Process Completed: Yes Hospitalized since last visit: No Patient Requires Transmission-Based No Implantable device outside of the clinic excluding No Precautions: cellular tissue based products placed in the center Patient Has Alerts: Yes since last visit: Patient Alerts: 11/27/21 ABI L 1.26 R1.09 Has Compression in Place as Prescribed: Yes 11/27/2021 TBI L0.72 Pain Present Now: No R0.66 Electronic Signature([name]) Signed: 02/18/2022 4:56:53 PM By: Erenest Blank Entered By: Erenest Blank on 02/18/2022 12:44:56 -------------------------------------------------------------------------------- Clinic Level of Care Assessment Details Patient Name: Date of Service: [name], [name] 02/18/2022 12:30 PM Medical Record Number: [id number] Patient Account Number: [id number] Date of Birth/Sex: Treating RN: Jun 09, 1965 (57 y.o. [name], [name] Primary Care Liller Yohn: [name] Other Clinician: Referring Jeramiah Mccaughey: Treating Julicia Krieger/Extender: [name] in Treatment: 1 Clinic Level of Care Assessment Items TOOL 4 Quantity Score X- 1 0 Use when only an EandM is performed on FOLLOW-UP visit ASSESSMENTS - Nursing Assessment / Reassessment X- 1 10 Reassessment of Co-morbidities (includes updates in patient status) X- 1 5 Reassessment of Adherence to Treatment Plan ASSESSMENTS - Wound and Skin A ssessment / Reassessment X - Simple Wound Assessment / Reassessment - one wound 1 5 '[]'$  - 0 Complex Wound Assessment / Reassessment - multiple wounds '[]'$  - 0 Dermatologic / Skin Assessment (not related to wound area) ASSESSMENTS - Focused Assessment X- 1 5 Circumferential Edema Measurements - multi extremities '[]'$  - 0 Nutritional Assessment / Counseling / Intervention KHYRIE, MASI [name] ([id number]) [id number] Page 2 of 11 '[]'$  - 0 Lower Extremity Assessment (monofilament, tuning fork, pulses) '[]'$  - 0 Peripheral Arterial Disease Assessment (using hand held doppler) ASSESSMENTS - Ostomy and/or Continence Assessment and Care '[]'$  - 0 Incontinence Assessment and Management '[]'$  - 0 Ostomy Care Assessment and Management (repouching, etc.) PROCESS - Coordination of Care X - Simple Patient / Family Education for ongoing care 1 15 '[]'$  - 0 Complex (extensive) Patient / Family Education for ongoing care X- 1 10 Staff obtains Programmer, systems, Records, T Results / Process Orders est '[]'$  - 0 Staff telephones HHA, Nursing Homes / Clarify orders / etc '[]'$  - 0 Routine Transfer to another Facility (non-emergent condition) '[]'$  - 0 Routine Hospital Admission (non-emergent condition) '[]'$  - 0 New Admissions / Biomedical engineer / Ordering NPWT Apligraf, etc. , '[]'$  - 0 Emergency Hospital Admission (emergent condition) X- 1 10 Simple Discharge Coordination '[]'$  - 0 Complex (extensive) Discharge Coordination PROCESS - Special Needs '[]'$  - 0 Pediatric / Minor Patient Management '[]'$  - 0 Isolation Patient  Management '[]'$  - 0 Hearing / Language / Visual special needs '[]'$  - 0 Assessment of Community assistance (transportation, D/C planning, etc.) '[]'$  - 0 Additional assistance / Altered mentation '[]'$  - 0 Support Surface([name]) Assessment (bed, cushion, seat, etc.) INTERVENTIONS - Wound Cleansing / Measurement  X - Simple Wound Cleansing - one wound 1 5 '[]'$  - 0 Complex Wound Cleansing - multiple wounds X- 1 5 Wound Imaging (photographs - any number of wounds) '[]'$  - 0 Wound Tracing (instead of photographs) X- 1 5 Simple Wound Measurement - one wound '[]'$  - 0 Complex Wound Measurement - multiple wounds INTERVENTIONS - Wound Dressings X - Small Wound Dressing one or multiple wounds 1 10 '[]'$  - 0 Medium Wound Dressing one or multiple wounds '[]'$  - 0 Large Wound Dressing one or multiple wounds X- 1 5 Application of Medications - topical '[]'$  - 0 Application of Medications - injection INTERVENTIONS - Miscellaneous '[]'$  - 0 External ear exam '[]'$  - 0 Specimen Collection (cultures, biopsies, blood, body fluids, etc.) '[]'$  - 0 Specimen([name]) / Culture([name]) sent or taken to Lab for analysis '[]'$  - 0 Patient Transfer (multiple staff / Civil Service fast streamer / Similar devices) '[]'$  - 0 Simple Staple / Suture removal (25 or less) '[]'$  - 0 Complex Staple / Suture removal (26 or more) '[]'$  - 0 Hypo / Hyperglycemic Management (close monitor of Blood Glucose) Lahm, Collier [name] ([id number]) [id number] Page 3 of 11 '[]'$  - 0 Ankle / Brachial Index (ABI) - do not check if billed separately X- 1 5 Vital Signs Has the patient been seen at the hospital within the last three years: Yes Total Score: 95 Level Of Care: New/Established - Level 3 Electronic Signature([name]) Signed: 02/19/2022 4:09:44 PM By: Rhae Hammock RN Entered By: Rhae Hammock on 02/18/2022 13:29:35 -------------------------------------------------------------------------------- Encounter Discharge Information Details Patient Name: Date of  Service: John Kelp. 02/18/2022 12:30 PM Medical Record Number: [id number] Patient Account Number: [id number] Date of Birth/Sex: Treating RN: Mar 10, 1965 (57 y.o. [name], [name] Primary Care Liyat Faulkenberry: [name] Other Clinician: Referring Ahijah Devery: Treating [place] Merida/Extender: [name] in Treatment: 1 Encounter Discharge Information Items Post Procedure Vitals Discharge Condition: Stable Temperature (F): 98.7 Ambulatory Status: Ambulatory Pulse (bpm): 74 Discharge Destination: Home Respiratory Rate (breaths/min): 17 Transportation: Private Auto Blood Pressure (mmHg): 120/80 Accompanied By: self Schedule Follow-up Appointment: Yes Clinical Summary of Care: Patient Declined Electronic Signature([name]) Signed: 02/19/2022 4:09:44 PM By: Rhae Hammock RN Entered By: Rhae Hammock on 02/18/2022 13:30:12 -------------------------------------------------------------------------------- Lower Extremity Assessment Details Patient Name: Date of Service: [name], [name] 02/18/2022 12:30 PM Medical Record Number: [id number] Patient Account Number: [id number] Date of Birth/Sex: Treating RN: Aug 17, 1965 (57 y.o. M) Primary Care Ellison Rieth: [name] Other Clinician: Referring Rateel Beldin: Treating Karlos Scadden/Extender: [name]: 1 Edema Assessment Assessed: [Left: No] [Right: No] Edema: [Left: Ye] [Right: [name]] Calf Left: Right: Point of Measurement: 43 cm From Medial Instep 40 cm Ankle Left: Right: Point of Measurement: 11 cm From Medial Instep 29 cm Electronic Signature([name]) Signed: 02/18/2022 4:56:53 PM By: Sanjuan Dame, Carvin [name] ([id number]) By: Dorita Fray.pdf Page 4 of 11 Signed: 02/18/2022 4:56:53 PM Entered By: Erenest Blank on 02/18/2022 12:53:12 -------------------------------------------------------------------------------- Multi  Wound Chart Details Patient Name: Date of Service: [name], [name] 02/18/2022 12:30 PM Medical Record Number: [id number] Patient Account Number: [id number] Date of Birth/Sex: Treating RN: [birth year] (57 y.o. M) Primary Care Daevon Holdren: [name] Other Clinician: Referring Flara Storti: Treating Tanza Pellot/Extender: [name] in Treatment: 1 Vital Signs Height(in): 75 Pulse(bpm): 68 Weight(lbs): 220 Blood Pressure(mmHg): 148/77 Body Mass Index(BMI): 27.5 Temperature(F): 98.5 Respiratory Rate(breaths/min): 18 [1:Photos:] [N/A:N/A] Left T Great oe Left, Plantar Foot N/A Wound Location: Shear/Friction Surgical Injury N/A Wounding Event: Neuropathic Ulcer-Non Diabetic Open Surgical Wound N/A Primary Etiology: Lymphedema, Sleep Apnea,  Lymphedema, Sleep Apnea, N/A Comorbid History: Hypertension, Peripheral Venous Hypertension, Peripheral Venous Disease, Osteoarthritis, Neuropathy Disease, Osteoarthritis, Neuropathy 01/28/2022 01/14/2022 N/A Date Acquired: 1 1 N/A Weeks of Treatment: Open Open N/A Wound Status: No No N/A Wound Recurrence: Yes Yes N/A Pending A mputation on Presentation: 0.7x0.8x0.1 0.3x0.3x0.3 N/A Measurements L x W x D (cm) 0.44 0.071 N/A A (cm) : rea 0.044 0.021 N/A Volume (cm) : 63.60% 39.80% N/A % Reduction in A rea: 63.60% 12.50% N/A % Reduction in Volume: Full Thickness Without Exposed Full Thickness Without Exposed N/A Classification: Support Structures Support Structures Medium Medium N/A Exudate A mount: Serosanguineous Serosanguineous N/A Exudate Type: red, brown red, brown N/A Exudate Color: Distinct, outline attached Distinct, outline attached N/A Wound Margin: Large (67-100%) Large (67-100%) N/A Granulation A mount: Red, Pink Pink N/A Granulation Quality: None Present (0%) None Present (0%) N/A Necrotic A mount: Fat Layer (Subcutaneous Tissue): Yes Fat Layer (Subcutaneous Tissue): Yes  N/A Exposed Structures: Fascia: No Fascia: No Tendon: No Tendon: No Muscle: No Muscle: No Joint: No Joint: No Bone: No Bone: No Small (1-33%) None N/A Epithelialization: N/A Debridement - Excisional N/A Debridement: Pre-procedure Verification/Time Out N/A 13:16 N/A Taken: N/A Lidocaine N/A Pain Control: N/A Callus, Subcutaneous, Slough N/A Tissue Debrided: N/A Skin/Subcutaneous Tissue N/A Level: N/A 0.09 N/A Debridement A (sq cm): rea N/A Curette N/A Instrument: N/A Minimum N/A Bleeding: N/A Pressure N/A Hemostasis A chievedCAROLOS, [name] ([id number]) [id number] Page 5 of 11 N/A 0 N/A Procedural Pain: N/A 0 N/A Post Procedural Pain: N/A Procedure was tolerated well N/A Debridement Treatment Response: N/A 0.3x0.3x0.3 N/A Post Debridement Measurements L x W x D (cm) N/A 0.021 N/A Post Debridement Volume: (cm) Callus: Yes Callus: Yes N/A Periwound Skin Texture: Excoriation: No Excoriation: No Induration: No Induration: No Crepitus: No Crepitus: No Rash: No Rash: No Scarring: No Scarring: No Maceration: No Maceration: No N/A Periwound Skin Moisture: Dry/Scaly: No Dry/Scaly: No Atrophie Blanche: No Atrophie Blanche: No N/A Periwound Skin Color: Cyanosis: No Cyanosis: No Ecchymosis: No Ecchymosis: No Erythema: No Erythema: No Hemosiderin Staining: No Hemosiderin Staining: No Mottled: No Mottled: No Pallor: No Pallor: No Rubor: No Rubor: No N/A Debridement N/A Procedures Performed: Treatment Notes Wound #1 (Toe Great) Wound Laterality: Left Cleanser Wound Cleanser Discharge Instruction: Cleanse the wound with wound cleanser prior to applying a clean dressing using gauze sponges, not tissue or cotton balls. Byram Ancillary Kit - 15 Day Supply Discharge Instruction: Use supplies as instructed; Kit contains: (15) Saline Bullets; (15) 3x3 Gauze; 15 pr Gloves Peri-Wound Care Topical Primary  Dressing Hydrofera Blue Ready Transfer Foam, 2.5x2.5 (in/in) Discharge Instruction: Apply directly to wound bed as directed MediHoney Gel, tube 1.5 (oz) Discharge Instruction: Apply to wound bed as instructed Secondary Dressing Optifoam Non-Adhesive Dressing, 4x4 in Discharge Instruction: Apply x1 layer donut to aid in offloading. Woven Gauze Sponges 2x2 in Discharge Instruction: Apply over primary dressing as directed. Secured With Conforming Stretch Gauze Bandage, Sterile 2x75 (in/in) Discharge Instruction: Secure with stretch gauze as directed. 68M Medipore H Soft Cloth Surgical T ape, 4 x 10 (in/yd) Discharge Instruction: Secure with tape as directed. Compression Wrap Compression Stockings Add-Ons Wound #2 (Foot) Wound Laterality: Plantar, Left Cleanser Wound Cleanser Discharge Instruction: Cleanse the wound with wound cleanser prior to applying a clean dressing using gauze sponges, not tissue or cotton balls. Byram Ancillary Kit - 15 Day Supply Discharge Instruction: Use supplies as instructed; Kit contains: (15) Saline Bullets; (15) 3x3 Gauze; 15 pr Gloves Peri-Wound Care Topical Primary Dressing Hydrofera  Blue Ready Transfer Foam, 2.5x2.5 (in/in) Discharge Instruction: Apply directly to wound bed as directed JERAMIAH, MCCAUGHEY [name] ([id number]) [id number] Page 6 of 11 MediHoney Gel, tube 1.5 (oz) Discharge Instruction: Apply to wound bed as instructed Secondary Dressing Optifoam Non-Adhesive Dressing, 4x4 in Discharge Instruction: Apply x1 layer donut to aid in offloading. Woven Gauze Sponges 2x2 in Discharge Instruction: Apply over primary dressing as directed. Secured With Conforming Stretch Gauze Bandage, Sterile 2x75 (in/in) Discharge Instruction: Secure with stretch gauze as directed. 10M Medipore H Soft Cloth Surgical T ape, 4 x 10 (in/yd) Discharge Instruction: Secure with tape as directed. Compression Wrap Compression  Stockings Add-Ons Electronic Signature([name]) Signed: 02/18/2022 4:21:39 PM By: Kalman Shan DO Entered By: Kalman Shan on 02/18/2022 13:38:05 -------------------------------------------------------------------------------- Multi-Disciplinary Care Plan Details Patient Name: Date of Service: [name], [name] 02/18/2022 12:30 PM Medical Record Number: [id number] Patient Account Number: [id number] Date of Birth/Sex: Treating RN: [birth year] (57 y.o. [name], [name] Primary Care Railee Bonillas: [name] Other Clinician: Referring Lilla Callejo: Treating Romulus Hanrahan/Extender: [name] in Treatment: 1 Active Inactive Peripheral Neuropathy Nursing Diagnoses: Potential alteration in peripheral tissue perfusion (select prior to confirmation of diagnosis) Goals: Patient/caregiver will verbalize understanding of disease process and disease management Date Initiated: 02/11/2022 Target Resolution Date: 03/07/2022 Goal Status: Active Interventions: Assess signs and symptoms of neuropathy upon admission and as needed Provide education on Management of Neuropathy upon discharge from the [place]: Patient referred for customized footwear/offloading : 02/11/2022 Notes: Wound/Skin Impairment Nursing Diagnoses: Knowledge deficit related to ulceration/compromised skin integrity Goals: Patient/caregiver will verbalize understanding of skin care regimen Date Initiated: 02/11/2022 Target Resolution Date: 03/07/2022 Goal Status: Active TAHJAI, SCHETTER ([id number]) [id number] Page 7 of 11 Interventions: Assess patient/caregiver ability to perform ulcer/skin care regimen upon admission and as needed Assess ulceration([name]) every visit Provide education on smoking Provide education on ulcer and skin care Treatment Activities: Skin care regimen initiated : 02/11/2022 Smoking cessation education : 02/11/2022 Topical  wound management initiated : 02/11/2022 Notes: Electronic Signature([name]) Signed: 02/19/2022 4:09:44 PM By: Rhae Hammock RN Entered By: Rhae Hammock on 02/18/2022 12:55:25 -------------------------------------------------------------------------------- Pain Assessment Details Patient Name: Date of Service: [name], [name] 02/18/2022 12:30 PM Medical Record Number: [id number] Patient Account Number: [id number] Date of Birth/Sex: Treating RN: Dec 06, 1965 (57 y.o. M) Primary Care Cleatus Goodin: [name] Other Clinician: Referring Jagdeep Ancheta: Treating Arvle Grabe/Extender: [name] in Treatment: 1 Active Problems Location of Pain Severity and Description of Pain Patient Has Paino No Site Locations Pain Management and Medication Current Pain Management: Electronic Signature([name]) Signed: 02/18/2022 4:56:53 PM By: Erenest Blank Entered By: Erenest Blank on 02/18/2022 12:46:06 Kuznicki, Marvion [name] ([id number]) [id number] Page 8 of 11 -------------------------------------------------------------------------------- Patient/Caregiver Education Details Patient Name: Date of Service: [name], [name] 1/23/2024andnbsp12:30 PM Medical Record Number: [id number] Patient Account Number: [id number] Date of Birth/Gender: Treating RN: 1965-10-25 (57 y.o. Erie Noe Primary Care Physician: [name] Other Clinician: Referring Physician: Treating Physician/Extender: [name] in Treatment: 1 Education Assessment Education Provided To: Patient Education Topics Provided Wound/Skin Impairment: Methods: Explain/Verbal Responses: Reinforcements needed, State content correctly Electronic Signature([name]) Signed: 02/19/2022 4:09:44 PM By: Rhae Hammock RN Entered By: Rhae Hammock on 02/18/2022  12:55:37 -------------------------------------------------------------------------------- Wound Assessment Details Patient Name: Date of Service: [name], [name] 02/18/2022 12:30 PM Medical Record Number: [id number] Patient Account Number: [id number] Date of Birth/Sex: Treating RN: 01-04-1966 (57 y.o. M) Primary Care Terrace Chiem: [name] Other Clinician: Referring Pierra Skora: Treating Malory Spurr/Extender: [name]: 1 Wound Status  Wound Number: 1 Primary Neuropathic Ulcer-Non Diabetic Etiology: Wound Location: Left T Great oe Wound Open Wounding Event: Shear/Friction Status: Date Acquired: 01/28/2022 Comorbid Lymphedema, Sleep Apnea, Hypertension, Peripheral Venous Weeks Of Treatment: 1 History: Disease, Osteoarthritis, Neuropathy Clustered Wound: No Pending Amputation On Presentation Photos Wound Measurements Length: (cm) 0 Width: (cm) 0 Depth: (cm) 0 Area: (cm) Volume: (cm) .7 % Reduction in Area: 63.6% .8 % Reduction in Volume: 63.6% .1 Epithelialization: Small (1-33%) 0.44 Tunneling: No 0.044 Undermining: No Wound Description TYRONNE, BLANN [name] ([id number]) Classification: Full Thickness Without Exposed Support Structures Wound Margin: Distinct, outline attached Exudate Amount: Medium Exudate Type: Serosanguineous Exudate Color: red, brown [id number] Page 9 of 11 Foul Odor After Cleansing: No Slough/Fibrino Yes Wound Bed Granulation Amount: Large (67-100%) Exposed Structure Granulation Quality: Red, Pink Fascia Exposed: No Necrotic Amount: None Present (0%) Fat Layer (Subcutaneous Tissue) Exposed: Yes Tendon Exposed: No Muscle Exposed: No Joint Exposed: No Bone Exposed: No Periwound Skin Texture Texture Color No Abnormalities Noted: No No Abnormalities Noted: No Callus: Yes Atrophie Blanche: No Crepitus: No Cyanosis: No Excoriation: No Ecchymosis: No Induration:  No Erythema: No Rash: No Hemosiderin Staining: No Scarring: No Mottled: No Pallor: No Moisture Rubor: No No Abnormalities Noted: No Dry / Scaly: No Maceration: No Treatment Notes Wound #1 (Toe Great) Wound Laterality: Left Cleanser Wound Cleanser Discharge Instruction: Cleanse the wound with wound cleanser prior to applying a clean dressing using gauze sponges, not tissue or cotton balls. Byram Ancillary Kit - 15 Day Supply Discharge Instruction: Use supplies as instructed; Kit contains: (15) Saline Bullets; (15) 3x3 Gauze; 15 pr Gloves Peri-Wound Care Topical Primary Dressing Hydrofera Blue Ready Transfer Foam, 2.5x2.5 (in/in) Discharge Instruction: Apply directly to wound bed as directed MediHoney Gel, tube 1.5 (oz) Discharge Instruction: Apply to wound bed as instructed Secondary Dressing Optifoam Non-Adhesive Dressing, 4x4 in Discharge Instruction: Apply x1 layer donut to aid in offloading. Woven Gauze Sponges 2x2 in Discharge Instruction: Apply over primary dressing as directed. Secured With Conforming Stretch Gauze Bandage, Sterile 2x75 (in/in) Discharge Instruction: Secure with stretch gauze as directed. 66M Medipore H Soft Cloth Surgical T ape, 4 x 10 (in/yd) Discharge Instruction: Secure with tape as directed. Compression Wrap Compression Stockings Add-Ons Electronic Signature([name]) Signed: 02/18/2022 4:56:53 PM By: Erenest Blank Entered By: Erenest Blank on 02/18/2022 12:55:56 Cuffe, Antionette Char ([id number]) [id number] Page 10 of 11 -------------------------------------------------------------------------------- Wound Assessment Details Patient Name: Date of Service: [name], [name] 02/18/2022 12:30 PM Medical Record Number: [id number] Patient Account Number: [id number] Date of Birth/Sex: Treating RN: [birth year] (57 y.o. M) Primary Care Faigy Stretch: [name] Other Clinician: Referring Harshal Sirmon: Treating Leni Pankonin/Extender:  [name]: 1 Wound Status Wound Number: 2 Primary Open Surgical Wound Etiology: Wound Location: Left, Plantar Foot Wound Open Wounding Event: Surgical Injury Status: Date Acquired: 01/14/2022 Comorbid Lymphedema, Sleep Apnea, Hypertension, Peripheral Venous Weeks Of Treatment: 1 History: Disease, Osteoarthritis, Neuropathy Clustered Wound: No Pending Amputation On Presentation Photos Wound Measurements Length: (cm) 0.3 Width: (cm) 0.3 Depth: (cm) 0.3 Area: (cm) 0.071 Volume: (cm) 0.021 % Reduction in Area: 39.8% % Reduction in Volume: 12.5% Epithelialization: None Tunneling: No Undermining: No Wound Description Classification: Full Thickness Without Exposed Suppor Wound Margin: Distinct, outline attached Exudate Amount: Medium Exudate Type: Serosanguineous Exudate Color: red, brown t Structures Foul Odor After Cleansing: No Slough/Fibrino Yes Wound Bed Granulation Amount: Large (67-100%) Exposed Structure Granulation Quality: Pink Fascia Exposed: No Necrotic Amount: None Present (0%) Fat Layer (Subcutaneous Tissue) Exposed: Yes Tendon Exposed: No Muscle Exposed: No  Joint Exposed: No Bone Exposed: No Periwound Skin Texture Texture Color No Abnormalities Noted: No No Abnormalities Noted: No Callus: Yes Atrophie Blanche: No Crepitus: No Cyanosis: No Excoriation: No Ecchymosis: No Induration: No Erythema: No Rash: No Hemosiderin Staining: No Scarring: No Mottled: No Pallor: No Moisture Rubor: No No Abnormalities Noted: No Dry / Scaly: No Maceration: No [name], [name] ([id number]) [id number] Page 11 of 11 Treatment Notes Wound #2 (Foot) Wound Laterality: Plantar, Left Cleanser Wound Cleanser Discharge Instruction: Cleanse the wound with wound cleanser prior to applying a clean dressing using gauze sponges, not tissue or cotton balls. Byram Ancillary Kit - 15 Day  Supply Discharge Instruction: Use supplies as instructed; Kit contains: (15) Saline Bullets; (15) 3x3 Gauze; 15 pr Gloves Peri-Wound Care Topical Primary Dressing Hydrofera Blue Ready Transfer Foam, 2.5x2.5 (in/in) Discharge Instruction: Apply directly to wound bed as directed MediHoney Gel, tube 1.5 (oz) Discharge Instruction: Apply to wound bed as instructed Secondary Dressing Optifoam Non-Adhesive Dressing, 4x4 in Discharge Instruction: Apply x1 layer donut to aid in offloading. Woven Gauze Sponges 2x2 in Discharge Instruction: Apply over primary dressing as directed. Secured With Conforming Stretch Gauze Bandage, Sterile 2x75 (in/in) Discharge Instruction: Secure with stretch gauze as directed. 9M Medipore H Soft Cloth Surgical T ape, 4 x 10 (in/yd) Discharge Instruction: Secure with tape as directed. Compression Wrap Compression Stockings Add-Ons Electronic Signature([name]) Signed: 02/18/2022 4:56:53 PM By: Erenest Blank Entered By: Erenest Blank on 02/18/2022 12:56:35 -------------------------------------------------------------------------------- Vitals Details Patient Name: Date of Service: John Kelp. 02/18/2022 12:30 PM Medical Record Number: [id number] Patient Account Number: [id number] Date of Birth/Sex: Treating RN: [birth year] (57 y.o. M) Primary Care Sephora Boyar: [name] Other Clinician: Referring Quinton Voth: Treating Koby Hartfield/Extender: [name] in Treatment: 1 Vital Signs Time Taken: 12:44 Temperature (F): 98.5 Height (in): 75 Pulse (bpm): 68 Weight (lbs): 220 Respiratory Rate (breaths/min): 18 Body Mass Index (BMI): 27.5 Blood Pressure (mmHg): 148/77 Reference Range: 80 - 120 mg / dl Electronic Signature([name]) Signed: 02/18/2022 4:56:53 PM By: Erenest Blank Entered By: Erenest Blank on 02/18/2022 12:45:58

## 2022-02-19 NOTE — Progress Notes (Signed)
[name], [name] ([id number]) [id number] Page 1 of 8 Visit Report for 02/18/2022 Chief Complaint Document Details Patient Name: Date of Service: [name], [name] 02/18/2022 12:30 PM Medical Record Number: [id number] Patient Account Number: [id number] Date of Birth/Sex: Treating RN: 12-17-1965 (57 y.o. M) Primary Care Provider: [name] Other Clinician: Referring Provider: Treating Provider/Extender: [name] in Treatment: 1 Information Obtained from: Patient Chief Complaint 02/11/2022 left great toe wound and fourth sub-met head wound Electronic Signature([name]) Signed: 02/18/2022 4:21:39 PM By: Kalman Shan DO Entered By: Kalman Shan on 02/18/2022 13:38:13 -------------------------------------------------------------------------------- Debridement Details Patient Name: Date of Service: [name]. 02/18/2022 12:30 PM Medical Record Number: [id number] Patient Account Number: [id number] Date of Birth/Sex: Treating RN: 20-Feb-1965 (57 y.o. [name], [name] Primary Care Provider: [name] Other Clinician: Referring Provider: Treating Provider/Extender: [name]: 1 Debridement Performed for Assessment: Wound #2 Left,Plantar Foot Performed By: Physician Kalman Shan, DO Debridement Type: Debridement Level of Consciousness (Pre-procedure): Awake and Alert Pre-procedure Verification/Time Out Yes - 13:16 Taken: Start Time: 13:16 Pain Control: Lidocaine T Area Debrided (L x W): otal 0.3 (cm) x 0.3 (cm) = 0.09 (cm) Tissue and other material debrided: Viable, Non-Viable, Callus, Slough, Subcutaneous, Slough Level: Skin/Subcutaneous Tissue Debridement Description: Excisional Instrument: Curette Bleeding: Minimum Hemostasis Achieved: Pressure End Time: 13:16 Procedural Pain: 0 Post Procedural Pain: 0 Response to Treatment: Procedure was  tolerated well Level of Consciousness (Post- Awake and Alert procedure): Post Debridement Measurements of Total Wound Length: (cm) 0.3 Width: (cm) 0.3 Depth: (cm) 0.3 Volume: (cm) 0.021 Character of Wound/Ulcer Post Debridement: Improved Post Procedure Diagnosis [name], [name] ([id number]) [id number] Page 2 of 8 Same as Pre-procedure Electronic Signature([name]) Signed: 02/18/2022 4:21:39 PM By: Kalman Shan DO Signed: 02/19/2022 4:09:44 PM By: Rhae Hammock RN Entered By: Rhae Hammock on 02/18/2022 13:16:46 -------------------------------------------------------------------------------- HPI Details Patient Name: Date of Service: [name] 02/18/2022 12:30 PM Medical Record Number: [id number] Patient Account Number: [id number] Date of Birth/Sex: Treating RN: [birth year] (57 y.o. M) Primary Care Provider: [name] Other Clinician: Referring Provider: Treating Provider/Extender: [name] in Treatment: 1 History of Present Illness HPI Description: 02/11/2022 [name] is a 57 year old male with a past medical history of current tobacco user, OSA, venous insufficiency and peripheral neuropathy that presents the clinic for a 2-week history of ulcer to the left great toe and 2-8-monthhistory of nonhealing ulcer to the left plantar foot. [place] all day. He wears steel toed boots. He is currently out of work due to his wounds. On 11/26/2021 he was admitted to the hospital for cellulitis and abscess to the left foot. 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 He states he has been on doxycycline for 10 weeks. He just completed this course. He uses regular tennis shoes for walking. He is not offloading the area. He is using Betadine for wound dressings. He has been following with Dr. PPosey Pronto podiatry who Referred to our  clinic. 1/23; patient presents for follow-up. He has been using Medihoney and Hydrofera Blue to the wound beds Along with his surgical shoe and offloading peg assist. He has no issues or complaints today. He denies signs of infection. Electronic Signature([name]) Signed: 02/18/2022 4:21:39 PM By: HKalman ShanDO Entered By: HKalman Shanon 02/18/2022 13:39:06 -------------------------------------------------------------------------------- Physical Exam Details Patient Name: Date of Service: [name], [name]1/23/2024 12:30 PM Medical Record Number: [id number] Account Number:  [id number] Date of Birth/Sex: Treating RN: March 06, 1965 (57 y.o. M) Primary Care Provider: [name] Other Clinician: Referring Provider: Treating Provider/Extender: [name] in Treatment: 1 Constitutional respirations regular, non-labored and within target range for patient.. Cardiovascular 2+ dorsalis pedis/posterior tibialis pulses. Psychiatric pleasant and cooperative. Notes Left foot: T the plantar aspect of the great toe there is an open wound with granulation tissue and tightly adhered fibrinous tissue. T the plantar aspect of the o o foot there is an open wound with nonviable tissue and granulation tissue. No signs of surrounding soft tissue infection to any of the wound beds. Electronic Signature([name]) Signed: 02/18/2022 4:21:39 PM By: Marga Melnick, [name] ([id number]) [id number] Page 3 of 8 Entered By: Kalman Shan on 02/18/2022 13:40:31 -------------------------------------------------------------------------------- Physician Orders Details Patient Name: Date of Service: [name], [name] 02/18/2022 12:30 PM Medical Record Number: [id number] Patient Account Number: [id number] Date of Birth/Sex: Treating RN: 01/03/1966 (57 y.o. [name], [name] Primary Care Provider: [name] Other  Clinician: Referring Provider: Treating Provider/Extender: [name] in Treatment: 1 Verbal / Phone Orders: No Diagnosis Coding Follow-up Appointments ppointment in 1 week. - Dr. Heber [place] 02/25/22 @ 1:15 Rm # 9 w/ Allayne Butcher AND Thursday 02/02 @ 2:30 Rm # 9 Return A Anesthetic (In clinic) Topical Lidocaine 4% applied to wound bed Bathing/ Shower/ Hygiene May shower with protection but do not get wound dressing([name]) wet. Protect dressing([name]) with water repellant cover (for example, large plastic bag) or a cast cover and may then take shower. Edema Control - Lymphedema / SCD / Other Elevate legs to the level of the heart or above for 30 minutes daily and/or when sitting for 3-4 times a day throughout the day. Avoid standing for long periods of time. Off-Loading Open toe surgical shoe to: - with offloading Peg Assist to aid in offloading pressure to left plantar foot. Wound Treatment Wound #1 - T Great oe Wound Laterality: Left Cleanser: Wound Cleanser 1 x Per Day/30 Days Discharge Instructions: Cleanse the wound with wound cleanser prior to applying a clean dressing using gauze sponges, not tissue or cotton balls. Cleanser: Byram Ancillary Kit - 15 Day Supply (Generic) 1 x Per Day/30 Days Discharge Instructions: Use supplies as instructed; Kit contains: (15) Saline Bullets; (15) 3x3 Gauze; 15 pr Gloves Prim Dressing: Hydrofera Blue Ready Transfer Foam, 2.5x2.5 (in/in) (Generic) 1 x Per Day/30 Days ary Discharge Instructions: Apply directly to wound bed as directed Prim Dressing: MediHoney Gel, tube 1.5 (oz) 1 x Per Day/30 Days ary Discharge Instructions: Apply to wound bed as instructed Secondary Dressing: Optifoam Non-Adhesive Dressing, 4x4 in (Generic) 1 x Per Day/30 Days Discharge Instructions: Apply x1 layer donut to aid in offloading. Secondary Dressing: Woven Gauze Sponges 2x2 in (Generic) 1 x Per Day/30 Days Discharge Instructions: Apply over primary  dressing as directed. Secured With: Child psychotherapist, Sterile 2x75 (in/in) (Generic) 1 x Per Day/30 Days Discharge Instructions: Secure with stretch gauze as directed. Secured With: 85M Medipore H Soft Cloth Surgical T ape, 4 x 10 (in/yd) (Generic) 1 x Per Day/30 Days Discharge Instructions: Secure with tape as directed. Wound #2 - Foot Wound Laterality: Plantar, Left Cleanser: Wound Cleanser 1 x Per Day/30 Days Discharge Instructions: Cleanse the wound with wound cleanser prior to applying a clean dressing using gauze sponges, not tissue or cotton balls. Cleanser: Byram Ancillary Kit - 15 Day Supply (Generic) 1 x Per Day/30 Days Discharge Instructions: Use supplies as instructed; Kit contains: (15) Saline Bullets; (  15) 3x3 Gauze; 15 pr Gloves Prim Dressing: Hydrofera Blue Ready Transfer Foam, 2.5x2.5 (in/in) (Generic) 1 x Per Day/30 Days ary Discharge Instructions: Apply directly to wound bed as directed Prim Dressing: MediHoney Gel, tube 1.5 (oz) 1 x Per Day/30 Days ary Discharge Instructions: Apply to wound bed as instructed JEROMIAH, OHALLORAN [name] ([id number]) [id number] Page 4 of 8 Secondary Dressing: Optifoam Non-Adhesive Dressing, 4x4 in (Generic) 1 x Per Day/30 Days Discharge Instructions: Apply x1 layer donut to aid in offloading. Secondary Dressing: Woven Gauze Sponges 2x2 in (Generic) 1 x Per Day/30 Days Discharge Instructions: Apply over primary dressing as directed. Secured With: Child psychotherapist, Sterile 2x75 (in/in) (Generic) 1 x Per Day/30 Days Discharge Instructions: Secure with stretch gauze as directed. Secured With: 40M Medipore H Soft Cloth Surgical T ape, 4 x 10 (in/yd) (Generic) 1 x Per Day/30 Days Discharge Instructions: Secure with tape as directed. Electronic Signature([name]) Signed: 02/18/2022 4:21:39 PM By: Kalman Shan DO Entered By: Kalman Shan on 02/18/2022  13:40:42 -------------------------------------------------------------------------------- Problem List Details Patient Name: Date of Service: [name], [name] 02/18/2022 12:30 PM Medical Record Number: [id number] Patient Account Number: [id number] Date of Birth/Sex: Treating RN: [birth year] (57 y.o. M) Primary Care Provider: [name] Other Clinician: Referring Provider: Treating Provider/Extender: [name] in Treatment: 1 Active Problems ICD-10 Encounter Code Description Active Date MDM Diagnosis [phone] Non-pressure chronic ulcer of other part of 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 02/11/2022 No Yes G90.09 Other idiopathic peripheral autonomic neuropathy 02/11/2022 No Yes G47.33 Obstructive sleep apnea (adult) (pediatric) 02/11/2022 No Yes I87.2 Venous insufficiency (chronic) (peripheral) 02/11/2022 No Yes Inactive Problems Resolved Problems Electronic Signature([name]) Signed: 02/18/2022 4:21:39 PM By: Kalman Shan DO Entered By: Kalman Shan on 02/18/2022 13:37:50 Shimon, Laurent [name] ([id number]) [id number] Page 5 of 8 -------------------------------------------------------------------------------- Progress Note Details Patient Name: Date of Service: [name], [name] 02/18/2022 12:30 PM Medical Record Number: [id number] Patient Account Number: [id number] Date of Birth/Sex: Treating RN: [birth year] (57 y.o. M) Primary Care Provider: [name] Other Clinician: Referring Provider: Treating Provider/Extender: [name] in Treatment: 1 Subjective Chief Complaint Information obtained from Patient 02/11/2022 left great toe wound and fourth sub-met head wound History of Present Illness (HPI) 02/11/2022 Mr. Jaimes Eckert is a 57 year old male with a past medical history of current tobacco user, OSA, venous insufficiency and peripheral neuropathy that presents the clinic  for a 2-week history of ulcer to the left great toe and 2-98-monthhistory of nonhealing ulcer to the left plantar foot. [place]. He wears steel toed boots. He is currently out of work due to his wounds. On 11/26/2021 he was admitted to the hospital for cellulitis and abscess to the left foot. 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 He states he has been on doxycycline for 10 weeks. He just completed this course. He uses regular tennis shoes for walking. He is not offloading the area. He is using Betadine for wound dressings. He has been following with Dr. PPosey Pronto podiatry who Referred to our clinic. 1/23; patient presents for follow-up. He has been using Medihoney and Hydrofera Blue to the wound beds Along with his surgical shoe and offloading peg assist. He has no issues or complaints today. He denies signs of infection. Patient History Family History Heart Disease - Mother, Lung Disease - Mother. Social History Current every day smoker - 10 cig a day, Alcohol Use - Never, Drug Use -  No History, Caffeine Use - Never. Medical History Hematologic/Lymphatic Patient has history of Lymphedema - pumps use daily Respiratory Patient has history of Sleep Apnea - CPAP Cardiovascular Patient has history of Hypertension, Peripheral Venous Disease - needs ablation on left leg Musculoskeletal Patient has history of Osteoarthritis Denies history of Osteomyelitis Neurologic Patient has history of Neuropathy Hospitalization/Surgery History - 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. Medical A Surgical History Notes nd Musculoskeletal abscess to left foot 3rd toe 11/28/2022 Objective Constitutional respirations regular, non-labored and within target range for patient.. Vitals Time Taken: 12:44 PM, Height: 75 in, Weight: 220 lbs, BMI: 27.5, Temperature: 98.5 F, Pulse: 68 bpm, Respiratory Rate: 18 breaths/min,  Blood Pressure: 148/77 mmHg. Cardiovascular 2+ dorsalis pedis/posterior tibialis pulses. Psychiatric pleasant and cooperative. General Notes: Left foot: T the plantar aspect of the great toe there is an open wound with granulation tissue and tightly adhered fibrinous tissue. T the o o plantar aspect of the foot there is an open wound with nonviable tissue and granulation tissue. No signs of surrounding soft tissue infection to any of the wound beds. Integumentary (Hair, Skin) Wound #1 status is Open. Original cause of wound was Shear/Friction. The date acquired was: 01/28/2022. The wound has been in treatment 1 weeks. The wound [name], [name] ([id number]) [id number] Page 6 of 8 is located on the Left T Great. The wound measures 0.7cm length x 0.8cm width x 0.1cm depth; 0.44cm^2 area and 0.044cm^3 volume. There is Fat Layer oe (Subcutaneous Tissue) exposed. There is no tunneling or undermining noted. There is a medium amount of serosanguineous drainage noted. The wound margin is distinct with the outline attached to the wound base. There is large (67-100%) red, pink granulation within the wound bed. There is no necrotic tissue within the wound bed. The periwound skin appearance exhibited: Callus. The periwound skin appearance did not exhibit: Crepitus, Excoriation, Induration, Rash, Scarring, Dry/Scaly, Maceration, Atrophie Blanche, Cyanosis, Ecchymosis, Hemosiderin Staining, Mottled, Pallor, Rubor, Erythema. Wound #2 status is Open. Original cause of wound was Surgical Injury. The date acquired was: 01/14/2022. The wound has been in treatment 1 weeks. The wound is located on the [place]. The wound measures 0.3cm length x 0.3cm width x 0.3cm depth; 0.071cm^2 area and 0.021cm^3 volume. There is Fat Layer (Subcutaneous Tissue) exposed. There is no tunneling or undermining noted. There is a medium amount of serosanguineous drainage noted. The wound margin is  distinct with the outline attached to the wound base. There is large (67-100%) pink granulation within the wound bed. There is no necrotic tissue within the wound bed. The periwound skin appearance exhibited: Callus. The periwound skin appearance did not exhibit: Crepitus, Excoriation, Induration, Rash, Scarring, Dry/Scaly, Maceration, Atrophie Blanche, Cyanosis, Ecchymosis, Hemosiderin Staining, Mottled, Pallor, Rubor, Erythema. 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 I debrided nonviable tissue. No signs of infection. I think he would benefit from a total contact cast and we discussed this today. Patient would like to proceed with this. Plan is to place this at next clinic visit. For now he can continue with Medihoney and Hydrofera Blue and aggressive offloading with a surgical shoe and peg assist. Procedures Wound #2 Pre-procedure diagnosis of Wound #2 is an Open Surgical Wound located on the [place] . There was a Excisional Skin/Subcutaneous Tissue  Debridement with a total area of 0.09 sq cm performed by Kalman Shan, DO. With the following instrument([name]): Curette to remove Viable and Non-Viable tissue/material. Material removed includes Callus, Subcutaneous Tissue, and Slough after achieving pain control using Lidocaine. No specimens were taken. A time out was conducted at 13:16, prior to the start of the procedure. A Minimum amount of bleeding was controlled with Pressure. The procedure was tolerated well with a pain level of 0 throughout and a pain level of 0 following the procedure. Post Debridement Measurements: 0.3cm length x 0.3cm width x 0.3cm depth; 0.021cm^3 volume. Character of  Wound/Ulcer Post Debridement is improved. Post procedure Diagnosis Wound #2: Same as Pre-Procedure Plan Follow-up Appointments: Return Appointment in 1 week. - Dr. Heber [place] 02/25/22 @ 1:15 Rm # 9 w/ Allayne Butcher AND Thursday 02/02 @ 2:30 Rm # 9 Anesthetic: (In clinic) Topical Lidocaine 4% applied to wound bed Bathing/ Shower/ Hygiene: May shower with protection but do not get wound dressing([name]) wet. Protect dressing([name]) with water repellant cover (for example, large plastic bag) or a cast cover and may then take shower. Edema Control - Lymphedema / SCD / Other: Elevate legs to the level of the heart or above for 30 minutes daily and/or when sitting for 3-4 times a day throughout the day. Avoid standing for long periods of time. Off-Loading: Open toe surgical shoe to: - with offloading Peg Assist to aid in offloading pressure to left plantar foot. WOUND #1: - T Great Wound Laterality: Left oe Cleanser: Wound Cleanser 1 x Per Day/30 Days Discharge Instructions: Cleanse the wound with wound cleanser prior to applying a clean dressing using gauze sponges, not tissue or cotton balls. Cleanser: Byram Ancillary Kit - 15 Day Supply (Generic) 1 x Per Day/30 Days Discharge Instructions: Use supplies as instructed; Kit contains: (15) Saline Bullets; (15) 3x3 Gauze; 15 pr Gloves Prim Dressing: Hydrofera Blue Ready Transfer Foam, 2.5x2.5 (in/in) (Generic) 1 x Per Day/30 Days ary Discharge Instructions: Apply directly to wound bed as directed Prim Dressing: MediHoney Gel, tube 1.5 (oz) 1 x Per Day/30 Days ary Discharge Instructions: Apply to wound bed as instructed Secondary Dressing: Optifoam Non-Adhesive Dressing, 4x4 in (Generic) 1 x Per Day/30 Days Discharge Instructions: Apply x1 layer donut to aid in offloading. Secondary Dressing: Woven Gauze Sponges 2x2 in (Generic) 1 x Per Day/30 Days Discharge Instructions: Apply over primary dressing as directed. Secured With: Child psychotherapist,  Sterile 2x75 (in/in) (Generic) 1 x Per Day/30 Days Discharge Instructions: Secure with stretch gauze as directed. Secured With: 37M Medipore H Soft Cloth Surgical T ape, 4 x 10 (in/yd) (Generic) 1 x Per Day/30 Days Discharge Instructions: Secure with tape as directed. WOUND #2: - Foot Wound Laterality: Plantar, Left Cleanser: Wound Cleanser 1 x Per Day/30 Days Discharge Instructions: Cleanse the wound with wound cleanser prior to applying a clean dressing using gauze sponges, not tissue or cotton balls. Cleanser: Byram Ancillary Kit - 15 Day Supply (Generic) 1 x Per Day/30 Days [name], [name] ([id number]) [id number] Page 7 of 8 Discharge Instructions: Use supplies as instructed; Kit contains: (15) Saline Bullets; (15) 3x3 Gauze; 15 pr Gloves Prim Dressing: Hydrofera Blue Ready Transfer Foam, 2.5x2.5 (in/in) (Generic) 1 x Per Day/30 Days ary Discharge Instructions: Apply directly to wound bed as directed Prim Dressing: MediHoney Gel, tube 1.5 (oz) 1 x Per Day/30 Days ary Discharge Instructions: Apply to wound bed as instructed Secondary Dressing: Optifoam Non-Adhesive Dressing, 4x4 in (Generic) 1 x Per Day/30 Days Discharge Instructions: Apply x1 layer  donut to aid in offloading. Secondary Dressing: Woven Gauze Sponges 2x2 in (Generic) 1 x Per Day/30 Days Discharge Instructions: Apply over primary dressing as directed. Secured With: Child psychotherapist, Sterile 2x75 (in/in) (Generic) 1 x Per Day/30 Days Discharge Instructions: Secure with stretch gauze as directed. Secured With: 49M Medipore H Soft Cloth Surgical T ape, 4 x 10 (in/yd) (Generic) 1 x Per Day/30 Days Discharge Instructions: Secure with tape as directed. 1. In office sharp debridement 2. Medihoney and Hydrofera Blue 3. Aggressive offloadingoosurgical shoe with peg assist 4. Follow-up in 1 week for total contact cast placement Electronic Signature([name]) Signed: 02/18/2022 4:21:39 PM By:  Kalman Shan DO Entered By: Kalman Shan on 02/18/2022 13:42:42 -------------------------------------------------------------------------------- HxROS Details Patient Name: Date of Service: [name]. 02/18/2022 12:30 PM Medical Record Number: [id number] Patient Account Number: [id number] Date of Birth/Sex: Treating RN: 09-26-1965 (57 y.o. M) Primary Care Provider: [name] Other Clinician: Referring Provider: Treating Provider/Extender: [name] in Treatment: 1 Hematologic/Lymphatic Medical History: Positive for: Lymphedema - pumps use daily Respiratory Medical History: Positive for: Sleep Apnea - CPAP Cardiovascular Medical History: Positive for: Hypertension; Peripheral Venous Disease - needs ablation on left leg Musculoskeletal Medical History: Positive for: Osteoarthritis Negative for: Osteomyelitis Past Medical History Notes: abscess to left foot 3rd toe 11/28/2022 Neurologic Medical History: Positive for: Neuropathy Immunizations Implantable Devices No devices added Hospitalization / Surgery History EDOARDO, LAFORTE ([id number]) [id number] Page 8 of 8 Type of Hospitalization/Surgery 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 Family and Social History Heart Disease: Yes - Mother; Lung Disease: Yes - Mother; Current every day smoker - 10 cig a day; Alcohol Use: Never; Drug Use: No History; Caffeine Use: Never; Financial Concerns: No; Food, Clothing or Shelter Needs: No; Support System Lacking: No; Transportation Concerns: No Electronic Signature([name]) Signed: 02/18/2022 4:21:39 PM By: Kalman Shan DO Entered By: Kalman Shan on 02/18/2022 13:39:12 -------------------------------------------------------------------------------- SuperBill Details Patient Name: Date of Service: [name] 02/18/2022 Medical Record Number: [id number] Patient Account Number:  [id number] Date of Birth/Sex: Treating RN: [birth year] (57 y.o. [name], [name] Primary Care Provider: [name] Other Clinician: Referring Provider: Treating Provider/Extender: [name] in Treatment: 1 Diagnosis Coding ICD-10 Codes Code Description [phone] 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 G47.33 Obstructive sleep apnea (adult) (pediatric) I87.2 Venous insufficiency (chronic) (peripheral) Facility Procedures : CPT4 Code: [id number] Description: [place] VISIT-LEV 3 EST PT Modifier: Quantity: 1 : CPT4 Code: [id number] Description: 77412 - DEB SUBQ TISSUE 20 SQ CM/< ICD-10 Diagnosis Description L97.522 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 Modifier: Quantity: 1 Physician Procedures : CPT4 Code Description Modifier [id number] - WC PHYS SUBQ TISS 20 SQ CM ICD-10 Diagnosis Description L97.522 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 Quantity: 1 Electronic Signature([name]) Signed: 02/18/2022 4:21:39 PM By: Kalman Shan DO Entered By: Kalman Shan on 02/18/2022 13:42:51

## 2022-02-24 ENCOUNTER — Ambulatory Visit: Payer: BC Managed Care – PPO | Admitting: Physician Assistant

## 2022-02-25 ENCOUNTER — Encounter (HOSPITAL_BASED_OUTPATIENT_CLINIC_OR_DEPARTMENT_OTHER): Payer: BC Managed Care – PPO | Admitting: Internal Medicine

## 2022-02-25 DIAGNOSIS — I872 Venous insufficiency (chronic) (peripheral): Secondary | ICD-10-CM | POA: Diagnosis not present

## 2022-02-25 DIAGNOSIS — F17200 Nicotine dependence, unspecified, uncomplicated: Secondary | ICD-10-CM | POA: Diagnosis not present

## 2022-02-25 DIAGNOSIS — G4733 Obstructive sleep apnea (adult) (pediatric): Secondary | ICD-10-CM | POA: Diagnosis not present

## 2022-02-25 DIAGNOSIS — L97528 Non-pressure chronic ulcer of other part of left foot with other specified severity: Secondary | ICD-10-CM | POA: Diagnosis not present

## 2022-02-25 DIAGNOSIS — G629 Polyneuropathy, unspecified: Secondary | ICD-10-CM | POA: Diagnosis not present

## 2022-02-25 DIAGNOSIS — L97522 Non-pressure chronic ulcer of other part of 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: Secondary | ICD-10-CM

## 2022-02-25 DIAGNOSIS — G9009 Other idiopathic peripheral autonomic neuropathy: Secondary | ICD-10-CM | POA: Diagnosis not present

## 2022-02-25 DIAGNOSIS — I10 Essential (primary) hypertension: Secondary | ICD-10-CM | POA: Diagnosis not present

## 2022-02-25 DIAGNOSIS — L02612 Cutaneous abscess of left foot: Secondary | ICD-10-CM | POA: Diagnosis not present

## 2022-02-26 DIAGNOSIS — R600 Localized edema: Secondary | ICD-10-CM | POA: Diagnosis not present

## 2022-02-26 DIAGNOSIS — I10 Essential (primary) hypertension: Secondary | ICD-10-CM | POA: Diagnosis not present

## 2022-02-26 DIAGNOSIS — M549 Dorsalgia, unspecified: Secondary | ICD-10-CM | POA: Diagnosis not present

## 2022-02-26 DIAGNOSIS — F1120 Opioid dependence, uncomplicated: Secondary | ICD-10-CM | POA: Diagnosis not present

## 2022-02-26 NOTE — Progress Notes (Signed)
[name], [name] ([id number]) [id number] Page 1 of 9 Visit Report for 02/25/2022 Chief Complaint Document Details Patient Name: Date of Service: [name], [name] 02/25/2022 1:15 PM Medical Record Number: [id number] Patient Account Number: [id number] Date of Birth/Sex: Treating RN: 1965-11-09 (57 y.o. M) Primary Care Provider: [name] Other Clinician: Referring Provider: Treating Provider/Extender: [name] in Treatment: 2 Information Obtained from: Patient Chief Complaint 02/11/2022 left great toe wound and fourth sub-met head wound Electronic Signature([name]) Signed: 02/25/2022 3:38:55 PM By: Kalman Shan DO Entered By: Kalman Shan on 02/25/2022 14:05:57 -------------------------------------------------------------------------------- Debridement Details Patient Name: Date of Service: [name], [name] 02/25/2022 1:15 PM Medical Record Number: [id number] Patient Account Number: [id number] Date of Birth/Sex: Treating RN: Feb 25, 1965 (57 y.o. [name], [name] Primary Care Provider: [name] Other Clinician: Referring Provider: Treating Provider/Extender: [name]: 2 Debridement Performed for Assessment: Wound #2 Left,Plantar Foot Performed By: Physician Kalman Shan, DO Debridement Type: Debridement Level of Consciousness (Pre-procedure): Awake and Alert Pre-procedure Verification/Time Out Yes - 13:55 Taken: Start Time: 13:55 Pain Control: Lidocaine T Area Debrided (L x W): otal 0.6 (cm) x 0.3 (cm) = 0.18 (cm) Tissue and other material debrided: Viable, Non-Viable, Slough, Subcutaneous, Slough Level: Skin/Subcutaneous Tissue Debridement Description: Excisional Instrument: Curette Bleeding: Minimum Hemostasis Achieved: Pressure End Time: 13:55 Procedural Pain: 0 Post Procedural Pain: 0 Response to Treatment: Procedure was tolerated  well Level of Consciousness (Post- Awake and Alert procedure): Post Debridement Measurements of Total Wound Length: (cm) 0.6 Width: (cm) 0.3 Depth: (cm) 0.9 Volume: (cm) 0.127 Character of Wound/Ulcer Post Debridement: Improved Post Procedure Diagnosis [name], [name] ([id number]) [id number] Page 2 of 9 Same as Pre-procedure Electronic Signature([name]) Signed: 02/25/2022 3:38:55 PM By: Kalman Shan DO Signed: 02/26/2022 8:39:24 AM By: Rhae Hammock RN Entered By: Rhae Hammock on 02/25/2022 13:58:28 -------------------------------------------------------------------------------- HPI Details Patient Name: Date of Service: [name], [name] 02/25/2022 1:15 PM Medical Record Number: [id number] Patient Account Number: [id number] Date of Birth/Sex: Treating RN: Jun 03, 1965 (56 y.o. M) Primary Care Provider: [name] Other Clinician: Referring Provider: Treating Provider/Extender: [name] in Treatment: 2 History of Present Illness HPI Description: 02/11/2022 [name] is a 58 year old male with a past medical history of current tobacco user, OSA, venous insufficiency and peripheral neuropathy that presents the clinic for a 2-week history of ulcer to the left great toe and 2-78-monthhistory of nonhealing ulcer to the left plantar foot. [place] all day. He wears steel toed boots. He is currently out of work due to his wounds. On 11/26/2021 he was admitted to the hospital for cellulitis and abscess to the left foot. 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 He states he has been on doxycycline for 10 weeks. He just completed this course. He uses regular tennis shoes for walking. He is not offloading the area. He is using Betadine for wound dressings. He has been following with Dr. PPosey Pronto podiatry who Referred to our clinic. 1/23;  patient presents for follow-up. He has been using Medihoney and Hydrofera Blue to the wound beds Along with his surgical shoe and offloading peg assist. He has no issues or complaints today. He denies signs of infection. 1/30; patient presents for follow-up. He has been using Medihoney and Hydrofera Blue to the wound beds. He has no issues or complaints today. Plan is for the total contact cast and patient is agreeable to move forward with this.  Electronic Signature([name]) Signed: 02/25/2022 3:38:55 PM By: Kalman Shan DO Entered By: Kalman Shan on 02/25/2022 14:06:36 -------------------------------------------------------------------------------- Physical Exam Details Patient Name: Date of Service: [name], [name] 02/25/2022 1:15 PM Medical Record Number: [id number] Patient Account Number: [id number] Date of Birth/Sex: Treating RN: 1965/04/19 (57 y.o. M) Primary Care Provider: [name] Other Clinician: Referring Provider: Treating Provider/Extender: [name]: 2 Constitutional respirations regular, non-labored and within target range for patient.. Cardiovascular 2+ dorsalis pedis/posterior tibialis pulses. Psychiatric pleasant and cooperative. Notes Left foot: T the plantar aspect of the great toe there is [name] open wound with granulation tissue and tightly adhered fibrinous tissue. T the plantar aspect of the o o foot there is [name] open wound with nonviable tissue and granulation tissue. No signs of surrounding soft tissue infection to any of the wound beds. [name], [name] ([id number]) [id number] Page 3 of 9 Electronic Signature([name]) Signed: 02/25/2022 3:38:55 PM By: Kalman Shan DO Entered By: Kalman Shan on 02/25/2022 14:07:00 -------------------------------------------------------------------------------- Physician Orders Details Patient Name: Date of Service: [name], [name]  02/25/2022 1:15 PM Medical Record Number: [id number] Patient Account Number: [id number] Date of Birth/Sex: Treating RN: 1965/01/30 (57 y.o. [name], [name] Primary Care Provider: [name] Other Clinician: Referring Provider: Treating Provider/Extender: [name] in Treatment: 2 Verbal / Phone Orders: No Diagnosis Coding Follow-up Appointments ppointment in 1 week. - Dr. Heber [place] Thursday 02/02 @ 2:30 Rm # 9 ***TCC *** FIRST CAST CHANGE*** Return A Dr. Heber [place] Thursday 03/01/22 @ 9:00 Rm # 7 ***TCC*** Anesthetic (In clinic) Topical Lidocaine 4% applied to wound bed Bathing/ Shower/ Hygiene May shower with protection but do not get wound dressing([name]) wet. Protect dressing([name]) with water repellant cover (for example, large plastic bag) or a cast cover and may then take shower. Edema Control - Lymphedema / SCD / Other Elevate legs to the level of the heart or above for 30 minutes daily and/or when sitting for 3-4 times a day throughout the day. Avoid standing for long periods of time. Off-Loading Open toe surgical shoe to: - with offloading Peg Assist to aid in offloading pressure to left plantar foot. Wound Treatment Wound #1 - T Great oe Wound Laterality: Left Cleanser: Wound Cleanser 1 x Per Day/30 Days Discharge Instructions: Cleanse the wound with wound cleanser prior to applying a clean dressing using gauze sponges, not tissue or cotton balls. Cleanser: Byram Ancillary Kit - 15 Day Supply (Generic) 1 x Per Day/30 Days Discharge Instructions: Use supplies as instructed; Kit contains: (15) Saline Bullets; (15) 3x3 Gauze; 15 pr Gloves Topical: Gentamicin 1 x Per Day/30 Days Discharge Instructions: As directed by physician Topical: Mupirocin Ointment 1 x Per Day/30 Days Discharge Instructions: Apply Mupirocin (Bactroban) as instructed Prim Dressing: Hydrofera Blue Ready Transfer Foam, 2.5x2.5 (in/in) (Generic) 1 x Per Day/30  Days ary Discharge Instructions: Apply directly to wound bed as directed Secondary Dressing: Optifoam Non-Adhesive Dressing, 4x4 in (Generic) 1 x Per Day/30 Days Discharge Instructions: Apply x1 layer donut to aid in offloading. Secondary Dressing: Woven Gauze Sponges 2x2 in (Generic) 1 x Per Day/30 Days Discharge Instructions: Apply over primary dressing as directed. Secured With: Child psychotherapist, Sterile 2x75 (in/in) (Generic) 1 x Per Day/30 Days Discharge Instructions: Secure with stretch gauze as directed. Secured With: 50M Medipore H Soft Cloth Surgical T ape, 4 x 10 (in/yd) (Generic) 1 x Per Day/30 Days Discharge Instructions: Secure with tape as directed. Wound #2 - Foot Wound Laterality: Plantar, Left Cleanser: Wound Cleanser  1 x Per Day/30 Days Discharge Instructions: Cleanse the wound with wound cleanser prior to applying a clean dressing using gauze sponges, not tissue or cotton balls. Cleanser: Byram Ancillary Kit - 15 Day Supply (Generic) 1 x Per Day/30 Days BRYLIN, STANISLAWSKI [name] ([id number]) [id number] Page 4 of 9 Discharge Instructions: Use supplies as instructed; Kit contains: (15) Saline Bullets; (15) 3x3 Gauze; 15 pr Gloves Topical: Gentamicin 1 x Per Day/30 Days Discharge Instructions: As directed by physician Topical: Mupirocin Ointment 1 x Per Day/30 Days Discharge Instructions: Apply Mupirocin (Bactroban) as instructed Prim Dressing: Hydrofera Blue Ready Transfer Foam, 2.5x2.5 (in/in) (Generic) 1 x Per Day/30 Days ary Discharge Instructions: Apply directly to wound bed as directed Secondary Dressing: Optifoam Non-Adhesive Dressing, 4x4 in (Generic) 1 x Per Day/30 Days Discharge Instructions: Apply x1 layer donut to aid in offloading. Secondary Dressing: Woven Gauze Sponges 2x2 in (Generic) 1 x Per Day/30 Days Discharge Instructions: Apply over primary dressing as directed. Secured With: Child psychotherapist,  Sterile 2x75 (in/in) (Generic) 1 x Per Day/30 Days Discharge Instructions: Secure with stretch gauze as directed. Secured With: 47M Medipore H Soft Cloth Surgical T ape, 4 x 10 (in/yd) (Generic) 1 x Per Day/30 Days Discharge Instructions: Secure with tape as directed. Electronic Signature([name]) Signed: 02/25/2022 3:38:55 PM By: Kalman Shan DO Entered By: Kalman Shan on 02/25/2022 14:07:08 -------------------------------------------------------------------------------- Problem List Details Patient Name: Date of Service: [name], MCLEAN 02/25/2022 1:15 PM Medical Record Number: [id number] Patient Account Number: [id number] Date of Birth/Sex: Treating RN: March 16, 1965 (57 y.o. M) Primary Care Provider: [name] Other Clinician: Referring Provider: Treating Provider/Extender: [name] in Treatment: 2 Active Problems ICD-10 Encounter Code Description Active Date MDM Diagnosis [phone] Non-pressure chronic ulcer of other part of 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 02/11/2022 No Yes G90.09 Other idiopathic peripheral autonomic neuropathy 02/11/2022 No Yes G47.33 Obstructive sleep apnea (adult) (pediatric) 02/11/2022 No Yes I87.2 Venous insufficiency (chronic) (peripheral) 02/11/2022 No Yes Inactive Problems Resolved Problems Electronic Signature([name]) Signed: 02/25/2022 3:38:55 PM By: Marga Melnick, Amerigo [name] ([id number]) By: Kalman Shan DO [id number] Page 5 of 9 Signed: 02/25/2022 3:38:55 PM Entered By: Kalman Shan on 02/25/2022 14:05:41 -------------------------------------------------------------------------------- Progress Note Details Patient Name: Date of Service: HAJI, DELAINE 02/25/2022 1:15 PM Medical Record Number: [id number] Patient Account Number: [id number] Date of Birth/Sex: Treating RN: 23-May-1965 (57 y.o. M) Primary Care Provider: [name] Other Clinician: Referring  Provider: Treating Provider/Extender: [name] in Treatment: 2 Subjective Chief Complaint Information obtained from Patient 02/11/2022 left great toe wound and fourth sub-met head wound History of Present Illness (HPI) 02/11/2022 Mr. Deagan Sevin is a 57 year old male with a past medical history of current tobacco user, OSA, venous insufficiency and peripheral neuropathy that presents the clinic for a 2-week history of ulcer to the left great toe and 2-2-monthhistory of nonhealing ulcer to the left plantar foot. [place] in a [place] all day. He wears steel toed boots. He is currently out of work due to his wounds. On 11/26/2021 he was admitted to the hospital for cellulitis and abscess to the left foot. 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 He states he has been on doxycycline for 10 weeks. He just completed this course. He uses regular tennis shoes for walking. He is not offloading the area. He is using Betadine for wound dressings. He has been following with Dr. PPosey Pronto podiatry who Referred to our clinic. 1/23;  patient presents for follow-up. He has been using Medihoney and Hydrofera Blue to the wound beds Along with his surgical shoe and offloading peg assist. He has no issues or complaints today. He denies signs of infection. 1/30; patient presents for follow-up. He has been using Medihoney and Hydrofera Blue to the wound beds. He has no issues or complaints today. Plan is for the total contact cast and patient is agreeable to move forward with this. Patient History Family History Heart Disease - Mother, Lung Disease - Mother. Social History Current every day smoker - 10 cig a day, Alcohol Use - Never, Drug Use - No History, Caffeine Use - Never. Medical History Hematologic/Lymphatic Patient has history of Lymphedema - pumps use daily Respiratory Patient has history of Sleep Apnea -  CPAP Cardiovascular Patient has history of Hypertension, Peripheral Venous Disease - needs ablation on left leg Musculoskeletal Patient has history of Osteoarthritis Denies history of Osteomyelitis Neurologic Patient has history of Neuropathy Hospitalization/Surgery History - 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. Medical A Surgical History Notes nd Musculoskeletal abscess to left foot 3rd toe 11/28/2022 Objective Constitutional respirations regular, non-labored and within target range for patient.. Vitals Time Taken: 1:39 PM, Height: 75 in, Weight: 220 lbs, BMI: 27.5, Temperature: 99.1 F, Pulse: 74 bpm, Respiratory Rate: 17 breaths/min, Blood Pressure: 145/74 mmHg. [name], [name] ([id number]) [id number] Page 6 of 9 Cardiovascular 2+ dorsalis pedis/posterior tibialis pulses. Psychiatric pleasant and cooperative. General Notes: Left foot: T the plantar aspect of the great toe there is [name] open wound with granulation tissue and tightly adhered fibrinous tissue. T the o o plantar aspect of the foot there is [name] open wound with nonviable tissue and granulation tissue. No signs of surrounding soft tissue infection to any of the wound beds. Integumentary (Hair, Skin) Wound #1 status is Open. Original cause of wound was Shear/Friction. The date acquired was: 01/28/2022. The wound has been in treatment 2 weeks. The wound is located on the Left T Great. The wound measures 0.7cm length x 0.8cm width x 0.1cm depth; 0.44cm^2 area and 0.044cm^3 volume. There is Fat Layer oe (Subcutaneous Tissue) exposed. There is no tunneling or undermining noted. There is a medium amount of serosanguineous drainage noted. The wound margin is distinct with the outline attached to the wound base. There is large (67-100%) red, pink, hyper - granulation within the wound bed. There is no necrotic tissue within the wound bed. The periwound skin appearance exhibited: Callus. The  periwound skin appearance did not exhibit: Crepitus, Excoriation, Induration, Rash, Scarring, Dry/Scaly, Maceration, Atrophie Blanche, Cyanosis, Ecchymosis, Hemosiderin Staining, Mottled, Pallor, Rubor, Erythema. Wound #2 status is Open. Original cause of wound was Surgical Injury. The date acquired was: 01/14/2022. The wound has been in treatment 2 weeks. The wound is located on the [place]. The wound measures 0.6cm length x 0.3cm width x 0.9cm depth; 0.141cm^2 area and 0.127cm^3 volume. There is Fat Layer (Subcutaneous Tissue) exposed. There is no tunneling noted, however, there is undermining starting at 12:00 and ending at 12:00 with a maximum distance of 0.4cm. There is a medium amount of serosanguineous drainage noted. The wound margin is distinct with the outline attached to the wound base. There is small (1-33%) pink granulation within the wound bed. There is a large (67-100%) amount of necrotic tissue within the wound bed including Eschar and Adherent Slough. The periwound skin appearance exhibited: Callus. The periwound skin appearance did not exhibit: Crepitus, Excoriation, Induration, Rash, Scarring, Dry/Scaly, Maceration, Atrophie Blanche, Cyanosis, Ecchymosis, Hemosiderin  Staining, Mottled, Pallor, Rubor, Erythema. 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 I debrided nonviable tissue. Plan is for the total contact cast. He knows to not get this wet and he needs to keep the block boot on 24/7. For the dressing I recommended antibiotic ointment and Hydrofera Blue. He will follow-up in later in the week for obligatory cast change Procedures Wound #2 Pre-procedure diagnosis of Wound #2 is [name] Open Surgical Wound located on the [place] .  There was a Excisional Skin/Subcutaneous Tissue Debridement with a total area of 0.18 sq cm performed by Kalman Shan, DO. With the following instrument([name]): Curette to remove Viable and Non-Viable tissue/material. Material removed includes Subcutaneous Tissue and Slough and after achieving pain control using Lidocaine. No specimens were taken. A time out was conducted at 13:55, prior to the start of the procedure. A Minimum amount of bleeding was controlled with Pressure. The procedure was tolerated well with a pain level of 0 throughout and a pain level of 0 following the procedure. Post Debridement Measurements: 0.6cm length x 0.3cm width x 0.9cm depth; 0.127cm^3 volume. Character of Wound/Ulcer Post Debridement is improved. Post procedure Diagnosis Wound #2: Same as Pre-Procedure Pre-procedure diagnosis of Wound #2 is [name] Open Surgical Wound located on the Left,Plantar Foot . There was a T Programmer, multimedia Procedure by Enzo Montgomery, DO. Post procedure Diagnosis Wound #2: Same as Pre-Procedure Wound #1 Pre-procedure diagnosis of Wound #1 is a Neuropathic Ulcer-Non Diabetic located on the Left T Great . There was a T Programmer, multimedia Procedure by Bjorn Pippin, Maddeline Roorda, DO. Post procedure Diagnosis Wound #1: Same as Pre-Procedure Plan Follow-up Appointments: Return Appointment in 1 week. - Dr. Heber [place] Thursday 02/02 @ 2:30 Rm # 9 ***TCC *** FIRST CAST CHANGE*** Dr. Heber [place] Thursday 03/01/22 @ 9:00 Rm # 7 ***TCC*** Anesthetic: (In clinic) Topical Lidocaine 4% applied to wound bed Bathing/ Shower/ Hygiene: May shower with protection but do not get wound dressing([name]) wet. Protect dressing([name]) with water repellant cover (for example, large plastic bag) or a cast cover JABEN, BENEGAS [name] ([id number]) [id number] Page 7 of 9 and may then take shower. Edema Control - Lymphedema / SCD / Other: Elevate legs to the level of the heart or above for 30 minutes daily  and/or when sitting for 3-4 times a day throughout the day. Avoid standing for long periods of time. Off-Loading: Open toe surgical shoe to: - with offloading Peg Assist to aid in offloading pressure to left plantar foot. WOUND #1: - T Great Wound Laterality: Left oe Cleanser: Wound Cleanser 1 x Per Day/30 Days Discharge Instructions: Cleanse the wound with wound cleanser prior to applying a clean dressing using gauze sponges, not tissue or cotton balls. Cleanser: Byram Ancillary Kit - 15 Day Supply (Generic) 1 x Per Day/30 Days Discharge Instructions: Use supplies as instructed; Kit contains: (15) Saline Bullets; (15) 3x3 Gauze; 15 pr Gloves Topical: Gentamicin 1 x Per Day/30 Days Discharge Instructions: As directed by physician Topical: Mupirocin Ointment 1 x Per Day/30 Days Discharge Instructions: Apply Mupirocin (Bactroban) as instructed Prim Dressing: Hydrofera Blue Ready Transfer Foam, 2.5x2.5 (in/in) (Generic) 1 x Per Day/30 Days ary Discharge Instructions: Apply directly to wound bed as directed Secondary Dressing: Optifoam Non-Adhesive Dressing, 4x4 in (Generic) 1 x Per Day/30 Days Discharge Instructions: Apply x1 layer donut to aid in offloading.  Secondary Dressing: Woven Gauze Sponges 2x2 in (Generic) 1 x Per Day/30 Days Discharge Instructions: Apply over primary dressing as directed. Secured With: Child psychotherapist, Sterile 2x75 (in/in) (Generic) 1 x Per Day/30 Days Discharge Instructions: Secure with stretch gauze as directed. Secured With: 39M Medipore H Soft Cloth Surgical T ape, 4 x 10 (in/yd) (Generic) 1 x Per Day/30 Days Discharge Instructions: Secure with tape as directed. WOUND #2: - Foot Wound Laterality: Plantar, Left Cleanser: Wound Cleanser 1 x Per Day/30 Days Discharge Instructions: Cleanse the wound with wound cleanser prior to applying a clean dressing using gauze sponges, not tissue or cotton balls. Cleanser: Byram Ancillary Kit - 15 Day Supply  (Generic) 1 x Per Day/30 Days Discharge Instructions: Use supplies as instructed; Kit contains: (15) Saline Bullets; (15) 3x3 Gauze; 15 pr Gloves Topical: Gentamicin 1 x Per Day/30 Days Discharge Instructions: As directed by physician Topical: Mupirocin Ointment 1 x Per Day/30 Days Discharge Instructions: Apply Mupirocin (Bactroban) as instructed Prim Dressing: Hydrofera Blue Ready Transfer Foam, 2.5x2.5 (in/in) (Generic) 1 x Per Day/30 Days ary Discharge Instructions: Apply directly to wound bed as directed Secondary Dressing: Optifoam Non-Adhesive Dressing, 4x4 in (Generic) 1 x Per Day/30 Days Discharge Instructions: Apply x1 layer donut to aid in offloading. Secondary Dressing: Woven Gauze Sponges 2x2 in (Generic) 1 x Per Day/30 Days Discharge Instructions: Apply over primary dressing as directed. Secured With: Child psychotherapist, Sterile 2x75 (in/in) (Generic) 1 x Per Day/30 Days Discharge Instructions: Secure with stretch gauze as directed. Secured With: 39M Medipore H Soft Cloth Surgical T ape, 4 x 10 (in/yd) (Generic) 1 x Per Day/30 Days Discharge Instructions: Secure with tape as directed. 1. In office sharp debridement 2. T contact cast placed in standard fashion otal 3. Hydrofera Blue and antibiotic ointment 4. Follow-up later in the week for obligatory cast change Electronic Signature([name]) Signed: 02/25/2022 3:38:55 PM By: Kalman Shan DO Entered By: Kalman Shan on 02/25/2022 14:08:20 -------------------------------------------------------------------------------- HxROS Details Patient Name: Date of Service: CHIRON, CAMPIONE 02/25/2022 1:15 PM Medical Record Number: [id number] Patient Account Number: [id number] Date of Birth/Sex: Treating RN: 1965-04-29 (57 y.o. M) Primary Care Provider: [name] Other Clinician: Referring Provider: Treating Provider/Extender: [name] in Treatment:  2 Hematologic/Lymphatic Medical History: Positive for: Lymphedema - pumps use daily Respiratory Medical History: Positive for: Sleep Apnea - CPAP Rhue, Eleno [name] ([id number]) [id number] Page 8 of 9 Cardiovascular Medical History: Positive for: Hypertension; Peripheral Venous Disease - needs ablation on left leg Musculoskeletal Medical History: Positive for: Osteoarthritis Negative for: Osteomyelitis Past Medical History Notes: abscess to left foot 3rd toe 11/28/2022 Neurologic Medical History: Positive for: Neuropathy Immunizations Implantable Devices No devices added Hospitalization / Surgery History Type of Hospitalization/Surgery 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 Family and Social History Heart Disease: Yes - Mother; Lung Disease: Yes - Mother; Current every day smoker - 10 cig a day; Alcohol Use: Never; Drug Use: No History; Caffeine Use: Never; Financial Concerns: No; Food, Clothing or Shelter Needs: No; Support System Lacking: No; Transportation Concerns: No Electronic Signature([name]) Signed: 02/25/2022 3:38:55 PM By: Kalman Shan DO Entered By: Kalman Shan on 02/25/2022 14:06:42 -------------------------------------------------------------------------------- Total Contact Cast Details Patient Name: Date of Service: CANDELARIO, STEPPE 02/25/2022 1:15 PM Medical Record Number: [id number] Patient Account Number: [id number] Date of Birth/Sex: Treating RN: 07/29/1965 (57 y.o. [name], [name] Primary Care Provider: [name] Other Clinician: Referring Provider: Treating Provider/Extender: [name]: 2 T Contact Cast Applied for  Wound Assessment: otal Wound #1 Left T Great oe Performed By: Physician Kalman Shan, DO Post Procedure Diagnosis Same as Pre-procedure Electronic Signature([name]) Signed: 02/25/2022 3:38:55 PM By: Kalman Shan DO Signed: 02/26/2022  8:39:24 AM By: Rhae Hammock RN Entered By: Rhae Hammock on 02/25/2022 14:02:59 -------------------------------------------------------------------------------- Total Contact Cast Details Patient Name: Date of Service: DENALI, BECVAR 02/25/2022 1:15 PM [name] ([id number]) [id number] Page 9 of 9 Medical Record Number: [id number] Patient Account Number: [id number] Date of Birth/Sex: Treating RN: 1965/03/11 (57 y.o. [name], [name] Primary Care Provider: [name] Other Clinician: Referring Provider: Treating Provider/Extender: [name] in Treatment: 2 T Contact Cast Applied for Wound Assessment: otal Wound #2 Left,Plantar Foot Performed By: Physician Kalman Shan, DO Post Procedure Diagnosis Same as Pre-procedure Electronic Signature([name]) Signed: 02/25/2022 3:38:55 PM By: Kalman Shan DO Signed: 02/26/2022 8:39:24 AM By: Rhae Hammock RN Entered By: Rhae Hammock on 02/25/2022 14:02:59 -------------------------------------------------------------------------------- SuperBill Details Patient Name: Date of Service: ALEXEI, DOSWELL 02/25/2022 Medical Record Number: [id number] Patient Account Number: [id number] Date of Birth/Sex: Treating RN: 09/19/1965 (57 y.o. 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 G47.33 Obstructive sleep apnea (adult) (pediatric) I87.2 Venous insufficiency (chronic) (peripheral) Facility Procedures : CPT4 Code: [id number] Description: [employer] TISSUE 20 SQ CM/< ICD-10 Diagnosis Description L97.522 Non-pressure  chronic ulcer of other part of 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 Modifier: Quantity: 1 : CPT4 Code: [id number] Description: 83291 - APPLY TOTAL CONTACT LEG CAST ICD-10 Diagnosis Description L97.522 Non-pressure chronic ulcer of other part of 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 Modifier: 59 Quantity: 1 Physician Procedures : CPT4 Code Description Modifier [id number] 11042 - WC PHYS SUBQ TISS 20 SQ CM ICD-10 Diagnosis Description L97.522 Non-pressure chronic ulcer of other part of 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 Quantity: 1 : [id number] 41423 - WC PHYS APPLY TOTAL CONTACT CAST 61 ICD-10 Diagnosis Description L97.522 Non-pressure chronic ulcer of other part of 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 Quantity: 1 Electronic Signature([name]) Signed: 02/25/2022 3:38:55 PM By: Kalman Shan DO Entered By: Kalman Shan on 02/25/2022 14:08:56

## 2022-02-26 NOTE — Progress Notes (Signed)
John, [name] ([id number]) [id number] Page 1 of 10 Visit Report for 02/25/2022 Arrival Information Details Patient Name: Date of Service: [name], [name] 02/25/2022 1:15 PM Medical Record Number: [id number] Patient Account Number: [id number] Date of Birth/Sex: Treating RN: December 08, 1965 (57 y.o. [name], [name] Primary Care [name]: [name] Other Clinician: Referring [name]: Treating [name]/Extender: [name]: 2 Visit Information History Since Last Visit Added or deleted any medications: No Patient Arrived: Ambulatory Any new allergies or adverse reactions: No Arrival Time: 13:38 Had a fall or experienced change in No Accompanied By: self activities of daily living that may affect Transfer Assistance: None risk of falls: Patient Identification Verified: Yes Signs or symptoms of abuse/neglect since last visito No Secondary Verification Process Completed: Yes Hospitalized since last visit: No Patient Requires Transmission-Based No Implantable device outside of the clinic excluding No Precautions: cellular tissue based products placed in the center Patient Has Alerts: Yes since last visit: Patient Alerts: 11/27/21 ABI L 1.26 R1.09 Has Dressing in Place as Prescribed: Yes 11/27/2021 TBI L0.72 Pain Present Now: No R0.66 Electronic Signature([name]) Signed: 02/26/2022 8:39:24 AM By: Rhae Hammock RN Entered By: Rhae Hammock on 02/25/2022 13:38:57 -------------------------------------------------------------------------------- Encounter Discharge Information Details Patient Name: Date of Service: [name], [name] 02/25/2022 1:15 PM Medical Record Number: [id number] Patient Account Number: [id number] Date of Birth/Sex: Treating RN: 04/16/1965 (57 y.o. [name], [name] Primary Care Ishmail Mcmanamon: [name] Other Clinician: Referring [name]: Treating Ambria Mayfield/Extender:  [name]: 2 Encounter Discharge Information Items Post Procedure Vitals Discharge Condition: Stable Temperature (F): 98.7 Ambulatory Status: Ambulatory Pulse (bpm): 74 Discharge Destination: Home Respiratory Rate (breaths/min): 17 Transportation: Private Auto Blood Pressure (mmHg): 120/80 Accompanied By: self Schedule Follow-up Appointment: Yes Clinical Summary of Care: Patient Declined Electronic Signature([name]) Signed: 02/26/2022 8:39:24 AM By: Rhae Hammock RN Entered By: Rhae Hammock on 02/25/2022 14:04:27 John, [name] ([id number]) [id number] Page 2 of 10 -------------------------------------------------------------------------------- Lower Extremity Assessment Details Patient Name: Date of Service: [name], [name] 02/25/2022 1:15 PM Medical Record Number: [id number] Patient Account Number: [id number] Date of Birth/Sex: Treating RN: 09-11-1965 (57 y.o. [name], [name] Primary Care Timberlyn Pickford: [name] Other Clinician: Referring Christohper Dube: Treating Fahim Kats/Extender: [name] in [name]: 2 Edema Assessment Assessed: [name]: Yes] [name]: No] Edema: [Left: Ye] [Right: [name]] Calf Left: Right: Point of Measurement: 43 cm From Medial Instep 40 cm Ankle Left: Right: Point of Measurement: 11 cm From Medial Instep 29 cm Vascular Assessment Pulses: Dorsalis Pedis Palpable: [Left:Yes] Posterior Tibial Palpable: [Left:Yes] Electronic Signature([name]) Signed: 02/26/2022 8:39:24 AM By: Rhae Hammock RN Entered By: Rhae Hammock on 02/25/2022 13:39:20 -------------------------------------------------------------------------------- Multi Wound Chart Details Patient Name: Date of Service: [name] 02/25/2022 1:15 PM Medical Record Number: [id number] Patient Account Number: [id number] Date of Birth/Sex: Treating RN: Jun 30, 1965 (57 y.o.  M) Primary Care Shandrell Boda: [name] Other Clinician: Referring [name]: Treating Seanna Sisler/Extender: [name]: 2 Vital Signs Height(in): 75 Pulse(bpm): 74 Weight(lbs): 220 Blood Pressure(mmHg): 145/74 Body Mass Index(BMI): 27.5 Temperature(F): 99.1 Respiratory Rate(breaths/min): 17 [1:Photos:] [N/A:N/A [id number] Page 3 of 10] Left T Great oe Left, Plantar Foot N/A Wound Location: Shear/Friction Surgical Injury N/A Wounding Event: Neuropathic Ulcer-Non Diabetic Open Surgical Wound N/A Primary Etiology: Lymphedema, Sleep Apnea, Lymphedema, Sleep Apnea, N/A Comorbid History: Hypertension, Peripheral Venous Hypertension, Peripheral Venous Disease, Osteoarthritis, Neuropathy Disease, Osteoarthritis, Neuropathy 01/28/2022 01/14/2022 N/A Date Acquired: 2 2 N/A [name] of [name]: Open Open N/A Wound Status: No No N/A Wound Recurrence: Yes Yes  N/A Pending A mputation on Presentation: 0.7x0.8x0.1 0.6x0.3x0.9 N/A Measurements L x W x D (cm) 0.44 0.141 N/A A (cm) : rea 0.044 0.127 N/A Volume (cm) : 63.60% -19.50% N/A % Reduction in A rea: 63.60% -429.20% N/A % Reduction in Volume: 12 Starting Position 1 (o'clock): 12 Ending Position 1 (o'clock): 0.4 Maximum Distance 1 (cm): No Yes N/A Undermining: Full Thickness Without Exposed Full Thickness Without Exposed N/A Classification: Support Structures Support Structures Medium Medium N/A Exudate A mount: Serosanguineous Serosanguineous N/A Exudate Type: red, brown red, brown N/A Exudate Color: Distinct, outline attached Distinct, outline attached N/A Wound Margin: Large (67-100%) Small (1-33%) N/A Granulation A mount: Red, Pink, Hyper-granulation Pink N/A Granulation Quality: None Present (0%) Large (67-100%) N/A Necrotic A mount: N/A Eschar, Adherent Slough N/A Necrotic Tissue: Fat Layer (Subcutaneous Tissue): Yes Fat  Layer (Subcutaneous Tissue): Yes N/A Exposed Structures: Fascia: No Fascia: No Tendon: No Tendon: No Muscle: No Muscle: No Joint: No Joint: No Bone: No Bone: No Small (1-33%) None N/A Epithelialization: N/A Debridement - Excisional N/A Debridement: Pre-procedure Verification/Time Out N/A 13:55 N/A Taken: N/A Lidocaine N/A Pain Control: N/A Subcutaneous, Slough N/A Tissue Debrided: N/A Skin/Subcutaneous Tissue N/A Level: N/A 0.18 N/A Debridement A (sq cm): rea N/A Curette N/A Instrument: N/A Minimum N/A Bleeding: N/A Pressure N/A Hemostasis A chieved: N/A 0 N/A Procedural Pain: N/A 0 N/A Post Procedural Pain: N/A Procedure was tolerated well N/A Debridement [name] Response: N/A 0.6x0.3x0.9 N/A Post Debridement Measurements L x W x D (cm) N/A 0.127 N/A Post Debridement Volume: (cm) Callus: Yes Callus: Yes N/A Periwound Skin Texture: Excoriation: No Excoriation: No Induration: No Induration: No Crepitus: No Crepitus: No Rash: No Rash: No Scarring: No Scarring: No Maceration: No Maceration: No N/A Periwound Skin Moisture: Dry/Scaly: No Dry/Scaly: No Atrophie Blanche: No Atrophie Blanche: No N/A Periwound Skin Color: Cyanosis: No Cyanosis: No Ecchymosis: No Ecchymosis: No Erythema: No Erythema: No Hemosiderin Staining: No Hemosiderin Staining: No Mottled: No Mottled: No Pallor: No Pallor: No Rubor: No Rubor: No T Contact Cast otal Debridement N/A Procedures Performed: T Contact Cast otal [name] Notes Wound #1 (Toe Great) Wound Laterality: Left Cleanser Wound Cleanser Discharge Instruction: Cleanse the wound with wound cleanser prior to applying a clean dressing using gauze sponges, not tissue or cotton balls. Byram Ancillary Kit - 15 Day Supply Discharge Instruction: Use supplies as instructed; Kit contains: (15) Saline Bullets; (15) 3x3 Gauze; 15 pr Gloves Riviello, Knoxx [name] ([id number]) [id number].pdf  Page 4 of 10 Peri-Wound Care Topical Gentamicin Discharge Instruction: As directed by physician Mupirocin Ointment Discharge Instruction: Apply Mupirocin (Bactroban) as instructed Primary Dressing Hydrofera Blue Ready Transfer Foam, 2.5x2.5 (in/in) Discharge Instruction: Apply directly to wound bed as directed Secondary Dressing Optifoam Non-Adhesive Dressing, 4x4 in Discharge Instruction: Apply x1 layer donut to aid in offloading. Woven Gauze Sponges 2x2 in Discharge Instruction: Apply over primary dressing as directed. Secured With Conforming Stretch Gauze Bandage, Sterile 2x75 (in/in) Discharge Instruction: Secure with stretch gauze as directed. 63M Medipore H Soft Cloth Surgical T ape, 4 x 10 (in/yd) Discharge Instruction: Secure with tape as directed. Compression Wrap Compression Stockings Add-Ons Wound #2 (Foot) Wound Laterality: Plantar, Left Cleanser Wound Cleanser Discharge Instruction: Cleanse the wound with wound cleanser prior to applying a clean dressing using gauze sponges, not tissue or cotton balls. Byram Ancillary Kit - 15 Day Supply Discharge Instruction: Use supplies as instructed; Kit contains: (15) Saline Bullets; (15) 3x3 Gauze; 15 pr Gloves Peri-Wound Care Topical Gentamicin Discharge Instruction: As directed by physician Mupirocin Ointment  Discharge Instruction: Apply Mupirocin (Bactroban) as instructed Primary Dressing Hydrofera Blue Ready Transfer Foam, 2.5x2.5 (in/in) Discharge Instruction: Apply directly to wound bed as directed Secondary Dressing Optifoam Non-Adhesive Dressing, 4x4 in Discharge Instruction: Apply x1 layer donut to aid in offloading. Woven Gauze Sponges 2x2 in Discharge Instruction: Apply over primary dressing as directed. Secured With Conforming Stretch Gauze Bandage, Sterile 2x75 (in/in) Discharge Instruction: Secure with stretch gauze as directed. 78M Medipore H Soft Cloth Surgical T ape, 4 x 10 (in/yd) Discharge  Instruction: Secure with tape as directed. Compression Wrap Compression Stockings Add-Ons Electronic Signature([name]) Signed: 02/25/2022 3:38:55 PM By: Marga Melnick, Jericho [name] ([id number]) By: Kalman Shan DO [id number] Page 5 of 10 Signed: 02/25/2022 3:38:55 PM Entered By: Kalman Shan on 02/25/2022 14:05:48 -------------------------------------------------------------------------------- Multi-Disciplinary Care Plan Details Patient Name: Date of Service: [name], [name] 02/25/2022 1:15 PM Medical Record Number: [id number] Patient Account Number: [id number] Date of Birth/Sex: Treating RN: 06-25-1965 (57 y.o. [name], [name] Primary Care Ardell Makarewicz: [name] Other Clinician: Referring Gifford Ballon: Treating Merina Behrendt/Extender: [name]: 2 Active Inactive Peripheral Neuropathy Nursing Diagnoses: Potential alteration in peripheral tissue perfusion (select prior to confirmation of diagnosis) Goals: Patient/caregiver will verbalize understanding of disease process and disease management Date Initiated: 02/11/2022 Target Resolution Date: 03/07/2022 Goal Status: Active Interventions: Assess signs and symptoms of neuropathy upon admission and as needed Provide education on Management of Neuropathy upon discharge from the [place]: Patient referred for customized footwear/offloading : 02/11/2022 Notes: Wound/Skin Impairment Nursing Diagnoses: Knowledge deficit related to ulceration/compromised skin integrity Goals: Patient/caregiver will verbalize understanding of skin care regimen Date Initiated: 02/11/2022 Target Resolution Date: 03/07/2022 Goal Status: Active Interventions: Assess patient/caregiver ability to perform ulcer/skin care regimen upon admission and as needed Assess ulceration([name]) every visit Provide education on smoking Provide education on ulcer and  skin care [name] Activities: Skin care regimen initiated : 02/11/2022 Smoking cessation education : 02/11/2022 Topical wound management initiated : 02/11/2022 Notes: Electronic Signature([name]) Signed: 02/26/2022 8:39:24 AM By: Rhae Hammock RN Entered By: Rhae Hammock on 02/25/2022 13:48:21 [place], [place] ([id number]) [id number] Page 6 of 10 -------------------------------------------------------------------------------- Pain Assessment Details Patient Name: Date of Service: [name], [name] 02/25/2022 1:15 PM Medical Record Number: [id number] Patient Account Number: [id number] Date of Birth/Sex: Treating RN: 03/06/1965 (57 y.o. [name], [name] Primary Care Sheryn Aldaz: [name] Other Clinician: Referring Lelar Farewell: Treating Zosia Lucchese/Extender: [name] in [name]: 2 Active Problems Location of Pain Severity and Description of Pain Patient Has Paino No Site Locations Pain Management and Medication Current Pain Management: Electronic Signature([name]) Signed: 02/26/2022 8:39:24 AM By: Rhae Hammock RN Entered By: Rhae Hammock on 02/25/2022 13:39:12 -------------------------------------------------------------------------------- Patient/Caregiver Education Details Patient Name: Date of Service: [name] 1/30/2024andnbsp1:15 PM Medical Record Number: [id number] Patient Account Number: [id number] Date of Birth/Gender: Treating RN: 18-Jul-1965 (57 y.o. Erie Noe Primary Care Physician: [name] Other Clinician: Referring Physician: Treating Physician/Extender: [name]: 2 Education Assessment Education Provided To: Patient Education Topics Provided Wound/Skin Impairment: Methods: Explain/Verbal Responses: Reinforcements needed, State content correctly [name], [name] ([id number])  [id number] Page 7 of 10 Electronic Signature([name]) Signed: 02/26/2022 8:39:24 AM By: Rhae Hammock RN Entered By: Rhae Hammock on 02/25/2022 13:48:43 -------------------------------------------------------------------------------- Wound Assessment Details Patient Name: Date of Service: [name], [name] 02/25/2022 1:15 PM Medical Record Number: [id number] Patient Account Number: [id number] Date of Birth/Sex: Treating RN: 10/28/1965 (57 y.o. Erie Noe Primary Care Latravis Grine: [name] Other Clinician: Referring Kayal Mula: Treating Raymie Trani/Extender:  Ronnald Nian, Ajith [name] in [name]: 2 Wound Status Wound Number: 1 Primary Neuropathic Ulcer-Non Diabetic Etiology: Wound Location: Left T Great oe Wound Open Wounding Event: Shear/Friction Status: Date Acquired: 01/28/2022 Comorbid Lymphedema, Sleep Apnea, Hypertension, Peripheral Venous [name] Of [name]: 2 History: Disease, Osteoarthritis, Neuropathy Clustered Wound: No Pending Amputation On Presentation Photos Wound Measurements Length: (cm) 0.7 Width: (cm) 0.8 Depth: (cm) 0.1 Area: (cm) 0.44 Volume: (cm) 0.044 % Reduction in Area: 63.6% % Reduction in Volume: 63.6% Epithelialization: Small (1-33%) Tunneling: No Undermining: No Wound Description Classification: Full Thickness Without Exposed Suppor Wound Margin: Distinct, outline attached Exudate Amount: Medium Exudate Type: Serosanguineous Exudate Color: red, brown t Structures Foul Odor After Cleansing: No Slough/Fibrino Yes Wound Bed Granulation Amount: Large (67-100%) Exposed Structure Granulation Quality: Red, Pink, Hyper-granulation Fascia Exposed: No Necrotic Amount: None Present (0%) Fat Layer (Subcutaneous Tissue) Exposed: Yes Tendon Exposed: No Muscle Exposed: No Joint Exposed: No Bone Exposed: No Periwound Skin Texture Texture Color No Abnormalities Noted: No No Abnormalities  Noted: No Callus: Yes Atrophie Blanche: No Crepitus: No Cyanosis: No [name], [name] ([id number]) [id number] Page 8 of 10 Excoriation: No Ecchymosis: No Induration: No Erythema: No Rash: No Hemosiderin Staining: No Scarring: No Mottled: No Pallor: No Moisture Rubor: No No Abnormalities Noted: No Dry / Scaly: No Maceration: No [name] Notes Wound #1 (Toe Great) Wound Laterality: Left Cleanser Wound Cleanser Discharge Instruction: Cleanse the wound with wound cleanser prior to applying a clean dressing using gauze sponges, not tissue or cotton balls. Byram Ancillary Kit - 15 Day Supply Discharge Instruction: Use supplies as instructed; Kit contains: (15) Saline Bullets; (15) 3x3 Gauze; 15 pr Gloves Peri-Wound Care Topical Gentamicin Discharge Instruction: As directed by physician Mupirocin Ointment Discharge Instruction: Apply Mupirocin (Bactroban) as instructed Primary Dressing Hydrofera Blue Ready Transfer Foam, 2.5x2.5 (in/in) Discharge Instruction: Apply directly to wound bed as directed Secondary Dressing Optifoam Non-Adhesive Dressing, 4x4 in Discharge Instruction: Apply x1 layer donut to aid in offloading. Woven Gauze Sponges 2x2 in Discharge Instruction: Apply over primary dressing as directed. Secured With Conforming Stretch Gauze Bandage, Sterile 2x75 (in/in) Discharge Instruction: Secure with stretch gauze as directed. 61M Medipore H Soft Cloth Surgical T ape, 4 x 10 (in/yd) Discharge Instruction: Secure with tape as directed. Compression Wrap Compression Stockings Add-Ons Electronic Signature([name]) Signed: 02/26/2022 8:39:24 AM By: Rhae Hammock RN Entered By: Rhae Hammock on 02/25/2022 13:45:33 -------------------------------------------------------------------------------- Wound Assessment Details Patient Name: Date of Service: [name], [name] 02/25/2022 1:15 PM Medical Record Number: [id number] Patient Account  Number: [id number] Date of Birth/Sex: Treating RN: 1965-06-12 (57 y.o. Erie Noe Primary Care Jhett Fretwell: [name] Other Clinician: Referring Sunny Gains: Treating Zera Markwardt/Extender: [name] in [name]: 2 Wound Status Wound Number: 2 Primary Open Surgical Wound Etiology: Wound Location: Left, Plantar Foot Wound Open Wounding Event: Surgical Injury [name], [name] ([id number]) [id number] Page 9 of 10 Wounding Event: Surgical Injury Status: Date Acquired: 01/14/2022 Comorbid Lymphedema, Sleep Apnea, Hypertension, Peripheral Venous [name] Of [name]: 2 History: Disease, Osteoarthritis, Neuropathy Clustered Wound: No Pending Amputation On Presentation Photos Wound Measurements Length: (cm) 0.6 Width: (cm) 0.3 Depth: (cm) 0.9 Area: (cm) 0.141 Volume: (cm) 0.127 % Reduction in Area: -19.5% % Reduction in Volume: -429.2% Epithelialization: None Tunneling: No Undermining: Yes Starting Position (o'clock): 12 Ending Position (o'clock): 12 Maximum Distance: (cm) 0.4 Wound Description Classification: Full Thickness Without Exposed Support Structures Wound Margin: Distinct, outline attached Exudate Amount: Medium Exudate Type: Serosanguineous Exudate Color: red, brown Foul Odor After Cleansing: No Slough/Fibrino Yes Wound  Bed Granulation Amount: Small (1-33%) Exposed Structure Granulation Quality: Pink Fascia Exposed: No Necrotic Amount: Large (67-100%) Fat Layer (Subcutaneous Tissue) Exposed: Yes Necrotic Quality: Eschar, Adherent Slough Tendon Exposed: No Muscle Exposed: No Joint Exposed: No Bone Exposed: No Periwound Skin Texture Texture Color No Abnormalities Noted: No No Abnormalities Noted: No Callus: Yes Atrophie Blanche: No Crepitus: No Cyanosis: No Excoriation: No Ecchymosis: No Induration: No Erythema: No Rash: No Hemosiderin Staining: No Scarring: No Mottled:  No Pallor: No Moisture Rubor: No No Abnormalities Noted: No Dry / Scaly: No Maceration: No [name] Notes Wound #2 (Foot) Wound Laterality: Plantar, Left Cleanser Wound Cleanser Discharge Instruction: Cleanse the wound with wound cleanser prior to applying a clean dressing using gauze sponges, not tissue or cotton balls. Byram Ancillary Kit - 15 Day Supply Discharge Instruction: Use supplies as instructed; Kit contains: (15) Saline Bullets; (15) 3x3 Gauze; 15 pr [place], [place] ([id number]) [id number] Page 10 of 10 Gentamicin Discharge Instruction: As directed by physician Mupirocin Ointment Discharge Instruction: Apply Mupirocin (Bactroban) as instructed Primary Dressing Hydrofera Blue Ready Transfer Foam, 2.5x2.5 (in/in) Discharge Instruction: Apply directly to wound bed as directed Secondary Dressing Optifoam Non-Adhesive Dressing, 4x4 in Discharge Instruction: Apply x1 layer donut to aid in offloading. Woven Gauze Sponges 2x2 in Discharge Instruction: Apply over primary dressing as directed. Secured With Conforming Stretch Gauze Bandage, Sterile 2x75 (in/in) Discharge Instruction: Secure with stretch gauze as directed. 29M Medipore H Soft Cloth Surgical T ape, 4 x 10 (in/yd) Discharge Instruction: Secure with tape as directed. Compression Wrap Compression Stockings Add-Ons Electronic Signature([name]) Signed: 02/26/2022 8:39:24 AM By: Rhae Hammock RN Entered By: Rhae Hammock on 02/25/2022 13:45:54 -------------------------------------------------------------------------------- Vitals Details Patient Name: Date of Service: [name], [name] 02/25/2022 1:15 PM Medical Record Number: [id number] Patient Account Number: [id number] Date of Birth/Sex: Treating RN: January 14, 1966 (57 y.o. [name], [name] Primary Care Zamia Tyminski: [name] Other Clinician: Referring Lindbergh Winkles: Treating Izzabell Klasen/Extender:  [name] in [name]: 2 Vital Signs Time Taken: 13:39 Temperature (F): 99.1 Height (in): 75 Pulse (bpm): 74 Weight (lbs): 220 Respiratory Rate (breaths/min): 17 Body Mass Index (BMI): 27.5 Blood Pressure (mmHg): 145/74 Reference Range: 80 - 120 mg / dl Electronic Signature([name]) Signed: 02/26/2022 8:39:24 AM By: Rhae Hammock RN Entered By: Rhae Hammock on 02/25/2022 13:39:44

## 2022-02-27 ENCOUNTER — Encounter (HOSPITAL_BASED_OUTPATIENT_CLINIC_OR_DEPARTMENT_OTHER): Payer: BC Managed Care – PPO | Attending: Internal Medicine | Admitting: Internal Medicine

## 2022-02-27 DIAGNOSIS — M1990 Unspecified osteoarthritis, unspecified site: Secondary | ICD-10-CM | POA: Insufficient documentation

## 2022-02-27 DIAGNOSIS — G4733 Obstructive sleep apnea (adult) (pediatric): Secondary | ICD-10-CM

## 2022-02-27 DIAGNOSIS — I872 Venous insufficiency (chronic) (peripheral): Secondary | ICD-10-CM | POA: Diagnosis not present

## 2022-02-27 DIAGNOSIS — L97522 Non-pressure chronic ulcer of other part of 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: Secondary | ICD-10-CM

## 2022-02-27 DIAGNOSIS — G9009 Other idiopathic peripheral autonomic neuropathy: Secondary | ICD-10-CM | POA: Diagnosis not present

## 2022-02-27 DIAGNOSIS — L02612 Cutaneous abscess of left foot: Secondary | ICD-10-CM | POA: Insufficient documentation

## 2022-02-27 DIAGNOSIS — G629 Polyneuropathy, unspecified: Secondary | ICD-10-CM | POA: Insufficient documentation

## 2022-02-27 NOTE — Progress Notes (Signed)
LOCHLANN, MASTRANGELO S ([id number]) [id number] Page 1 of 9 Visit Report for 02/27/2022 Arrival Information Details Patient Name: Date of Service: [name], [name] 02/27/2022 2:00 PM Medical Record Number: [id number] Patient Account Number: [id number] Date of Birth/Sex: Treating RN: 09-Oct-1965 (57 y.o. Mare Ferrari Primary Care Yulia Ulrich: [name] Other Clinician: Referring Francely Craw: Treating Yarenis Cerino/Extender: [name] in Treatment: 2 Visit Information History Since Last Visit Added or deleted any medications: No Patient Arrived: Ambulatory Any new allergies or adverse reactions: No Arrival Time: 14:13 Had a fall or experienced change in No Accompanied By: self activities of daily living that may affect Transfer Assistance: None risk of falls: Patient Identification Verified: Yes Signs or symptoms of abuse/neglect since last visito No Secondary Verification Process Completed: Yes Hospitalized since last visit: No Patient Requires Transmission-Based No Implantable device outside of the clinic excluding No Precautions: cellular tissue based products placed in the center Patient Has Alerts: Yes since last visit: Patient Alerts: 11/27/21 ABI L 1.26 R1.09 Has Dressing in Place as Prescribed: Yes 11/27/2021 TBI L0.72 Has Footwear/Offloading in Place as Prescribed: Yes R0.66 Left: T Contact Cast otal Pain Present Now: No Electronic Signature(s) Signed: 02/27/2022 4:05:19 PM By: Sharyn Creamer RN, BSN Entered By: Sharyn Creamer on 02/27/2022 14:15:24 -------------------------------------------------------------------------------- Encounter Discharge Information Details Patient Name: Date of Service: [name]. 02/27/2022 2:00 PM Medical Record Number: [id number] Patient Account Number: [id number] Date of Birth/Sex: Treating RN: January 24, 1966 (57 y.o. Mare Ferrari Primary Care Taleyah Hillman: [name]  Other Clinician: Referring Monea Pesantez: Treating Quaid Yeakle/Extender: [name] in Treatment: 2 Encounter Discharge Information Items Discharge Condition: Stable Ambulatory Status: Ambulatory Discharge Destination: Home Transportation: Private Auto Accompanied By: self Schedule Follow-up Appointment: Yes Clinical Summary of Care: Patient Declined Electronic Signature(s) Signed: 02/27/2022 4:05:19 PM By: Sharyn Creamer RN, BSN Entered By: Sharyn Creamer on 02/27/2022 15:40:15 Haskin, Antionette Char ([id number]) [id number] Page 2 of 9 -------------------------------------------------------------------------------- Lower Extremity Assessment Details Patient Name: Date of Service: ABDI, HUSAK 02/27/2022 2:00 PM Medical Record Number: [id number] Patient Account Number: [id number] Date of Birth/Sex: Treating RN: 1965-04-04 (57 y.o. Mare Ferrari Primary Care Ithzel Fedorchak: [name] Other Clinician: Referring Mennie Spiller: Treating Jaye Polidori/Extender: [name]: 2 Edema Assessment Assessed: [Left: No] [Right: No] Edema: [Left: Ye] [Right: s] Calf Left: Right: Point of Measurement: 43 cm From Medial Instep 41 cm Ankle Left: Right: Point of Measurement: 11 cm From Medial Instep 27.5 cm Vascular Assessment Pulses: Dorsalis Pedis Palpable: [Left:Yes] Electronic Signature(s) Signed: 02/27/2022 4:05:19 PM By: Sharyn Creamer RN, BSN Entered By: Sharyn Creamer on 02/27/2022 14:26:22 -------------------------------------------------------------------------------- Multi Wound Chart Details Patient Name: Date of Service: [name]. 02/27/2022 2:00 PM Medical Record Number: [id number] Patient Account Number: [id number] Date of Birth/Sex: Treating RN: 1965-07-13 (57 y.o. M) Primary Care Sharnese Heath: [name] Other Clinician: Referring Weltha Cathy: Treating Sherron Mapp/Extender:  [name] in Treatment: 2 Vital Signs Height(in): 75 Pulse(bpm): 67 Weight(lbs): 220 Blood Pressure(mmHg): 154/81 Body Mass Index(BMI): 27.5 Temperature(F): 98.4 Respiratory Rate(breaths/min): 18 [1:Photos:] [N/A:N/A] Left T Great oe Left, Plantar Foot N/A Wound Location: Shear/Friction Surgical Injury N/A Wounding Event: RAGNAR, WAAS ([id number]) [id number] Page 3 of 9 Neuropathic Ulcer-Non Diabetic Open Surgical Wound N/A Primary Etiology: Lymphedema, Sleep Apnea, Lymphedema, Sleep Apnea, N/A Comorbid History: Hypertension, Peripheral Venous Hypertension, Peripheral Venous Disease, Osteoarthritis, Neuropathy Disease, Osteoarthritis, Neuropathy 01/28/2022 01/14/2022 N/A Date Acquired: 2 2 N/A Weeks of Treatment: Open Open N/A Wound Status: No No N/A Wound Recurrence: Yes Yes N/A Pending  A mputation on Presentation: 0.5x0.7x0.1 0.4x0.4x0.5 N/A Measurements L x W x D (cm) 0.275 0.126 N/A A (cm) : rea 0.027 0.063 N/A Volume (cm) : 77.30% -6.80% N/A % Reduction in A rea: 77.70% -162.50% N/A % Reduction in Volume: Full Thickness Without Exposed Full Thickness Without Exposed N/A Classification: Support Structures Support Structures Medium Medium N/A Exudate Amount: Serosanguineous Serosanguineous N/A Exudate Type: red, brown red, brown N/A Exudate Color: Distinct, outline attached Distinct, outline attached N/A Wound Margin: Large (67-100%) Small (1-33%) N/A Granulation Amount: Red, Pink, Hyper-granulation Red, Pink N/A Granulation Quality: Small (1-33%) Large (67-100%) N/A Necrotic Amount: Fat Layer (Subcutaneous Tissue): Yes Fat Layer (Subcutaneous Tissue): Yes N/A Exposed Structures: Fascia: No Fascia: No Tendon: No Tendon: No Muscle: No Muscle: No Joint: No Joint: No Bone: No Bone: No Small (1-33%) None N/A Epithelialization: Callus: Yes Callus: Yes N/A Periwound Skin  Texture: Excoriation: No Excoriation: No Induration: No Induration: No Crepitus: No Crepitus: No Rash: No Rash: No Scarring: No Scarring: No Maceration: No Maceration: No N/A Periwound Skin Moisture: Dry/Scaly: No Dry/Scaly: No Atrophie Blanche: No Atrophie Blanche: No N/A Periwound Skin Color: Cyanosis: No Cyanosis: No Ecchymosis: No Ecchymosis: No Erythema: No Erythema: No Hemosiderin Staining: No Hemosiderin Staining: No Mottled: No Mottled: No Pallor: No Pallor: No Rubor: No Rubor: No T Contact Cast otal T Contact Cast otal N/A Procedures Performed: Treatment Notes Electronic Signature(s) Signed: 02/27/2022 4:03:41 PM By: Kalman Shan DO Entered By: Kalman Shan on 02/27/2022 14:38:39 -------------------------------------------------------------------------------- Multi-Disciplinary Care Plan Details Patient Name: Date of Service: [name]. 02/27/2022 2:00 PM Medical Record Number: [id number] Patient Account Number: [id number] Date of Birth/Sex: Treating RN: 09/12/1965 (57 y.o. Mare Ferrari Primary Care Bellarose Burtt: [name] Other Clinician: Referring Madiha Bambrick: Treating Justinn Welter/Extender: [name] in Treatment: 2 Active Inactive Peripheral Neuropathy Nursing Diagnoses: Potential alteration in peripheral tissue perfusion (select prior to confirmation of diagnosis) KENRY, DAUBERT S ([id number]) [id number] Page 4 of 9 Goals: Patient/caregiver will verbalize understanding of disease process and disease management Date Initiated: 02/11/2022 Target Resolution Date: 03/07/2022 Goal Status: Active Interventions: Assess signs and symptoms of neuropathy upon admission and as needed Provide education on Management of Neuropathy upon discharge from the [facility]: Patient referred for customized footwear/offloading : 02/11/2022 Notes: Wound/Skin  Impairment Nursing Diagnoses: Knowledge deficit related to ulceration/compromised skin integrity Goals: Patient/caregiver will verbalize understanding of skin care regimen Date Initiated: 02/11/2022 Target Resolution Date: 03/07/2022 Goal Status: Active Interventions: Assess patient/caregiver ability to perform ulcer/skin care regimen upon admission and as needed Assess ulceration(s) every visit Provide education on smoking Provide education on ulcer and skin care Treatment Activities: Skin care regimen initiated : 02/11/2022 Smoking cessation education : 02/11/2022 Topical wound management initiated : 02/11/2022 Notes: Electronic Signature(s) Signed: 02/27/2022 4:05:19 PM By: Sharyn Creamer RN, BSN Entered By: Sharyn Creamer on 02/27/2022 14:38:55 -------------------------------------------------------------------------------- Pain Assessment Details Patient Name: Date of Service: [name]. 02/27/2022 2:00 PM Medical Record Number: [id number] Patient Account Number: [id number] Date of Birth/Sex: Treating RN: [birth year] (57 y.o. Mare Ferrari Primary Care Bertha Earwood: [name] Other Clinician: Referring Betzaida Cremeens: Treating Graylee Arutyunyan/Extender: [name]: 2 Active Problems Location of Pain Severity and Description of Pain Patient Has Paino No Site Locations [place], [place] S ([id number]) [id number] Page 5 of 9 Pain Management and Medication Current Pain Management: Electronic Signature(s) Signed: 02/27/2022 4:05:19 PM By: Sharyn Creamer RN, BSN Entered By: Sharyn Creamer on 02/27/2022 14:15:33 -------------------------------------------------------------------------------- Patient/Caregiver Education Details Patient Name: Date of Service: Joneen Caraway,  Laura S. 2/1/2024andnbsp2:00 PM Medical Record Number: [id number] Patient Account Number: [id number] Date of Birth/Gender: Treating RN: 03-16-1965  (57 y.o. Mare Ferrari Primary Care Physician: [name] Other Clinician: Referring Physician: Treating Physician/Extender: [name] in Treatment: 2 Education Assessment Education Provided To: Patient Education Topics Provided Wound/Skin Impairment: Methods: Explain/Verbal Responses: State content correctly Electronic Signature(s) Signed: 02/27/2022 4:05:19 PM By: Sharyn Creamer RN, BSN Entered By: Sharyn Creamer on 02/27/2022 14:39:13 -------------------------------------------------------------------------------- Wound Assessment Details Patient Name: Date of Service: [name]. 02/27/2022 2:00 PM Medical Record Number: [id number] Patient Account Number: [id number] Date of Birth/Sex: Treating RN: 02-12-1965 (57 y.o. Mare Ferrari Primary Care Margurette Brener: [name] Other Clinician: Referring Doshie Maggi: Treating Shourya Macpherson/Extender: Hattie Perch [place], [place] ([id number]) [id number] Page 6 of 9 Weeks in Treatment: 2 Wound Status Wound Number: 1 Primary Neuropathic Ulcer-Non Diabetic Etiology: Wound Location: Left T Great oe Wound Open Wounding Event: Shear/Friction Status: Date Acquired: 01/28/2022 Comorbid Lymphedema, Sleep Apnea, Hypertension, Peripheral Venous Weeks Of Treatment: 2 History: Disease, Osteoarthritis, Neuropathy Clustered Wound: No Pending Amputation On Presentation Photos Wound Measurements Length: (cm) 0.5 Width: (cm) 0.7 Depth: (cm) 0.1 Area: (cm) 0.275 Volume: (cm) 0.027 % Reduction in Area: 77.3% % Reduction in Volume: 77.7% Epithelialization: Small (1-33%) Tunneling: No Undermining: No Wound Description Classification: Full Thickness Without Exposed Support Structures Wound Margin: Distinct, outline attached Exudate Amount: Medium Exudate Type: Serosanguineous Exudate Color: red, brown Foul Odor After Cleansing:  No Slough/Fibrino Yes Wound Bed Granulation Amount: Large (67-100%) Exposed Structure Granulation Quality: Red, Pink, Hyper-granulation Fascia Exposed: No Necrotic Amount: Small (1-33%) Fat Layer (Subcutaneous Tissue) Exposed: Yes Necrotic Quality: Adherent Slough Tendon Exposed: No Muscle Exposed: No Joint Exposed: No Bone Exposed: No Periwound Skin Texture Texture Color No Abnormalities Noted: No No Abnormalities Noted: No Callus: Yes Atrophie Blanche: No Crepitus: No Cyanosis: No Excoriation: No Ecchymosis: No Induration: No Erythema: No Rash: No Hemosiderin Staining: No Scarring: No Mottled: No Pallor: No Moisture Rubor: No No Abnormalities Noted: No Dry / Scaly: No Maceration: No Treatment Notes Wound #1 (Toe Great) Wound Laterality: Left Cleanser Wound Cleanser Discharge Instruction: Cleanse the wound with wound cleanser prior to applying a clean dressing using gauze sponges, not tissue or cotton balls. Byram Ancillary Kit - 15 Day Supply Discharge Instruction: Use supplies as instructed; Kit contains: (15) Saline Bullets; (15) 3x3 Gauze; 15 pr [place], [place] ([id number]) [id number] Page 7 of 9 Topical Gentamicin Discharge Instruction: As directed by physician Mupirocin Ointment Discharge Instruction: Apply Mupirocin (Bactroban) as instructed Primary Dressing Hydrofera Blue Ready Transfer Foam, 2.5x2.5 (in/in) Discharge Instruction: Apply directly to wound bed as directed Secondary Dressing Optifoam Non-Adhesive Dressing, 4x4 in Discharge Instruction: Apply x1 layer donut to aid in offloading. Woven Gauze Sponges 2x2 in Discharge Instruction: Apply over primary dressing as directed. Secured With Conforming Stretch Gauze Bandage, Sterile 2x75 (in/in) Discharge Instruction: Secure with stretch gauze as directed. 71M Medipore H Soft Cloth Surgical T ape, 4 x 10 (in/yd) Discharge Instruction: Secure  with tape as directed. Compression Wrap Compression Stockings Add-Ons Electronic Signature(s) Signed: 02/27/2022 4:05:19 PM By: Sharyn Creamer RN, BSN Entered By: Sharyn Creamer on 02/27/2022 14:31:24 -------------------------------------------------------------------------------- Wound Assessment Details Patient Name: Date of Service: [name]. 02/27/2022 2:00 PM Medical Record Number: [id number] Patient Account Number: [id number] Date of Birth/Sex: Treating RN: 1966/01/10 (57 y.o. Mare Ferrari Primary Care Ramsha Lonigro: [name] Other Clinician: Referring Myldred Raju: Treating Marqueze Ramcharan/Extender: [name]: 2 Wound Status Wound  Number: 2 Primary Open Surgical Wound Etiology: Wound Location: Left, Plantar Foot Wound Open Wounding Event: Surgical Injury Status: Date Acquired: 01/14/2022 Comorbid Lymphedema, Sleep Apnea, Hypertension, Peripheral Venous Weeks Of Treatment: 2 History: Disease, Osteoarthritis, Neuropathy Clustered Wound: No Pending Amputation On Presentation Photos Wound Measurements [place], Knowledge S ([id number]) Length: (cm) 0.4 Width: (cm) 0.4 Depth: (cm) 0.5 Area: (cm) 0.126 Volume: (cm) 0.063 [id number] Page 8 of 9 % Reduction in Area: -6.8% % Reduction in Volume: -162.5% Epithelialization: None Tunneling: No Undermining: No Wound Description Classification: Full Thickness Without Exposed Support Structures Wound Margin: Distinct, outline attached Exudate Amount: Medium Exudate Type: Serosanguineous Exudate Color: red, brown Foul Odor After Cleansing: No Slough/Fibrino Yes Wound Bed Granulation Amount: Small (1-33%) Exposed Structure Granulation Quality: Red, Pink Fascia Exposed: No Necrotic Amount: Large (67-100%) Fat Layer (Subcutaneous Tissue) Exposed: Yes Necrotic Quality: Adherent Slough Tendon Exposed: No Muscle Exposed: No Joint Exposed:  No Bone Exposed: No Periwound Skin Texture Texture Color No Abnormalities Noted: No No Abnormalities Noted: No Callus: Yes Atrophie Blanche: No Crepitus: No Cyanosis: No Excoriation: No Ecchymosis: No Induration: No Erythema: No Rash: No Hemosiderin Staining: No Scarring: No Mottled: No Pallor: No Moisture Rubor: No No Abnormalities Noted: No Dry / Scaly: No Maceration: No Treatment Notes Wound #2 (Foot) Wound Laterality: Plantar, Left Cleanser Wound Cleanser Discharge Instruction: Cleanse the wound with wound cleanser prior to applying a clean dressing using gauze sponges, not tissue or cotton balls. Byram Ancillary Kit - 15 Day Supply Discharge Instruction: Use supplies as instructed; Kit contains: (15) Saline Bullets; (15) 3x3 Gauze; 15 pr Gloves Peri-Wound Care Topical Gentamicin Discharge Instruction: As directed by physician Mupirocin Ointment Discharge Instruction: Apply Mupirocin (Bactroban) as instructed Primary Dressing Hydrofera Blue Ready Transfer Foam, 2.5x2.5 (in/in) Discharge Instruction: Apply directly to wound bed as directed Secondary Dressing Optifoam Non-Adhesive Dressing, 4x4 in Discharge Instruction: Apply x1 layer donut to aid in offloading. Woven Gauze Sponges 2x2 in Discharge Instruction: Apply over primary dressing as directed. Secured With Conforming Stretch Gauze Bandage, Sterile 2x75 (in/in) Discharge Instruction: Secure with stretch gauze as directed. 4M Medipore H Soft Cloth Surgical T ape, 4 x 10 (in/yd) Discharge Instruction: Secure with tape as directed. Compression Wrap Compression Stockings BUCK, MCAFFEE S ([id number]) [id number] Page 9 of 9 Add-Ons Electronic Signature(s) Signed: 02/27/2022 4:05:19 PM By: Sharyn Creamer RN, BSN Entered By: Sharyn Creamer on 02/27/2022 14:31:54 -------------------------------------------------------------------------------- [place] Details Patient Name: Date of  Service: [name]. 02/27/2022 2:00 PM Medical Record Number: [id number] Patient Account Number: [id number] Date of Birth/Sex: Treating RN: Sep 23, 1965 (57 y.o. Mare Ferrari Primary Care Debhora Titus: [name] Other Clinician: Referring Crescentia Boutwell: Treating Camry Robello/Extender: [name] in Treatment: 2 Vital Signs Time Taken: 14:12 Temperature (F): 98.4 Height (in): 75 Pulse (bpm): 67 Weight (lbs): 220 Respiratory Rate (breaths/min): 18 Body Mass Index (BMI): 27.5 Blood Pressure (mmHg): 154/81 Reference Range: 80 - 120 mg / dl Electronic Signature(s) Signed: 02/27/2022 4:05:19 PM By: Sharyn Creamer RN, BSN Entered By: Sharyn Creamer on 02/27/2022 14:13:53

## 2022-02-27 NOTE — Progress Notes (Signed)
[name] [name] ([id number]) [id number] Page 1 of 9 Visit Report for 02/27/2022 Chief Complaint Document Details Patient Name: Date of Service: [name] [name] 02/27/2022 2:00 PM Medical Record Number: [id number] Patient Account Number: [id number] Date of Birth/Sex: Treating RN: January 03, 1966 (57 y.o. M) Primary Care Provider: [name] Other Clinician: Referring Provider: Treating Provider/Extender: [name] in Treatment: 2 Information Obtained from: Patient Chief Complaint 02/11/2022 left great toe wound and fourth sub-met head wound Electronic Signature([name]) Signed: 02/27/2022 4:03:41 PM By: Kalman Shan DO Entered By: Kalman Shan on 02/27/2022 14:38:49 -------------------------------------------------------------------------------- HPI Details Patient Name: Date of Service: [name] [name]. 02/27/2022 2:00 PM Medical Record Number: [id number] Patient Account Number: [id number] Date of Birth/Sex: Treating RN: 01-15-1966 (57 y.o. M) Primary Care Provider: [name] Other Clinician: Referring Provider: Treating Provider/Extender: [name] in Treatment: 2 History of Present Illness HPI Description: 02/11/2022 [name] [name] is a 57 year old male with a past medical history of current tobacco user, OSA, venous insufficiency and peripheral neuropathy that presents the clinic for a 2-week history of ulcer to the left great toe and 2-60-monthhistory of nonhealing ulcer to the left plantar foot. [place] works in a [place] all day. He wears steel toed boots. He is currently out of work due to his wounds. On 11/26/2021 he was admitted to the hospital for cellulitis and abscess to the left foot. 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 He states he has been on doxycycline for 10 weeks. He just completed this course. He  uses regular tennis shoes for walking. He is not offloading the area. He is using Betadine for wound dressings. He has been following with Dr. PPosey Pronto podiatry who Referred to our clinic. 1/23; patient presents for follow-up. He has been using Medihoney and Hydrofera Blue to the wound beds Along with his surgical shoe and offloading peg assist. He has no issues or complaints today. He denies signs of infection. 1/30; patient presents for follow-up. He has been using Medihoney and Hydrofera Blue to the wound beds. He has no issues or complaints today. Plan is for the total contact cast and patient is agreeable to move forward with this. 2/1; patient presents for follow-up. We have been using antibiotic ointment with Hydrofera Blue to the wound beds under the total contact cast. He presents today for his obligatory cast change. He had no issues with the cast. Electronic Signature([name]) Signed: 02/27/2022 4:03:41 PM By: HKalman ShanDO Entered By: HKalman Shanon 02/27/2022 14:39:13 [name], [name] [name] ([id number] Page 2 of 9 -------------------------------------------------------------------------------- Physical Exam Details Patient Name: Date of Service: [name], [name]2/01/2022 2:00 PM Medical Record Number: [id number] Account Number: [id number] of Birth/Sex: Treating RN: 607-27-1967(57y.o. M) Primary Care Provider: RMerrilee SeashoreOther Clinician: Referring Provider: Treating Provider/Extender: HHattie PerchWeeks in Treatment: 2 Constitutional respirations regular, non-labored and within target range for patient.. Cardiovascular 2+ dorsalis pedis/posterior tibialis pulses. Psychiatric pleasant and cooperative. Notes Left foot: T the plantar aspect of the great toe there is an open wound with granulation tissue. T the plantar aspect of the foot there is an open wound with o o granulation tissue. No  signs of surrounding soft tissue infection to any of the wound beds. Electronic Signature([name]) Signed: 02/27/2022 4:03:41 PM By: HKalman ShanDO Entered By: HKalman Shanon 02/27/2022 14:40:11 -------------------------------------------------------------------------------- Physician Orders Details Patient Name: Date of Service: RRod MaeS. 02/27/2022 2:00 PM  Medical Record Number: [id number] Patient Account Number: [id number] Date of Birth/Sex: Treating RN: [birth year] (57 y.o. [name] [name] Primary Care Provider: [name] Other Clinician: Referring Provider: Treating Provider/Extender: [name] in Treatment: 2 Verbal / Phone Orders: No Diagnosis Coding ICD-10 Coding Code Description [phone] 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 G47.33 Obstructive sleep apnea (adult) (pediatric) I87.2 Venous insufficiency (chronic) (peripheral) Follow-up Appointments ppointment in 1 week. - Dr. Heber [place] Thursday 03/01/22 @ 9:00 Rm # 7 ***TCC*** Return A Anesthetic (In clinic) Topical Lidocaine 4% applied to wound bed Bathing/ Shower/ Hygiene May shower with protection but do not get wound dressing([name]) wet. Protect dressing([name]) with water repellant cover (for example, large plastic bag) or a cast cover and may then take shower. Edema Control - Lymphedema / SCD / Other Elevate legs to the level of the heart or above for 30 minutes daily and/or when sitting for 3-4 times a day throughout the day. Avoid standing for long periods of time. Off-Loading [name], [name] [name] ([id number]) [id number] Page 3 of 9 Open toe surgical shoe to: - with offloading Peg Assist to aid in offloading pressure to left plantar foot. Wound Treatment Wound #1 - T Great oe Wound Laterality: Left Cleanser: Wound Cleanser 1 x Per Day/30 Days Discharge  Instructions: Cleanse the wound with wound cleanser prior to applying a clean dressing using gauze sponges, not tissue or cotton balls. Cleanser: Byram Ancillary Kit - 15 Day Supply (Generic) 1 x Per Day/30 Days Discharge Instructions: Use supplies as instructed; Kit contains: (15) Saline Bullets; (15) 3x3 Gauze; 15 pr Gloves Topical: Gentamicin 1 x Per Day/30 Days Discharge Instructions: As directed by physician Topical: Mupirocin Ointment 1 x Per Day/30 Days Discharge Instructions: Apply Mupirocin (Bactroban) as instructed Prim Dressing: Hydrofera Blue Ready Transfer Foam, 2.5x2.5 (in/in) (Generic) 1 x Per Day/30 Days ary Discharge Instructions: Apply directly to wound bed as directed Secondary Dressing: Optifoam Non-Adhesive Dressing, 4x4 in (Generic) 1 x Per Day/30 Days Discharge Instructions: Apply x1 layer donut to aid in offloading. Secondary Dressing: Woven Gauze Sponges 2x2 in (Generic) 1 x Per Day/30 Days Discharge Instructions: Apply over primary dressing as directed. Secured With: Child psychotherapist, Sterile 2x75 (in/in) (Generic) 1 x Per Day/30 Days Discharge Instructions: Secure with stretch gauze as directed. Secured With: 36M Medipore H Soft Cloth Surgical T ape, 4 x 10 (in/yd) (Generic) 1 x Per Day/30 Days Discharge Instructions: Secure with tape as directed. Wound #2 - Foot Wound Laterality: Plantar, Left Cleanser: Wound Cleanser 1 x Per Day/30 Days Discharge Instructions: Cleanse the wound with wound cleanser prior to applying a clean dressing using gauze sponges, not tissue or cotton balls. Cleanser: Byram Ancillary Kit - 15 Day Supply (Generic) 1 x Per Day/30 Days Discharge Instructions: Use supplies as instructed; Kit contains: (15) Saline Bullets; (15) 3x3 Gauze; 15 pr Gloves Topical: Gentamicin 1 x Per Day/30 Days Discharge Instructions: As directed by physician Topical: Mupirocin Ointment 1 x Per Day/30 Days Discharge Instructions: Apply Mupirocin  (Bactroban) as instructed Prim Dressing: Hydrofera Blue Ready Transfer Foam, 2.5x2.5 (in/in) (Generic) 1 x Per Day/30 Days ary Discharge Instructions: Apply directly to wound bed as directed Secondary Dressing: Optifoam Non-Adhesive Dressing, 4x4 in (Generic) 1 x Per Day/30 Days Discharge Instructions: Apply x1 layer donut to aid in offloading. Secondary Dressing: Woven Gauze Sponges 2x2 in (Generic) 1 x Per Day/30 Days Discharge Instructions: Apply over primary dressing as directed. Secured  With: Conforming Stretch Gauze Bandage, Sterile 2x75 (in/in) (Generic) 1 x Per Day/30 Days Discharge Instructions: Secure with stretch gauze as directed. Secured With: 24M Medipore H Soft Cloth Surgical T ape, 4 x 10 (in/yd) (Generic) 1 x Per Day/30 Days Discharge Instructions: Secure with tape as directed. Patient Medications llergies: No Known Drug Allergies A Notifications Medication Indication Start End prior to debridement 02/27/2022 lidocaine DOSE topical 5 % ointment - ointment topical once daily Electronic Signature([name]) Signed: 02/27/2022 4:03:41 PM By: Kalman Shan DO Entered By: Kalman Shan on 02/27/2022 14:40:18 [place], [place] ([id number]) [id number] Page 4 of 9 -------------------------------------------------------------------------------- Problem List Details Patient Name: Date of Service: HOLGER, SOKOLOWSKI 02/27/2022 2:00 PM Medical Record Number: [id number] Patient Account Number: [id number] Date of Birth/Sex: Treating RN: 10/26/1965 (57 y.o. M) Primary Care Provider: [name] Other Clinician: Referring Provider: Treating Provider/Extender: [name] in Treatment: 2 Active Problems ICD-10 Encounter Code Description Active Date MDM Diagnosis [phone] Non-pressure chronic ulcer of other part of 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 02/11/2022 No Yes G90.09 Other idiopathic peripheral autonomic neuropathy  02/11/2022 No Yes G47.33 Obstructive sleep apnea (adult) (pediatric) 02/11/2022 No Yes I87.2 Venous insufficiency (chronic) (peripheral) 02/11/2022 No Yes Inactive Problems Resolved Problems Electronic Signature([name]) Signed: 02/27/2022 4:03:41 PM By: Kalman Shan DO Entered By: Kalman Shan on 02/27/2022 14:38:24 -------------------------------------------------------------------------------- Progress Note Details Patient Name: Date of Service: [name] [name]. 02/27/2022 2:00 PM Medical Record Number: [id number] Patient Account Number: [id number] Date of Birth/Sex: Treating RN: 24-Oct-1965 (57 y.o. M) Primary Care Provider: [name] Other Clinician: Referring Provider: Treating Provider/Extender: [name] in Treatment: 2 Subjective Chief Complaint Information obtained from Patient 02/11/2022 left great toe wound and fourth sub-met head wound History of Present Illness (HPI) 02/11/2022 Mr. Chinedu Agustin is a 57 year old male with a past medical history of current tobacco user, OSA, venous insufficiency and peripheral neuropathy that presents the clinic for a 2-week history of ulcer to the left great toe and 2-20-monthhistory of nonhealing ulcer to the left plantar foot. [place] in a [place] all day. He wears steel toed boots. He is currently out of work due to his wounds. On 11/26/2021 he was admitted to the hospital for cellulitis and abscess to the left foot. 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 He states he has been on doxycycline for 1[place]([id number] Page 5 of 9 weeks. He just completed this course. He uses regular tennis shoes for walking. He is not offloading the area. He is using Betadine for wound dressings. He has been following with Dr. PPosey Pronto podiatry who Referred to our clinic. 1/23; patient presents for follow-up.  He has been using Medihoney and Hydrofera Blue to the wound beds Along with his surgical shoe and offloading peg assist. He has no issues or complaints today. He denies signs of infection. 1/30; patient presents for follow-up. He has been using Medihoney and Hydrofera Blue to the wound beds. He has no issues or complaints today. Plan is for the total contact cast and patient is agreeable to move forward with this. 2/1; patient presents for follow-up. We have been using antibiotic ointment with Hydrofera Blue to the wound beds under the total contact cast. He presents today for his obligatory cast change. He had no issues with the cast. Patient History Family History Heart Disease - Mother, Lung Disease - Mother. Social History Current every day smoker - 10 cig  a day, Alcohol Use - Never, Drug Use - No History, Caffeine Use - Never. Medical History Hematologic/Lymphatic Patient has history of Lymphedema - pumps use daily Respiratory Patient has history of Sleep Apnea - CPAP Cardiovascular Patient has history of Hypertension, Peripheral Venous Disease - needs ablation on left leg Musculoskeletal Patient has history of Osteoarthritis Denies history of Osteomyelitis Neurologic Patient has history of Neuropathy Hospitalization/Surgery History - 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. Medical A Surgical History Notes nd Musculoskeletal abscess to left foot 3rd toe 11/28/2022 Objective Constitutional respirations regular, non-labored and within target range for patient.. Vitals Time Taken: 2:12 PM, Height: 75 in, Weight: 220 lbs, BMI: 27.5, Temperature: 98.4 F, Pulse: 67 bpm, Respiratory Rate: 18 breaths/min, Blood Pressure: 154/81 mmHg. Cardiovascular 2+ dorsalis pedis/posterior tibialis pulses. Psychiatric pleasant and cooperative. General Notes: Left foot: T the plantar aspect of the great toe there is an open wound with granulation tissue. T the plantar aspect of the foot  there is an o o open wound with granulation tissue. No signs of surrounding soft tissue infection to any of the wound beds. Integumentary (Hair, Skin) Wound #1 status is Open. Original cause of wound was Shear/Friction. The date acquired was: 01/28/2022. The wound has been in treatment 2 weeks. The wound is located on the Left T Great. The wound measures 0.5cm length x 0.7cm width x 0.1cm depth; 0.275cm^2 area and 0.027cm^3 volume. There is Fat Layer oe (Subcutaneous Tissue) exposed. There is no tunneling or undermining noted. There is a medium amount of serosanguineous drainage noted. The wound margin is distinct with the outline attached to the wound base. There is large (67-100%) red, pink, hyper - granulation within the wound bed. There is a small (1-33%) amount of necrotic tissue within the wound bed including Adherent Slough. The periwound skin appearance exhibited: Callus. The periwound skin appearance did not exhibit: Crepitus, Excoriation, Induration, Rash, Scarring, Dry/Scaly, Maceration, Atrophie Blanche, Cyanosis, Ecchymosis, Hemosiderin Staining, Mottled, Pallor, Rubor, Erythema. Wound #2 status is Open. Original cause of wound was Surgical Injury. The date acquired was: 01/14/2022. The wound has been in treatment 2 weeks. The wound is located on the [place]. The wound measures 0.4cm length x 0.4cm width x 0.5cm depth; 0.126cm^2 area and 0.063cm^3 volume. There is Fat Layer (Subcutaneous Tissue) exposed. There is no tunneling or undermining noted. There is a medium amount of serosanguineous drainage noted. The wound margin is distinct with the outline attached to the wound base. There is small (1-33%) red, pink granulation within the wound bed. There is a large (67- 100%) amount of necrotic tissue within the wound bed including Adherent Slough. The periwound skin appearance exhibited: Callus. The periwound skin appearance did not exhibit: Crepitus, Excoriation, Induration, Rash,  Scarring, Dry/Scaly, Maceration, Atrophie Blanche, Cyanosis, Ecchymosis, Hemosiderin Staining, Mottled, Pallor, Rubor, Erythema. 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 He has done well with the total contact cast. I recommended continuing the course with Hydrofera Blue and antibiotic ointment under the TCC. Follow-up in 1 week. 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 There was a T Programmer, multimedia Procedure by Bjorn Pippin, Travarius Lange, DO. Post procedure Diagnosis Wound #1: Same as Pre-Procedure Wound #2 Pre-procedure diagnosis of Wound #  2 is an Open Surgical Wound located on the [place] . There was a T Programmer, multimedia Procedure by Enzo Montgomery, DO. Post procedure Diagnosis Wound #2: Same as Pre-Procedure Plan Follow-up Appointments: Return Appointment in 1 week. - Dr. Heber [place] Thursday 03/01/22 @ 9:00 Rm # 7 ***TCC*** Anesthetic: (In clinic) Topical Lidocaine 4% applied to wound bed Bathing/ Shower/ Hygiene: May shower with protection but do not get wound dressing([name]) wet. Protect dressing([name]) with water repellant cover (for example, large plastic bag) or a cast cover and may then take shower. Edema Control - Lymphedema / SCD / Other: Elevate legs to the level of the heart or above for 30 minutes daily and/or when sitting for 3-4 times a day throughout the day. Avoid standing for long periods of time. Off-Loading: Open toe surgical shoe to: - with offloading Peg Assist to aid in offloading pressure to left plantar foot. The following medication([name]) was prescribed:  lidocaine topical 5 % ointment ointment topical once daily for prior to debridement was prescribed at facility WOUND #1: - T Great Wound Laterality: Left oe Cleanser: Wound Cleanser 1 x Per Day/30 Days Discharge Instructions: Cleanse the wound with wound cleanser prior to applying a clean dressing using gauze sponges, not tissue or cotton balls. Cleanser: Byram Ancillary Kit - 15 Day Supply (Generic) 1 x Per Day/30 Days Discharge Instructions: Use supplies as instructed; Kit contains: (15) Saline Bullets; (15) 3x3 Gauze; 15 pr Gloves Topical: Gentamicin 1 x Per Day/30 Days Discharge Instructions: As directed by physician Topical: Mupirocin Ointment 1 x Per Day/30 Days Discharge Instructions: Apply Mupirocin (Bactroban) as instructed Prim Dressing: Hydrofera Blue Ready Transfer Foam, 2.5x2.5 (in/in) (Generic) 1 x Per Day/30 Days ary Discharge Instructions: Apply directly to wound bed as directed Secondary Dressing: Optifoam Non-Adhesive Dressing, 4x4 in (Generic) 1 x Per Day/30 Days Discharge Instructions: Apply x1 layer donut to aid in offloading. Secondary Dressing: Woven Gauze Sponges 2x2 in (Generic) 1 x Per Day/30 Days Discharge Instructions: Apply over primary dressing as directed. Secured With: Child psychotherapist, Sterile 2x75 (in/in) (Generic) 1 x Per Day/30 Days Discharge Instructions: Secure with stretch gauze as directed. Secured With: 70M Medipore H Soft Cloth Surgical T ape, 4 x 10 (in/yd) (Generic) 1 x Per Day/30 Days Discharge Instructions: Secure with tape as directed. WOUND #2: - Foot Wound Laterality: Plantar, Left Cleanser: Wound Cleanser 1 x Per Day/30 Days Discharge Instructions: Cleanse the wound with wound cleanser prior to applying a clean dressing using gauze sponges, not tissue or cotton balls. Cleanser: Byram Ancillary Kit - 15 Day Supply (Generic) 1 x Per Day/30 Days Discharge Instructions: Use supplies as instructed; Kit contains: (15) Saline  Bullets; (15) 3x3 Gauze; 15 pr Gloves Topical: Gentamicin 1 x Per Day/30 Days Discharge Instructions: As directed by physician Topical: Mupirocin Ointment 1 x Per Day/30 Days Discharge Instructions: Apply Mupirocin (Bactroban) as instructed Prim Dressing: Hydrofera Blue Ready Transfer Foam, 2.5x2.5 (in/in) (Generic) 1 x Per Day/30 Days ary Discharge Instructions: Apply directly to wound bed as directed Secondary Dressing: Optifoam Non-Adhesive Dressing, 4x4 in (Generic) 1 x Per Day/30 Days Discharge Instructions: Apply x1 layer donut to aid in offloading. Secondary Dressing: Woven Gauze Sponges 2x2 in (Generic) 1 x Per Day/30 Days Discharge Instructions: Apply over primary dressing as directed. Secured With: Child psychotherapist, Sterile 2x75 (in/in) (Generic) 1 x Per Day/30 Days Discharge Instructions: Secure with stretch gauze as directed. Secured With: 70M Medipore H Soft Cloth Surgical T ape, 4 x 10 (in/yd) (Generic)  1 x Per Day/30 Days Discharge Instructions: Secure with tape as directed. [name] [name], [name] [name] ([id number]) [id number] Page 7 of 9 1. Hydrofera Blue with antibiotic ointment 2. T contact cast placed in standard fashion otal 3. Follow-up in 1 week Electronic Signature([name]) Signed: 02/27/2022 4:03:41 PM By: Kalman Shan DO Entered By: Kalman Shan on 02/27/2022 14:40:54 -------------------------------------------------------------------------------- HxROS Details Patient Name: Date of Service: [name] [name]. 02/27/2022 2:00 PM Medical Record Number: [id number] Patient Account Number: [id number] Date of Birth/Sex: Treating RN: October 28, 1965 (57 y.o. M) Primary Care Provider: [name] Other Clinician: Referring Provider: Treating Provider/Extender: [name] in Treatment: 2 Hematologic/Lymphatic Medical History: Positive for: Lymphedema - pumps use daily Respiratory Medical  History: Positive for: Sleep Apnea - CPAP Cardiovascular Medical History: Positive for: Hypertension; Peripheral Venous Disease - needs ablation on left leg Musculoskeletal Medical History: Positive for: Osteoarthritis Negative for: Osteomyelitis Past Medical History Notes: abscess to left foot 3rd toe 11/28/2022 Neurologic Medical History: Positive for: Neuropathy Immunizations Implantable Devices No devices added Hospitalization / Surgery History Type of Hospitalization/Surgery 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 Family and Social History Heart Disease: Yes - Mother; Lung Disease: Yes - Mother; Current every day smoker - 10 cig a day; Alcohol Use: Never; Drug Use: No History; Caffeine Use: Never; Financial Concerns: No; Food, Clothing or Shelter Needs: No; Support System Lacking: No; Transportation Concerns: No Electronic Signature([name]) Signed: 02/27/2022 4:03:41 PM By: Kalman Shan DO Entered By: Kalman Shan on 02/27/2022 14:39:19 [name] [name], [name] [name] ([id number]) [id number] Page 8 of 9 -------------------------------------------------------------------------------- Total Contact Cast Details Patient Name: Date of Service: [name] [name], [name] [name] 02/27/2022 2:00 PM Medical Record Number: [id number] Patient Account Number: [id number] Date of Birth/Sex: Treating RN: [birth year] (57 y.o. [name] [name] Primary Care Provider: [name] Other Clinician: Referring Provider: Treating Provider/Extender: [name] in Treatment: 2 T Contact Cast Applied for Wound Assessment: otal Wound #1 Left T Great oe Performed By: Physician Kalman Shan, DO Post Procedure Diagnosis Same as Pre-procedure Electronic Signature([name]) Signed: 02/27/2022 4:03:41 PM By: Kalman Shan DO Signed: 02/27/2022 4:05:19 PM By: Sharyn Creamer RN, BSN Entered By: Sharyn Creamer on 02/27/2022  14:36:51 -------------------------------------------------------------------------------- Total Contact Cast Details Patient Name: Date of Service: [name] [name], [name] [name] 02/27/2022 2:00 PM Medical Record Number: [id number] Patient Account Number: [id number] Date of Birth/Sex: Treating RN: [birth year] (57 y.o. [name] [name] Primary Care Provider: [name] Other Clinician: Referring Provider: Treating Provider/Extender: [name] in Treatment: 2 T Contact Cast Applied for Wound Assessment: otal Wound #2 Left,Plantar Foot Performed By: Physician Kalman Shan, DO Post Procedure Diagnosis Same as Pre-procedure Electronic Signature([name]) Signed: 02/27/2022 4:03:41 PM By: Kalman Shan DO Signed: 02/27/2022 4:05:19 PM By: Sharyn Creamer RN, BSN Entered By: Sharyn Creamer on 02/27/2022 14:36:51 -------------------------------------------------------------------------------- [place] Details Patient Name: Date of Service: [name] [name] 02/27/2022 Medical Record Number: [id number] Patient Account Number: [id number] Date of Birth/Sex: Treating RN: 01-13-1966 (57 y.o. 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 G47.33 Obstructive sleep apnea (adult) (pediatric) I87.2 Venous insufficiency (chronic) (peripheral) Facility Procedures : CPT4 Code: [id number] Description: 73419 - APPLY TOTAL CONTACT LEG CAST ICD-10 Diagnosis Description L97.522 Non-pressure chronic ulcer of other part of 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  G90.09 Other idiopathic peripheral  autonomic neuropathy G47.33 Obstructive sleep apnea  (adult) (pediatric) I87.2 Venous insufficiency (chronic) (peripheral) Modifier: Quantity: 1 Physician Procedures : CPT4 Code Description Modifier [id number] - WC PHYS LEVEL 3 - EST PT ICD-10 Diagnosis Description L97.522 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 G47.33  Obstructive sleep apnea (adult) (pediatric) I87.2 Venous insufficiency (chronic) (peripheral) Quantity: 1 : [id number] - WC PHYS APPLY TOTAL CONTACT CAST ICD-10 Diagnosis Description L97.522 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

## 2022-02-28 ENCOUNTER — Ambulatory Visit (HOSPITAL_BASED_OUTPATIENT_CLINIC_OR_DEPARTMENT_OTHER): Payer: BC Managed Care – PPO | Admitting: Internal Medicine

## 2022-03-04 ENCOUNTER — Encounter (HOSPITAL_BASED_OUTPATIENT_CLINIC_OR_DEPARTMENT_OTHER): Payer: BC Managed Care – PPO | Admitting: Internal Medicine

## 2022-03-04 DIAGNOSIS — G629 Polyneuropathy, unspecified: Secondary | ICD-10-CM | POA: Diagnosis not present

## 2022-03-04 DIAGNOSIS — G9009 Other idiopathic peripheral autonomic neuropathy: Secondary | ICD-10-CM

## 2022-03-04 DIAGNOSIS — L97522 Non-pressure chronic ulcer of other part of 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: Secondary | ICD-10-CM

## 2022-03-04 DIAGNOSIS — I872 Venous insufficiency (chronic) (peripheral): Secondary | ICD-10-CM | POA: Diagnosis not present

## 2022-03-04 DIAGNOSIS — L02612 Cutaneous abscess of left foot: Secondary | ICD-10-CM | POA: Diagnosis not present

## 2022-03-04 DIAGNOSIS — M1990 Unspecified osteoarthritis, unspecified site: Secondary | ICD-10-CM | POA: Diagnosis not present

## 2022-03-05 NOTE — Progress Notes (Signed)
[name], [name] ([id number]) [id number] Page 1 of 10 Visit Report for 03/04/2022 Arrival Information Details Patient Name: Date of Service: [name], [name] 03/04/2022 9:00 [place] Record Number: [id number] Patient Account Number: [id number] Date of Birth/Sex: Treating RN: 1965/06/05 (57 y.o. M) Primary Care Beauty Pless: [name] Other Clinician: Referring Jru Pense: Treating Faust Thorington/Extender: [name] in Treatment: 3 Visit Information History Since Last Visit Added or deleted any medications: No Patient Arrived: Ambulatory Any new allergies or adverse reactions: No Arrival Time: 09:09 Had a fall or experienced change in No Accompanied By: self activities of daily living that may affect Transfer Assistance: None risk of falls: Patient Identification Verified: Yes Signs or symptoms of abuse/neglect since last No Secondary Verification Process Completed: Yes visito Patient Requires Transmission-Based No Hospitalized since last visit: No Precautions: Implantable device outside of the clinic No Patient Has Alerts: Yes excluding Patient Alerts: 11/27/21 ABI L 1.26 R1.09 cellular tissue based products placed in the 11/27/2021 TBI L0.72 center R0.66 since last visit: Has Dressing in [place] as Prescribed: Yes Has Footwear/Offloading in Place as Yes Prescribed: Left: Removable Cast Walker/Walking Boot Pain Present Now: No Electronic Signature([name]) Signed: 03/05/2022 4:14:43 PM By: Erenest Blank Entered By: Erenest Blank on 03/04/2022 09:09:59 -------------------------------------------------------------------------------- Encounter Discharge Information Details Patient Name: Date of Service: John Kelp. 03/04/2022 9:00 [place] Record Number: [id number] Patient Account Number: [id number] Date of Birth/Sex: Treating RN: November 02, 1965 (57 y.o. [name] Primary Care Marquavius Scaife: [name] Other  Clinician: Referring Marzetta Lanza: Treating Lakeyia Surber/Extender: [name] in Treatment: 3 Encounter Discharge Information Items Post Procedure Vitals Discharge Condition: Stable Temperature (F): 98.1 Ambulatory Status: Ambulatory Pulse (bpm): 64 Discharge Destination: Home Respiratory Rate (breaths/min): 20 Transportation: Private Auto Blood Pressure (mmHg): 149/82 Accompanied By: self Schedule Follow-up Appointment: Yes Clinical Summary of Care: Electronic Signature([name]) Signed: 03/04/2022 6:17:04 PM By: Deon Pilling RN, BSN Entered By: Deon Pilling on 03/04/2022 10:05:47 [name], [name] ([id number]) [id number] Page 2 of 10 -------------------------------------------------------------------------------- Lower Extremity Assessment Details Patient Name: Date of Service: [name] 03/04/2022 9:00 [place] Record Number: [id number] Patient Account Number: [id number] Date of Birth/Sex: Treating RN: 04/29/1965 (57 y.o. M) Primary Care Iliani Vejar: [name] Other Clinician: Referring Abigael Mogle: Treating Coby Antrobus/Extender: [name]: 3 Edema Assessment Assessed: [Left: No] [Right: No] Edema: [Left: Ye] [Right: [name]] Calf Left: Right: Point of Measurement: 43 cm From Medial Instep 39 cm Ankle Left: Right: Point of Measurement: 11 cm From Medial Instep 27.5 cm Electronic Signature([name]) Signed: 03/05/2022 4:14:43 PM By: Erenest Blank Entered By: Erenest Blank on 03/04/2022 09:24:43 -------------------------------------------------------------------------------- Multi Wound Chart Details Patient Name: Date of Service: John Kelp. 03/04/2022 9:00 A M Medical Record Number: [id number] Patient Account Number: [id number] Date of Birth/Sex: Treating RN: 1965-12-20 (57 y.o. M) Primary Care Lavina Resor: [name] Other Clinician: Referring Marley Charlot: Treating  Luca Dyar/Extender: [name] in Treatment: 3 Vital Signs Height(in): 75 Pulse(bpm): 64 Weight(lbs): 220 Blood Pressure(mmHg): 149/82 Body Mass Index(BMI): 27.5 Temperature(F): 98.1 Respiratory Rate(breaths/min): 18 [1:Photos:] [N/A:N/A] Left T Great oe Left, Plantar Foot N/A Wound Location: Shear/Friction Surgical Injury N/A Wounding Event: Neuropathic Ulcer-Non Diabetic Open Surgical Wound N/A Primary Etiology: Lymphedema, Sleep Apnea, Lymphedema, Sleep Apnea, N/A Comorbid History: Hypertension, Peripheral Venous Hypertension, Peripheral Venous Roggenkamp, [name] ([id number]) [id number] Page 3 of 10 Disease, Osteoarthritis, Neuropathy Disease, Osteoarthritis, Neuropathy 01/28/2022 01/14/2022 N/A Date Acquired: 3 3 N/A Weeks of Treatment: Open Open N/A Wound Status: No No N/A Wound Recurrence:  Yes Yes N/A Pending A mputation on Presentation: 0.6x0.8x0.1 0.7x0.4x0.5 N/A Measurements L x W x D (cm) 0.377 0.22 N/A A (cm) : rea 0.038 0.11 N/A Volume (cm) : 68.80% -86.40% N/A % Reduction in A rea: 68.60% -358.30% N/A % Reduction in Volume: Full Thickness Without Exposed Full Thickness Without Exposed N/A Classification: Support Structures Support Structures Medium Medium N/A Exudate A mount: Serosanguineous Serosanguineous N/A Exudate Type: red, brown red, brown N/A Exudate Color: Distinct, outline attached Distinct, outline attached N/A Wound Margin: Large (67-100%) Large (67-100%) N/A Granulation A mount: Pink Red, Pink N/A Granulation Quality: None Present (0%) None Present (0%) N/A Necrotic A mount: Fat Layer (Subcutaneous Tissue): Yes Fat Layer (Subcutaneous Tissue): Yes N/A Exposed Structures: Fascia: No Fascia: No Tendon: No Tendon: No Muscle: No Muscle: No Joint: No Joint: No Bone: No Bone: No Small (1-33%) None N/A Epithelialization: Debridement - Excisional Debridement -  Excisional N/A Debridement: Pre-procedure Verification/Time Out 09:50 09:50 N/A Taken: Lidocaine 4% Topical Solution Lidocaine 4% Topical Solution N/A Pain Control: Callus, Subcutaneous, Slough Callus, Subcutaneous, Slough N/A Tissue Debrided: Skin/Subcutaneous Tissue Skin/Subcutaneous Tissue N/A Level: 2.25 4 N/A Debridement A (sq cm): rea Curette Curette N/A Instrument: Minimum Minimum N/A Bleeding: Pressure Pressure N/A Hemostasis A chieved: 0 0 N/A Procedural Pain: 0 0 N/A Post Procedural Pain: Procedure was tolerated well Procedure was tolerated well N/A Debridement Treatment Response: 0.6x0.8x0.1 0.7x0.4x0.4 N/A Post Debridement Measurements L x W x D (cm) 0.038 0.088 N/A Post Debridement Volume: (cm) Callus: Yes Callus: Yes N/A Periwound Skin Texture: Excoriation: No Excoriation: No Induration: No Induration: No Crepitus: No Crepitus: No Rash: No Rash: No Scarring: No Scarring: No Maceration: No Maceration: No N/A Periwound Skin Moisture: Dry/Scaly: No Dry/Scaly: No Atrophie Blanche: No Atrophie Blanche: No N/A Periwound Skin Color: Cyanosis: No Cyanosis: No Ecchymosis: No Ecchymosis: No Erythema: No Erythema: No Hemosiderin Staining: No Hemosiderin Staining: No Mottled: No Mottled: No Pallor: No Pallor: No Rubor: No Rubor: No Debridement Debridement N/A Procedures Performed: T Contact Cast otal T Contact Cast otal Treatment Notes Wound #1 (Toe Great) Wound Laterality: Left Cleanser Wound Cleanser Discharge Instruction: Cleanse the wound with wound cleanser prior to applying a clean dressing using gauze sponges, not tissue or cotton balls. Byram Ancillary Kit - 15 Day Supply Discharge Instruction: Use supplies as instructed; Kit contains: (15) Saline Bullets; (15) 3x3 Gauze; 15 pr Gloves Peri-Wound Care Topical Gentamicin Discharge Instruction: As directed by physician Mupirocin Ointment Discharge Instruction: Apply Mupirocin  (Bactroban) as instructed Primary Dressing [facility] Blue Ready Transfer Foam, 2.5x2.5 (in/in) Zartman, Massey [name] ([id number]) [id number] Page 4 of 10 Discharge Instruction: Apply directly to wound bed as directed Secondary Dressing Optifoam Non-Adhesive Dressing, 4x4 in Discharge Instruction: Apply x1 layer donut to aid in offloading. Woven Gauze Sponges 2x2 in Discharge Instruction: Apply over primary dressing as directed. Secured With Conforming Stretch Gauze Bandage, Sterile 2x75 (in/in) Discharge Instruction: Secure with stretch gauze as directed. 28M Medipore H Soft Cloth Surgical T ape, 4 x 10 (in/yd) Discharge Instruction: Secure with tape as directed. Compression Wrap Compression Stockings Add-Ons Wound #2 (Foot) Wound Laterality: Plantar, Left Cleanser Wound Cleanser Discharge Instruction: Cleanse the wound with wound cleanser prior to applying a clean dressing using gauze sponges, not tissue or cotton balls. Byram Ancillary Kit - 15 Day Supply Discharge Instruction: Use supplies as instructed; Kit contains: (15) Saline Bullets; (15) 3x3 Gauze; 15 pr Gloves Peri-Wound Care Topical Gentamicin Discharge Instruction: As directed by physician Mupirocin Ointment Discharge Instruction: Apply Mupirocin (Bactroban) as instructed Primary  Dressing Hydrofera Blue Ready Transfer Foam, 2.5x2.5 (in/in) Discharge Instruction: Apply directly to wound bed as directed Secondary Dressing Optifoam Non-Adhesive Dressing, 4x4 in Discharge Instruction: Apply x1 layer donut to aid in offloading. Woven Gauze Sponges 2x2 in Discharge Instruction: Apply over primary dressing as directed. Secured With Conforming Stretch Gauze Bandage, Sterile 2x75 (in/in) Discharge Instruction: Secure with stretch gauze as directed. 11M Medipore H Soft Cloth Surgical T ape, 4 x 10 (in/yd) Discharge Instruction: Secure with tape as directed. Compression Wrap Compression  Stockings Add-Ons Electronic Signature([name]) Signed: 03/04/2022 10:16:55 AM By: Kalman Shan DO Entered By: Kalman Shan on 03/04/2022 10:11:04 Multi-Disciplinary Care Plan Details -------------------------------------------------------------------------------- John Kelp ([id number]) [id number] Page 5 of 10 Patient Name: Date of Service: MARTON, MALIZIA 03/04/2022 9:00 [place] Record Number: [id number] Patient Account Number: [id number] Date of Birth/Sex: Treating RN: [birth year] (57 y.o. [name] Primary Care Jackilyn Umphlett: [name] Other Clinician: Referring Joane Postel: Treating Laneisha Mino/Extender: [name] in Treatment: 3 Active Inactive Peripheral Neuropathy Nursing Diagnoses: Potential alteration in peripheral tissue perfusion (select prior to confirmation of diagnosis) Goals: Patient/caregiver will verbalize understanding of disease process and disease management Date Initiated: 02/11/2022 Target Resolution Date: 03/28/2022 Goal Status: Active Interventions: Assess signs and symptoms of neuropathy upon admission and as needed Provide education on Management of Neuropathy upon discharge from the [place]: Patient referred for customized footwear/offloading : 02/11/2022 Notes: Wound/Skin Impairment Nursing Diagnoses: Knowledge deficit related to ulceration/compromised skin integrity Goals: Patient/caregiver will verbalize understanding of skin care regimen Date Initiated: 02/11/2022 Target Resolution Date: 03/28/2022 Goal Status: Active Interventions: Assess patient/caregiver ability to perform ulcer/skin care regimen upon admission and as needed Assess ulceration([name]) every visit Provide education on smoking Provide education on ulcer and skin care Treatment Activities: Skin care regimen initiated : 02/11/2022 Smoking cessation education : 02/11/2022 Topical wound  management initiated : 02/11/2022 Notes: Electronic Signature([name]) Signed: 03/04/2022 6:17:04 PM By: Deon Pilling RN, BSN Entered By: Deon Pilling on 03/04/2022 09:57:53 -------------------------------------------------------------------------------- Pain Assessment Details Patient Name: Date of Service: John Kelp. 03/04/2022 9:00 [place] Record Number: [id number] Patient Account Number: [id number] Date of Birth/Sex: Treating RN: 1965/02/13 (57 y.o. M) Primary Care Tina Gruner: [name] Other Clinician: Referring Kadar Chance: Treating Shae Augello/Extender: [name] in Treatment: [address] BLUITT, Shawne [name] ([id number]) [id number] Page 6 of 10 Location of Pain Severity and Description of Pain Patient Has Paino No Site Locations Pain Management and Medication Current Pain Management: Electronic Signature([name]) Signed: 03/05/2022 4:14:43 PM By: Erenest Blank Entered By: Erenest Blank on 03/04/2022 09:10:37 -------------------------------------------------------------------------------- Patient/Caregiver Education Details Patient Name: Date of Service: John Kelp 2/6/2024andnbsp9:00 A M Medical Record Number: [id number] Patient Account Number: [id number] Date of Birth/Gender: Treating RN: July 23, 1965 (57 y.o. [name] Primary Care Physician: [name] Other Clinician: Referring Physician: Treating Physician/Extender: [name] in Treatment: 3 Education Assessment Education Provided To: Patient Education Topics Provided Wound/Skin Impairment: Handouts: Caring for Your Ulcer Methods: Explain/Verbal Responses: Reinforcements needed Electronic Signature([name]) Signed: 03/04/2022 6:17:04 PM By: Deon Pilling RN, BSN Entered By: Deon Pilling on 03/04/2022 10:01:00 Wound Assessment  Details -------------------------------------------------------------------------------- John Kelp ([id number]) [id number] Page 7 of 10 Patient Name: Date of Service: KAELOB, PERSKY 03/04/2022 9:00 [place] Record Number: [id number] Patient Account Number: [id number] Date of Birth/Sex: Treating RN: 08-Apr-1965 (57 y.o. M) Primary Care Justice Milliron: [name] Other Clinician: Referring Namya Voges: Treating Hadasah Brugger/Extender: [name]: 3 Wound Status Wound Number: 1  Primary Neuropathic Ulcer-Non Diabetic Etiology: Wound Location: Left T Great oe Wound Open Wounding Event: Shear/Friction Status: Date Acquired: 01/28/2022 Comorbid Lymphedema, Sleep Apnea, Hypertension, Peripheral Venous Weeks Of Treatment: 3 History: Disease, Osteoarthritis, Neuropathy Clustered Wound: No Pending Amputation On Presentation Photos Wound Measurements Length: (cm) 0.6 % Reduction in Area: 68.8% Width: (cm) 0.8 % Reduction in Volume: 68.6% Depth: (cm) 0.1 Epithelialization: Small (1-33%) Area: (cm) 0.377 Tunneling: No Volume: (cm) 0.038 Undermining: No Wound Description Classification: Full Thickness Without Exposed Support Structures Foul Odor After Cleansing: No Wound Margin: Distinct, outline attached Slough/Fibrino Yes Exudate Amount: Medium Exudate Type: Serosanguineous Exudate Color: red, brown Wound Bed Granulation Amount: Large (67-100%) Exposed Structure Granulation Quality: Pink Fascia Exposed: No Necrotic Amount: None Present (0%) Fat Layer (Subcutaneous Tissue) Exposed: Yes Tendon Exposed: No Muscle Exposed: No Joint Exposed: No Bone Exposed: No Periwound Skin Texture Texture Color No Abnormalities Noted: No No Abnormalities Noted: No Callus: Yes Atrophie Blanche: No Crepitus: No Cyanosis: No Excoriation: No Ecchymosis: No Induration: No Erythema: No Rash: No Hemosiderin  Staining: No Scarring: No Mottled: No Pallor: No Moisture Rubor: No No Abnormalities Noted: No Dry / Scaly: No Maceration: No Treatment Notes Wound #1 (Toe Great) Wound Laterality: Left Cleanser Wound Cleanser Friley, Love [name] ([id number]) [id number] Page 8 of 10 Discharge Instruction: Cleanse the wound with wound cleanser prior to applying a clean dressing using gauze sponges, not tissue or cotton balls. Byram Ancillary Kit - 15 Day Supply Discharge Instruction: Use supplies as instructed; Kit contains: (15) Saline Bullets; (15) 3x3 Gauze; 15 pr Gloves Peri-Wound Care Topical Gentamicin Discharge Instruction: As directed by physician Mupirocin Ointment Discharge Instruction: Apply Mupirocin (Bactroban) as instructed Primary Dressing Hydrofera Blue Ready Transfer Foam, 2.5x2.5 (in/in) Discharge Instruction: Apply directly to wound bed as directed Secondary Dressing Optifoam Non-Adhesive Dressing, 4x4 in Discharge Instruction: Apply x1 layer donut to aid in offloading. Woven Gauze Sponges 2x2 in Discharge Instruction: Apply over primary dressing as directed. Secured With Conforming Stretch Gauze Bandage, Sterile 2x75 (in/in) Discharge Instruction: Secure with stretch gauze as directed. 68M Medipore H Soft Cloth Surgical T ape, 4 x 10 (in/yd) Discharge Instruction: Secure with tape as directed. Compression Wrap Compression Stockings Add-Ons Electronic Signature([name]) Signed: 03/05/2022 4:14:43 PM By: Erenest Blank Entered By: Erenest Blank on 03/04/2022 09:27:19 -------------------------------------------------------------------------------- Wound Assessment Details Patient Name: Date of Service: John Kelp. 03/04/2022 9:00 A M Medical Record Number: [id number] Patient Account Number: [id number] Date of Birth/Sex: Treating RN: 04/29/1965 (57 y.o. M) Primary Care Amberly Livas: [name] Other Clinician: Referring Cobi Aldape: Treating  Malasia Torain/Extender: [name]: 3 Wound Status Wound Number: 2 Primary Open Surgical Wound Etiology: Wound Location: Left, Plantar Foot Wound Open Wounding Event: Surgical Injury Status: Date Acquired: 01/14/2022 Comorbid Lymphedema, Sleep Apnea, Hypertension, Peripheral Venous Weeks Of Treatment: 3 History: Disease, Osteoarthritis, Neuropathy Clustered Wound: No Pending Amputation On Presentation Photos SQUIRE, WITHEY ([id number]) [id number] Page 9 of 10 Wound Measurements Length: (cm) 0.7 Width: (cm) 0.4 Depth: (cm) 0.5 Area: (cm) 0.22 Volume: (cm) 0.11 % Reduction in Area: -86.4% % Reduction in Volume: -358.3% Epithelialization: None Tunneling: No Undermining: No Wound Description Classification: Full Thickness Without Exposed Support Structures Wound Margin: Distinct, outline attached Exudate Amount: Medium Exudate Type: Serosanguineous Exudate Color: red, brown Foul Odor After Cleansing: No Slough/Fibrino Yes Wound Bed Granulation Amount: Large (67-100%) Exposed Structure Granulation Quality: Red, Pink Fascia Exposed: No Necrotic Amount: None Present (0%) Fat Layer (Subcutaneous Tissue) Exposed: Yes Tendon Exposed: No Muscle Exposed: No Joint Exposed: No  Bone Exposed: No Periwound Skin Texture Texture Color No Abnormalities Noted: No No Abnormalities Noted: No Callus: Yes Atrophie Blanche: No Crepitus: No Cyanosis: No Excoriation: No Ecchymosis: No Induration: No Erythema: No Rash: No Hemosiderin Staining: No Scarring: No Mottled: No Pallor: No Moisture Rubor: No No Abnormalities Noted: No Dry / Scaly: No Maceration: No Treatment Notes Wound #2 (Foot) Wound Laterality: Plantar, Left Cleanser Wound Cleanser Discharge Instruction: Cleanse the wound with wound cleanser prior to applying a clean dressing using gauze sponges, not tissue or cotton balls. Byram Ancillary  Kit - 15 Day Supply Discharge Instruction: Use supplies as instructed; Kit contains: (15) Saline Bullets; (15) 3x3 Gauze; 15 pr Gloves Peri-Wound Care Topical Gentamicin Discharge Instruction: As directed by physician Mupirocin Ointment Discharge Instruction: Apply Mupirocin (Bactroban) as instructed Primary Dressing Hydrofera Blue Ready Transfer Foam, 2.5x2.5 (in/in) Discharge Instruction: Apply directly to wound bed as directed Secondary Dressing JOHNELL, BAS [name] ([id number]) [id number] Page 10 of 10 Optifoam Non-Adhesive Dressing, 4x4 in Discharge Instruction: Apply x1 layer donut to aid in offloading. Woven Gauze Sponges 2x2 in Discharge Instruction: Apply over primary dressing as directed. Secured With Conforming Stretch Gauze Bandage, Sterile 2x75 (in/in) Discharge Instruction: Secure with stretch gauze as directed. 3M Medipore H Soft Cloth Surgical T ape, 4 x 10 (in/yd) Discharge Instruction: Secure with tape as directed. Compression Wrap Compression Stockings Add-Ons Electronic Signature([name]) Signed: 03/05/2022 4:14:43 PM By: Erenest Blank Entered By: Erenest Blank on 03/04/2022 09:27:53 -------------------------------------------------------------------------------- Vitals Details Patient Name: Date of Service: John Kelp. 03/04/2022 9:00 A M Medical Record Number: [id number] Patient Account Number: [id number] Date of Birth/Sex: Treating RN: 05-01-1965 (57 y.o. M) Primary Care Kirby Argueta: [name] Other Clinician: Referring Otis Burress: Treating Yecheskel Kurek/Extender: [name] in Treatment: 3 Vital Signs Time Taken: 09:10 Temperature (F): 98.1 Height (in): 75 Pulse (bpm): 64 Weight (lbs): 220 Respiratory Rate (breaths/min): 18 Body Mass Index (BMI): 27.5 Blood Pressure (mmHg): 149/82 Reference Range: 80 - 120 mg / dl Electronic Signature([name]) Signed: 03/05/2022 4:14:43 PM By: Erenest Blank Entered By: Erenest Blank on 03/04/2022 09:10:23

## 2022-03-05 NOTE — Progress Notes (Signed)
DIAR, BERKEL ([id number]) [id number] Page 1 of 10 Visit Report for 03/04/2022 Chief Complaint Document Details Patient Name: Date of Service: [name], [name] 03/04/2022 9:00 [place] Record Number: [id number] Patient Account Number: [id number] Date of Birth/Sex: Treating RN: [birth year] (57 y.o. M) Primary Care Provider: [name] Other Clinician: Referring Provider: Treating Provider/Extender: [name] in Treatment: 3 Information Obtained from: Patient Chief Complaint 02/11/2022 left great toe wound and fourth sub-met head wound Electronic Signature(s) Signed: 03/04/2022 10:16:55 AM By: Kalman Shan DO Entered By: Kalman Shan on 03/04/2022 10:11:11 -------------------------------------------------------------------------------- Debridement Details Patient Name: Date of Service: John Kelp. 03/04/2022 9:00 [place] Record Number: [id number] Patient Account Number: [id number] Date of Birth/Sex: Treating RN: 22-Mar-1965 (57 y.o. [name] Primary Care Provider: [name] Other Clinician: Referring Provider: Treating Provider/Extender: [name] in Treatment: 3 Debridement Performed for Assessment: Wound #2 Left,Plantar Foot Performed By: Physician Kalman Shan, DO Debridement Type: Debridement Level of Consciousness (Pre-procedure): Awake and Alert Pre-procedure Verification/Time Out Yes - 09:50 Taken: Start Time: 09:51 Pain Control: Lidocaine 4% T opical Solution T Area Debrided (L x W): otal 2 (cm) x 2 (cm) = 4 (cm) Tissue and other material debrided: Viable, Non-Viable, Callus, Slough, Subcutaneous, Skin: Dermis , Skin: Epidermis, Slough Level: Skin/Subcutaneous Tissue Debridement Description: Excisional Instrument: Curette Bleeding: Minimum Hemostasis Achieved: Pressure End Time: 10:02 Procedural Pain: 0 Post Procedural  Pain: 0 Response to Treatment: Procedure was tolerated well Level of Consciousness (Post- Awake and Alert procedure): Post Debridement Measurements of Total Wound Length: (cm) 0.7 Width: (cm) 0.4 Depth: (cm) 0.4 Volume: (cm) 0.088 Character of Wound/Ulcer Post Debridement: Improved Post Procedure Diagnosis Mcdonald, Reiling Leldon S ([id number]) [id number] Page 2 of 10 Same as Pre-procedure Electronic Signature(s) Signed: 03/04/2022 10:16:55 AM By: Kalman Shan DO Signed: 03/04/2022 6:17:04 PM By: Deon Pilling RN, BSN Entered By: Deon Pilling on 03/04/2022 10:02:17 -------------------------------------------------------------------------------- Debridement Details Patient Name: Date of Service: John Kelp. 03/04/2022 9:00 [place] Record Number: [id number] Patient Account Number: [id number] Date of Birth/Sex: Treating RN: 1965-09-15 (57 y.o. [name] Primary Care Provider: [name] Other Clinician: Referring Provider: Treating Provider/Extender: [name] in Treatment: 3 Debridement Performed for Assessment: Wound #1 Left T Great oe Performed By: Physician Kalman Shan, DO Debridement Type: Debridement Level of Consciousness (Pre-procedure): Awake and Alert Pre-procedure Verification/Time Out Yes - 09:50 Taken: Start Time: 09:51 Pain Control: Lidocaine 4% T opical Solution T Area Debrided (L x W): otal 1.5 (cm) x 1.5 (cm) = 2.25 (cm) Tissue and other material debrided: Viable, Non-Viable, Callus, Slough, Subcutaneous, Skin: Dermis , Skin: Epidermis, Slough Level: Skin/Subcutaneous Tissue Debridement Description: Excisional Instrument: Curette Bleeding: Minimum Hemostasis Achieved: Pressure End Time: 10:02 Procedural Pain: 0 Post Procedural Pain: 0 Response to Treatment: Procedure was tolerated well Level of Consciousness (Post- Awake and Alert procedure): Post Debridement  Measurements of Total Wound Length: (cm) 0.6 Width: (cm) 0.8 Depth: (cm) 0.1 Volume: (cm) 0.038 Character of Wound/Ulcer Post Debridement: Improved Post Procedure Diagnosis Same as Pre-procedure Electronic Signature(s) Signed: 03/04/2022 10:16:55 AM By: Kalman Shan DO Signed: 03/04/2022 6:17:04 PM By: Deon Pilling RN, BSN Entered By: Deon Pilling on 03/04/2022 10:03:02 -------------------------------------------------------------------------------- HPI Details Patient Name: Date of Service: John Kelp. 03/04/2022 9:00 A M Medical Record Number: [id number] Patient Account Number: [id number] Date of Birth/Sex: Treating RN: April 23, 1965 (57 y.o. M) Primary Care Provider: [name] Other Clinician: MACSEN, NUTTALL ([id number]) [id number] Page 3 of 10 Referring Provider: Treating  Provider/Extender: Ronnald Nian, Ajith Weeks in Treatment: 3 History of Present Illness HPI Description: 02/11/2022 [name] is a 57 year old male with a past medical history of current tobacco user, OSA, venous insufficiency and peripheral neuropathy that presents the clinic for a 2-week history of ulcer to the left great toe and 2-65-monthhistory of nonhealing ulcer to the left plantar foot. [place] day. He wears steel toed boots. He is currently out of work due to his wounds. On 11/26/2021 he was admitted to the hospital for cellulitis and abscess to the left foot. 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 He states he has been on doxycycline for 10 weeks. He just completed this course. He uses regular tennis shoes for walking. He is not offloading the area. He is using Betadine for wound dressings. He has been following with Dr. PPosey Pronto podiatry who Referred to our clinic. 1/23; patient presents for follow-up. He has been using Medihoney and Hydrofera Blue to the wound  beds Along with his surgical shoe and offloading peg assist. He has no issues or complaints today. He denies signs of infection. 1/30; patient presents for follow-up. He has been using Medihoney and Hydrofera Blue to the wound beds. He has no issues or complaints today. Plan is for the total contact cast and patient is agreeable to move forward with this. 2/1; patient presents for follow-up. We have been using antibiotic ointment with Hydrofera Blue to the wound beds under the total contact cast. He presents today for his obligatory cast change. He had no issues with the cast. 2/6; patient presents for follow-up. We have been using antibiotic ointment with Hydrofera Blue to the wound beds under the total contact cast. He has no issues or complaints today. He has tolerated the cast well. Electronic Signature(s) Signed: 03/04/2022 10:16:55 AM By: HKalman ShanDO Entered By: HKalman Shanon 03/04/2022 10:11:37 -------------------------------------------------------------------------------- Physical Exam Details Patient Name: Date of Service: RDAWIT, TANKARD2/06/2022 9:00 A M Medical Record Number: [id number] Account Number: [id number] of Birth/Sex: Treating RN: [birth year](57y.o. M) Primary Care Provider: RMerrilee SeashoreOther Clinician: Referring Provider: Treating Provider/Extender: HWandra Scotin Treatment: 3 Constitutional respirations regular, non-labored and within target range for patient.. Cardiovascular 2+ dorsalis pedis/posterior tibialis pulses. Psychiatric pleasant and cooperative. Notes Left foot: T the plantar aspect of the great toe there is an open wound with granulation tissue, callus and nonviable tissue. T the plantar aspect of the foot o o there is an open wound with granulation tissue and non viable tissue. No signs of surrounding soft tissue infection to any of the wound beds. Electronic Signature(s) Signed:  03/04/2022 10:16:55 AM By: HKalman ShanDO Entered By: HKalman Shanon 03/04/2022 10:12:57 -------------------------------------------------------------------------------- Physician Orders Details Patient Name: Date of Service: RHelene Kelp 03/04/2022 9:00 A M Medical Record Number: [id number] Account Number: [id number] of Birth/Sex: Treating RN: 6January 29, 1967(57y.o. MEmet, Rafanan Cloud S ([id number] Page 4 of 10 Primary Care Provider: RMerrilee SeashoreOther Clinician: Referring Provider: Treating Provider/Extender: HWandra Scotin Treatment: 3 Verbal / Phone Orders: No Diagnosis Coding ICD-10 Coding Code Description 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 G47.33 Obstructive sleep apnea (adult) (pediatric) I87.2 Venous insufficiency (chronic) (peripheral) Follow-up Appointments ppointment in 1 week. - Dr. HHeber CarolinaThursday 03/11/2022 1015  Return A ppointment in 2 weeks. - Dr. Heber [place] Thursday 03/18/2022 0900 overflow room Return A Anesthetic (In clinic) Topical Lidocaine 4% applied to wound bed Bathing/ Shower/ Hygiene May shower with protection but do not get wound dressing(s) wet. Protect dressing(s) with water repellant cover (for example, large plastic bag) or a cast cover and may then take shower. Edema Control - Lymphedema / SCD / Other Elevate legs to the level of the heart or above for 30 minutes daily and/or when sitting for 3-4 times a day throughout the day. Avoid standing for long periods of time. Off-Loading Total Contact Cast to Left Lower Extremity - Size 3 Wound Treatment Wound #1 - T Great oe Wound Laterality: Left Cleanser: Wound Cleanser 1 x Per Week/30 Days Discharge Instructions: Cleanse the wound with wound cleanser prior to applying a clean dressing  using gauze sponges, not tissue or cotton balls. Cleanser: Byram Ancillary Kit - 15 Day Supply (Generic) 1 x Per Week/30 Days Discharge Instructions: Use supplies as instructed; Kit contains: (15) Saline Bullets; (15) 3x3 Gauze; 15 pr Gloves Topical: Gentamicin 1 x Per Week/30 Days Discharge Instructions: As directed by physician Topical: Mupirocin Ointment 1 x Per Week/30 Days Discharge Instructions: Apply Mupirocin (Bactroban) as instructed Prim Dressing: Hydrofera Blue Ready Transfer Foam, 2.5x2.5 (in/in) (Generic) 1 x Per Week/30 Days ary Discharge Instructions: Apply directly to wound bed as directed Secondary Dressing: Optifoam Non-Adhesive Dressing, 4x4 in (Generic) 1 x Per Week/30 Days Discharge Instructions: Apply x1 layer donut to aid in offloading. Secondary Dressing: Woven Gauze Sponges 2x2 in (Generic) 1 x Per Week/30 Days Discharge Instructions: Apply over primary dressing as directed. Secured With: Child psychotherapist, Sterile 2x75 (in/in) (Generic) 1 x Per Week/30 Days Discharge Instructions: Secure with stretch gauze as directed. Secured With: 64M Medipore H Soft Cloth Surgical T ape, 4 x 10 (in/yd) (Generic) 1 x Per Week/30 Days Discharge Instructions: Secure with tape as directed. Wound #2 - Foot Wound Laterality: Plantar, Left Cleanser: Wound Cleanser 1 x Per Week/30 Days Discharge Instructions: Cleanse the wound with wound cleanser prior to applying a clean dressing using gauze sponges, not tissue or cotton balls. Cleanser: Byram Ancillary Kit - 15 Day Supply (Generic) 1 x Per Week/30 Days Discharge Instructions: Use supplies as instructed; Kit contains: (15) Saline Bullets; (15) 3x3 Gauze; 15 pr Gloves Topical: Gentamicin 1 x Per Week/30 Days Discharge Instructions: As directed by physician Topical: Mupirocin Ointment 1 x Per Week/30 Days Discharge Instructions: Apply Mupirocin (Bactroban) as instructed Prim Dressing: Hydrofera Blue Ready Transfer Foam,  2.5x2.5 (in/in) (Generic) 1 x Per Week/30 Days ary Discharge Instructions: Apply directly to wound bed as directed HEBERTO, STURDEVANT ([id number]) [id number] Page 5 of 10 Secondary Dressing: Optifoam Non-Adhesive Dressing, 4x4 in (Generic) 1 x Per Week/30 Days Discharge Instructions: Apply x1 layer donut to aid in offloading. Secondary Dressing: Woven Gauze Sponges 2x2 in (Generic) 1 x Per Week/30 Days Discharge Instructions: Apply over primary dressing as directed. Secured With: Child psychotherapist, Sterile 2x75 (in/in) (Generic) 1 x Per Week/30 Days Discharge Instructions: Secure with stretch gauze as directed. Secured With: 64M Medipore H Soft Cloth Surgical T ape, 4 x 10 (in/yd) (Generic) 1 x Per Week/30 Days Discharge Instructions: Secure with tape as directed. Electronic Signature(s) Signed: 03/04/2022 10:16:55 AM By: Kalman Shan DO Entered By: Kalman Shan on 03/04/2022 10:13:37 -------------------------------------------------------------------------------- Problem List Details Patient Name: Date of Service: John Kelp. 03/04/2022 9:00 [place] Record Number: [id number] Patient Account Number: [id number] Date of  Birth/Sex: Treating RN: 05-Aug-1965 (57 y.o. [name] Primary Care Provider: [name] Other Clinician: Referring Provider: Treating Provider/Extender: [name]: 3 Active Problems ICD-10 Encounter Code Description Active Date MDM Diagnosis [phone] Non-pressure chronic ulcer of other part of 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 02/11/2022 No Yes G90.09 Other idiopathic peripheral autonomic neuropathy 02/11/2022 No Yes G47.33 Obstructive sleep apnea (adult) (pediatric) 02/11/2022 No Yes I87.2 Venous insufficiency (chronic) (peripheral) 02/11/2022 No Yes Inactive Problems Resolved Problems Electronic Signature(s) Signed: 03/04/2022 10:16:55 AM By: Kalman Shan DO Entered By: Kalman Shan on 03/04/2022 10:10:59 Stamant, Nesta S ([id number]) [id number] Page 6 of 10 -------------------------------------------------------------------------------- Progress Note Details Patient Name: Date of Service: BRYN, PERKIN 03/04/2022 9:00 [place] Record Number: [id number] Patient Account Number: [id number] Date of Birth/Sex: Treating RN: 25-Oct-1965 (57 y.o. M) Primary Care Provider: [name] Other Clinician: Referring Provider: Treating Provider/Extender: [name] in Treatment: 3 Subjective Chief Complaint Information obtained from Patient 02/11/2022 left great toe wound and fourth sub-met head wound History of Present Illness (HPI) 02/11/2022 Mr. Ilai Hiller is a 57 year old male with a past medical history of current tobacco user, OSA, venous insufficiency and peripheral neuropathy that presents the clinic for a 2-week history of ulcer to the left great toe and 2-90-monthhistory of nonhealing ulcer to the left plantar foot. He works in a [place] is on [place] feet all day. He wears steel toed boots. He is currently out of work due to his wounds. On 11/26/2021 he was admitted to the hospital for cellulitis and abscess to the left foot. 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 He states he has been on doxycycline for 10 weeks. He just completed this course. He uses regular tennis shoes for walking. He is not offloading the area. He is using Betadine for wound dressings. He has been following with Dr. PPosey Pronto podiatry who Referred to our clinic. 1/23; patient presents for follow-up. He has been using Medihoney and Hydrofera Blue to the wound beds Along with his surgical shoe and offloading peg assist. He has no issues or complaints today. He denies signs of infection. 1/30; patient presents for follow-up. He has been using Medihoney  and Hydrofera Blue to the wound beds. He has no issues or complaints today. Plan is for the total contact cast and patient is agreeable to move forward with this. 2/1; patient presents for follow-up. We have been using antibiotic ointment with Hydrofera Blue to the wound beds under the total contact cast. He presents today for his obligatory cast change. He had no issues with the cast. 2/6; patient presents for follow-up. We have been using antibiotic ointment with Hydrofera Blue to the wound beds under the total contact cast. He has no issues or complaints today. He has tolerated the cast well. Patient History Family History Heart Disease - Mother, Lung Disease - Mother. Social History Current every day smoker - 10 cig a day, Alcohol Use - Never, Drug Use - No History, Caffeine Use - Never. Medical History Hematologic/Lymphatic Patient has history of Lymphedema - pumps use daily Respiratory Patient has history of Sleep Apnea - CPAP Cardiovascular Patient has history of Hypertension, Peripheral Venous Disease - needs ablation on left leg Musculoskeletal Patient has history of Osteoarthritis Denies history of Osteomyelitis Neurologic Patient has history of Neuropathy Hospitalization/Surgery History - 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. Medical A Surgical History Notes nd Musculoskeletal abscess to left foot 3rd  toe 11/28/2022 Objective Constitutional respirations regular, non-labored and within target range for patient.. Vitals Time Taken: 9:10 AM, Height: 75 in, Weight: 220 lbs, BMI: 27.5, Temperature: 98.1 F, Pulse: 64 bpm, Respiratory Rate: 18 breaths/min, Blood Pressure: 149/82 mmHg. Cardiovascular 2+ dorsalis pedis/posterior tibialis pulses. Psychiatric DAEMON, DOWTY ([id number]) [id number] Page 7 of 10 pleasant and cooperative. General Notes: Left foot: T the plantar aspect of the great toe there is an open wound with granulation  tissue, callus and nonviable tissue. T the plantar o o aspect of the foot there is an open wound with granulation tissue and non viable tissue. No signs of surrounding soft tissue infection to any of the wound beds. Integumentary (Hair, Skin) Wound #1 status is Open. Original cause of wound was Shear/Friction. The date acquired was: 01/28/2022. The wound has been in treatment 3 weeks. The wound is located on the Left T Great. The wound measures 0.6cm length x 0.8cm width x 0.1cm depth; 0.377cm^2 area and 0.038cm^3 volume. There is Fat Layer oe (Subcutaneous Tissue) exposed. There is no tunneling or undermining noted. There is a medium amount of serosanguineous drainage noted. The wound margin is distinct with the outline attached to the wound base. There is large (67-100%) pink granulation within the wound bed. There is no necrotic tissue within the wound bed. The periwound skin appearance exhibited: Callus. The periwound skin appearance did not exhibit: Crepitus, Excoriation, Induration, Rash, Scarring, Dry/Scaly, Maceration, Atrophie Blanche, Cyanosis, Ecchymosis, Hemosiderin Staining, Mottled, Pallor, Rubor, Erythema. Wound #2 status is Open. Original cause of wound was Surgical Injury. The date acquired was: 01/14/2022. The wound has been in treatment 3 weeks. The wound is located on the [place]. The wound measures 0.7cm length x 0.4cm width x 0.5cm depth; 0.22cm^2 area and 0.11cm^3 volume. There is Fat Layer (Subcutaneous Tissue) exposed. There is no tunneling or undermining noted. There is a medium amount of serosanguineous drainage noted. The wound margin is distinct with the outline attached to the wound base. There is large (67-100%) red, pink granulation within the wound bed. There is no necrotic tissue within the wound bed. The periwound skin appearance exhibited: Callus. The periwound skin appearance did not exhibit: Crepitus, Excoriation, Induration, Rash, Scarring, Dry/Scaly,  Maceration, Atrophie Blanche, Cyanosis, Ecchymosis, Hemosiderin Staining, Mottled, Pallor, Rubor, Erythema. 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 I debrided nonviable tissue. I recommended continuing the course with antibiotic ointment and Hydrofera Blue under the total contact cast. Follow-up in 1 week. 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 There was a Excisional Skin/Subcutaneous Tissue oe Debridement with a total area of 2.25 sq cm performed by Kalman Shan, DO. With the following instrument(s): Curette to remove Viable and Non-Viable tissue/material. Material removed includes Callus, Subcutaneous Tissue, Slough, Skin: Dermis, and Skin: Epidermis after achieving pain control using Lidocaine 4% T opical Solution. A time out was conducted at 09:50, prior to the start of the procedure. A Minimum amount of bleeding was controlled with Pressure. The procedure was tolerated well with a pain level of 0 throughout and a pain level of 0 following the procedure. Post Debridement Measurements: 0.6cm length x 0.8cm width x 0.1cm depth; 0.038cm^3 volume. Character of Wound/Ulcer Post Debridement is improved. Post procedure Diagnosis Wound #1: Same as Pre-Procedure Pre-procedure diagnosis of  Wound #1 is a Neuropathic Ulcer-Non Diabetic located on the Left T Great . There was a T Programmer, multimedia Procedure by Bjorn Pippin, Michaela Broski, DO. Post procedure Diagnosis Wound #1: Same as Pre-Procedure Wound #2 Pre-procedure diagnosis of Wound #2 is an Open Surgical Wound located on the [place] . There was a Excisional Skin/Subcutaneous Tissue Debridement with a total area  of 4 sq cm performed by Kalman Shan, DO. With the following instrument(s): Curette to remove Viable and Non-Viable tissue/material. Material removed includes Callus, Subcutaneous Tissue, Slough, Skin: Dermis, and Skin: Epidermis after achieving pain control using Lidocaine 4% T opical Solution. A time out was conducted at 09:50, prior to the start of the procedure. A Minimum amount of bleeding was controlled with Pressure. The procedure was tolerated well with a pain level of 0 throughout and a pain level of 0 following the procedure. Post Debridement Measurements: 0.7cm length x 0.4cm width x 0.4cm depth; 0.088cm^3 volume. Character of Wound/Ulcer Post Debridement is improved. Post procedure Diagnosis Wound #2: Same as Pre-Procedure Pre-procedure diagnosis of Wound #2 is an Open Surgical Wound located on the Left,Plantar Foot . There was a T Programmer, multimedia Procedure by Enzo Montgomery, DO. Post procedure Diagnosis Wound #2: Same as Pre-Procedure Plan Follow-up Appointments: Return Appointment in 1 week. - Dr. Heber Lueders Thursday 03/11/2022 1015 Return Appointment in 2 weeks. - Dr. Heber Cunningham Thursday 03/18/2022 0900 overflow room Anesthetic: (In clinic) Topical Lidocaine 4% applied to wound bed Bathing/ Shower/ Hygiene: LASHAN, GLUTH ([id number]) [id number] Page 8 of 10 May shower with protection but do not get wound dressing(s) wet. Protect dressing(s) with water repellant cover (for example, large plastic bag) or a cast cover and may then take shower. Edema Control - Lymphedema / SCD / Other: Elevate legs to the level of the heart or above for 30 minutes daily and/or when sitting for 3-4 times a day throughout the day. Avoid standing for long periods of time. Off-Loading: T Contact Cast to Left Lower Extremity - Size 3 otal WOUND #1: - T Great Wound Laterality: Left oe Cleanser: Wound Cleanser 1 x Per Week/30 Days Discharge Instructions: Cleanse the  wound with wound cleanser prior to applying a clean dressing using gauze sponges, not tissue or cotton balls. Cleanser: Byram Ancillary Kit - 15 Day Supply (Generic) 1 x Per Week/30 Days Discharge Instructions: Use supplies as instructed; Kit contains: (15) Saline Bullets; (15) 3x3 Gauze; 15 pr Gloves Topical: Gentamicin 1 x Per Week/30 Days Discharge Instructions: As directed by physician Topical: Mupirocin Ointment 1 x Per Week/30 Days Discharge Instructions: Apply Mupirocin (Bactroban) as instructed Prim Dressing: Hydrofera Blue Ready Transfer Foam, 2.5x2.5 (in/in) (Generic) 1 x Per Week/30 Days ary Discharge Instructions: Apply directly to wound bed as directed Secondary Dressing: Optifoam Non-Adhesive Dressing, 4x4 in (Generic) 1 x Per Week/30 Days Discharge Instructions: Apply x1 layer donut to aid in offloading. Secondary Dressing: Woven Gauze Sponges 2x2 in (Generic) 1 x Per Week/30 Days Discharge Instructions: Apply over primary dressing as directed. Secured With: Child psychotherapist, Sterile 2x75 (in/in) (Generic) 1 x Per Week/30 Days Discharge Instructions: Secure with stretch gauze as directed. Secured With: 80M Medipore H Soft Cloth Surgical T ape, 4 x 10 (in/yd) (Generic) 1 x Per Week/30 Days Discharge Instructions: Secure with tape as directed. WOUND #2: - Foot Wound Laterality: Plantar, Left Cleanser: Wound Cleanser 1 x Per Week/30 Days Discharge Instructions: Cleanse the wound with wound cleanser prior to applying a clean dressing using gauze sponges, not tissue  or cotton balls. Cleanser: [place] Ancillary Kit - 15 Day Supply (Generic) 1 x Per Week/30 Days Discharge Instructions: Use supplies as instructed; Kit contains: (15) Saline Bullets; (15) 3x3 Gauze; 15 pr Gloves Topical: Gentamicin 1 x Per Week/30 Days Discharge Instructions: As directed by physician Topical: Mupirocin Ointment 1 x Per Week/30 Days Discharge Instructions: Apply Mupirocin (Bactroban) as  instructed Prim Dressing: Hydrofera Blue Ready Transfer Foam, 2.5x2.5 (in/in) (Generic) 1 x Per Week/30 Days ary Discharge Instructions: Apply directly to wound bed as directed Secondary Dressing: Optifoam Non-Adhesive Dressing, 4x4 in (Generic) 1 x Per Week/30 Days Discharge Instructions: Apply x1 layer donut to aid in offloading. Secondary Dressing: Woven Gauze Sponges 2x2 in (Generic) 1 x Per Week/30 Days Discharge Instructions: Apply over primary dressing as directed. Secured With: Child psychotherapist, Sterile 2x75 (in/in) (Generic) 1 x Per Week/30 Days Discharge Instructions: Secure with stretch gauze as directed. Secured With: 21M Medipore H Soft Cloth Surgical T ape, 4 x 10 (in/yd) (Generic) 1 x Per Week/30 Days Discharge Instructions: Secure with tape as directed. 1. In office sharp debridement 2. Hydrofera Blue and antibiotic ointment 3. T contact cast placed in standard fashion otal 4. Follow-up in 1 week Electronic Signature(s) Signed: 03/04/2022 10:16:55 AM By: Kalman Shan DO Entered By: Kalman Shan on 03/04/2022 10:16:27 -------------------------------------------------------------------------------- HxROS Details Patient Name: Date of Service: John Kelp. 03/04/2022 9:00 A M Medical Record Number: [id number] Patient Account Number: [id number] Date of Birth/Sex: Treating RN: 1965-02-18 (56 y.o. M) Primary Care Provider: [name] Other Clinician: Referring Provider: Treating Provider/Extender: [name] in Treatment: 3 Hematologic/Lymphatic Medical History: Positive for: Lymphedema - pumps use daily Respiratory Medical History: Positive for: Sleep Apnea - CPAP Wist, Merino ([id number]) [id number] Page 9 of 10 Cardiovascular Medical History: Positive for: Hypertension; Peripheral Venous Disease - needs ablation on left leg Musculoskeletal Medical  History: Positive for: Osteoarthritis Negative for: Osteomyelitis Past Medical History Notes: abscess to left foot 3rd toe 11/28/2022 Neurologic Medical History: Positive for: Neuropathy Immunizations Implantable Devices No devices added Hospitalization / Surgery History Type of Hospitalization/Surgery 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 Family and Social History Heart Disease: Yes - Mother; Lung Disease: Yes - Mother; Current every day smoker - 10 cig a day; Alcohol Use: Never; Drug Use: No History; Caffeine Use: Never; Financial Concerns: No; Food, Clothing or Shelter Needs: No; Support System Lacking: No; Transportation Concerns: No Electronic Signature(s) Signed: 03/04/2022 10:16:55 AM By: Kalman Shan DO Entered By: Kalman Shan on 03/04/2022 10:11:42 -------------------------------------------------------------------------------- Total Contact Cast Details Patient Name: Date of Service: PARRY, PO 03/04/2022 9:00 A M Medical Record Number: [id number] Patient Account Number: [id number] Date of Birth/Sex: Treating RN: Jun 30, 1965 (57 y.o. [name] Primary Care Provider: [name] Other Clinician: Referring Provider: Treating Provider/Extender: [name] in Treatment: 3 T Contact Cast Applied for Wound Assessment: otal Wound #2 Left,Plantar Foot Performed By: Physician Kalman Shan, DO Post Procedure Diagnosis Same as Pre-procedure Electronic Signature(s) Signed: 03/04/2022 10:16:55 AM By: Kalman Shan DO Signed: 03/04/2022 6:17:04 PM By: Deon Pilling RN, BSN Entered By: Deon Pilling on 03/04/2022 10:03:15 Total Contact Cast Details -------------------------------------------------------------------------------- John Kelp ([id number]) [id number] Page 10 of 10 Patient Name: Date of Service: ZACHARIAH, PAVEK 03/04/2022 9:00 Linda Record Number:  [id number] Patient Account Number: [id number] Date of Birth/Sex: Treating RN: 1965/08/16 (57 y.o. [name] Primary Care Provider: [name] Other Clinician: Referring Provider: Treating Provider/Extender: [name]:  3 T Contact Cast Applied for Wound Assessment: otal Wound #1 Left T [place] o[place] Performed By: Physician Kalman Shan, DO Post Procedure Diagnosis Same as Pre-procedure Electronic Signature(s) Signed: 03/04/2022 10:16:55 AM By: Kalman Shan DO Signed: 03/04/2022 6:17:04 PM By: Deon Pilling RN, BSN Entered By: Deon Pilling on 03/04/2022 10:03:15 -------------------------------------------------------------------------------- SuperBill Details Patient Name: Date of Service: John Kelp 03/04/2022 Medical Record Number: [id number] Patient Account Number: [id number] Date of Birth/Sex: Treating RN: [birth year] (57 y.o. 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

## 2022-03-11 ENCOUNTER — Encounter (HOSPITAL_BASED_OUTPATIENT_CLINIC_OR_DEPARTMENT_OTHER): Payer: BC Managed Care – PPO | Admitting: Internal Medicine

## 2022-03-11 DIAGNOSIS — M1990 Unspecified osteoarthritis, unspecified site: Secondary | ICD-10-CM | POA: Diagnosis not present

## 2022-03-11 DIAGNOSIS — I872 Venous insufficiency (chronic) (peripheral): Secondary | ICD-10-CM | POA: Diagnosis not present

## 2022-03-11 DIAGNOSIS — L97522 Non-pressure chronic ulcer of other part of 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: Secondary | ICD-10-CM | POA: Diagnosis not present

## 2022-03-11 DIAGNOSIS — L02612 Cutaneous abscess of left foot: Secondary | ICD-10-CM | POA: Diagnosis not present

## 2022-03-11 DIAGNOSIS — G629 Polyneuropathy, unspecified: Secondary | ICD-10-CM | POA: Diagnosis not present

## 2022-03-11 DIAGNOSIS — G9009 Other idiopathic peripheral autonomic neuropathy: Secondary | ICD-10-CM | POA: Diagnosis not present

## 2022-03-13 NOTE — Progress Notes (Signed)
NAASON, CUFFEE [name] ([id number]) [id number] Page 1 of 10 Visit Report for 03/11/2022 Arrival Information Details Patient Name: Date of Service: [name], [name] 03/11/2022 10:15 A M Medical Record Number: [id number] Patient Account Number: [id number] Date of Birth/Sex: Treating RN: 1966-01-06 (57 y.o. [name], [name] Primary Care Alphonzo Devera: [name] Other Clinician: Referring Alexcis Bicking: Treating Ardenia Stiner/Extender: [name] in Treatment: 4 Visit Information History Since Last Visit Added or deleted any medications: No Patient Arrived: Ambulatory Any new allergies or adverse reactions: No Arrival Time: 10:33 Had a fall or experienced change in No Accompanied By: self activities of daily living that may affect Transfer Assistance: None risk of falls: Patient Requires Transmission-Based No Signs or symptoms of abuse/neglect since last visito No Precautions: Hospitalized since last visit: No Patient Has Alerts: Yes Implantable device outside of the clinic excluding No Patient Alerts: 11/27/21 ABI L 1.26 R1.09 cellular tissue based products placed in the center 11/27/2021 TBI L0.72 since last visit: R0.66 Has Dressing in Place as Prescribed: Yes Has Footwear/Offloading in Place as Prescribed: Yes Left: T Contact Cast otal Pain Present Now: No Electronic Signature([name]) Signed: 03/12/2022 4:47:24 PM By: Rhae Hammock RN Entered By: Rhae Hammock on 03/11/2022 10:34:22 -------------------------------------------------------------------------------- Encounter Discharge Information Details Patient Name: Date of Service: John Kelp. 03/11/2022 10:15 A M Medical Record Number: [id number] Patient Account Number: [id number] Date of Birth/Sex: Treating RN: 03/28/1965 (57 y.o. [name], [name] Primary Care Diavion Labrador: [name] Other Clinician: Referring Garielle Mroz: Treating Gladyce Mcray/Extender:  [name] in Treatment: 4 Encounter Discharge Information Items Post Procedure Vitals Discharge Condition: Stable Temperature (F): 98.7 Ambulatory Status: Ambulatory Pulse (bpm): 74 Discharge Destination: Home Respiratory Rate (breaths/min): 17 Transportation: Private Auto Blood Pressure (mmHg): 120/80 Accompanied By: self Schedule Follow-up Appointment: Yes Clinical Summary of Care: Patient Declined Electronic Signature([name]) Signed: 03/12/2022 4:47:24 PM By: Rhae Hammock RN Entered By: Rhae Hammock on 03/11/2022 11:07:39 [name], [name] ([id number].pdf Page 2 of 10 -------------------------------------------------------------------------------- Lower Extremity Assessment Details Patient Name: Date of Service: [name], [name] 03/11/2022 10:15 A M Medical Record Number: [id number] Patient Account Number: [id number] Date of Birth/Sex: Treating RN: [birth year] (57 y.o. [name], [name] Primary Care Abdulkarim Eberlin: [name] Other Clinician: Referring Amaris Garrette: Treating Dara Beidleman/Extender: [name]: 4 Edema Assessment Assessed: [name]: Yes] [name]: No] Edema: [Left: Ye] [Right: [name]] Calf Left: Right: Point of Measurement: 43 cm From Medial Instep 39 cm Ankle Left: Right: Point of Measurement: 11 cm From Medial Instep 27.5 cm Vascular Assessment Pulses: Dorsalis Pedis Palpable: [Left:Yes] Posterior Tibial Palpable: [Left:Yes] Electronic Signature([name]) Signed: 03/12/2022 4:47:24 PM By: Rhae Hammock RN Entered By: Rhae Hammock on 03/11/2022 10:35:18 -------------------------------------------------------------------------------- Multi Wound Chart Details Patient Name: Date of Service: John Kelp 03/11/2022 10:15 A M Medical Record Number: [id number] Patient Account Number: [id number] Date of Birth/Sex: Treating RN: 04-01-1965 (57  y.o. M) Primary Care Jerrod Damiano: [name] Other Clinician: Referring Arlyne Brandes: Treating Treena Cosman/Extender: [name] in Treatment: 4 Vital Signs Height(in): 75 Pulse(bpm): 74 Weight(lbs): 220 Blood Pressure(mmHg): 132/84 Body Mass Index(BMI): 27.5 Temperature(F): 99.1 Respiratory Rate(breaths/min): 17 [1:Photos:] [N/A:N/A] PAX, MONNIER ([id number]) [1:Left T Great oe Wound Location: Shear/Friction Wounding Event: Neuropathic Ulcer-Non Diabetic Primary Etiology: Lymphedema, Sleep Apnea, Comorbid History: Hypertension, Peripheral Venous Disease, Osteoarthritis, Neuropathy 01/28/2022 Date Acquired: 4  Weeks of Treatment: Open Wound Status: No Wound Recurrence: Yes Pending A mputation on Presentation: 0.2x0.2x0.1 Measurements L x W x D (cm) 0.031 A (cm) : rea 0.003 Volume (cm) : 97.40% %  Reduction in A rea: 97.50% % Reduction in Volume: 12 Starting  Position 1 (o'clock): 12 Ending Position 1 (o'clock): 0.4 Maximum Distance 1 (cm): Yes Undermining: Full Thickness Without Exposed Classification: Support Structures Medium Exudate A mount: Serosanguineous Exudate Type: red, brown Exudate Color:  Distinct, outline attached Wound Margin: Large (67-100%) Granulation A mount: Pink Granulation Quality: None Present (0%) Necrotic A mount: Fat Layer (Subcutaneous Tissue): Yes Fat Layer (Subcutaneous Tissue): Yes N/A Exposed Structures: Fascia: No  Tendon: No Muscle: No Joint: No Bone: No Large (67-100%) Epithelialization: Debridement - Selective/Open Wound Debridement - Selective/Open Wound N/A Debridement: Pre-procedure Verification/Time Out 11:00 Taken: Lidocaine Pain Control: Skin/Epidermis  Level: 0.25 Debridement A (sq cm): rea Curette Instrument: Minimum Bleeding: Silver Nitrate Hemostasis A chieved: 0 Procedural Pain: 0 Post Procedural Pain: Procedure was tolerated well Debridement Treatment Response: 0.2x0.2x0.1 Post Debridement  Measurements L x W x D  (cm) 0.003 Post Debridement Volume: (cm) Callus: Yes Periwound Skin Texture: Excoriation: No Induration: No Crepitus: No Rash: No Scarring: No Maceration: No Periwound Skin Moisture: Dry/Scaly: No Atrophie Blanche: No Periwound  Skin Color: Cyanosis: No Ecchymosis: No Erythema: No Hemosiderin Staining: No Mottled: No Pallor: No Rubor: No No Abnormality Temperature: Debridement Procedures Performed:] [2:Left, Plantar Foot Surgical Injury Open Surgical Wound Lymphedema, Sleep  Apnea, Hypertension, Peripheral Venous Disease, Osteoarthritis, Neuropathy 01/14/2022 4 Open No Yes 0.7x0.4x0.8 0.22 0.176 -86.40% -633.30% 12 12 0.4 Yes Full Thickness Without Exposed Support Structures Medium Serosanguineous red, brown Distinct,  outline attached Large (67-100%) Red, Pink None Present (0%) Fascia: No Tendon: No Muscle: No Joint: No Bone: No None 11:00 Lidocaine Skin/Epidermis 0.28 Curette Minimum Silver Nitrate 0 0 Procedure was tolerated well 0.7x0.4x0.8 0.176 Callus: Yes  Excoriation: No Induration: No Crepitus: No Rash: No Scarring: No Maceration: No Dry/Scaly: No Atrophie Blanche: No Cyanosis: No Ecchymosis: No Erythema: No Hemosiderin Staining: No Mottled: No Pallor: No Rubor: No N/A Debridement]  [[id number] Page 3 of 10 N/A N/A N/A N/A N/A N/A N/A N/A N/A N/A N/A N/A N/A N/A N/A N/A N/A N/A N/A N/A N/A N/A N/A N/A N/A N/A N/A N/A N/A N/A N/A N/A N/A N/A N/A N/A N/A N/A N/A N/A N/A] Treatment Notes Wound #1 (Toe Great) Wound Laterality: Left Cleanser Wound Cleanser Discharge Instruction: Cleanse the wound with wound cleanser prior to applying a clean dressing using gauze sponges, not tissue or cotton balls. Byram Ancillary Kit - 15 Day Supply Discharge Instruction: Use supplies as instructed; Kit contains: (15) Saline Bullets; (15) 3x3 Gauze; 15 pr [place], [place] ([id number]) [phone].pdf Page 4 of  10 Gentamicin Discharge Instruction: As directed by physician Mupirocin Ointment Discharge Instruction: Apply Mupirocin (Bactroban) as instructed Primary Dressing Hydrofera Blue Ready Transfer Foam, 2.5x2.5 (in/in) Discharge Instruction: Apply directly to wound bed as directed Secondary Dressing Optifoam Non-Adhesive Dressing, 4x4 in Discharge Instruction: Apply x1 layer donut to aid in offloading. Woven Gauze Sponges 2x2 in Discharge Instruction: Apply over primary dressing as directed. Secured With Conforming Stretch Gauze Bandage, Sterile 2x75 (in/in) Discharge Instruction: Secure with stretch gauze as directed. 81M Medipore H Soft Cloth Surgical T ape, 4 x 10 (in/yd) Discharge Instruction: Secure with tape as directed. Compression Wrap Compression Stockings Add-Ons Wound #2 (Foot) Wound Laterality: Plantar, Left Cleanser Wound Cleanser Discharge Instruction: Cleanse the wound with wound cleanser prior to applying a clean dressing using gauze sponges, not tissue or cotton balls. Byram Ancillary Kit - 15 Day Supply Discharge Instruction: Use supplies as instructed; Kit contains: (15) Saline Bullets; (15)  3x3 Gauze; 15 pr Gloves Peri-Wound Care Topical Gentamicin Discharge Instruction: As directed by physician Mupirocin Ointment Discharge Instruction: Apply Mupirocin (Bactroban) as instructed Primary Dressing Hydrofera Blue Ready Transfer Foam, 2.5x2.5 (in/in) Discharge Instruction: Apply directly to wound bed as directed Secondary Dressing Optifoam Non-Adhesive Dressing, 4x4 in Discharge Instruction: Apply x1 layer donut to aid in offloading. Woven Gauze Sponges 2x2 in Discharge Instruction: Apply over primary dressing as directed. Secured With Conforming Stretch Gauze Bandage, Sterile 2x75 (in/in) Discharge Instruction: Secure with stretch gauze as directed. 62M Medipore H Soft Cloth Surgical T ape, 4 x 10 (in/yd) Discharge Instruction: Secure with tape as  directed. Compression Wrap Compression Stockings Add-Ons Electronic Signature([name]) Signed: 03/11/2022 11:58:04 AM By: Kalman Shan DO Entered By: Kalman Shan on 03/11/2022 11:12:02 Krieger, Detravion [name] ([id number].pdf Page 5 of 10 -------------------------------------------------------------------------------- Multi-Disciplinary Care Plan Details Patient Name: Date of Service: [name], [name] 03/11/2022 10:15 A M Medical Record Number: [id number] Patient Account Number: [id number] Date of Birth/Sex: Treating RN: 08-26-1965 (57 y.o. [name], [name] Primary Care Gwendalynn Eckstrom: [name] Other Clinician: Referring Khalee Mazo: Treating Annmarie Plemmons/Extender: [name] in Treatment: 4 Active Inactive Peripheral Neuropathy Nursing Diagnoses: Potential alteration in peripheral tissue perfusion (select prior to confirmation of diagnosis) Goals: Patient/caregiver will verbalize understanding of disease process and disease management Date Initiated: 02/11/2022 Target Resolution Date: 03/28/2022 Goal Status: Active Interventions: Assess signs and symptoms of neuropathy upon admission and as needed Provide education on Management of Neuropathy upon discharge from the [facility]: Patient referred for customized footwear/offloading : 02/11/2022 Notes: Wound/Skin Impairment Nursing Diagnoses: Knowledge deficit related to ulceration/compromised skin integrity Goals: Patient/caregiver will verbalize understanding of skin care regimen Date Initiated: 02/11/2022 Target Resolution Date: 03/28/2022 Goal Status: Active Interventions: Assess patient/caregiver ability to perform ulcer/skin care regimen upon admission and as needed Assess ulceration([name]) every visit Provide education on smoking Provide education on ulcer and skin care Treatment Activities: Skin care regimen initiated : 02/11/2022 Smoking  cessation education : 02/11/2022 Topical wound management initiated : 02/11/2022 Notes: Electronic Signature([name]) Signed: 03/12/2022 4:47:24 PM By: Rhae Hammock RN Entered By: Rhae Hammock on 03/11/2022 10:52:15 -------------------------------------------------------------------------------- Pain Assessment Details Patient Name: Date of Service: [name], [name] 03/11/2022 10:15 A Rulon Eisenmenger, Jb [name] ([id number].pdf Page 6 of 10 Medical Record Number: [id number] Patient Account Number: [id number] Date of Birth/Sex: Treating RN: Nov 22, 1965 (57 y.o. [name], [name] Primary Care Tori Cupps: [name] Other Clinician: Referring Griffin Gerrard: Treating Franko Hilliker/Extender: [name]: 4 Active Problems Location of Pain Severity and Description of Pain Patient Has Paino No Site Locations Pain Management and Medication Current Pain Management: Electronic Signature([name]) Signed: 03/12/2022 4:47:24 PM By: Rhae Hammock RN Entered By: Rhae Hammock on 03/11/2022 10:35:10 -------------------------------------------------------------------------------- Patient/Caregiver Education Details Patient Name: Date of Service: John Kelp 2/13/2024andnbsp10:15 [place] Record Number: [id number] Patient Account Number: [id number] Date of Birth/Gender: Treating RN: March 01, 1965 (57 y.o. [place] Noe Primary Care Physician: [name] Other Clinician: Referring Physician: Treating Physician/Extender: [name] in Treatment: 4 Education Assessment Education Provided To: Patient Education Topics Provided Wound/Skin Impairment: Methods: Explain/Verbal Responses: Reinforcements needed, State content correctly Electronic Signature([name]) Signed: 03/12/2022 4:47:24 PM By: Rhae Hammock RN Entered By: Rhae Hammock on 03/11/2022 10:52:26 [name],  [name] ([id number] Page 7 of 10 -------------------------------------------------------------------------------- Wound Assessment Details Patient Name: Date of Service: [name], [name] 03/11/2022 10:15 A M Medical Record Number: [id number] Patient Account Number: [id number] Date of Birth/Sex: Treating RN: [birth year] (57 y.o. M)  Rhae Hammock Primary Care Chrissie Dacquisto: [name] Other Clinician: Referring Zyrion Coey: Treating Eh Sesay/Extender: [name]: 4 Wound Status Wound Number: 1 Primary Neuropathic Ulcer-Non Diabetic Etiology: Wound Location: Left T Great oe Wound Open Wounding Event: Shear/Friction Status: Date Acquired: 01/28/2022 Comorbid Lymphedema, Sleep Apnea, Hypertension, Peripheral Venous Weeks Of Treatment: 4 History: Disease, Osteoarthritis, Neuropathy Clustered Wound: No Pending Amputation On Presentation Photos Wound Measurements Length: (cm) 0.2 Width: (cm) 0.2 Depth: (cm) 0.1 Area: (cm) 0.031 Volume: (cm) 0.003 % Reduction in Area: 97.4% % Reduction in Volume: 97.5% Epithelialization: Large (67-100%) Tunneling: No Undermining: Yes Starting Position (o'clock): 12 Ending Position (o'clock): 12 Maximum Distance: (cm) 0.4 Wound Description Classification: Full Thickness Without Exposed Suppor Wound Margin: Distinct, outline attached Exudate Amount: Medium Exudate Type: Serosanguineous Exudate Color: red, brown t Structures Foul Odor After Cleansing: No Slough/Fibrino Yes Wound Bed Granulation Amount: Large (67-100%) Exposed Structure Granulation Quality: Pink Fascia Exposed: No Necrotic Amount: None Present (0%) Fat Layer (Subcutaneous Tissue) Exposed: Yes Tendon Exposed: No Muscle Exposed: No Joint Exposed: No Bone Exposed: No Periwound Skin Texture Texture Color No Abnormalities Noted: No No Abnormalities Noted: No Callus: Yes Atrophie  Blanche: No Crepitus: No Cyanosis: No Excoriation: No Ecchymosis: No Induration: No Erythema: No Rash: No Hemosiderin Staining: No Scarring: No Mottled: No Pallor: No VOGAN, Rikki [name] ([id number].pdf Page 8 of 10 Pallor: No Moisture Rubor: No No Abnormalities Noted: No Dry / Scaly: No Temperature / Pain Maceration: No Temperature: No Abnormality Treatment Notes Wound #1 (Toe Great) Wound Laterality: Left Cleanser Wound Cleanser Discharge Instruction: Cleanse the wound with wound cleanser prior to applying a clean dressing using gauze sponges, not tissue or cotton balls. Byram Ancillary Kit - 15 Day Supply Discharge Instruction: Use supplies as instructed; Kit contains: (15) Saline Bullets; (15) 3x3 Gauze; 15 pr Gloves Peri-Wound Care Topical Gentamicin Discharge Instruction: As directed by physician Mupirocin Ointment Discharge Instruction: Apply Mupirocin (Bactroban) as instructed Primary Dressing Hydrofera Blue Ready Transfer Foam, 2.5x2.5 (in/in) Discharge Instruction: Apply directly to wound bed as directed Secondary Dressing Optifoam Non-Adhesive Dressing, 4x4 in Discharge Instruction: Apply x1 layer donut to aid in offloading. Woven Gauze Sponges 2x2 in Discharge Instruction: Apply over primary dressing as directed. Secured With Conforming Stretch Gauze Bandage, Sterile 2x75 (in/in) Discharge Instruction: Secure with stretch gauze as directed. 42M Medipore H Soft Cloth Surgical T ape, 4 x 10 (in/yd) Discharge Instruction: Secure with tape as directed. Compression Wrap Compression Stockings Add-Ons Electronic Signature([name]) Signed: 03/12/2022 4:47:24 PM By: Rhae Hammock RN Entered By: Rhae Hammock on 03/11/2022 10:49:38 -------------------------------------------------------------------------------- Wound Assessment Details Patient Name: Date of Service: [name], [name] 03/11/2022 10:15 A M Medical Record  Number: [id number] Patient Account Number: [id number] Date of Birth/Sex: Treating RN: 1965-08-22 (57 y.o. [name], [name] Primary Care Srinika Delone: [name] Other Clinician: Referring Adine Heimann: Treating Dimitry Holsworth/Extender: [name]: 4 Wound Status Wound Number: 2 Primary Open Surgical Wound Etiology: Wound Location: Left, Plantar Foot Wound Open Wounding Event: Surgical Injury Status: Date Acquired: 01/14/2022 Comorbid Lymphedema, Sleep Apnea, Hypertension, Peripheral Venous Weeks Of Treatment: 4 History: Disease, Osteoarthritis, Neuropathy Clustered Wound: No Pending Amputation On Presentation [name], [name] ([id number]) [phone].pdf Page 9 of 10 Photos Wound Measurements Length: (cm) 0.7 Width: (cm) 0.4 Depth: (cm) 0.8 Area: (cm) 0.22 Volume: (cm) 0.176 % Reduction in Area: -86.4% % Reduction in Volume: -633.3% Epithelialization: None Tunneling: No Undermining: Yes Starting Position (o'clock): 12 Ending Position (o'clock): 12 Maximum Distance: (cm) 0.4 Wound Description Classification: Full  Thickness Without Exposed Support Structures Wound Margin: Distinct, outline attached Exudate Amount: Medium Exudate Type: Serosanguineous Exudate Color: red, brown Foul Odor After Cleansing: No Slough/Fibrino Yes Wound Bed Granulation Amount: Large (67-100%) Exposed Structure Granulation Quality: Red, Pink Fascia Exposed: No Necrotic Amount: None Present (0%) Fat Layer (Subcutaneous Tissue) Exposed: Yes Tendon Exposed: No Muscle Exposed: No Joint Exposed: No Bone Exposed: No Periwound Skin Texture Texture Color No Abnormalities Noted: No No Abnormalities Noted: No Callus: Yes Atrophie Blanche: No Crepitus: No Cyanosis: No Excoriation: No Ecchymosis: No Induration: No Erythema: No Rash: No Hemosiderin Staining: No Scarring: No Mottled: No Pallor: No Moisture Rubor: No No  Abnormalities Noted: No Dry / Scaly: No Maceration: No Treatment Notes Wound #2 (Foot) Wound Laterality: Plantar, Left Cleanser Wound Cleanser Discharge Instruction: Cleanse the wound with wound cleanser prior to applying a clean dressing using gauze sponges, not tissue or cotton balls. Byram Ancillary Kit - 15 Day Supply Discharge Instruction: Use supplies as instructed; Kit contains: (15) Saline Bullets; (15) 3x3 Gauze; 15 pr Gloves Peri-Wound Care Topical Gentamicin Discharge Instruction: As directed by physician Mupirocin Ointment Discharge Instruction: Apply Mupirocin (Bactroban) as instructed [name], [name] ([id number] Page 10 of 10 Primary Dressing Hydrofera Blue Ready Transfer Foam, 2.5x2.5 (in/in) Discharge Instruction: Apply directly to wound bed as directed Secondary Dressing Optifoam Non-Adhesive Dressing, 4x4 in Discharge Instruction: Apply x1 layer donut to aid in offloading. Woven Gauze Sponges 2x2 in Discharge Instruction: Apply over primary dressing as directed. Secured With Conforming Stretch Gauze Bandage, Sterile 2x75 (in/in) Discharge Instruction: Secure with stretch gauze as directed. 69M Medipore H Soft Cloth Surgical T ape, 4 x 10 (in/yd) Discharge Instruction: Secure with tape as directed. Compression Wrap Compression Stockings Add-Ons Electronic Signature([name]) Signed: 03/12/2022 4:47:24 PM By: Rhae Hammock RN Entered By: Rhae Hammock on 03/11/2022 10:50:39 -------------------------------------------------------------------------------- Vitals Details Patient Name: Date of Service: John Kelp. 03/11/2022 10:15 A M Medical Record Number: [id number] Patient Account Number: [id number] Date of Birth/Sex: Treating RN: 02/28/1965 (57 y.o. [name], [name] Primary Care Kyleigh Nannini: [name] Other Clinician: Referring Ezma Rehm: Treating Samiel Peel/Extender: [name] in Treatment: 4 Vital Signs Time Taken: 10:34 Temperature (F): 99.1 Height (in): 75 Pulse (bpm): 74 Weight (lbs): 220 Respiratory Rate (breaths/min): 17 Body Mass Index (BMI): 27.5 Blood Pressure (mmHg): 132/84 Reference Range: 80 - 120 mg / dl Electronic Signature([name]) Signed: 03/12/2022 4:47:24 PM By: Rhae Hammock RN Entered By: Rhae Hammock on 03/11/2022 10:35:01

## 2022-03-14 ENCOUNTER — Encounter: Payer: BC Managed Care – PPO | Admitting: Podiatry

## 2022-03-14 NOTE — Progress Notes (Signed)
[name], [name] ([id number]) [id number] Page 1 of 10 Visit Report for 03/11/2022 Chief Complaint Document Details Patient Name: Date of Service: [name], [name] 03/11/2022 10:15 A M Medical Record Number: [id number] Patient Account Number: [id number] Date of Birth/Sex: Treating RN: [name] (57 y.o. M) Primary Care Provider: [name] Other Clinician: Referring Provider: Treating Provider/Extender: [name] in Treatment: 4 Information Obtained from: Patient Chief Complaint 02/11/2022 left great toe wound and fourth sub-met head wound Electronic Signature([name]) Signed: 03/11/2022 11:58:04 AM By: Kalman Shan DO Entered By: Kalman Shan on 03/11/2022 11:12:10 -------------------------------------------------------------------------------- Debridement Details Patient Name: Date of Service: John Kelp. 03/11/2022 10:15 A M Medical Record Number: [id number] Patient Account Number: [id number] Date of Birth/Sex: Treating RN: [name] (57 y.o. [name], [name] Primary Care Provider: [name] Other Clinician: Referring Provider: Treating Provider/Extender: [name] in Treatment: 4 Debridement Performed for Assessment: Wound #1 Left T Great oe Performed By: Physician Kalman Shan, DO Debridement Type: Debridement Level of Consciousness (Pre-procedure): Awake and Alert Pre-procedure Verification/Time Out Yes - 11:00 Taken: Start Time: 11:00 Pain Control: Lidocaine T Area Debrided (L x W): otal 0.5 (cm) x 0.5 (cm) = 0.25 (cm) Tissue and other material debrided: Viable, Non-Viable, Skin: Dermis , Skin: Epidermis Level: Skin/Epidermis Debridement Description: Selective/Open Wound Instrument: Curette Bleeding: Minimum Hemostasis Achieved: Silver Nitrate End Time: 11:00 Procedural Pain: 0 Post Procedural Pain: 0 Response to Treatment: Procedure  was tolerated well Level of Consciousness (Post- Awake and Alert procedure): Post Debridement Measurements of Total Wound Length: (cm) 0.2 Width: (cm) 0.2 Depth: (cm) 0.1 Volume: (cm) 0.003 Character of Wound/Ulcer Post Debridement: Improved Post Procedure Diagnosis [name], [name] ([id number] Page 2 of 10 Same as Pre-procedure Electronic Signature([name]) Signed: 03/11/2022 11:58:04 AM By: Kalman Shan DO Signed: 03/12/2022 4:47:24 PM By: Rhae Hammock RN Entered By: Rhae Hammock on 03/11/2022 11:01:45 -------------------------------------------------------------------------------- Debridement Details Patient Name: Date of Service: John Kelp. 03/11/2022 10:15 A M Medical Record Number: [id number] Patient Account Number: [id number] Date of Birth/Sex: Treating RN: [name] (57 y.o. [name] Primary Care Provider: [name] Other Clinician: Referring Provider: Treating Provider/Extender: [name] in Treatment: 4 Debridement Performed for Assessment: Wound #2 Left,Plantar Foot Performed By: Physician Kalman Shan, DO Debridement Type: Debridement Level of Consciousness (Pre-procedure): Awake and Alert Pre-procedure Verification/Time Out Yes - 11:00 Taken: Start Time: 11:00 Pain Control: Lidocaine T Area Debrided (L x W): otal 0.7 (cm) x 0.4 (cm) = 0.28 (cm) Tissue and other material debrided: Viable, Non-Viable, Subcutaneous, Skin: Dermis , Skin: Epidermis Level: Skin/Subcutaneous Tissue Debridement Description: Excisional Instrument: Curette Bleeding: Minimum Hemostasis Achieved: Silver Nitrate End Time: 11:00 Procedural Pain: 0 Post Procedural Pain: 0 Response to Treatment: Procedure was tolerated well Level of Consciousness (Post- Awake and Alert procedure): Post Debridement Measurements of Total Wound Length: (cm) 0.7 Width: (cm) 0.4 Depth: (cm)  0.8 Volume: (cm) 0.176 Character of Wound/Ulcer Post Debridement: Improved Post Procedure Diagnosis Same as Pre-procedure Electronic Signature([name]) Signed: 03/11/2022 1:46:46 PM By: Kalman Shan DO Signed: 03/12/2022 5:48:24 PM By: Deon Pilling RN, BSN Previous Signature: 03/11/2022 11:58:04 AM Version By: Kalman Shan DO Entered By: Deon Pilling on 03/11/2022 12:05:54 -------------------------------------------------------------------------------- HPI Details Patient Name: Date of Service: John Kelp. 03/11/2022 10:15 A M Medical Record Number: [id number] Patient Account Number: [id number] Date of Birth/Sex: Treating RN: [name] (57 y.o. M) Primary Care Provider: [name] Other Clinician: DOVER, [name] ([id number]) [id number] Page 3 of 10 Referring Provider: Treating Provider/Extender: Ronnald Nian,  Ajith Weeks in Treatment: 4 History of Present Illness HPI Description: 02/11/2022 [name] is a 57 year old male with a past medical history of current tobacco user, OSA, venous insufficiency and peripheral neuropathy that presents the clinic for a 2-week history of ulcer to the left great toe and 2-68-monthhistory of nonhealing ulcer to the left plantar foot. He works in a [employer] is on his feet all day. He wears steel toed boots. He is currently out of work due to his wounds. On 11/26/2021 he was admitted to the hospital for cellulitis and abscess to the left foot. 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 He states he has been on doxycycline for 10 weeks. He just completed this course. He uses regular tennis shoes for walking. He is not offloading the area. He is using Betadine for wound dressings. He has been following with Dr. PPosey Pronto podiatry who Referred to our clinic. 1/23; patient presents for follow-up. He has been using Medihoney and Hydrofera Blue to the  wound beds Along with his surgical shoe and offloading peg assist. He has no issues or complaints today. He denies signs of infection. 1/30; patient presents for follow-up. He has been using Medihoney and Hydrofera Blue to the wound beds. He has no issues or complaints today. Plan is for the total contact cast and patient is agreeable to move forward with this. 2/1; patient presents for follow-up. We have been using antibiotic ointment with Hydrofera Blue to the wound beds under the total contact cast. He presents today for his obligatory cast change. He had no issues with the cast. 2/6; patient presents for follow-up. We have been using antibiotic ointment with Hydrofera Blue to the wound beds under the total contact cast. He has no issues or complaints today. He has tolerated the cast well. 2/13; patient presents for follow-up. We have been using the total contact cast to the left lower extremity. We have been using Hydrofera Blue and antibiotic ointment to the wound beds. He has no issues or complaints today. Electronic Signature([name]) Signed: 03/11/2022 11:58:04 AM By: HKalman ShanDO Entered By: HKalman Shanon 03/11/2022 11:12:40 -------------------------------------------------------------------------------- Physical Exam Details Patient Name: Date of Service: RFRANDY, EDMISON2/13/2024 10:15 A M Medical Record Number: [id number] Account Number: [id number] of Birth/Sex: Treating RN: 616-John-[name](57y.o. M) Primary Care Provider: RMerrilee SeashoreOther Clinician: Referring Provider: Treating Provider/Extender: HWandra Scotin Treatment: 4 Constitutional respirations regular, non-labored and within target range for patient.. Cardiovascular 2+ dorsalis pedis/posterior tibialis pulses. Psychiatric pleasant and cooperative. Notes Left foot: T the plantar aspect of the great toe there is an open wound with granulation tissue, callus  and nonviable tissue. T the plantar aspect of the foot o o there is an open wound with granulation tissue and non viable tissue. No signs of surrounding soft tissue infection to any of the wound beds. Electronic Signature([name]) Signed: 03/11/2022 11:58:04 AM By: HKalman ShanDO Entered By: HKalman Shanon 03/11/2022 11:13:44 Physician Orders Details -------------------------------------------------------------------------------- RHelene Kelp([id number] Page 4 of 10 Patient Name: Date of Service: RJARRICK, HENRICHS2/13/2024 10:15 A M Medical Record Number: [id number] Account Number: [id number] of Birth/Sex: Treating RN: [id number](57y.o. MErie NoePrimary Care Provider: RMerrilee SeashoreOther Clinician: Referring Provider: Treating Provider/Extender: HWandra Scotin Treatment: 4 Verbal / Phone Orders: No Diagnosis Coding Follow-up Appointments ppointment in 1 week. - Dr. HHeber [place]Tuesday 03/18/2022 0900 Rm #  7 (already has appt.) Return A ppointment in 2 weeks. - Tuesday 03/25/22 @ 9:30 w/ Dr. Heber [place] and Allayne Butcher Rm # 9 Return A Anesthetic (In clinic) Topical Lidocaine 4% applied to wound bed Bathing/ Shower/ Hygiene May shower with protection but do not get wound dressing([name]) wet. Protect dressing([name]) with water repellant cover (for example, large plastic bag) or a cast cover and may then take shower. Edema Control - Lymphedema / SCD / Other Elevate legs to the level of the heart or above for 30 minutes daily and/or when sitting for 3-4 times a day throughout the day. Avoid standing for long periods of time. Off-Loading Total Contact Cast to Left Lower Extremity - Size 3 Wound Treatment Wound #1 - T Great oe Wound Laterality: Left Cleanser: Wound Cleanser 1 x Per Week/30 Days Discharge Instructions: Cleanse the wound with wound cleanser prior to applying a clean dressing  using gauze sponges, not tissue or cotton balls. Cleanser: Byram Ancillary Kit - 15 Day Supply (Generic) 1 x Per Week/30 Days Discharge Instructions: Use supplies as instructed; Kit contains: (15) Saline Bullets; (15) 3x3 Gauze; 15 pr Gloves Topical: Gentamicin 1 x Per Week/30 Days Discharge Instructions: As directed by physician Topical: Mupirocin Ointment 1 x Per Week/30 Days Discharge Instructions: Apply Mupirocin (Bactroban) as instructed Prim Dressing: Hydrofera Blue Ready Transfer Foam, 2.5x2.5 (in/in) (Generic) 1 x Per Week/30 Days ary Discharge Instructions: Apply directly to wound bed as directed Secondary Dressing: Optifoam Non-Adhesive Dressing, 4x4 in (Generic) 1 x Per Week/30 Days Discharge Instructions: Apply x1 layer donut to aid in offloading. Secondary Dressing: Woven Gauze Sponges 2x2 in (Generic) 1 x Per Week/30 Days Discharge Instructions: Apply over primary dressing as directed. Secured With: Child psychotherapist, Sterile 2x75 (in/in) (Generic) 1 x Per Week/30 Days Discharge Instructions: Secure with stretch gauze as directed. Secured With: 55M Medipore H Soft Cloth Surgical T ape, 4 x 10 (in/yd) (Generic) 1 x Per Week/30 Days Discharge Instructions: Secure with tape as directed. Wound #2 - Foot Wound Laterality: Plantar, Left Cleanser: Wound Cleanser 1 x Per Week/30 Days Discharge Instructions: Cleanse the wound with wound cleanser prior to applying a clean dressing using gauze sponges, not tissue or cotton balls. Cleanser: Byram Ancillary Kit - 15 Day Supply (Generic) 1 x Per Week/30 Days Discharge Instructions: Use supplies as instructed; Kit contains: (15) Saline Bullets; (15) 3x3 Gauze; 15 pr Gloves Topical: Gentamicin 1 x Per Week/30 Days Discharge Instructions: As directed by physician Topical: Mupirocin Ointment 1 x Per Week/30 Days Discharge Instructions: Apply Mupirocin (Bactroban) as instructed Prim Dressing: Hydrofera Blue Ready Transfer Foam,  2.5x2.5 (in/in) (Generic) 1 x Per Week/30 Days ary Discharge Instructions: Apply directly to wound bed as directed Secondary Dressing: Optifoam Non-Adhesive Dressing, 4x4 in (Generic) 1 x Per Week/30 Days Discharge Instructions: Apply x1 layer donut to aid in offloading. Secondary Dressing: Woven Gauze Sponges 2x2 in (Generic) 1 x Per Week/30 Days [name], [name] ([id number]) [id number] Page 5 of 10 Discharge Instructions: Apply over primary dressing as directed. Secured With: Child psychotherapist, Sterile 2x75 (in/in) (Generic) 1 x Per Week/30 Days Discharge Instructions: Secure with stretch gauze as directed. Secured With: 55M Medipore H Soft Cloth Surgical T ape, 4 x 10 (in/yd) (Generic) 1 x Per Week/30 Days Discharge Instructions: Secure with tape as directed. Electronic Signature([name]) Signed: 03/11/2022 11:58:04 AM By: Kalman Shan DO Entered By: Kalman Shan on 03/11/2022 11:13:51 -------------------------------------------------------------------------------- Problem List Details Patient Name: Date of Service: John Kelp. 03/11/2022 10:15 A M Medical  Record Number: [id number] Patient Account Number: [id number] Date of Birth/Sex: Treating RN: 05-Jan-[name] (57 y.o. M) Primary Care Provider: [name] Other Clinician: Referring Provider: Treating Provider/Extender: [name] in Treatment: 4 Active Problems ICD-10 Encounter Code Description Active Date MDM Diagnosis [phone] Non-pressure chronic ulcer of other part of 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 02/11/2022 No Yes G90.09 Other idiopathic peripheral autonomic neuropathy 02/11/2022 No Yes G47.33 Obstructive sleep apnea (adult) (pediatric) 02/11/2022 No Yes I87.2 Venous insufficiency (chronic) (peripheral) 02/11/2022 No Yes Inactive Problems Resolved Problems Electronic Signature([name]) Signed: 03/11/2022 11:58:04 AM By: Kalman Shan  DO Entered By: Kalman Shan on 03/11/2022 11:11:34 -------------------------------------------------------------------------------- Progress Note Details Patient Name: Date of Service: John Kelp 03/11/2022 10:15 A M Medical Record Number: [id number] Patient Account Number: [id number] Date of Birth/Sex: Treating RN: [birth year] [name] (57 y.o. [name], [name] ([id number]) [id number] Page 6 of 10 Primary Care Provider: [name] Other Clinician: Referring Provider: Treating Provider/Extender: [name] in Treatment: 4 Subjective Chief Complaint Information obtained from Patient 02/11/2022 left great toe wound and fourth sub-met head wound History of Present Illness (HPI) 02/11/2022 Mr. Corrion Smisek is a 57 year old male with a past medical history of current tobacco user, OSA, venous insufficiency and peripheral neuropathy that presents the clinic for a 2-week history of ulcer to the left great toe and 2-77-monthhistory of nonhealing ulcer to the left plantar foot. He works in a [employer] is on his feet all day. He wears steel toed boots. He is currently out of work due to his wounds. On 11/26/2021 he was admitted to the hospital for cellulitis and abscess to the left foot. 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 He states he has been on doxycycline for 10 weeks. He just completed this course. He uses regular tennis shoes for walking. He is not offloading the area. He is using Betadine for wound dressings. He has been following with Dr. PPosey Pronto podiatry who Referred to our clinic. 1/23; patient presents for follow-up. He has been using Medihoney and Hydrofera Blue to the wound beds Along with his surgical shoe and offloading peg assist. He has no issues or complaints today. He denies signs of infection. 1/30; patient presents for follow-up. He has been using Medihoney and  Hydrofera Blue to the wound beds. He has no issues or complaints today. Plan is for the total contact cast and patient is agreeable to move forward with this. 2/1; patient presents for follow-up. We have been using antibiotic ointment with Hydrofera Blue to the wound beds under the total contact cast. He presents today for his obligatory cast change. He had no issues with the cast. 2/6; patient presents for follow-up. We have been using antibiotic ointment with Hydrofera Blue to the wound beds under the total contact cast. He has no issues or complaints today. He has tolerated the cast well. 2/13; patient presents for follow-up. We have been using the total contact cast to the left lower extremity. We have been using Hydrofera Blue and antibiotic ointment to the wound beds. He has no issues or complaints today. Patient History Family History Heart Disease - Mother, Lung Disease - Mother. Social History Current every day smoker - 10 cig a day, Alcohol Use - Never, Drug Use - No History, Caffeine Use - Never. Medical History Hematologic/Lymphatic Patient has history of Lymphedema - pumps use daily Respiratory Patient has history of Sleep Apnea - CPAP Cardiovascular Patient has history of Hypertension, Peripheral Venous  Disease - needs ablation on left leg Musculoskeletal Patient has history of Osteoarthritis Denies history of Osteomyelitis Neurologic Patient has history of Neuropathy Hospitalization/Surgery History - 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. Medical A Surgical History Notes nd Musculoskeletal abscess to left foot 3rd toe 11/28/2022 Objective Constitutional respirations regular, non-labored and within target range for patient.. Vitals Time Taken: 10:34 AM, Height: 75 in, Weight: 220 lbs, BMI: 27.5, Temperature: 99.1 F, Pulse: 74 bpm, Respiratory Rate: 17 breaths/min, Blood Pressure: 132/84 mmHg. Cardiovascular 2+ dorsalis pedis/posterior tibialis  pulses. Psychiatric pleasant and cooperative. General Notes: Left foot: T the plantar aspect of the great toe there is an open wound with granulation tissue, callus and nonviable tissue. T the plantar o o aspect of the foot there is an open wound with granulation tissue and non viable tissue. No signs of surrounding soft tissue infection to any of the wound beds. [name], [name] ([id number]) [id number] Page 7 of 10 Integumentary (Hair, Skin) Wound #1 status is Open. Original cause of wound was Shear/Friction. The date acquired was: 01/28/2022. The wound has been in treatment 4 weeks. The wound is located on the Left T Great. The wound measures 0.2cm length x 0.2cm width x 0.1cm depth; 0.031cm^2 area and 0.003cm^3 volume. There is Fat Layer oe (Subcutaneous Tissue) exposed. There is no tunneling noted, however, there is undermining starting at 12:00 and ending at 12:00 with a maximum distance of 0.4cm. There is a medium amount of serosanguineous drainage noted. The wound margin is distinct with the outline attached to the wound base. There is large (67-100%) pink granulation within the wound bed. There is no necrotic tissue within the wound bed. The periwound skin appearance exhibited: Callus. The periwound skin appearance did not exhibit: Crepitus, Excoriation, Induration, Rash, Scarring, Dry/Scaly, Maceration, Atrophie Blanche, Cyanosis, Ecchymosis, Hemosiderin Staining, Mottled, Pallor, Rubor, Erythema. Periwound temperature was noted as No Abnormality. Wound #2 status is Open. Original cause of wound was Surgical Injury. The date acquired was: 01/14/2022. The wound has been in treatment 4 weeks. The wound is located on the [employer]. The wound measures 0.7cm length x 0.4cm width x 0.8cm depth; 0.22cm^2 area and 0.176cm^3 volume. There is Fat Layer (Subcutaneous Tissue) exposed. There is no tunneling noted, however, there is undermining starting at 12:00 and  ending at 12:00 with a maximum distance of 0.4cm. There is a medium amount of serosanguineous drainage noted. The wound margin is distinct with the outline attached to the wound base. There is large (67-100%) red, pink granulation within the wound bed. There is no necrotic tissue within the wound bed. The periwound skin appearance exhibited: Callus. The periwound skin appearance did not exhibit: Crepitus, Excoriation, Induration, Rash, Scarring, Dry/Scaly, Maceration, Atrophie Blanche, Cyanosis, Ecchymosis, Hemosiderin Staining, Mottled, Pallor, Rubor, Erythema. 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 It has improved in size appearance since last clinic visit. The plantar wound is stable. I debrided nonviable tissue to the wound beds. I recommended continuing Hydrofera Blue and antibiotic ointment under the total contact cast. Follow-up in 1 week. 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 There was a Selective/Open Wound Skin/Epidermis oe Debridement with a total area of 0.25 sq cm performed by Kalman Shan, DO. With the following instrument([name]): Curette to remove Viable  and Non-Viable tissue/material. Material removed includes Skin: Dermis and Skin: Epidermis and after achieving pain control using Lidocaine. No specimens were taken. A time out was conducted at 11:00, prior to the start of the procedure. A Minimum amount of bleeding was controlled with Silver Nitrate. The procedure was tolerated well with a pain level of 0 throughout and a pain level of 0 following the procedure. Post Debridement Measurements: 0.2cm length x 0.2cm width x 0.1cm depth; 0.003cm^3  volume. Character of Wound/Ulcer Post Debridement is improved. Post procedure Diagnosis Wound #1: Same as Pre-Procedure Pre-procedure diagnosis of Wound #1 is a Neuropathic Ulcer-Non Diabetic located on the Left T Great . There was a T Programmer, multimedia Procedure by Bjorn Pippin, Millie Forde, DO. Post procedure Diagnosis Wound #1: Same as Pre-Procedure Wound #2 Pre-procedure diagnosis of Wound #2 is an Open Surgical Wound located on the [place] . There was a Excisional Skin/Subcutaneous Tissue Debridement with a total area of 0.28 sq cm performed by Kalman Shan, DO. With the following instrument([name]): Curette to remove Viable and Non-Viable tissue/material. Material removed includes Subcutaneous Tissue, Skin: Dermis, and Skin: Epidermis after achieving pain control using Lidocaine. No specimens were taken. A time out was conducted at 11:00, prior to the start of the procedure. A Minimum amount of bleeding was controlled with Silver Nitrate. The procedure was tolerated well with a pain level of 0 throughout and a pain level of 0 following the procedure. Post Debridement Measurements: 0.7cm length x 0.4cm width x 0.8cm depth; 0.176cm^3 volume. Character of Wound/Ulcer Post Debridement is improved. Post procedure Diagnosis Wound #2: Same as Pre-Procedure Pre-procedure diagnosis of Wound #2 is an Open Surgical Wound located on the Left,Plantar Foot . There was a T Programmer, multimedia Procedure by Enzo Montgomery, DO. Post procedure Diagnosis Wound #2: Same as Pre-Procedure Plan Follow-up Appointments: Return Appointment in 1 week. - Dr. Heber Dyer Tuesday 03/18/2022 0900 Rm # 7 (already has appt.) Return Appointment in 2 weeks. - Tuesday 03/25/22 @ 9:30 w/ Dr. Heber [place] and Allayne Butcher Rm # 9 Anesthetic: (In clinic) Topical Lidocaine 4% applied to wound bed Bathing/ Shower/ Hygiene: May shower with protection but do not get wound dressing([name]) wet. Protect dressing([name]) with water repellant cover (for  example, large plastic bag) or a cast cover and may then take shower. Edema Control - Lymphedema / SCD / OtherMARAT, [name] ([id number]) [id number] Page 8 of 10 Elevate legs to the level of the heart or above for 30 minutes daily and/or when sitting for 3-4 times a day throughout the day. Avoid standing for long periods of time. Off-Loading: T Contact Cast to Left Lower Extremity - Size 3 otal WOUND #1: - T Great Wound Laterality: Left oe Cleanser: Wound Cleanser 1 x Per Week/30 Days Discharge Instructions: Cleanse the wound with wound cleanser prior to applying a clean dressing using gauze sponges, not tissue or cotton balls. Cleanser: Byram Ancillary Kit - 15 Day Supply (Generic) 1 x Per Week/30 Days Discharge Instructions: Use supplies as instructed; Kit contains: (15) Saline Bullets; (15) 3x3 Gauze; 15 pr Gloves Topical: Gentamicin 1 x Per Week/30 Days Discharge Instructions: As directed by physician Topical: Mupirocin Ointment 1 x Per Week/30 Days Discharge Instructions: Apply Mupirocin (Bactroban) as instructed Prim Dressing: Hydrofera Blue Ready Transfer Foam, 2.5x2.5 (in/in) (Generic) 1 x Per Week/30 Days ary Discharge Instructions: Apply directly to wound bed as directed Secondary Dressing: Optifoam Non-Adhesive Dressing, 4x4 in (Generic) 1 x Per Week/30 Days Discharge Instructions: Apply x1 layer donut to aid  in offloading. Secondary Dressing: Woven Gauze Sponges 2x2 in (Generic) 1 x Per Week/30 Days Discharge Instructions: Apply over primary dressing as directed. Secured With: Child psychotherapist, Sterile 2x75 (in/in) (Generic) 1 x Per Week/30 Days Discharge Instructions: Secure with stretch gauze as directed. Secured With: 67M Medipore H Soft Cloth Surgical T ape, 4 x 10 (in/yd) (Generic) 1 x Per Week/30 Days Discharge Instructions: Secure with tape as directed. WOUND #2: - Foot Wound Laterality: Plantar, Left Cleanser: Wound  Cleanser 1 x Per Week/30 Days Discharge Instructions: Cleanse the wound with wound cleanser prior to applying a clean dressing using gauze sponges, not tissue or cotton balls. Cleanser: [place] Ancillary Kit - 15 Day Supply (Generic) 1 x Per Week/30 Days Discharge Instructions: Use supplies as instructed; Kit contains: (15) Saline Bullets; (15) 3x3 Gauze; 15 pr Gloves Topical: Gentamicin 1 x Per Week/30 Days Discharge Instructions: As directed by physician Topical: Mupirocin Ointment 1 x Per Week/30 Days Discharge Instructions: Apply Mupirocin (Bactroban) as instructed Prim Dressing: Hydrofera Blue Ready Transfer Foam, 2.5x2.5 (in/in) (Generic) 1 x Per Week/30 Days ary Discharge Instructions: Apply directly to wound bed as directed Secondary Dressing: Optifoam Non-Adhesive Dressing, 4x4 in (Generic) 1 x Per Week/30 Days Discharge Instructions: Apply x1 layer donut to aid in offloading. Secondary Dressing: Woven Gauze Sponges 2x2 in (Generic) 1 x Per Week/30 Days Discharge Instructions: Apply over primary dressing as directed. Secured With: Child psychotherapist, Sterile 2x75 (in/in) (Generic) 1 x Per Week/30 Days Discharge Instructions: Secure with stretch gauze as directed. Secured With: 67M Medipore H Soft Cloth Surgical T ape, 4 x 10 (in/yd) (Generic) 1 x Per Week/30 Days Discharge Instructions: Secure with tape as directed. 1. In office sharp debridement 2. T contact cast placed in standard fashion otal 3. Follow-up in 1 week Electronic Signature([name]) Signed: 03/11/2022 1:46:46 PM By: Kalman Shan DO Signed: 03/12/2022 5:48:24 PM By: Deon Pilling RN, BSN Previous Signature: 03/11/2022 11:58:04 AM Version By: Kalman Shan DO Entered By: Deon Pilling on 03/11/2022 12:06:12 -------------------------------------------------------------------------------- HxROS Details Patient Name: Date of Service: John Kelp. 03/11/2022 10:15 A M Medical Record Number:  [id number] Patient Account Number: [id number] Date of Birth/Sex: Treating RN: [birth year] [name] (57 y.o. M) Primary Care Provider: [name] Other Clinician: Referring Provider: Treating Provider/Extender: [name] in Treatment: 4 Hematologic/Lymphatic Medical History: Positive for: Lymphedema - pumps use daily Respiratory Medical History: Positive for: Sleep Apnea - CPAP Favorite, Karder [name] ([id number]) [phone].pdf Page 9 of 10 Cardiovascular Medical History: Positive for: Hypertension; Peripheral Venous Disease - needs ablation on left leg Musculoskeletal Medical History: Positive for: Osteoarthritis Negative for: Osteomyelitis Past Medical History Notes: abscess to left foot 3rd toe 11/28/2022 Neurologic Medical History: Positive for: Neuropathy Immunizations Implantable Devices No devices added Hospitalization / Surgery History Type of Hospitalization/Surgery 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 Family and Social History Heart Disease: Yes - Mother; Lung Disease: Yes - Mother; Current every day smoker - 10 cig a day; Alcohol Use: Never; Drug Use: No History; Caffeine Use: Never; Financial Concerns: No; Food, Clothing or Shelter Needs: No; Support System Lacking: No; Transportation Concerns: No Electronic Signature([name]) Signed: 03/11/2022 11:58:04 AM By: Kalman Shan DO Entered By: Kalman Shan on 03/11/2022 11:13:20 -------------------------------------------------------------------------------- Total Contact Cast Details Patient Name: Date of Service: [name], [name] 03/11/2022 10:15 A M Medical Record Number: [id number] Patient Account Number: [id number] Date of Birth/Sex: Treating RN: John [birth year], [name] (57 y.o. [place] Noe Primary Care Provider: [name] Other Clinician: Referring Provider: Treating Provider/Extender: Heber Faxon  Marcelo Baldy, Ajith Weeks in Treatment:  4 T Contact Cast Applied for Wound Assessment: otal Wound #1 Left T Great oe Performed By: Physician Kalman Shan, DO Post Procedure Diagnosis Same as Pre-procedure Electronic Signature([name]) Signed: 03/11/2022 11:58:04 AM By: Kalman Shan DO Signed: 03/12/2022 4:47:24 PM By: Rhae Hammock RN Entered By: Rhae Hammock on 03/11/2022 11:32:35 -------------------------------------------------------------------------------- Total Contact Cast Details Patient Name: Date of Service: [name], HARKLEROAD 03/11/2022 10:15 A Rulon Eisenmenger, [name] ([id number].pdf Page 10 of 10 Medical Record Number: [id number] Patient Account Number: [id number] Date of Birth/Sex: Treating RN: [name] (56 y.o. [name], [name] Primary Care Provider: [name] Other Clinician: Referring Provider: Treating Provider/Extender: [name] in Treatment: 4 T Contact Cast Applied for Wound Assessment: otal Wound #2 Left,Plantar Foot Performed By: Physician Kalman Shan, DO Post Procedure Diagnosis Same as Pre-procedure Electronic Signature([name]) Signed: 03/11/2022 11:58:04 AM By: Kalman Shan DO Signed: 03/12/2022 4:47:24 PM By: Rhae Hammock RN Entered By: Rhae Hammock on 03/11/2022 11:32:35 -------------------------------------------------------------------------------- SuperBill Details Patient Name: Date of Service: John Kelp 03/11/2022 Medical Record Number: [id number] Patient Account Number: [id number] Date of Birth/Sex: Treating RN: [birth year] [name] (57 y.o. [name], [name] Primary Care Provider: [name] Other Clinician: Referring Provider: Treating Provider/Extender: [name] in Treatment: 4 Diagnosis Coding ICD-10 Codes Code Description [phone] Non-pressure chronic ulcer of other part of 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 G90.09 Other idiopathic  peripheral autonomic neuropathy G47.33 Obstructive sleep apnea (adult) (pediatric) I87.2 Venous insufficiency (chronic) (peripheral) Facility Procedures : CPT4 Code: [id number] Description: [id number] - DEBRIDE WOUND 1ST 20 SQ CM OR < ICD-10 Diagnosis Description L97.522 Non-pressure chronic ulcer of other part of 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 Modifier: Quantity: 1 : CPT4 Code: [id number] Description: [place] - DEB SUBQ TISSUE 20 SQ CM/< ICD-10 Diagnosis Description L97.522 Non-pressure chronic ulcer of other part of 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 Modifier: Quantity: 1 Physician Procedures : CPT4 Code Description Modifier [id number] - WC PHYS DEBR WO ANESTH 20 SQ CM ICD-10 Diagnosis Description L97.522 Non-pressure chronic ulcer of other part of 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 Quantity: 1 : [id number] - WC PHYS SUBQ TISS 20 SQ CM ICD-10 Diagnosis Description L97.522 Non-pressure chronic ulcer of other part of 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 Quantity: 1 Electronic Signature([name]) Signed: 03/11/2022 11:58:04 AM By: Kalman Shan DO Entered By: Kalman Shan on 03/11/2022 11:15:12

## 2022-03-18 ENCOUNTER — Encounter (HOSPITAL_BASED_OUTPATIENT_CLINIC_OR_DEPARTMENT_OTHER): Payer: BC Managed Care – PPO | Admitting: Internal Medicine

## 2022-03-18 DIAGNOSIS — L97522 Non-pressure chronic ulcer of other part of 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: Secondary | ICD-10-CM

## 2022-03-18 DIAGNOSIS — M1990 Unspecified osteoarthritis, unspecified site: Secondary | ICD-10-CM | POA: Diagnosis not present

## 2022-03-18 DIAGNOSIS — G629 Polyneuropathy, unspecified: Secondary | ICD-10-CM | POA: Diagnosis not present

## 2022-03-18 DIAGNOSIS — L02612 Cutaneous abscess of left foot: Secondary | ICD-10-CM | POA: Diagnosis not present

## 2022-03-18 DIAGNOSIS — G9009 Other idiopathic peripheral autonomic neuropathy: Secondary | ICD-10-CM

## 2022-03-18 DIAGNOSIS — I872 Venous insufficiency (chronic) (peripheral): Secondary | ICD-10-CM | POA: Diagnosis not present

## 2022-03-21 NOTE — Progress Notes (Signed)
[name], [name] ([id number]) [id number] Page 1 of 9 Visit Report for 03/18/2022 Chief Complaint Document Details Patient Name: Date of Service: [name], [name] 03/18/2022 9:00 A M Medical Record Number: [id number] Patient Account Number: [id number] Date of Birth/Sex: Treating RN: 04-03-1965 (57 y.o. M) Primary Care Provider: [name] Other Clinician: Referring Provider: Treating Provider/Extender: [name] in Treatment: 5 Information Obtained from: Patient Chief Complaint 02/11/2022 left great toe wound and fourth sub-met head wound Electronic Signature([name]) Signed: 03/18/2022 10:46:21 AM By: Kalman Shan DO Entered By: Kalman Shan on 03/18/2022 10:40:22 -------------------------------------------------------------------------------- HPI Details Patient Name: Date of Service: John Kelp. 03/18/2022 9:00 A M Medical Record Number: [id number] Patient Account Number: [id number] Date of Birth/Sex: Treating RN: [birth year] (57 y.o. M) Primary Care Provider: [name] Other Clinician: Referring Provider: Treating Provider/Extender: [name] in Treatment: 5 History of Present Illness HPI Description: 02/11/2022 [name] is a 57 year old male with a past medical history of current tobacco user, OSA, venous insufficiency and peripheral neuropathy that presents the clinic for a 2-week history of ulcer to the left great toe and 2-68-monthhistory of nonhealing ulcer to the left plantar foot. [place] day. He wears steel toed boots. He is currently out of work due to his wounds. On 11/26/2021 he was admitted to the hospital for cellulitis and abscess to the left foot. 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 He states he has been on doxycycline for 10 weeks. He just completed this course.  He uses regular tennis shoes for walking. He is not offloading the area. He is using Betadine for wound dressings. He has been following with Dr. PPosey Pronto podiatry who Referred to our clinic. 1/23; patient presents for follow-up. He has been using Medihoney and Hydrofera Blue to the wound beds Along with his surgical shoe and offloading peg assist. He has no issues or complaints today. He denies signs of infection. 1/30; patient presents for follow-up. He has been using Medihoney and Hydrofera Blue to the wound beds. He has no issues or complaints today. Plan is for the total contact cast and patient is agreeable to move forward with this. 2/1; patient presents for follow-up. We have been using antibiotic ointment with Hydrofera Blue to the wound beds under the total contact cast. He presents today for his obligatory cast change. He had no issues with the cast. 2/6; patient presents for follow-up. We have been using antibiotic ointment with Hydrofera Blue to the wound beds under the total contact cast. He has no issues or complaints today. He has tolerated the cast well. 2/13; patient presents for follow-up. We have been using the total contact cast to the left lower extremity. We have been using Hydrofera Blue and antibiotic ointment to the wound beds. He has no issues or complaints today. 2/20; patient presents for follow-up. We were using the total contact cast along with Hydrofera Blue and antibiotic ointment. He states the cast was slightly uncomfortable over the past week. Electronic Signature([name]) Signed: 03/18/2022 10:46:21 AM By: HMarga Melnick WAntionette Char([id number] Page 2 of 9 Entered By: HKalman Shanon 03/18/2022 10:42:18 -------------------------------------------------------------------------------- Physical Exam Details Patient Name: Date of Service: [name], [name]2/20/2024 9:00 A M Medical Record Number:  [id number] Account Number: [id number] of Birth/Sex: Treating RN: 627-Sep-1967(57y.o. M) Primary Care Provider: RMerrilee SeashoreOther Clinician: Referring Provider: Treating Provider/Extender: HKalman Shan  [name] Weeks in Treatment: 5 Constitutional respirations regular, non-labored and within target range for patient.. Cardiovascular 2+ dorsalis pedis/posterior tibialis pulses. Psychiatric pleasant and cooperative. Notes Left foot: T the plantar aspect of the great toe there is an open wound with granulation tissue and surrounding callus. T the plantar aspect of the foot there is o o an open wound with granulation tissue, pocket of increased depth in the center. No probing to bone. No signs of surrounding soft tissue infection to any of the wound beds. Electronic Signature([name]) Signed: 03/18/2022 10:46:21 AM By: Kalman Shan DO Entered By: Kalman Shan on 03/18/2022 10:43:19 -------------------------------------------------------------------------------- Physician Orders Details Patient Name: Date of Service: John Kelp. 03/18/2022 9:00 A M Medical Record Number: [id number] Patient Account Number: [id number] Date of Birth/Sex: Treating RN: 10-22-1965 (57 y.o. Mare Ferrari Primary Care Provider: [name] Other Clinician: Referring Provider: Treating Provider/Extender: [name] in Treatment: 5 Verbal / Phone Orders: No Diagnosis Coding Follow-up Appointments ppointment in 1 week. - Dr. Heber [place] Tuesday 03/25/22 @ 9:30 w/ Dr. Heber [place] and Allayne Butcher Rm # 9 Return A ppointment in 2 weeks. - Dr Heber [place] - Tuesday Return A Anesthetic (In clinic) Topical Lidocaine 4% applied to wound bed Bathing/ Shower/ Hygiene May shower with protection but do not get wound dressing([name]) wet. Protect dressing([name]) with water repellant cover (for example, large plastic bag) or a cast cover and may then take  shower. Edema Control - Lymphedema / SCD / Other Elevate legs to the level of the heart or above for 30 minutes daily and/or when sitting for 3-4 times a day throughout the day. Avoid standing for long periods of time. Off-Loading Total Contact Cast to Left Lower Extremity - Size 3 Wound Treatment TERRELL, ODOMS [name] ([id number]) [id number] Page 3 of 9 Wound #1 - T Great oe Wound Laterality: Left Cleanser: Wound Cleanser 1 x Per Week/30 Days Discharge Instructions: Cleanse the wound with wound cleanser prior to applying a clean dressing using gauze sponges, not tissue or cotton balls. Cleanser: Byram Ancillary Kit - 15 Day Supply (Generic) 1 x Per Week/30 Days Discharge Instructions: Use supplies as instructed; Kit contains: (15) Saline Bullets; (15) 3x3 Gauze; 15 pr Gloves Topical: Gentamicin 1 x Per Week/30 Days Discharge Instructions: As directed by physician Topical: Mupirocin Ointment 1 x Per Week/30 Days Discharge Instructions: Apply Mupirocin (Bactroban) as instructed Prim Dressing: Hydrofera Blue Ready Transfer Foam, 2.5x2.5 (in/in) (Generic) 1 x Per Week/30 Days ary Discharge Instructions: Apply directly to wound bed as directed Secondary Dressing: Optifoam Non-Adhesive Dressing, 4x4 in (Generic) 1 x Per Week/30 Days Discharge Instructions: Apply x1 layer donut to aid in offloading. Secondary Dressing: Woven Gauze Sponges 2x2 in (Generic) 1 x Per Week/30 Days Discharge Instructions: Apply over primary dressing as directed. Secured With: Child psychotherapist, Sterile 2x75 (in/in) (Generic) 1 x Per Week/30 Days Discharge Instructions: Secure with stretch gauze as directed. Secured With: 2M Medipore H Soft Cloth Surgical T ape, 4 x 10 (in/yd) (Generic) 1 x Per Week/30 Days Discharge Instructions: Secure with tape as directed. Wound #2 - Foot Wound Laterality: Plantar, Left Cleanser: Wound Cleanser 1 x Per Week/30 Days Discharge Instructions:  Cleanse the wound with wound cleanser prior to applying a clean dressing using gauze sponges, not tissue or cotton balls. Cleanser: Byram Ancillary Kit - 15 Day Supply (Generic) 1 x Per Week/30 Days Discharge Instructions: Use supplies as instructed; Kit contains: (15) Saline Bullets; (15) 3x3 Gauze; 15 pr Gloves Topical: Gentamicin 1 x Per  Week/30 Days Discharge Instructions: As directed by physician Topical: Mupirocin Ointment 1 x Per Week/30 Days Discharge Instructions: Apply Mupirocin (Bactroban) as instructed Prim Dressing: Hydrofera Blue Ready Transfer Foam, 2.5x2.5 (in/in) (Generic) 1 x Per Week/30 Days ary Discharge Instructions: Apply directly to wound bed as directed Secondary Dressing: Optifoam Non-Adhesive Dressing, 4x4 in (Generic) 1 x Per Week/30 Days Discharge Instructions: Apply x1 layer donut to aid in offloading. Secondary Dressing: Woven Gauze Sponges 2x2 in (Generic) 1 x Per Week/30 Days Discharge Instructions: Apply over primary dressing as directed. Secured With: Child psychotherapist, Sterile 2x75 (in/in) (Generic) 1 x Per Week/30 Days Discharge Instructions: Secure with stretch gauze as directed. Secured With: 52M Medipore H Soft Cloth Surgical T ape, 4 x 10 (in/yd) (Generic) 1 x Per Week/30 Days Discharge Instructions: Secure with tape as directed. Patient Medications llergies: No Known Drug Allergies A Notifications Medication Indication Start End prior to debridement 03/18/2022 lidocaine DOSE topical 4 % cream - cream topical once daily Electronic Signature([name]) Signed: 03/18/2022 10:46:21 AM By: Kalman Shan DO Entered By: Kalman Shan on 03/18/2022 10:43:27 Felty, Early [name] ([id number]) [id number] Page 4 of 9 -------------------------------------------------------------------------------- Problem List Details Patient Name: Date of Service: [name], [name] 03/18/2022 9:00 A M Medical Record Number:  [id number] Patient Account Number: [id number] Date of Birth/Sex: Treating RN: 07/31/1965 (57 y.o. M) Primary Care Provider: [name] Other Clinician: Referring Provider: Treating Provider/Extender: [name] in Treatment: 5 Active Problems ICD-10 Encounter Code Description Active Date MDM Diagnosis [phone] Non-pressure chronic ulcer of other part of 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 02/11/2022 No Yes G90.09 Other idiopathic peripheral autonomic neuropathy 02/11/2022 No Yes G47.33 Obstructive sleep apnea (adult) (pediatric) 02/11/2022 No Yes I87.2 Venous insufficiency (chronic) (peripheral) 02/11/2022 No Yes Inactive Problems Resolved Problems Electronic Signature([name]) Signed: 03/18/2022 10:46:21 AM By: Kalman Shan DO Entered By: Kalman Shan on 03/18/2022 10:39:46 -------------------------------------------------------------------------------- Progress Note Details Patient Name: Date of Service: John Kelp 03/18/2022 9:00 A M Medical Record Number: [id number] Patient Account Number: [id number] Date of Birth/Sex: Treating RN: September 29, 1965 (57 y.o. M) Primary Care Provider: [name] Other Clinician: Referring Provider: Treating Provider/Extender: [name] in Treatment: 5 Subjective Chief Complaint Information obtained from Patient 02/11/2022 left great toe wound and fourth sub-met head wound History of Present Illness (HPI) 02/11/2022 [name] is a 57 year old male with a past medical history of current tobacco user, OSA, venous insufficiency and peripheral neuropathy that presents the clinic for a 2-week history of ulcer to the left great toe and 2-48-monthhistory of nonhealing ulcer to the left plantar foot. He works in a [place] is on his feet all day. He wears steel toed boots. He is currently out of work due to his wounds. On 11/26/2021 he was admitted to the  hospital for cellulitis and abscess to the left foot. 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 He states he has been on doxycycline for [place]([id number] Page 5 of 9 weeks. He just completed this course. He uses regular tennis shoes for walking. He is not offloading the area. He is using Betadine for wound dressings. He has been following with Dr. PPosey Pronto podiatry who Referred to our clinic. 1/23; patient presents for follow-up. He has been using Medihoney and Hydrofera Blue to the wound beds Along with his surgical shoe and offloading peg assist. He has no issues or complaints today. He denies signs of infection. 1/30; patient presents for  follow-up. He has been using Medihoney and Hydrofera Blue to the wound beds. He has no issues or complaints today. Plan is for the total contact cast and patient is agreeable to move forward with this. 2/1; patient presents for follow-up. We have been using antibiotic ointment with Hydrofera Blue to the wound beds under the total contact cast. He presents today for his obligatory cast change. He had no issues with the cast. 2/6; patient presents for follow-up. We have been using antibiotic ointment with Hydrofera Blue to the wound beds under the total contact cast. He has no issues or complaints today. He has tolerated the cast well. 2/13; patient presents for follow-up. We have been using the total contact cast to the left lower extremity. We have been using Hydrofera Blue and antibiotic ointment to the wound beds. He has no issues or complaints today. 2/20; patient presents for follow-up. We were using the total contact cast along with Hydrofera Blue and antibiotic ointment. He states the cast was slightly uncomfortable over the past week. Patient History Family History Heart Disease - Mother, Lung Disease - Mother. Social History Current every day smoker - 10 cig a day,  Alcohol Use - Never, Drug Use - No History, Caffeine Use - Never. Medical History Hematologic/Lymphatic Patient has history of Lymphedema - pumps use daily Respiratory Patient has history of Sleep Apnea - CPAP Cardiovascular Patient has history of Hypertension, Peripheral Venous Disease - needs ablation on left leg Musculoskeletal Patient has history of Osteoarthritis Denies history of Osteomyelitis Neurologic Patient has history of Neuropathy Hospitalization/Surgery History - 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. Medical A Surgical History Notes nd Musculoskeletal abscess to left foot 3rd toe 11/28/2022 Objective Constitutional respirations regular, non-labored and within target range for patient.. Vitals Time Taken: 9:24 AM, Height: 75 in, Weight: 220 lbs, BMI: 27.5, Temperature: 98.5 F, Pulse: 71 bpm, Respiratory Rate: 18 breaths/min, Blood Pressure: 134/86 mmHg. Cardiovascular 2+ dorsalis pedis/posterior tibialis pulses. Psychiatric pleasant and cooperative. General Notes: Left foot: T the plantar aspect of the great toe there is an open wound with granulation tissue and surrounding callus. T the plantar aspect of o o the foot there is an open wound with granulation tissue, pocket of increased depth in the center. No probing to bone. No signs of surrounding soft tissue infection to any of the wound beds. Integumentary (Hair, Skin) Wound #1 status is Open. Original cause of wound was Shear/Friction. The date acquired was: 01/28/2022. The wound has been in treatment 5 weeks. The wound is located on the Left T Great. The wound measures 0.3cm length x 0.3cm width x 0.1cm depth; 0.071cm^2 area and 0.007cm^3 volume. There is Fat Layer oe (Subcutaneous Tissue) exposed. There is no tunneling or undermining noted. There is a medium amount of serosanguineous drainage noted. The wound margin is distinct with the outline attached to the wound base. There is large (67-100%) pink  granulation within the wound bed. There is no necrotic tissue within the wound bed. The periwound skin appearance exhibited: Callus. The periwound skin appearance did not exhibit: Crepitus, Excoriation, Induration, Rash, Scarring, Dry/Scaly, Maceration, Atrophie Blanche, Cyanosis, Ecchymosis, Hemosiderin Staining, Mottled, Pallor, Rubor, Erythema. Periwound temperature was noted as No Abnormality. Wound #2 status is Open. Original cause of wound was Surgical Injury. The date acquired was: 01/14/2022. The wound has been in treatment 5 weeks. The wound is located on the [place]. The wound measures 0.6cm length x 0.4cm width x 0.5cm depth; 0.188cm^2 area and 0.094cm^3 volume. There is  Fat Layer (Subcutaneous Tissue) exposed. There is no tunneling or undermining noted. There is a medium amount of serosanguineous drainage noted. The wound margin is distinct with the outline attached to the wound base. There is large (67-100%) red, pink granulation within the wound bed. There is no necrotic [name], [name] ([id number]) [id number] Page 6 of 9 tissue within the wound bed. The periwound skin appearance exhibited: Callus. The periwound skin appearance did not exhibit: Crepitus, Excoriation, Induration, Rash, Scarring, Dry/Scaly, Maceration, Atrophie Blanche, Cyanosis, Ecchymosis, Hemosiderin Staining, Mottled, Pallor, Rubor, Erythema. 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 I recommended continuing Hydrofera Blue with antibiotic ointment under the total contact cast. Since he was having discomfort with size 3 TCC based on Size we will go up to a size 4. Follow-up in 1 week. Procedures Wound #1 Pre-procedure diagnosis of  Wound #1 is a Neuropathic Ulcer-Non Diabetic located on the Left T Great . There was a T Programmer, multimedia Procedure by Bjorn Pippin, Jazlin Tapscott, DO. Post procedure Diagnosis Wound #1: Same as Pre-Procedure Wound #2 Pre-procedure diagnosis of Wound #2 is an Open Surgical Wound located on the [place] . There was a T Programmer, multimedia Procedure by Enzo Montgomery, DO. Post procedure Diagnosis Wound #2: Same as Pre-Procedure Plan Follow-up Appointments: Return Appointment in 1 week. - Dr. Heber [place] Tuesday 03/25/22 @ 9:30 w/ Dr. Heber [place] and Allayne Butcher Rm # 9 Return Appointment in 2 weeks. - Dr Heber [place] - Tuesday Anesthetic: (In clinic) Topical Lidocaine 4% applied to wound bed Bathing/ Shower/ Hygiene: May shower with protection but do not get wound dressing([name]) wet. Protect dressing([name]) with water repellant cover (for example, large plastic bag) or a cast cover and may then take shower. Edema Control - Lymphedema / SCD / Other: Elevate legs to the level of the heart or above for 30 minutes daily and/or when sitting for 3-4 times a day throughout the day. Avoid standing for long periods of time. Off-Loading: T Contact Cast to Left Lower Extremity - Size 3 otal The following medication([name]) was prescribed: lidocaine topical 4 % cream cream topical once daily for prior to debridement was prescribed at facility WOUND #1: - T Great Wound Laterality: Left oe Cleanser: Wound Cleanser 1 x Per Week/30 Days Discharge Instructions: Cleanse the wound with wound cleanser prior to applying a clean dressing using gauze sponges, not tissue or cotton balls. Cleanser: Byram Ancillary Kit - 15 Day Supply (Generic) 1 x Per Week/30 Days Discharge Instructions: Use supplies as instructed; Kit contains: (15) Saline Bullets; (15) 3x3 Gauze; 15 pr Gloves Topical: Gentamicin 1 x Per Week/30 Days Discharge Instructions: As directed by physician Topical: Mupirocin Ointment 1 x Per Week/30 Days Discharge Instructions:  Apply Mupirocin (Bactroban) as instructed Prim Dressing: Hydrofera Blue Ready Transfer Foam, 2.5x2.5 (in/in) (Generic) 1 x Per Week/30 Days ary Discharge Instructions: Apply directly to wound bed as directed Secondary Dressing: Optifoam Non-Adhesive Dressing, 4x4 in (Generic) 1 x Per Week/30 Days Discharge Instructions: Apply x1 layer donut to aid in offloading. Secondary Dressing: Woven Gauze Sponges 2x2 in (Generic) 1 x Per Week/30 Days Discharge Instructions: Apply over primary dressing as directed. Secured With: Child psychotherapist, Sterile 2x75 (in/in) (Generic) 1 x Per Week/30 Days Discharge Instructions: Secure with stretch gauze as directed. Secured With: 25M Medipore H Soft Cloth Surgical T ape, 4 x 10 (  in/yd) (Generic) 1 x Per Week/30 Days Discharge Instructions: Secure with tape as directed. WOUND #2: - Foot Wound Laterality: Plantar, Left Cleanser: Wound Cleanser 1 x Per Week/30 Days Discharge Instructions: Cleanse the wound with wound cleanser prior to applying a clean dressing using gauze sponges, not tissue or cotton balls. Cleanser: Byram Ancillary Kit - 15 Day Supply (Generic) 1 x Per Week/30 Days Discharge Instructions: Use supplies as instructed; Kit contains: (15) Saline Bullets; (15) 3x3 Gauze; 15 pr Gloves Topical: Gentamicin 1 x Per Week/30 Days Discharge Instructions: As directed by physician Topical: Mupirocin Ointment 1 x Per Week/30 Days Discharge Instructions: Apply Mupirocin (Bactroban) as instructed [name], [name] ([id number] Page 7 of 9 Prim Dressing: Hydrofera Blue Ready Transfer Foam, 2.5x2.5 (in/in) (Generic) 1 x Per Week/30 Days ary Discharge Instructions: Apply directly to wound bed as directed Secondary Dressing: Optifoam Non-Adhesive Dressing, 4x4 in (Generic) 1 x Per Week/30 Days Discharge Instructions: Apply x1 layer donut to aid in offloading. Secondary Dressing: Woven Gauze Sponges 2x2 in  (Generic) 1 x Per Week/30 Days Discharge Instructions: Apply over primary dressing as directed. Secured With: Child psychotherapist, Sterile 2x75 (in/in) (Generic) 1 x Per Week/30 Days Discharge Instructions: Secure with stretch gauze as directed. Secured With: 47M Medipore H Soft Cloth Surgical T ape, 4 x 10 (in/yd) (Generic) 1 x Per Week/30 Days Discharge Instructions: Secure with tape as directed. 1. Hydrofera Blue and antibiotic ointment 2. T contact cast placed in standard fashion otal 3. Follow-up in 1 week Electronic Signature([name]) Signed: 03/18/2022 10:46:21 AM By: Kalman Shan DO Entered By: Kalman Shan on 03/18/2022 10:45:31 -------------------------------------------------------------------------------- HxROS Details Patient Name: Date of Service: John Kelp. 03/18/2022 9:00 A M Medical Record Number: [id number] Patient Account Number: [id number] Date of Birth/Sex: Treating RN: January 16, 1966 (57 y.o. M) Primary Care Provider: [name] Other Clinician: Referring Provider: Treating Provider/Extender: [name] in Treatment: 5 Hematologic/Lymphatic Medical History: Positive for: Lymphedema - pumps use daily Respiratory Medical History: Positive for: Sleep Apnea - CPAP Cardiovascular Medical History: Positive for: Hypertension; Peripheral Venous Disease - needs ablation on left leg Musculoskeletal Medical History: Positive for: Osteoarthritis Negative for: Osteomyelitis Past Medical History Notes: abscess to left foot 3rd toe 11/28/2022 Neurologic Medical History: Positive for: Neuropathy Immunizations Implantable Devices No devices added Hospitalization / Surgery History Type of Hospitalization/Surgery 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 [name], [name] ([id number]) [id number].pdf Page 8 of 9 Family and Social History Heart Disease: Yes - Mother; Lung Disease:  Yes - Mother; Current every day smoker - 10 cig a day; Alcohol Use: Never; Drug Use: No History; Caffeine Use: Never; Financial Concerns: No; Food, Clothing or Shelter Needs: No; Support System Lacking: No; Transportation Concerns: No Electronic Signature([name]) Signed: 03/18/2022 10:46:21 AM By: Kalman Shan DO Entered By: Kalman Shan on 03/18/2022 10:42:28 -------------------------------------------------------------------------------- Total Contact Cast Details Patient Name: Date of Service: [name], [name] 03/18/2022 9:00 A M Medical Record Number: [id number] Patient Account Number: [id number] Date of Birth/Sex: Treating RN: 08-Jan-1966 (57 y.o. Mare Ferrari Primary Care Provider: [name] Other Clinician: Referring Provider: Treating Provider/Extender: [name] in Treatment: 5 T Contact Cast Applied for Wound Assessment: otal Wound #1 Left T Great oe Performed By: Physician Kalman Shan, DO Post Procedure Diagnosis Same as Pre-procedure Electronic Signature([name]) Signed: 03/18/2022 10:46:21 AM By: Kalman Shan DO Signed: 03/20/2022 1:42:30 PM By: Sharyn Creamer RN, BSN Entered By: Sharyn Creamer on 03/18/2022 10:27:52 -------------------------------------------------------------------------------- Total Contact Cast Details Patient Name: Date of  Service: [name], [name] 03/18/2022 9:00 A M Medical Record Number: [id number] Patient Account Number: [id number] Date of Birth/Sex: Treating RN: [birth year] (57 y.o. Mare Ferrari Primary Care Provider: [name] Other Clinician: Referring Provider: Treating Provider/Extender: [name] in Treatment: 5 T Contact Cast Applied for Wound Assessment: otal Wound #2 Left,Plantar Foot Performed By: Physician Kalman Shan, DO Post Procedure Diagnosis Same as Pre-procedure Electronic Signature([name]) Signed: 03/18/2022 10:46:21 AM By:  Kalman Shan DO Signed: 03/20/2022 1:42:30 PM By: Sharyn Creamer RN, BSN Entered By: Sharyn Creamer on 03/18/2022 10:27:52 SuperBill Details -------------------------------------------------------------------------------- John Kelp ([id number]) [id number] Page 9 of 9 Patient Name: Date of Service: [name], MILAND 03/18/2022 Medical Record Number: [id number] Patient Account Number: [id number] Date of Birth/Sex: Treating RN: [birth year] (57 y.o. 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

## 2022-03-21 NOTE — Progress Notes (Signed)
[name], [name] ([id number]) [id number] Page 1 of 9 Visit Report for 03/18/2022 Arrival Information Details Patient Name: Date of Service: [name], [name] 03/18/2022 9:00 A M Medical Record Number: [id number] Patient Account Number: [id number] Date of Birth/Sex: Treating RN: November 21, 1965 (57 y.o. Mare Ferrari Primary Care Wilmar Prabhakar: [name] Other Clinician: Referring Emmalea Treanor: Treating Marshella Tello/Extender: [name] in Treatment: 5 Visit Information History Since Last Visit Added or deleted any medications: No Patient Arrived: Ambulatory Any new allergies or adverse reactions: No Arrival Time: 09:30 Had a fall or experienced change in No Accompanied By: SELF activities of daily living that may affect Transfer Assistance: None risk of falls: Patient Identification Verified: Yes Signs or symptoms of abuse/neglect since last visito No Secondary Verification Process Completed: Yes Hospitalized since last visit: No Patient Requires Transmission-Based No Implantable device outside of the clinic excluding No Precautions: cellular tissue based products placed in the center Patient Has Alerts: Yes since last visit: Patient Alerts: 11/27/21 ABI L 1.26 R1.09 Has Dressing in Place as Prescribed: Yes 11/27/2021 TBI L0.72 Has Footwear/Offloading in Place as Prescribed: Yes R0.66 Left: T Contact Cast otal Pain Present Now: No Electronic Signature([name]) Signed: 03/20/2022 1:42:30 PM By: Sharyn Creamer RN, BSN Entered By: Sharyn Creamer on 03/18/2022 09:31:18 -------------------------------------------------------------------------------- Encounter Discharge Information Details Patient Name: Date of Service: John Kelp. 03/18/2022 9:00 A M Medical Record Number: [id number] Patient Account Number: [id number] Date of Birth/Sex: Treating RN: 1965-03-27 (57 y.o. Mare Ferrari Primary Care Savon Bordonaro: [name] Other Clinician: Referring Ziaire Bieser: Treating Coni Homesley/Extender: [name] in Treatment: 5 Encounter Discharge Information Items Discharge Condition: Stable Ambulatory Status: Ambulatory Discharge Destination: Home Transportation: Private Auto Accompanied By: self Schedule Follow-up Appointment: Yes Clinical Summary of Care: Patient Declined Electronic Signature([name]) Signed: 03/20/2022 1:42:30 PM By: Sharyn Creamer RN, BSN Entered By: Sharyn Creamer on 03/18/2022 10:39:36 Thatch, Antionette Char ([id number] Page 2 of 9 -------------------------------------------------------------------------------- Lower Extremity Assessment Details Patient Name: Date of Service: [name], [name] 03/18/2022 9:00 A M Medical Record Number: [id number] Patient Account Number: [id number] Date of Birth/Sex: Treating RN: 1965-12-20 (57 y.o. Mare Ferrari Primary Care Richell Corker: [name] Other Clinician: Referring Alliah Boulanger: Treating Dvid Pendry/Extender: [name]: 5 Edema Assessment Assessed: [Left: No] [Right: No] Edema: [Left: Ye] [Right: [name]] Calf Left: Right: Point of Measurement: 43 cm From Medial Instep 42.5 cm Ankle Left: Right: Point of Measurement: 11 cm From Medial Instep 27.5 cm Vascular Assessment Pulses: Dorsalis Pedis Palpable: [Left:Yes] Electronic Signature([name]) Signed: 03/20/2022 1:42:30 PM By: Sharyn Creamer RN, BSN Entered By: Sharyn Creamer on 03/18/2022 09:43:43 -------------------------------------------------------------------------------- Multi Wound Chart Details Patient Name: Date of Service: John Kelp. 03/18/2022 9:00 A M Medical Record Number: [id number] Patient Account Number: [id number] Date of Birth/Sex: Treating RN: 05-Jun-1965 (57 y.o. M) Primary Care Noelene Gang: [name] Other Clinician: Referring Mechille Varghese: Treating  Jimmie Rueter/Extender: [name] in Treatment: 5 Vital Signs Height(in): 75 Pulse(bpm): 71 Weight(lbs): 220 Blood Pressure(mmHg): 134/86 Body Mass Index(BMI): 27.5 Temperature(F): 98.5 Respiratory Rate(breaths/min): 18 [1:Photos:] [N/A:N/A] Left T Great oe Left, Plantar Foot N/A Wound Location: Shear/Friction Surgical Injury N/A Wounding Event: [name], [name] ([id number]) [id number].pdf Page 3 of 9 Neuropathic Ulcer-Non Diabetic Open Surgical Wound N/A Primary Etiology: Lymphedema, Sleep Apnea, Lymphedema, Sleep Apnea, N/A Comorbid History: Hypertension, Peripheral Venous Hypertension, Peripheral Venous Disease, Osteoarthritis, Neuropathy Disease, Osteoarthritis, Neuropathy 01/28/2022 01/14/2022 N/A Date Acquired: 5 5 N/A Weeks of Treatment: Open Open N/A Wound Status: No No N/A Wound Recurrence:  Yes Yes N/A Pending A mputation on Presentation: 0.3x0.3x0.1 0.6x0.4x0.5 N/A Measurements L x W x D (cm) 0.071 0.188 N/A A (cm) : rea 0.007 0.094 N/A Volume (cm) : 94.10% -59.30% N/A % Reduction in A rea: 94.20% -291.70% N/A % Reduction in Volume: Full Thickness Without Exposed Full Thickness Without Exposed N/A Classification: Support Structures Support Structures Medium Medium N/A Exudate Amount: Serosanguineous Serosanguineous N/A Exudate Type: red, brown red, brown N/A Exudate Color: Distinct, outline attached Distinct, outline attached N/A Wound Margin: Large (67-100%) Large (67-100%) N/A Granulation Amount: Pink Red, Pink N/A Granulation Quality: None Present (0%) None Present (0%) N/A Necrotic Amount: Fat Layer (Subcutaneous Tissue): Yes Fat Layer (Subcutaneous Tissue): Yes N/A Exposed Structures: Fascia: No Fascia: No Tendon: No Tendon: No Muscle: No Muscle: No Joint: No Joint: No Bone: No Bone: No Large (67-100%) None N/A Epithelialization: Callus: Yes Callus: Yes N/A Periwound  Skin Texture: Excoriation: No Excoriation: No Induration: No Induration: No Crepitus: No Crepitus: No Rash: No Rash: No Scarring: No Scarring: No Maceration: No Maceration: No N/A Periwound Skin Moisture: Dry/Scaly: No Dry/Scaly: No Atrophie Blanche: No Atrophie Blanche: No N/A Periwound Skin Color: Cyanosis: No Cyanosis: No Ecchymosis: No Ecchymosis: No Erythema: No Erythema: No Hemosiderin Staining: No Hemosiderin Staining: No Mottled: No Mottled: No Pallor: No Pallor: No Rubor: No Rubor: No No Abnormality N/A N/A Temperature: T Contact Cast otal T Contact Cast otal N/A Procedures Performed: Treatment Notes Wound #1 (Toe Great) Wound Laterality: Left Cleanser Wound Cleanser Discharge Instruction: Cleanse the wound with wound cleanser prior to applying a clean dressing using gauze sponges, not tissue or cotton balls. Byram Ancillary Kit - 15 Day Supply Discharge Instruction: Use supplies as instructed; Kit contains: (15) Saline Bullets; (15) 3x3 Gauze; 15 pr Gloves Peri-Wound Care Topical Gentamicin Discharge Instruction: As directed by physician Mupirocin Ointment Discharge Instruction: Apply Mupirocin (Bactroban) as instructed Primary Dressing Hydrofera Blue Ready Transfer Foam, 2.5x2.5 (in/in) Discharge Instruction: Apply directly to wound bed as directed Secondary Dressing Optifoam Non-Adhesive Dressing, 4x4 in Discharge Instruction: Apply x1 layer donut to aid in offloading. Woven Gauze Sponges 2x2 in Discharge Instruction: Apply over primary dressing as directed. Secured With Conforming Stretch Gauze Bandage, Sterile 2x75 (in/in) Discharge Instruction: Secure with stretch gauze as directed. [name], [name] ([id number]) [id number] Page 4 of 9 41M Medipore H Soft Cloth Surgical T ape, 4 x 10 (in/yd) Discharge Instruction: Secure with tape as directed. Compression Wrap Compression Stockings Add-Ons Wound #2 (Foot)  Wound Laterality: Plantar, Left Cleanser Wound Cleanser Discharge Instruction: Cleanse the wound with wound cleanser prior to applying a clean dressing using gauze sponges, not tissue or cotton balls. Byram Ancillary Kit - 15 Day Supply Discharge Instruction: Use supplies as instructed; Kit contains: (15) Saline Bullets; (15) 3x3 Gauze; 15 pr Gloves Peri-Wound Care Topical Gentamicin Discharge Instruction: As directed by physician Mupirocin Ointment Discharge Instruction: Apply Mupirocin (Bactroban) as instructed Primary Dressing Hydrofera Blue Ready Transfer Foam, 2.5x2.5 (in/in) Discharge Instruction: Apply directly to wound bed as directed Secondary Dressing Optifoam Non-Adhesive Dressing, 4x4 in Discharge Instruction: Apply x1 layer donut to aid in offloading. Woven Gauze Sponges 2x2 in Discharge Instruction: Apply over primary dressing as directed. Secured With Conforming Stretch Gauze Bandage, Sterile 2x75 (in/in) Discharge Instruction: Secure with stretch gauze as directed. 41M Medipore H Soft Cloth Surgical T ape, 4 x 10 (in/yd) Discharge Instruction: Secure with tape as directed. Compression Wrap Compression Stockings Add-Ons Electronic Signature([name]) Signed: 03/18/2022 10:46:21 AM By: Kalman Shan DO Entered By: Kalman Shan on 03/18/2022 10:40:15 --------------------------------------------------------------------------------  Multi-Disciplinary Care Plan Details Patient Name: Date of Service: [name], [name] 03/18/2022 9:00 A M Medical Record Number: [id number] Patient Account Number: [id number] Date of Birth/Sex: Treating RN: 06/12/1965 (57 y.o. Mare Ferrari Primary Care Evaristo Tsuda: [name] Other Clinician: Referring Katanya Schlie: Treating Jaheim Canino/Extender: [name] in Treatment: [address] ASAIAH, GUMAN [name] ([id number]) [id number] Page 5 of 9 Peripheral Neuropathy Nursing  Diagnoses: Potential alteration in peripheral tissue perfusion (select prior to confirmation of diagnosis) Goals: Patient/caregiver will verbalize understanding of disease process and disease management Date Initiated: 02/11/2022 Target Resolution Date: 03/28/2022 Goal Status: Active Interventions: Assess signs and symptoms of neuropathy upon admission and as needed Provide education on Management of Neuropathy upon discharge from the [place]: Patient referred for customized footwear/offloading : 02/11/2022 Notes: Wound/Skin Impairment Nursing Diagnoses: Knowledge deficit related to ulceration/compromised skin integrity Goals: Patient/caregiver will verbalize understanding of skin care regimen Date Initiated: 02/11/2022 Target Resolution Date: 03/28/2022 Goal Status: Active Interventions: Assess patient/caregiver ability to perform ulcer/skin care regimen upon admission and as needed Assess ulceration([name]) every visit Provide education on smoking Provide education on ulcer and skin care Treatment Activities: Skin care regimen initiated : 02/11/2022 Smoking cessation education : 02/11/2022 Topical wound management initiated : 02/11/2022 Notes: Electronic Signature([name]) Signed: 03/20/2022 1:42:30 PM By: Sharyn Creamer RN, BSN Entered By: Sharyn Creamer on 03/18/2022 09:50:44 -------------------------------------------------------------------------------- Pain Assessment Details Patient Name: Date of Service: John Kelp. 03/18/2022 9:00 [place] Record Number: [id number] Patient Account Number: [id number] Date of Birth/Sex: Treating RN: 02/21/1965 (57 y.o. Mare Ferrari Primary Care Jacorion Klem: [name] Other Clinician: Referring Derron Pipkins: Treating Adreena Willits/Extender: [name]: 5 Active Problems Location of Pain Severity and Description of Pain Patient Has Paino No Site Locations [place],  [place] [name] ([id number]) [id number] Page 6 of 9 Pain Management and Medication Current Pain Management: Electronic Signature([name]) Signed: 03/20/2022 1:42:30 PM By: Sharyn Creamer RN, BSN Entered By: Sharyn Creamer on 03/18/2022 09:31:27 -------------------------------------------------------------------------------- Patient/Caregiver Education Details Patient Name: Date of Service: [name], [name]. 2/20/2024andnbsp9:00 [place] Record Number: [id number] Patient Account Number: [id number] Date of Birth/Gender: Treating RN: 1965/06/24 (57 y.o. Mare Ferrari Primary Care Physician: [name] Other Clinician: Referring Physician: Treating Physician/Extender: [name] in Treatment: 5 Education Assessment Education Provided To: Patient Education Topics Provided Wound/Skin Impairment: Methods: Explain/Verbal Responses: State content correctly Electronic Signature([name]) Signed: 03/20/2022 1:42:30 PM By: Sharyn Creamer RN, BSN Entered By: Sharyn Creamer on 03/18/2022 09:51:00 -------------------------------------------------------------------------------- Wound Assessment Details Patient Name: Date of Service: John Kelp. 03/18/2022 9:00 A M Medical Record Number: [id number] Patient Account Number: [id number] Date of Birth/Sex: Treating RN: [birth year] (57 y.o. Mare Ferrari Primary Care Nettye Flegal: [name] Other Clinician: Referring Shatori Bertucci: Treating Valente Fosberg/Extender: Hattie Perch [name], [name] ([id number]) [id number] Page 7 of 9 Weeks in Treatment: 5 Wound Status Wound Number: 1 Primary Neuropathic Ulcer-Non Diabetic Etiology: Wound Location: Left T Great oe Wound Open Wounding Event: Shear/Friction Status: Date Acquired: 01/28/2022 Comorbid Lymphedema, Sleep Apnea, Hypertension, Peripheral Venous Weeks Of Treatment: 5 History:  Disease, Osteoarthritis, Neuropathy Clustered Wound: No Pending Amputation On Presentation Photos Wound Measurements Length: (cm) 0.3 Width: (cm) 0.3 Depth: (cm) 0.1 Area: (cm) 0.071 Volume: (cm) 0.007 % Reduction in Area: 94.1% % Reduction in Volume: 94.2% Epithelialization: Large (67-100%) Tunneling: No Undermining: No Wound Description Classification: Full Thickness Without Exposed Support Structures Wound Margin: Distinct, outline attached Exudate Amount: Medium Exudate Type: Serosanguineous Exudate Color: red, brown Foul Odor After  Cleansing: No Slough/Fibrino Yes Wound Bed Granulation Amount: Large (67-100%) Exposed Structure Granulation Quality: Pink Fascia Exposed: No Necrotic Amount: None Present (0%) Fat Layer (Subcutaneous Tissue) Exposed: Yes Tendon Exposed: No Muscle Exposed: No Joint Exposed: No Bone Exposed: No Periwound Skin Texture Texture Color No Abnormalities Noted: No No Abnormalities Noted: No Callus: Yes Atrophie Blanche: No Crepitus: No Cyanosis: No Excoriation: No Ecchymosis: No Induration: No Erythema: No Rash: No Hemosiderin Staining: No Scarring: No Mottled: No Pallor: No Moisture Rubor: No No Abnormalities Noted: No Dry / Scaly: No Temperature / Pain Maceration: No Temperature: No Abnormality Treatment Notes Wound #1 (Toe Great) Wound Laterality: Left Cleanser Wound Cleanser Discharge Instruction: Cleanse the wound with wound cleanser prior to applying a clean dressing using gauze sponges, not tissue or cotton balls. Byram Ancillary Kit - 15 Day Supply Discharge Instruction: Use supplies as instructed; Kit contains: (15) Saline Bullets; (15) 3x3 Gauze; 15 pr [place], [place] ([id number]) [id number] Page 8 of 9 Topical Gentamicin Discharge Instruction: As directed by physician Mupirocin Ointment Discharge Instruction: Apply Mupirocin (Bactroban) as instructed Primary  Dressing Hydrofera Blue Ready Transfer Foam, 2.5x2.5 (in/in) Discharge Instruction: Apply directly to wound bed as directed Secondary Dressing Optifoam Non-Adhesive Dressing, 4x4 in Discharge Instruction: Apply x1 layer donut to aid in offloading. Woven Gauze Sponges 2x2 in Discharge Instruction: Apply over primary dressing as directed. Secured With Conforming Stretch Gauze Bandage, Sterile 2x75 (in/in) Discharge Instruction: Secure with stretch gauze as directed. 61M Medipore H Soft Cloth Surgical T ape, 4 x 10 (in/yd) Discharge Instruction: Secure with tape as directed. Compression Wrap Compression Stockings Add-Ons Electronic Signature([name]) Signed: 03/20/2022 1:42:30 PM By: Sharyn Creamer RN, BSN Entered By: Sharyn Creamer on 03/18/2022 09:49:08 -------------------------------------------------------------------------------- Wound Assessment Details Patient Name: Date of Service: John Kelp. 03/18/2022 9:00 A M Medical Record Number: [id number] Patient Account Number: [id number] Date of Birth/Sex: Treating RN: [birth year] (57 y.o. Mare Ferrari Primary Care Oriel Ojo: [name] Other Clinician: Referring Keevin Panebianco: Treating Renatha Rosen/Extender: [name]: 5 Wound Status Wound Number: 2 Primary Open Surgical Wound Etiology: Wound Location: Left, Plantar Foot Wound Open Wounding Event: Surgical Injury Status: Date Acquired: 01/14/2022 Comorbid Lymphedema, Sleep Apnea, Hypertension, Peripheral Venous Weeks Of Treatment: 5 History: Disease, Osteoarthritis, Neuropathy Clustered Wound: No Pending Amputation On Presentation Photos Wound Measurements [name], [name] ([id number]) Length: (cm) 0.6 Width: (cm) 0.4 Depth: (cm) 0.5 Area: (cm) 0. Volume: (cm) 0. [id number] Page 9 of 9 % Reduction in Area: -59.3% % Reduction in Volume: -291.7% Epithelialization: None 188 Tunneling:  No 094 Undermining: No Wound Description Classification: Full Thickness Without Exposed Suppor Wound Margin: Distinct, outline attached Exudate Amount: Medium Exudate Type: Serosanguineous Exudate Color: red, brown t Structures Foul Odor After Cleansing: No Slough/Fibrino Yes Wound Bed Granulation Amount: Large (67-100%) Exposed Structure Granulation Quality: Red, Pink Fascia Exposed: No Necrotic Amount: None Present (0%) Fat Layer (Subcutaneous Tissue) Exposed: Yes Tendon Exposed: No Muscle Exposed: No Joint Exposed: No Bone Exposed: No Periwound Skin Texture Texture Color No Abnormalities Noted: No No Abnormalities Noted: No Callus: Yes Atrophie Blanche: No Crepitus: No Cyanosis: No Excoriation: No Ecchymosis: No Induration: No Erythema: No Rash: No Hemosiderin Staining: No Scarring: No Mottled: No Pallor: No Moisture Rubor: No No Abnormalities Noted: No Dry / Scaly: No Maceration: No Electronic Signature([name]) Signed: 03/20/2022 1:42:30 PM By: Sharyn Creamer RN, BSN Entered By: Sharyn Creamer on 03/18/2022 09:49:45 -------------------------------------------------------------------------------- Vitals Details Patient Name: Date of Service: John Kelp. 03/18/2022 9:00 [name]  Record Number: [id number] Patient Account Number: [id number] Date of Birth/Sex: Treating RN: April 19, 1965 (56 y.o. Mare Ferrari Primary Care Zevin Nevares: [name] Other Clinician: Referring Janis Sol: Treating Kamil Hanigan/Extender: [name] in Treatment: 5 Vital Signs Time Taken: 09:24 Temperature (F): 98.5 Height (in): 75 Pulse (bpm): 71 Weight (lbs): 220 Respiratory Rate (breaths/min): 18 Body Mass Index (BMI): 27.5 Blood Pressure (mmHg): 134/86 Reference Range: 80 - 120 mg / dl Electronic Signature([name]) Signed: 03/20/2022 1:42:30 PM By: Sharyn Creamer RN, BSN Entered By: Sharyn Creamer on 03/18/2022 [id number]

## 2022-03-24 DIAGNOSIS — Z125 Encounter for screening for malignant neoplasm of prostate: Secondary | ICD-10-CM | POA: Diagnosis not present

## 2022-03-24 DIAGNOSIS — G4733 Obstructive sleep apnea (adult) (pediatric): Secondary | ICD-10-CM | POA: Diagnosis not present

## 2022-03-24 DIAGNOSIS — R600 Localized edema: Secondary | ICD-10-CM | POA: Diagnosis not present

## 2022-03-24 DIAGNOSIS — Z0000 Encounter for general adult medical examination without abnormal findings: Secondary | ICD-10-CM | POA: Diagnosis not present

## 2022-03-24 DIAGNOSIS — E782 Mixed hyperlipidemia: Secondary | ICD-10-CM | POA: Diagnosis not present

## 2022-03-25 ENCOUNTER — Encounter (HOSPITAL_BASED_OUTPATIENT_CLINIC_OR_DEPARTMENT_OTHER): Payer: BC Managed Care – PPO | Admitting: Internal Medicine

## 2022-03-25 DIAGNOSIS — I10 Essential (primary) hypertension: Secondary | ICD-10-CM | POA: Diagnosis not present

## 2022-03-25 DIAGNOSIS — M1990 Unspecified osteoarthritis, unspecified site: Secondary | ICD-10-CM | POA: Diagnosis not present

## 2022-03-25 DIAGNOSIS — L0390 Cellulitis, unspecified: Secondary | ICD-10-CM | POA: Diagnosis not present

## 2022-03-25 DIAGNOSIS — L97522 Non-pressure chronic ulcer of other part of 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: Secondary | ICD-10-CM | POA: Diagnosis not present

## 2022-03-25 DIAGNOSIS — G9009 Other idiopathic peripheral autonomic neuropathy: Secondary | ICD-10-CM | POA: Diagnosis not present

## 2022-03-25 DIAGNOSIS — L02612 Cutaneous abscess of left foot: Secondary | ICD-10-CM | POA: Diagnosis not present

## 2022-03-25 DIAGNOSIS — G629 Polyneuropathy, unspecified: Secondary | ICD-10-CM | POA: Diagnosis not present

## 2022-03-25 DIAGNOSIS — F1120 Opioid dependence, uncomplicated: Secondary | ICD-10-CM | POA: Diagnosis not present

## 2022-03-25 DIAGNOSIS — I872 Venous insufficiency (chronic) (peripheral): Secondary | ICD-10-CM | POA: Diagnosis not present

## 2022-03-25 DIAGNOSIS — R600 Localized edema: Secondary | ICD-10-CM | POA: Diagnosis not present

## 2022-03-26 DIAGNOSIS — F1120 Opioid dependence, uncomplicated: Secondary | ICD-10-CM | POA: Diagnosis not present

## 2022-03-26 NOTE — Progress Notes (Signed)
John, [name] [name] ([id number]) [id number] Page 1 of 9 Visit Report for 03/25/2022 Arrival Information Details Patient Name: Date of Service: [name], [name] 03/25/2022 9:30 A M Medical Record Number: [id number] Patient Account Number: [id number] Date of Birth/Sex: Treating RN: 03/20/1965 (57 y.o. [name], [name]: [name] Other Clinician: Referring [name]: Treating [name]/Extender: [name] in Treatment: 6 Visit Information History Since Last Visit Added or deleted any medications: No Patient Arrived: Ambulatory Any new allergies or adverse reactions: No Arrival Time: 09:47 Had a fall or experienced change in No Accompanied By: self activities of daily living that may affect Transfer Assistance: None risk of falls: Patient Identification Verified: Yes Signs or symptoms of abuse/neglect since last visito No Secondary Verification Process Completed: Yes Hospitalized since last visit: No Patient Requires Transmission-Based No Implantable device outside of the clinic excluding No Precautions: cellular tissue based products placed in the center Patient Has Alerts: Yes since last visit: Patient Alerts: 11/27/21 ABI L 1.26 R1.09 Has Dressing in Place as Prescribed: Yes 11/27/2021 TBI L0.72 Has Footwear/Offloading in Place as Prescribed: Yes R0.66 Left: T Contact Cast otal Pain Present Now: No Electronic Signature([name]) Signed: 03/25/2022 3:29:19 PM By: John Hammock RN Entered By: [name] on 03/25/2022 09:47:58 -------------------------------------------------------------------------------- Lower Extremity Assessment Details Patient Name: Date of Service: [name] 03/25/2022 9:30 A M Medical Record Number: [id number] Patient Account Number: [id number] Date of Birth/Sex: Treating RN: 10-Mar-1965 (57 y.o. [name], John Primary Care [name]: [name] Other Clinician: Referring [name]: Treating [name]/Extender: [name] in Treatment: 6 Edema Assessment Assessed: [name]: Yes] [name]: No] Edema: [Left: Ye] [Right: [name]] Calf Left: Right: Point of Measurement: 43 cm From Medial Instep 42.5 cm Ankle Left: Right: Point of Measurement: 11 cm From Medial Instep 27.5 cm Vascular Assessment Pulses: Dorsalis Pedis Palpable: [Left:Yes] Posterior Tibial Palpable: [Left:Yes] Electronic Signature([name]) Signed: 03/25/2022 3:29:19 PM By: John Hammock RN Entered By: [name] on 03/25/2022 09:47:36 [name] ([id number]) [id number] Page 2 of 9 -------------------------------------------------------------------------------- Multi Wound Chart Details Patient Name: Date of Service: [name], [name] 03/25/2022 9:30 A M Medical Record Number: [id number] Patient Account Number: [id number] Date of Birth/Sex: Treating RN: 1965-05-25 (57 y.o. 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 N/A Primary Etiology: Lymphedema, Sleep Apnea, Lymphedema, Sleep Apnea, N/A Comorbid History: Hypertension, Peripheral Venous Hypertension, Peripheral Venous Disease, Osteoarthritis, Neuropathy Disease, Osteoarthritis, Neuropathy 01/28/2022 01/14/2022 N/A Date Acquired: 6 6 N/A [name] of Treatment: Open Open N/A Wound Status: No No N/A Wound Recurrence: Yes Yes N/A Pending A mputation  on Presentation: 0.2x0.3x0.2 0.3x0.2x0.7 N/A Measurements L x W x D (cm) 0.047 0.047 N/A A (cm) : rea 0.009 0.033 N/A Volume (cm) : 96.10% 60.20% N/A % Reduction in A rea: 92.60% -37.50% N/A % Reduction in Volume: 12 12 Starting Position 1 (o'clock): 12 6 Ending Position 1 (o'clock): 0.2 0.4 Maximum Distance 1 (cm): Yes Yes N/A Undermining: Full Thickness Without Exposed Full Thickness Without Exposed N/A Classification: Support Structures Support Structures Medium Medium N/A Exudate A mount: Serosanguineous Serosanguineous N/A Exudate Type: red, brown red, brown N/A Exudate Color: Distinct, outline attached Distinct, outline attached N/A Wound Margin:  Large (67-100%) Large (67-100%) N/A Granulation A mount: Pink Red, Pink N/A Granulation Quality: None Present (0%) None Present (0%) N/A Necrotic A mount: Fat Layer (Subcutaneous Tissue): Yes Fat Layer (Subcutaneous Tissue): Yes N/A Exposed Structures: Fascia: No Fascia: No Tendon: No Tendon: No Muscle: No Muscle: No Joint: No Joint: No Bone: No Bone: No Large (67-100%) Small (1-33%) N/A Epithelialization: Debridement - Selective/Open Wound Debridement - Excisional N/A Debridement: Pre-procedure Verification/Time Out 10:23 10:23 N/A Taken: Lidocaine Lidocaine N/A Pain Control: Callus, Slough Subcutaneous, Slough N/A Tissue Debrided: Non-Viable Tissue Skin/Subcutaneous Tissue N/A Level: 0.1 0.06 N/A Debridement A (sq cm): rea Curette Curette N/A Instrument: Minimum Minimum N/A Bleeding: Pressure Pressure N/A Hemostasis A chieved: 0 0 N/A Procedural Pain: 0 0 N/A Post Procedural Pain: Procedure was tolerated well Procedure was tolerated well N/A Debridement Treatment Response: 0.2x0.3x0.2 0.3x0.2x0.7 N/A Post Debridement Measurements L x [name] Page 3 of 9 W x D (cm) 0.009 0.033 N/A Post Debridement Volume: (cm) Callus:  Yes Callus: Yes N/A Periwound Skin Texture: Excoriation: No Excoriation: No Induration: No Induration: No Crepitus: No Crepitus: No Rash: No Rash: No Scarring: No Scarring: No Maceration: No Maceration: No N/A Periwound Skin Moisture: Dry/Scaly: No Dry/Scaly: No Atrophie Blanche: No Atrophie Blanche: No N/A Periwound Skin Color: Cyanosis: No Cyanosis: No Ecchymosis: No Ecchymosis: No Erythema: No Erythema: No Hemosiderin Staining: No Hemosiderin Staining: No Mottled: No Mottled: No Pallor: No Pallor: No Rubor: No Rubor: No No Abnormality N/A N/A Temperature: Debridement Debridement N/A Procedures Performed: T Contact Cast otal T Contact Cast otal Treatment Notes Wound #1 (Toe Great) Wound Laterality: Left Cleanser Wound Cleanser Discharge Instruction: Cleanse the wound with wound cleanser prior to applying a clean dressing using gauze sponges, not tissue or cotton balls. Byram Ancillary Kit - 15 Day Supply Discharge Instruction: Use supplies as instructed; Kit contains: (15) Saline Bullets; (15) 3x3 Gauze; 15 pr Gloves Peri-Wound Care Topical Gentamicin Discharge Instruction: As directed by physician Mupirocin Ointment Discharge Instruction: Apply Mupirocin (Bactroban) as instructed Primary Dressing Hydrofera Blue Ready Transfer Foam, 2.5x2.5 (in/in) Discharge Instruction: Apply directly to wound bed as directed Secondary Dressing Optifoam Non-Adhesive Dressing, 4x4 in Discharge Instruction: Apply x1 layer donut to aid in offloading. Woven Gauze Sponges 2x2 in Discharge Instruction: Apply over primary dressing as directed. Secured With Conforming Stretch Gauze Bandage, Sterile 2x75 (in/in) Discharge Instruction: Secure with stretch gauze as directed. 18M Medipore H Soft Cloth Surgical T ape, 4 x 10 (in/yd) Discharge Instruction: Secure with tape as directed. Compression Wrap Compression Stockings Add-Ons Wound #2 (Foot) Wound Laterality: Plantar,  Left Cleanser Wound Cleanser Discharge Instruction: Cleanse the wound with wound cleanser prior to applying a clean dressing using gauze sponges, not tissue or cotton balls. Byram Ancillary Kit - 15 Day Supply Discharge Instruction: Use supplies as instructed; Kit contains: (15) Saline Bullets; (15) 3x3 Gauze; 15 pr Gloves Peri-Wound Care Topical Gentamicin Discharge Instruction: As directed by physician Mupirocin [name], [name] ([id number]) [id number] Page 4 of 9 Discharge Instruction: Apply Mupirocin (Bactroban) as instructed Primary Dressing Hydrofera Blue Ready Transfer Foam, 2.5x2.5 (in/in) Discharge Instruction: Apply directly to wound bed as directed Secondary Dressing Optifoam Non-Adhesive Dressing, 4x4 in Discharge Instruction: Apply x1 layer donut to aid in offloading. Woven Gauze Sponges 2x2 in Discharge Instruction: Apply over primary dressing as directed. Secured With Conforming Stretch Gauze Bandage, Sterile 2x75 (in/in) Discharge Instruction: Secure with stretch gauze as directed. 18M Medipore H Soft Cloth Surgical T ape, 4 x 10 (in/yd) Discharge Instruction: Secure  with tape as directed. Compression Wrap Compression Stockings Add-Ons Electronic Signature([name]) Signed: 03/25/2022 12:29:31 PM By: Kalman Shan DO Entered By: Kalman Shan on 03/25/2022 11:44:06 -------------------------------------------------------------------------------- Multi-Disciplinary Care Plan Details Patient Name: Date of Service: [name]. 03/25/2022 9:30 A M Medical Record Number: [id number] Patient Account Number: [id number] Date of Birth/Sex: Treating RN: [birth year] (58 y.o. [name], John Primary Care Anira Senegal: [name] Other Clinician: Referring Demian Maisel: Treating Jaina Morin/Extender: [name] in Treatment: 6 Active Inactive Peripheral Neuropathy Nursing Diagnoses: Potential  alteration in peripheral tissue perfusion (select prior to confirmation of diagnosis) Goals: Patient/caregiver will verbalize understanding of disease process and disease management Date Initiated: 02/11/2022 Target Resolution Date: 03/28/2022 Goal Status: Active Interventions: Assess signs and symptoms of neuropathy upon admission and as needed Provide education on Management of Neuropathy upon discharge from the [facility]: Patient referred for customized footwear/offloading : 02/11/2022 Notes: Wound/Skin Impairment Nursing Diagnoses: Knowledge deficit related to ulceration/compromised skin integrity Goals: Patient/caregiver will verbalize understanding of skin care regimen Date Initiated: 02/11/2022 Target Resolution Date: 03/28/2022 Goal Status: Active Interventions: [name], John ([id number]) [id number] Page 5 of 9 Assess patient/caregiver ability to perform ulcer/skin care regimen upon admission and as needed Assess ulceration([name]) every visit Provide education on smoking Provide education on ulcer and skin care Treatment Activities: Skin care regimen initiated : 02/11/2022 Smoking cessation education : 02/11/2022 Topical wound management initiated : 02/11/2022 Notes: Electronic Signature([name]) Signed: 03/25/2022 3:29:19 PM By: John Hammock RN Entered By: [name] on 03/25/2022 10:10:24 -------------------------------------------------------------------------------- Pain Assessment Details Patient Name: Date of Service: [name] 03/25/2022 9:30 A M Medical Record Number: [id number] Patient Account Number: [id number] Date of Birth/Sex: Treating RN: Jun 27, 1965 (57 y.o. [name], John Primary Care Murphy Duzan: [name] Other Clinician: Referring Shraga Custard: Treating Laresha Bacorn/Extender: [name] in Treatment: 6 Active Problems Location of Pain Severity and  Description of Pain Patient Has Paino No Site Locations Pain Management and Medication Current Pain Management: Electronic Signature([name]) Signed: 03/25/2022 3:29:19 PM By: John Hammock RN Entered By: [name] on 03/25/2022 09:47:30 -------------------------------------------------------------------------------- Patient/Caregiver Education Details Patient Name: Date of Service: [name] 2/27/2024andnbsp9:30 [facility] Record Number: [id number] Patient Account Number: [id number] Date of Birth/Gender: Treating RN: 1965/03/27 (57 y.o. Erie Noe Primary Care Physician: [name] Other Clinician: Referring Physician: Treating Physician/Extender: [name] in Treatment: 6 Education [address] [place], SORICH [name] ([id number]) [id number] Page 6 of 9 Education Provided To: Patient Education Topics Provided Wound/Skin Impairment: Methods: Explain/Verbal Responses: Reinforcements needed, State content correctly Electronic Signature([name]) Signed: 03/25/2022 3:29:19 PM By: John Hammock RN Entered By: [name] on 03/25/2022 10:10:37 -------------------------------------------------------------------------------- Wound Assessment Details Patient Name: Date of Service: [name] 03/25/2022 9:30 A M Medical Record Number: [id number] Patient Account Number: [id number] Date of Birth/Sex: Treating RN: [birth year] (57 y.o. [name], John Primary Care Mckoy Bhakta: [name] Other Clinician: Referring Ardath Lepak: Treating Sareen Randon/Extender: [name] in Treatment: 6 Wound Status Wound Number: 1 Primary Neuropathic Ulcer-Non Diabetic Etiology: Wound Location: Left T Great oe Wound Open Wounding Event: Shear/Friction Status: Date Acquired: 01/28/2022 Comorbid Lymphedema, Sleep Apnea, Hypertension, Peripheral Venous [name] Of Treatment:  6 History: Disease, Osteoarthritis, Neuropathy Clustered Wound: No Pending Amputation On Presentation Photos Wound Measurements Length: (cm) 0.2 Width: (cm) 0.3 Depth: (cm) 0.2 Area: (cm) 0.047 Volume: (cm) 0.009 % Reduction in Area: 96.1% % Reduction in Volume: 92.6% Epithelialization: Large (67-100%) Tunneling: No Undermining: Yes Starting Position (o'clock): 12 Ending Position (o'clock): 12 Maximum Distance: (  cm) 0.2 Wound Description Classification: Full Thickness Without Exposed Support Structures Wound Margin: Distinct, outline attached Exudate Amount: Medium Exudate Type: Serosanguineous Exudate Color: red, brown Foul Odor After Cleansing: No Slough/Fibrino Yes Wound Bed Granulation Amount: Large (67-100%) Exposed Structure Granulation Quality: Pink Fascia Exposed: No Necrotic Amount: None Present (0%) Fat Layer (Subcutaneous Tissue) Exposed: Yes Tendon Exposed: No Muscle Exposed: No Seckinger, [name] Page 7 of 9 Joint Exposed: No Bone Exposed: No Periwound Skin Texture Texture Color No Abnormalities Noted: No No Abnormalities Noted: No Callus: Yes Atrophie Blanche: No Crepitus: No Cyanosis: No Excoriation: No Ecchymosis: No Induration: No Erythema: No Rash: No Hemosiderin Staining: No Scarring: No Mottled: No Pallor: No Moisture Rubor: No No Abnormalities Noted: No Dry / Scaly: No Temperature / Pain Maceration: No Temperature: No Abnormality Treatment Notes Wound #1 (Toe Great) Wound Laterality: Left Cleanser Wound Cleanser Discharge Instruction: Cleanse the wound with wound cleanser prior to applying a clean dressing using gauze sponges, not tissue or cotton balls. Byram Ancillary Kit - 15 Day Supply Discharge Instruction: Use supplies as instructed; Kit contains: (15) Saline Bullets; (15) 3x3 Gauze; 15 pr Gloves Peri-Wound Care Topical Gentamicin Discharge Instruction: As directed by  physician Mupirocin Ointment Discharge Instruction: Apply Mupirocin (Bactroban) as instructed Primary Dressing Hydrofera Blue Ready Transfer Foam, 2.5x2.5 (in/in) Discharge Instruction: Apply directly to wound bed as directed Secondary Dressing Optifoam Non-Adhesive Dressing, 4x4 in Discharge Instruction: Apply x1 layer donut to aid in offloading. Woven Gauze Sponges 2x2 in Discharge Instruction: Apply over primary dressing as directed. Secured With Conforming Stretch Gauze Bandage, Sterile 2x75 (in/in) Discharge Instruction: Secure with stretch gauze as directed. 64M Medipore H Soft Cloth Surgical T ape, 4 x 10 (in/yd) Discharge Instruction: Secure with tape as directed. Compression Wrap Compression Stockings Add-Ons Electronic Signature([name]) Signed: 03/25/2022 3:29:19 PM By: John Hammock RN Entered By: [name] on 03/25/2022 10:05:38 -------------------------------------------------------------------------------- Wound Assessment Details Patient Name: Date of Service: AKARI, KONKEL 03/25/2022 9:30 A M Medical Record Number: [id number] Patient Account Number: [id number] Date of Birth/Sex: Treating RN: Oct 12, 1965 (57 y.o. [name], John Primary Care Adrielle Polakowski: [name] Other Clinician: Referring Kareemah Grounds: Treating Paiten Boies/Extender: [name] in Treatment: [address] [place] ([id number]) [id number] Page 8 of 9 Wound Status Wound Number: 2 Primary Neuropathic Ulcer-Non Diabetic Etiology: Wound Location: Left, Plantar Foot Wound Open Wounding Event: Surgical Injury Status: Date Acquired: 01/14/2022 Comorbid Lymphedema, Sleep Apnea, Hypertension, Peripheral Venous [name] Of Treatment: 6 History: Disease, Osteoarthritis, Neuropathy Clustered Wound: No Pending Amputation On Presentation Photos Wound Measurements Length: (cm) 0.3 Width: (cm) 0.2 Depth: (cm) 0.7 Area: (cm)  0.047 Volume: (cm) 0.033 % Reduction in Area: 60.2% % Reduction in Volume: -37.5% Epithelialization: Small (1-33%) Tunneling: No Undermining: Yes Starting Position (o'clock): 12 Ending Position (o'clock): 6 Maximum Distance: (cm) 0.4 Wound Description Classification: Full Thickness Without Exposed Support Structures Wound Margin: Distinct, outline attached Exudate Amount: Medium Exudate Type: Serosanguineous Exudate Color: red, brown Foul Odor After Cleansing: No Slough/Fibrino Yes Wound Bed Granulation Amount: Large (67-100%) Exposed Structure Granulation Quality: Red, Pink Fascia Exposed: No Necrotic Amount: None Present (0%) Fat Layer (Subcutaneous Tissue) Exposed: Yes Tendon Exposed: No Muscle Exposed: No Joint Exposed: No Bone Exposed: No Periwound Skin Texture Texture Color No Abnormalities Noted: No No Abnormalities Noted: No Callus: Yes Atrophie Blanche: No Crepitus: No Cyanosis: No Excoriation: No Ecchymosis: No Induration: No Erythema: No Rash: No Hemosiderin Staining: No Scarring: No Mottled: No Pallor: No Moisture Rubor: No No Abnormalities Noted: No Dry /  Scaly: No Maceration: No Treatment Notes Wound #2 (Foot) Wound Laterality: Plantar, Left Cleanser Wound Cleanser Discharge Instruction: Cleanse the wound with wound cleanser prior to applying a clean dressing using gauze sponges, not tissue or cotton balls. Byram Ancillary Kit - 15 Day Supply Discharge Instruction: Use supplies as instructed; Kit contains: (15) Saline Bullets; (15) 3x3 Gauze; 15 pr Gloves Bantz, Al [name] ([id number]) [phone].pdf Page 9 of 9 Peri-Wound Care Topical Gentamicin Discharge Instruction: As directed by physician Mupirocin Ointment Discharge Instruction: Apply Mupirocin (Bactroban) as instructed Primary Dressing Hydrofera Blue Ready Transfer Foam, 2.5x2.5 (in/in) Discharge Instruction: Apply directly to wound bed as  directed Secondary Dressing Optifoam Non-Adhesive Dressing, 4x4 in Discharge Instruction: Apply x1 layer donut to aid in offloading. Woven Gauze Sponges 2x2 in Discharge Instruction: Apply over primary dressing as directed. Secured With Conforming Stretch Gauze Bandage, Sterile 2x75 (in/in) Discharge Instruction: Secure with stretch gauze as directed. 20M Medipore H Soft Cloth Surgical T ape, 4 x 10 (in/yd) Discharge Instruction: Secure with tape as directed. Compression Wrap Compression Stockings Add-Ons Electronic Signature([name]) Signed: 03/25/2022 3:29:19 PM By: John Hammock RN Entered By: [name] on 03/25/2022 10:06:02 -------------------------------------------------------------------------------- Vitals Details Patient Name: Date of Service: [name]. 03/25/2022 9:30 A M Medical Record Number: [id number] Patient Account Number: [id number] Date of Birth/Sex: Treating RN: Jun 17, 1965 (57 y.o. [name], John Primary Care Nivin Braniff: [name] Other Clinician: Referring Shadai Mcclane: Treating Falisha Osment/Extender: [name] in Treatment: 6 Vital Signs Time Taken: 09:47 Temperature (F): 98.7 Height (in): 75 Pulse (bpm): 69 Weight (lbs): 220 Respiratory Rate (breaths/min): 17 Body Mass Index (BMI): 27.5 Blood Pressure (mmHg): 147/79 Reference Range: 80 - 120 mg / dl Electronic Signature([name]) Signed: 03/25/2022 3:29:19 PM By: John Hammock RN Entered By: [name] on 03/25/2022 09:47:25

## 2022-03-26 NOTE — Progress Notes (Signed)
[name] ([id number]) [id number] Page 1 of 10 Visit Report for 03/25/2022 Chief Complaint Document Details Patient Name: Date of Service: [name], [name] 03/25/2022 9:30 A M Medical Record Number: [id number] Patient Account Number: [id number] Date of Birth/Sex: Treating RN: [birth year] (57 y.o. M) Primary Care Provider: [name] Other Clinician: Referring Provider: Treating Provider/Extender: [name] in Treatment: 6 Information Obtained from: Patient Chief Complaint 02/11/2022 left great toe wound and fourth sub-met head wound Electronic Signature([name]) Signed: 03/25/2022 12:29:31 PM By: Kalman Shan DO Entered By: Kalman Shan on 03/25/2022 11:44:29 -------------------------------------------------------------------------------- Debridement Details Patient Name: Date of Service: [name]. 03/25/2022 9:30 A M Medical Record Number: [id number] Patient Account Number: [id number] Date of Birth/Sex: Treating RN: 02/17/1965 (57 y.o. [name], [name] Primary Care Provider: [name] Other Clinician: Referring Provider: Treating Provider/Extender: [name] in Treatment: 6 Debridement Performed for Assessment: Wound #1 Left T Great oe Performed By: Physician Kalman Shan, DO Debridement Type: Debridement Level of Consciousness (Pre-procedure): Awake and Alert Pre-procedure Verification/Time Out Yes - 10:23 Taken: Start Time: 10:23 Pain Control: Lidocaine T Area Debrided (L x W): otal 0.2 (cm) x 0.5 (cm) = 0.1 (cm) Tissue and other material debrided: Viable, Non-Viable, Callus, Slough, Slough Level: Non-Viable Tissue Debridement Description: Selective/Open Wound Instrument: Curette Bleeding: Minimum Hemostasis Achieved: Pressure End Time: 10:23 Procedural Pain: 0 Post Procedural Pain: 0 Response to Treatment: Procedure was tolerated  well Level of Consciousness (Post- Awake and Alert procedure): Post Debridement Measurements of Total Wound Length: (cm) 0.2 Width: (cm) 0.3 Depth: (cm) 0.2 Volume: (cm) 0.009 Character of Wound/Ulcer Post Debridement: Improved Post Procedure Diagnosis Same as Pre-procedure Electronic Signature([name]) Signed: 03/25/2022 12:29:31 PM By: Kalman Shan DO Signed: 03/25/2022 3:29:19 PM By: Rhae Hammock RN Entered By: Rhae Hammock on 03/25/2022 10:23:57 [name] ([id number]) [id number] Page 2 of 10 -------------------------------------------------------------------------------- Debridement Details Patient Name: Date of Service: [name], [name] 03/25/2022 9:30 A M Medical Record Number: [id number] Patient Account Number: [id number] Date of Birth/Sex: Treating RN: 10/17/1965 (57 y.o. [name], [name] Primary Care Provider: [name] Other Clinician: Referring Provider: Treating Provider/Extender: [name] in Treatment: 6 Debridement Performed for Assessment: Wound #2 Left,Plantar Foot Performed By: Physician Kalman Shan, DO Debridement Type: Debridement Level of Consciousness (Pre-procedure): Awake and Alert Pre-procedure Verification/Time Out Yes - 10:23 Taken: Start Time: 10:23 Pain Control: Lidocaine T Area Debrided (L x W): otal 0.3 (cm) x 0.2 (cm) = 0.06 (cm) Tissue and other material debrided: Viable, Non-Viable, Slough, Subcutaneous, Slough Level: Skin/Subcutaneous Tissue Debridement Description: Excisional Instrument: Curette Bleeding: Minimum Hemostasis Achieved: Pressure End Time: 10:23 Procedural Pain: 0 Post Procedural Pain: 0 Response to Treatment: Procedure was tolerated well Level of Consciousness (Post- Awake and Alert procedure): Post Debridement Measurements of Total Wound Length: (cm) 0.3 Width: (cm) 0.2 Depth: (cm) 0.7 Volume: (cm) 0.033 Character of  Wound/Ulcer Post Debridement: Improved Post Procedure Diagnosis Same as Pre-procedure Electronic Signature([name]) Signed: 03/25/2022 12:29:31 PM By: Kalman Shan DO Signed: 03/25/2022 3:29:19 PM By: Rhae Hammock RN Entered By: Rhae Hammock on 03/25/2022 10:24:33 -------------------------------------------------------------------------------- HPI Details Patient Name: Date of Service: [name]. 03/25/2022 9:30 A M Medical Record Number: [id number] Patient Account Number: [id number] Date of Birth/Sex: Treating RN: November 24, 1965 (57 y.o. M) Primary Care Provider: [name] Other Clinician: Referring Provider: Treating Provider/Extender: [name] in Treatment: 6 History of Present Illness HPI Description: 02/11/2022 [name] is a 57 year old male with a past medical history of current  tobacco user, OSA, venous insufficiency and peripheral neuropathy that presents the clinic for a 2-week history of ulcer to the left great toe and 2-93-monthhistory of nonhealing ulcer to the left plantar foot. He works in a [place] is on his feet all day. He wears steel toed boots. He is currently out of work due to his wounds. On 11/26/2021 he was admitted to the hospital for cellulitis and abscess to the left foot. 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 He states he has been on doxycycline for 10 weeks. He just completed this course. He uses regular tennis shoes for walking. He is not offloading the area. He is using Betadine for wound dressings. He has been following with Dr. PPosey Pronto podiatry who Referred to our clinic. 1/23; patient presents for follow-up. He has been using Medihoney and Hydrofera Blue to the wound beds Along with his surgical shoe and offloading peg assist. He has no issues or complaints today. He denies signs of infection. 1/30; patient presents for follow-up. He has been using  Medihoney and Hydrofera Blue to the wound beds. He has no issues or complaints today. Plan is for the total contact cast and patient is agreeable to move forward with this. 2/1; patient presents for follow-up. We have been using antibiotic ointment with Hydrofera Blue to the wound beds under the total contact cast. He presents [name]([id number] Page 3 of 10 today for his obligatory cast change. He had no issues with the cast. 2/6; patient presents for follow-up. We have been using antibiotic ointment with Hydrofera Blue to the wound beds under the total contact cast. He has no issues or complaints today. He has tolerated the cast well. 2/13; patient presents for follow-up. We have been using the total contact cast to the left lower extremity. We have been using Hydrofera Blue and antibiotic ointment to the wound beds. He has no issues or complaints today. 2/20; patient presents for follow-up. We were using the total contact cast along with Hydrofera Blue and antibiotic ointment. He states the cast was slightly uncomfortable over the past week. 2/27; Patient presents for follow-up. We have been using Hydrofera Blue with antibiotic ointment under the total contact cast. He tolerated the cast well and has no issues or complaints today. Electronic Signature([name]) Signed: 03/25/2022 12:29:31 PM By: HKalman ShanDO Entered By: HKalman Shanon 03/25/2022 11:46:14 -------------------------------------------------------------------------------- Physical Exam Details Patient Name: Date of Service: [name], GEARY2/27/2024 9:30 A M Medical Record Number: [id number] Account Number: [id number] of Birth/Sex: Treating RN: [birth year](57y.o. M) Primary Care Provider: RMerrilee SeashoreOther Clinician: Referring Provider: Treating Provider/Extender: HWandra Scotin Treatment:  6 Constitutional respirations regular, non-labored and within target range for patient.. Cardiovascular 2+ dorsalis pedis/posterior tibialis pulses. Psychiatric pleasant and cooperative. Notes Left foot: T the plantar aspect of the great toe there is an open wound with granulation tissue, slough and surrounding callus. T the plantar aspect of the foot o o there is an open wound with granulation tissue,slough, pocket of increased depth in the center. No probing to bone. No signs of surrounding soft tissue infection to any of the wound beds. Electronic Signature([name]) Signed: 03/25/2022 12:29:31 PM By: HKalman ShanDO Entered By: HKalman Shanon 03/25/2022 11:52:25 -------------------------------------------------------------------------------- Physician Orders Details Patient Name: Date of Service: RHelene [name] 03/25/2022 9:30 A M Medical Record Number: [id number] Account Number: [id number] of Birth/Sex: Treating RN: 601-Sep-1967(57y.o.  [place] Noe Primary Care Provider: [name] Other Clinician: Referring Provider: Treating Provider/Extender: [name] in Treatment: 6 Verbal / Phone Orders: No Diagnosis Coding Follow-up Appointments ppointment in 1 week. - Dr. Heber [place] and Allayne Butcher Rm # 9 Tuesday 04/01/22 @ 11:00 Return A ppointment in 2 weeks. - Dr Heber [place] and Allayne Butcher Rm # 9 Tuesday 04/08/22 @ 9:30 Return A Anesthetic (In clinic) Topical Lidocaine 4% applied to wound bed Bathing/ Shower/ Hygiene May shower with protection but do not get wound dressing([name]) wet. Protect dressing([name]) with water repellant cover (for example, large plastic bag) or a cast cover and may then take shower. Edema Control - Lymphedema / SCD / Other Elevate legs to the level of the heart or above for 30 minutes daily and/or when sitting for 3-4 times a day throughout the day. ANUAR, WESOLOSKI ([id number])  [id number] Page 4 of 10 Avoid standing for long periods of time. Off-Loading Total Contact Cast to Left Lower Extremity - Size 4 Wound Treatment Wound #1 - T Great oe Wound Laterality: Left Cleanser: Wound Cleanser 1 x Per Week/30 Days Discharge Instructions: Cleanse the wound with wound cleanser prior to applying a clean dressing using gauze sponges, not tissue or cotton balls. Cleanser: Byram Ancillary Kit - 15 Day Supply (Generic) 1 x Per Week/30 Days Discharge Instructions: Use supplies as instructed; Kit contains: (15) Saline Bullets; (15) 3x3 Gauze; 15 pr Gloves Topical: Gentamicin 1 x Per Week/30 Days Discharge Instructions: As directed by physician Topical: Mupirocin Ointment 1 x Per Week/30 Days Discharge Instructions: Apply Mupirocin (Bactroban) as instructed Prim Dressing: Hydrofera Blue Ready Transfer Foam, 2.5x2.5 (in/in) (Generic) 1 x Per Week/30 Days ary Discharge Instructions: Apply directly to wound bed as directed Secondary Dressing: Optifoam Non-Adhesive Dressing, 4x4 in (Generic) 1 x Per Week/30 Days Discharge Instructions: Apply x1 layer donut to aid in offloading. Secondary Dressing: Woven Gauze Sponges 2x2 in (Generic) 1 x Per Week/30 Days Discharge Instructions: Apply over primary dressing as directed. Secured With: Child psychotherapist, Sterile 2x75 (in/in) (Generic) 1 x Per Week/30 Days Discharge Instructions: Secure with stretch gauze as directed. Secured With: 40M Medipore H Soft Cloth Surgical T ape, 4 x 10 (in/yd) (Generic) 1 x Per Week/30 Days Discharge Instructions: Secure with tape as directed. Wound #2 - Foot Wound Laterality: Plantar, Left Cleanser: Wound Cleanser 1 x Per Week/30 Days Discharge Instructions: Cleanse the wound with wound cleanser prior to applying a clean dressing using gauze sponges, not tissue or cotton balls. Cleanser: Byram Ancillary Kit - 15 Day Supply (Generic) 1 x Per Week/30 Days Discharge  Instructions: Use supplies as instructed; Kit contains: (15) Saline Bullets; (15) 3x3 Gauze; 15 pr Gloves Topical: Gentamicin 1 x Per Week/30 Days Discharge Instructions: As directed by physician Topical: Mupirocin Ointment 1 x Per Week/30 Days Discharge Instructions: Apply Mupirocin (Bactroban) as instructed Prim Dressing: Hydrofera Blue Ready Transfer Foam, 2.5x2.5 (in/in) (Generic) 1 x Per Week/30 Days ary Discharge Instructions: Apply directly to wound bed as directed Secondary Dressing: Optifoam Non-Adhesive Dressing, 4x4 in (Generic) 1 x Per Week/30 Days Discharge Instructions: Apply x1 layer donut to aid in offloading. Secondary Dressing: Woven Gauze Sponges 2x2 in (Generic) 1 x Per Week/30 Days Discharge Instructions: Apply over primary dressing as directed. Secured With: Child psychotherapist, Sterile 2x75 (in/in) (Generic) 1 x Per Week/30 Days Discharge Instructions: Secure with stretch gauze as directed. Secured With: 40M Medipore H Soft Cloth Surgical T ape, 4 x 10 (in/yd) (Generic) 1 x Per Week/30  Days Discharge Instructions: Secure with tape as directed. Electronic Signature([name]) Signed: 03/25/2022 12:29:31 PM By: Kalman Shan DO Entered By: Kalman Shan on 03/25/2022 11:52:33 -------------------------------------------------------------------------------- Problem List Details Patient Name: Date of Service: [name]. 03/25/2022 9:30 A M Medical Record Number: [id number] Patient Account Number: [id number] Date of Birth/Sex: Treating RN: December 30, 1965 (57 y.o. Witold Henriques, Antionette Char ([id number]) [id number] Page 5 of 10 Primary Care Provider: [name] Other Clinician: Referring Provider: Treating Provider/Extender: [name] in Treatment: 6 Active Problems ICD-10 Encounter Code Description Active Date MDM Diagnosis L97.522 Non-pressure chronic ulcer of other part of left foot with  fat layer exposed 02/11/2022 No Yes G90.09 Other idiopathic peripheral autonomic neuropathy 02/11/2022 No Yes G47.33 Obstructive sleep apnea (adult) (pediatric) 02/11/2022 No Yes I87.2 Venous insufficiency (chronic) (peripheral) 02/11/2022 No Yes Inactive Problems Resolved Problems Electronic Signature([name]) Signed: 03/25/2022 12:29:31 PM By: Kalman Shan DO Entered By: Kalman Shan on 03/25/2022 11:43:50 -------------------------------------------------------------------------------- Progress Note Details Patient Name: Date of Service: [name] 03/25/2022 9:30 A M Medical Record Number: [id number] Patient Account Number: [id number] Date of Birth/Sex: Treating RN: 07/27/1965 (57 y.o. M) Primary Care Provider: [name] Other Clinician: Referring Provider: Treating Provider/Extender: [name] in Treatment: 6 Subjective Chief Complaint Information obtained from Patient 02/11/2022 left great toe wound and fourth sub-met head wound History of Present Illness (HPI) 02/11/2022 Mr. Eilert Ocheltree is a 57 year old male with a past medical history of current tobacco user, OSA, venous insufficiency and peripheral neuropathy that presents the clinic for a 2-week history of ulcer to the left great toe and 2-63-monthhistory of nonhealing ulcer to the left plantar foot. He works in a [place] is on his feet all day. He wears steel toed boots. He is currently out of work due to his wounds. On 11/26/2021 he was admitted to the hospital for cellulitis and abscess to the left foot. 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 He states he has been on doxycycline for 10 weeks. He just completed this course. He uses regular tennis shoes for walking. He is not offloading the area. He is using Betadine for wound dressings. He has been following with Dr. PPosey Pronto podiatry who Referred to our clinic. 1/23; patient presents for  follow-up. He has been using Medihoney and Hydrofera Blue to the wound beds Along with his surgical shoe and offloading peg assist. He has no issues or complaints today. He denies signs of infection. 1/30; patient presents for follow-up. He has been using Medihoney and Hydrofera Blue to the wound beds. He has no issues or complaints today. Plan is for the total contact cast and patient is agreeable to move forward with this. 2/1; patient presents for follow-up. We have been using antibiotic ointment with Hydrofera Blue to the wound beds under the total contact cast. He presents today for his obligatory cast change. He had no issues with the cast. 2/6; patient presents for follow-up. We have been using antibiotic ointment with Hydrofera Blue to the wound beds under the total contact cast. He has no issues or complaints today. He has tolerated the cast well. 2/13; patient presents for follow-up. We have been using the total contact cast to the left lower extremity. We have been using Hydrofera Blue and antibiotic ointment to the wound beds. He has no issues or complaints today. 2/20; patient presents for follow-up. We were using the total contact cast along with Hydrofera Blue and antibiotic ointment. He states the cast  was slightly [name], [name] ([id number]) [id number] Page 6 of 10 uncomfortable over the past week. 2/27; Patient presents for follow-up. We have been using Hydrofera Blue with antibiotic ointment under the total contact cast. He tolerated the cast well and has no issues or complaints today. Patient History Family History Heart Disease - Mother, Lung Disease - Mother. Social History Current every day smoker - 10 cig a day, Alcohol Use - Never, Drug Use - No History, Caffeine Use - Never. Medical History Hematologic/Lymphatic Patient has history of Lymphedema - pumps use daily Respiratory Patient has history of Sleep Apnea -  CPAP Cardiovascular Patient has history of Hypertension, Peripheral Venous Disease - needs ablation on left leg Musculoskeletal Patient has history of Osteoarthritis Denies history of Osteomyelitis Neurologic Patient has history of Neuropathy Hospitalization/Surgery History - 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. Medical A Surgical History Notes nd Musculoskeletal abscess to left foot 3rd toe 11/28/2022 Objective Constitutional respirations regular, non-labored and within target range for patient.. Vitals Time Taken: 9:47 AM, Height: 75 in, Weight: 220 lbs, BMI: 27.5, Temperature: 98.7 F, Pulse: 69 bpm, Respiratory Rate: 17 breaths/min, Blood Pressure: 147/79 mmHg. Cardiovascular 2+ dorsalis pedis/posterior tibialis pulses. Psychiatric pleasant and cooperative. General Notes: Left foot: T the plantar aspect of the great toe there is an open wound with granulation tissue, slough and surrounding callus. T the plantar o o aspect of the foot there is an open wound with granulation tissue,slough, pocket of increased depth in the center. No probing to bone. No signs of surrounding soft tissue infection to any of the wound beds. Integumentary (Hair, Skin) Wound #1 status is Open. Original cause of wound was Shear/Friction. The date acquired was: 01/28/2022. The wound has been in treatment 6 weeks. The wound is located on the Left T Great. The wound measures 0.2cm length x 0.3cm width x 0.2cm depth; 0.047cm^2 area and 0.009cm^3 volume. There is Fat Layer oe (Subcutaneous Tissue) exposed. There is no tunneling noted, however, there is undermining starting at 12:00 and ending at 12:00 with a maximum distance of 0.2cm. There is a medium amount of serosanguineous drainage noted. The wound margin is distinct with the outline attached to the wound base. There is large (67-100%) pink granulation within the wound bed. There is no necrotic tissue within the wound bed. The periwound skin  appearance exhibited: Callus. The periwound skin appearance did not exhibit: Crepitus, Excoriation, Induration, Rash, Scarring, Dry/Scaly, Maceration, Atrophie Blanche, Cyanosis, Ecchymosis, Hemosiderin Staining, Mottled, Pallor, Rubor, Erythema. Periwound temperature was noted as No Abnormality. Wound #2 status is Open. Original cause of wound was Surgical Injury. The date acquired was: 01/14/2022. The wound has been in treatment 6 weeks. The wound is located on the [place]. The wound measures 0.3cm length x 0.2cm width x 0.7cm depth; 0.047cm^2 area and 0.033cm^3 volume. There is Fat Layer (Subcutaneous Tissue) exposed. There is no tunneling noted, however, there is undermining starting at 12:00 and ending at 6:00 with a maximum distance of 0.4cm. There is a medium amount of serosanguineous drainage noted. The wound margin is distinct with the outline attached to the wound base. There is large (67-100%) red, pink granulation within the wound bed. There is no necrotic tissue within the wound bed. The periwound skin appearance exhibited: Callus. The periwound skin appearance did not exhibit: Crepitus, Excoriation, Induration, Rash, Scarring, Dry/Scaly, Maceration, Atrophie Blanche, Cyanosis, Ecchymosis, Hemosiderin Staining, Mottled, Pallor, Rubor, Erythema. 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 I debrided nonviable tissue. I recommended continuing the course with gentamicin and Hydrofera Blue. The total contact cast was placed in standard fashion. Follow-up in 1 week. 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 There was a Selective/Open Wound Non-Viable oe Tissue Debridement with a total area of 0.1 sq cm performed by Kalman Shan, DO. With the following instrument([name]): Curette to remove Viable and Non- Viable tissue/material. Material removed includes Callus and Slough and after achieving pain control using Lidocaine. No specimens were taken. A time out was conducted at 10:23, prior to the start of the procedure. A Minimum amount of bleeding was controlled with Pressure. The procedure was tolerated well with a pain level of 0 throughout and a pain level of 0 following the procedure. Post Debridement Measurements: 0.2cm length x 0.3cm width x 0.2cm depth; 0.009cm^3 volume. Character of Wound/Ulcer Post Debridement is improved. Post procedure Diagnosis Wound #1: Same as Pre-Procedure Pre-procedure diagnosis of Wound #1 is a Neuropathic Ulcer-Non Diabetic located on the Left T Great . There was a T Programmer, multimedia Procedure by Bjorn Pippin, Minor Iden, DO. Post procedure Diagnosis Wound #1: Same as Pre-Procedure Wound #2 Pre-procedure diagnosis of Wound #2 is a Neuropathic Ulcer-Non Diabetic located on the Left,Plantar Foot . There was a Excisional Skin/Subcutaneous Tissue Debridement with a total area of 0.06 sq cm performed by Kalman Shan, DO. With the following instrument([name]): Curette to remove Viable and Non-Viable tissue/material. Material removed includes Subcutaneous Tissue and Slough and after achieving pain control using Lidocaine. No specimens were taken. A time out was conducted at 10:23, prior to the start of the procedure. A Minimum amount of bleeding was controlled with Pressure. The procedure was tolerated well with a pain level of 0 throughout and a pain level of 0 following the procedure. Post Debridement Measurements: 0.3cm length x 0.2cm width x 0.7cm depth; 0.033cm^3 volume. Character of Wound/Ulcer Post Debridement is improved. Post procedure  Diagnosis Wound #2: Same as Pre-Procedure Pre-procedure diagnosis of Wound #2 is a Neuropathic Ulcer-Non Diabetic located on the Left,Plantar Foot . There was a T Programmer, multimedia Procedure by Cephas Darby, Raynold Blankenbaker, DO. Post procedure Diagnosis Wound #2: Same as Pre-Procedure Plan Follow-up Appointments: Return Appointment in 1 week. - Dr. Heber [place] and Allayne Butcher Rm # 9 Tuesday 04/01/22 @ 11:00 Return Appointment in 2 weeks. - Dr Heber [place] and Allayne Butcher Rm # 9 Tuesday 04/08/22 @ 9:30 Anesthetic: (In clinic) Topical Lidocaine 4% applied to wound bed Bathing/ Shower/ Hygiene: May shower with protection but do not get wound dressing([name]) wet. Protect dressing([name]) with water repellant cover (for example, large plastic bag) or a cast cover and may then take shower. Edema Control - Lymphedema / SCD / Other: Elevate legs to the level of the heart or above for 30 minutes daily and/or when sitting for 3-4 times a day throughout the day. Avoid standing for long periods of time. Off-Loading: T Contact Cast to Left Lower Extremity - Size 4 otal WOUND #1: - T Great Wound Laterality: Left oe Cleanser: Wound Cleanser 1 x Per Week/30 Days Discharge Instructions: Cleanse the wound with wound cleanser prior to applying a clean dressing using gauze sponges, not tissue or cotton balls. Cleanser: Byram Ancillary Kit - 15 Day Supply (Generic) 1 x Per Week/30 Days Discharge  Instructions: Use supplies as instructed; Kit contains: (15) Saline Bullets; (15) 3x3 Gauze; 15 pr Gloves Topical: Gentamicin 1 x Per Week/30 Days Discharge Instructions: As directed by physician Topical: Mupirocin Ointment 1 x Per Week/30 Days Discharge Instructions: Apply Mupirocin (Bactroban) as instructed Prim Dressing: Hydrofera Blue Ready Transfer Foam, 2.5x2.5 (in/in) (Generic) 1 x Per Week/30 Days ary Discharge Instructions: Apply directly to wound bed as directed Secondary Dressing: Optifoam Non-Adhesive Dressing, 4x4 in (Generic) 1 x Per  Week/30 Days Discharge Instructions: Apply x1 layer donut to aid in offloading. Secondary Dressing: Woven Gauze Sponges 2x2 in (Generic) 1 x Per Week/30 Days Discharge Instructions: Apply over primary dressing as directed. Secured With: Child psychotherapist, Sterile 2x75 (in/in) (Generic) 1 x Per Week/30 Days Discharge Instructions: Secure with stretch gauze as directed. Secured With: 7M Medipore H Soft Cloth Surgical T ape, 4 x 10 (in/yd) (Generic) 1 x Per Week/30 Days Discharge Instructions: Secure with tape as directed. WOUND #2: - Foot Wound Laterality: Plantar, Left Cleanser: Wound Cleanser 1 x Per Week/30 Days Discharge Instructions: Cleanse the wound with wound cleanser prior to applying a clean dressing using gauze sponges, not tissue or cotton balls. Cleanser: Byram Ancillary Kit - 15 Day Supply (Generic) 1 x Per Week/30 Days Discharge Instructions: Use supplies as instructed; Kit contains: (15) Saline Bullets; (15) 3x3 Gauze; 15 pr Gloves Topical: Gentamicin 1 x Per Week/30 Days Discharge Instructions: As directed by physician [name], [name] ([id number]) [id number] Page 8 of 10 Topical: Mupirocin Ointment 1 x Per Week/30 Days Discharge Instructions: Apply Mupirocin (Bactroban) as instructed Prim Dressing: Hydrofera Blue Ready Transfer Foam, 2.5x2.5 (in/in) (Generic) 1 x Per Week/30 Days ary Discharge Instructions: Apply directly to wound bed as directed Secondary Dressing: Optifoam Non-Adhesive Dressing, 4x4 in (Generic) 1 x Per Week/30 Days Discharge Instructions: Apply x1 layer donut to aid in offloading. Secondary Dressing: Woven Gauze Sponges 2x2 in (Generic) 1 x Per Week/30 Days Discharge Instructions: Apply over primary dressing as directed. Secured With: Child psychotherapist, Sterile 2x75 (in/in) (Generic) 1 x Per Week/30 Days Discharge Instructions: Secure with stretch gauze as directed. Secured With: 7M Medipore H  Soft Cloth Surgical T ape, 4 x 10 (in/yd) (Generic) 1 x Per Week/30 Days Discharge Instructions: Secure with tape as directed. 1. In office sharp debridement 2. Hydrofera Blue and antibiotic ointment 3. T contact cast placed in standard fashion otal 4. Follow-up in 1 week Electronic Signature([name]) Signed: 03/25/2022 12:29:31 PM By: Kalman Shan DO Entered By: Kalman Shan on 03/25/2022 11:53:19 -------------------------------------------------------------------------------- HxROS Details Patient Name: Date of Service: [name]. 03/25/2022 9:30 A M Medical Record Number: [id number] Patient Account Number: [id number] Date of Birth/Sex: Treating RN: 05/31/1965 (57 y.o. M) Primary Care Provider: [name] Other Clinician: Referring Provider: Treating Provider/Extender: [name] in Treatment: 6 Hematologic/Lymphatic Medical History: Positive for: Lymphedema - pumps use daily Respiratory Medical History: Positive for: Sleep Apnea - CPAP Cardiovascular Medical History: Positive for: Hypertension; Peripheral Venous Disease - needs ablation on left leg Musculoskeletal Medical History: Positive for: Osteoarthritis Negative for: Osteomyelitis Past Medical History Notes: abscess to left foot 3rd toe 11/28/2022 Neurologic Medical History: Positive for: Neuropathy Immunizations Implantable Devices No devices added Hospitalization / Surgery History Type of Hospitalization/Surgery 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 Family and Social History Heart Disease: Yes - Mother; Lung Disease: Yes - Mother; Current every day smoker - 10 cig a day; Alcohol Use: Never; Drug Use: No History; Caffeine Use: Never; Financial Concerns: No; Food,  Clothing or Shelter Needs: No; Support System Lacking: No; Transportation Concerns: No [name], [name] ([id number]) [id number] Page 9 of 10 Electronic  Signature([name]) Signed: 03/25/2022 12:29:31 PM By: Kalman Shan DO Entered By: Kalman Shan on 03/25/2022 11:51:49 -------------------------------------------------------------------------------- Total Contact Cast Details Patient Name: Date of Service: [name], [name] 03/25/2022 9:30 A M Medical Record Number: [id number] Patient Account Number: [id number] Date of Birth/Sex: Treating RN: [birth year] (57 y.o. [name], [name] Primary Care Provider: [name] Other Clinician: Referring Provider: Treating Provider/Extender: [name] in Treatment: 6 T Contact Cast Applied for Wound Assessment: otal Wound #1 Left T Great oe Performed By: Physician Kalman Shan, DO Post Procedure Diagnosis Same as Pre-procedure Electronic Signature([name]) Signed: 03/25/2022 12:29:31 PM By: Kalman Shan DO Signed: 03/25/2022 3:29:19 PM By: Rhae Hammock RN Entered By: Rhae Hammock on 03/25/2022 10:11:00 -------------------------------------------------------------------------------- Total Contact Cast Details Patient Name: Date of Service: [name], [name] 03/25/2022 9:30 A M Medical Record Number: [id number] Patient Account Number: [id number] Date of Birth/Sex: Treating RN: [birth year] (57 y.o. [name], [name] Primary Care Provider: [name] Other Clinician: Referring Provider: Treating Provider/Extender: [name] in Treatment: 6 T Contact Cast Applied for Wound Assessment: otal Wound #2 Left,Plantar Foot Performed By: Physician Kalman Shan, DO Post Procedure Diagnosis Same as Pre-procedure Electronic Signature([name]) Signed: 03/25/2022 12:29:31 PM By: Kalman Shan DO Signed: 03/25/2022 3:29:19 PM By: Rhae Hammock RN Entered By: Rhae Hammock on 03/25/2022 10:11:01 -------------------------------------------------------------------------------- SuperBill Details Patient  Name: Date of Service: [name] 03/25/2022 Medical Record Number: [id number] Patient Account Number: [id number] Date of Birth/Sex: Treating RN: 02/18/1965 (57 y.o. [name], [name] Primary Care Provider: [name] Other Clinician: Referring Provider: Treating Provider/Extender: [name] in Treatment: 6 Diagnosis Coding ICD-10 Codes Code Description [phone] 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 G47.33 Obstructive sleep apnea (adult) (pediatric) I87.2 Venous insufficiency (chronic) (peripheral) Moger, Jordany [name] ([id number]) [id number] Page 10 of 10 Facility Procedures : CPT4 Code: [id number] Description: [id number] - DEB SUBQ TISSUE 20 SQ CM/< ICD-10 Diagnosis Description L97.522 Non-pressure chronic ulcer of other part of 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 Modifier: Quantity: 1 : CPT4 Code: [id number] Description: [id number] - DEBRIDE WOUND 1ST 20 SQ CM OR < ICD-10 Diagnosis Description L97.522 Non-pressure chronic ulcer of other part of 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 Modifier: Quantity: 1 Physician Procedures : CPT4 Code Description Modifier [id number] - WC PHYS SUBQ TISS 20 SQ CM ICD-10 Diagnosis Description L97.522 Non-pressure chronic ulcer of other part of 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 Quantity: 1 : [id number] - WC PHYS DEBR WO ANESTH 20 SQ CM ICD-10 Diagnosis Description L97.522 Non-pressure chronic ulcer of other part of 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 Quantity: 1 Electronic Signature([name]) Signed: 03/25/2022 12:29:31 PM By: Kalman Shan DO Entered By: Kalman Shan on 03/25/2022 11:53:38

## 2022-03-31 DIAGNOSIS — N182 Chronic kidney disease, stage 2 (mild): Secondary | ICD-10-CM | POA: Diagnosis not present

## 2022-03-31 DIAGNOSIS — Z0000 Encounter for general adult medical examination without abnormal findings: Secondary | ICD-10-CM | POA: Diagnosis not present

## 2022-03-31 DIAGNOSIS — R600 Localized edema: Secondary | ICD-10-CM | POA: Diagnosis not present

## 2022-03-31 DIAGNOSIS — E782 Mixed hyperlipidemia: Secondary | ICD-10-CM | POA: Diagnosis not present

## 2022-04-01 ENCOUNTER — Encounter (HOSPITAL_BASED_OUTPATIENT_CLINIC_OR_DEPARTMENT_OTHER): Payer: BC Managed Care – PPO | Attending: Internal Medicine | Admitting: Internal Medicine

## 2022-04-01 DIAGNOSIS — I890 Lymphedema, not elsewhere classified: Secondary | ICD-10-CM | POA: Insufficient documentation

## 2022-04-01 DIAGNOSIS — F17210 Nicotine dependence, cigarettes, uncomplicated: Secondary | ICD-10-CM | POA: Diagnosis not present

## 2022-04-01 DIAGNOSIS — M1990 Unspecified osteoarthritis, unspecified site: Secondary | ICD-10-CM | POA: Insufficient documentation

## 2022-04-01 DIAGNOSIS — G9009 Other idiopathic peripheral autonomic neuropathy: Secondary | ICD-10-CM | POA: Insufficient documentation

## 2022-04-01 DIAGNOSIS — L97522 Non-pressure chronic ulcer of other part of 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: Secondary | ICD-10-CM | POA: Diagnosis not present

## 2022-04-01 DIAGNOSIS — G4733 Obstructive sleep apnea (adult) (pediatric): Secondary | ICD-10-CM | POA: Diagnosis not present

## 2022-04-01 DIAGNOSIS — I10 Essential (primary) hypertension: Secondary | ICD-10-CM | POA: Insufficient documentation

## 2022-04-01 DIAGNOSIS — I872 Venous insufficiency (chronic) (peripheral): Secondary | ICD-10-CM | POA: Diagnosis not present

## 2022-04-01 DIAGNOSIS — L02612 Cutaneous abscess of left foot: Secondary | ICD-10-CM | POA: Insufficient documentation

## 2022-04-05 NOTE — Progress Notes (Signed)
ABHIJIT, NOLTING [name] ([id number]) [id number] Page 1 of 10 Visit Report for 04/01/2022 Chief Complaint Document Details Patient Name: Date of Service: [name], [name] 04/01/2022 11:00 A M Medical Record Number: [id number] Patient Account Number: [id number] Date of Birth/Sex: Treating RN: 1965-05-29 (57 y.o. M) Primary Care Provider: [name] Other Clinician: Referring Provider: Treating Provider/Extender: [name] in Treatment: 7 Information Obtained from: Patient Chief Complaint 02/11/2022 left great toe wound and fourth sub-met head wound Electronic Signature([name]) Signed: 04/01/2022 12:10:40 PM By: Kalman Shan DO Entered By: Kalman Shan on 04/01/2022 12:05:10 -------------------------------------------------------------------------------- Debridement Details Patient Name: Date of Service: John Kelp. 04/01/2022 11:00 A M Medical Record Number: [id number] Patient Account Number: [id number] Date of Birth/Sex: Treating RN: May 15, 1965 (57 y.o. [name], [name] Primary Care Provider: [name] Other Clinician: Referring Provider: Treating Provider/Extender: [name] in Treatment: 7 Debridement Performed for Assessment: Wound #1 Left T Great oe Performed By: Physician Kalman Shan, DO Debridement Type: Debridement Level of Consciousness (Pre-procedure): Awake and Alert Pre-procedure Verification/Time Out Yes - 11:40 Taken: Start Time: 11:40 Pain Control: Lidocaine T Area Debrided (L x W): otal 0.2 (cm) x 0.2 (cm) = 0.04 (cm) Tissue and other material debrided: Viable, Non-Viable, Callus, Slough, Subcutaneous, Slough Level: Skin/Subcutaneous Tissue Debridement Description: Excisional Instrument: Curette Bleeding: Minimum Hemostasis Achieved: Pressure End Time: 11:40 Procedural Pain: 0 Post Procedural Pain: 0 Response to Treatment: Procedure was  tolerated well Level of Consciousness (Post- Awake and Alert procedure): Post Debridement Measurements of Total Wound Length: (cm) 0.2 Width: (cm) 0.2 Depth: (cm) 0.2 Volume: (cm) 0.006 Character of Wound/Ulcer Post Debridement: Improved Post Procedure Diagnosis Same as Pre-procedure Electronic Signature([name]) Signed: 04/01/2022 12:10:40 PM By: Kalman Shan DO Signed: 04/03/2022 4:33:02 PM By: Rhae Hammock RN Entered By: Rhae Hammock on 04/01/2022 11:40:59 [name], [name] ([id number]) [id number] Page 2 of 10 -------------------------------------------------------------------------------- Debridement Details Patient Name: Date of Service: [name], [name] 04/01/2022 11:00 A M Medical Record Number: [id number] Patient Account Number: [id number] Date of Birth/Sex: Treating RN: August 22, 1965 (57 y.o. [name], [name] Primary Care Provider: [name] Other Clinician: Referring Provider: Treating Provider/Extender: [name] in Treatment: 7 Debridement Performed for Assessment: Wound #2 Left,Plantar Foot Performed By: Physician Kalman Shan, DO Debridement Type: Debridement Level of Consciousness (Pre-procedure): Awake and Alert Pre-procedure Verification/Time Out Yes - 11:40 Taken: Start Time: 11:40 Pain Control: Lidocaine T Area Debrided (L x W): otal 0.9 (cm) x 0.5 (cm) = 0.45 (cm) Tissue and other material debrided: Viable, Non-Viable, Callus, Slough, Subcutaneous, Slough Level: Skin/Subcutaneous Tissue Debridement Description: Excisional Instrument: Curette Bleeding: Minimum Hemostasis Achieved: Pressure End Time: 11:40 Procedural Pain: 0 Post Procedural Pain: 0 Response to Treatment: Procedure was tolerated well Level of Consciousness (Post- Awake and Alert procedure): Post Debridement Measurements of Total Wound Length: (cm) 0.9 Width: (cm) 0.5 Depth: (cm) 0.5 Volume: (cm)  0.177 Character of Wound/Ulcer Post Debridement: Improved Post Procedure Diagnosis Same as Pre-procedure Electronic Signature([name]) Signed: 04/01/2022 12:10:40 PM By: Kalman Shan DO Signed: 04/03/2022 4:33:02 PM By: Rhae Hammock RN Entered By: Rhae Hammock on 04/01/2022 11:41:22 -------------------------------------------------------------------------------- HPI Details Patient Name: Date of Service: John Kelp. 04/01/2022 11:00 A M Medical Record Number: [id number] Patient Account Number: [id number] Date of Birth/Sex: Treating RN: 31-Aug-1965 (57 y.o. M) Primary Care Provider: [name] Other Clinician: Referring Provider: Treating Provider/Extender: [name] in Treatment: 7 History of Present Illness HPI Description: 02/11/2022 Mr. [name] is a 57 year old male with a past medical history of  current tobacco user, OSA, venous insufficiency and peripheral neuropathy that presents the clinic for a 2-week history of ulcer to the left great toe and 2-67-monthhistory of nonhealing ulcer to the left plantar foot. [place] day. He wears steel toed boots. He is currently out of work due to his wounds. On 11/26/2021 he was admitted to the hospital for cellulitis and abscess to the left foot. 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 He states he has been on doxycycline for 10 weeks. He just completed this course. He uses regular tennis shoes for walking. He is not offloading the area. He is using Betadine for wound dressings. He has been following with Dr. PPosey Pronto podiatry who Referred to our clinic. 1/23; patient presents for follow-up. He has been using Medihoney and Hydrofera Blue to the wound beds Along with his surgical shoe and offloading peg assist. He has no issues or complaints today. He denies signs of infection. 1/30; patient presents for follow-up. He has  been using Medihoney and Hydrofera Blue to the wound beds. He has no issues or complaints today. Plan is for the total contact cast and patient is agreeable to move forward with this. 2/1; patient presents for follow-up. We have been using antibiotic ointment with Hydrofera Blue to the wound beds under the total contact cast. He presents [name], [name]([id number] Page 3 of 10 today for his obligatory cast change. He had no issues with the cast. 2/6; patient presents for follow-up. We have been using antibiotic ointment with Hydrofera Blue to the wound beds under the total contact cast. He has no issues or complaints today. He has tolerated the cast well. 2/13; patient presents for follow-up. We have been using the total contact cast to the left lower extremity. We have been using Hydrofera Blue and antibiotic ointment to the wound beds. He has no issues or complaints today. 2/20; patient presents for follow-up. We were using the total contact cast along with Hydrofera Blue and antibiotic ointment. He states the cast was slightly uncomfortable over the past week. 2/27; Patient presents for follow-up. We have been using Hydrofera Blue with antibiotic ointment under the total contact cast. He tolerated the cast well and has no issues or complaints today. 3/5; Patient presents for follow-up. We have been using Hydrofera Blue with antibiotic ointment under the total contact cast. He tolerated the cast well and has no issues or complaints today. Electronic Signature([name]) Signed: 04/01/2022 12:10:40 PM By: HKalman ShanDO Entered By: HKalman Shanon 04/01/2022 12:05:36 -------------------------------------------------------------------------------- Physical Exam Details Patient Name: Date of Service: [name], PADALINO3/05/2022 11:00 A M Medical Record Number: [id number] Account Number: [id number] of Birth/Sex: Treating RN: 611-Oct-1967(57 y.o. M) Primary Care Provider: RMerrilee SeashoreOther Clinician: Referring Provider: Treating Provider/Extender: HWandra Scotin Treatment: 7 Constitutional respirations regular, non-labored and within target range for patient.. Cardiovascular 2+ dorsalis pedis/posterior tibialis pulses. Psychiatric pleasant and cooperative. Notes Left foot: T the plantar aspect of the great toe there is an open wound with granulation tissue, non viable tissue and surrounding callus. T the plantar aspect o o of the foot there is an open wound with granulation tissue, slough, pocket of increased depth in the center. No probing to bone. No signs of surrounding soft tissue infection to any of the wound beds. Electronic Signature([name]) Signed: 04/01/2022 12:10:40 PM By: HKalman ShanDO Entered By: HKalman Shan  on 04/01/2022 12:08:10 -------------------------------------------------------------------------------- Physician Orders Details Patient Name: Date of Service: [name], [name] 04/01/2022 11:00 A M Medical Record Number: [id number] Patient Account Number: [id number] Date of Birth/Sex: Treating RN: 1965-12-26 (57 y.o. [place] Noe Primary Care Provider: [name] Other Clinician: Referring Provider: Treating Provider/Extender: [name] in Treatment: 7 Verbal / Phone Orders: No Diagnosis Coding Follow-up Appointments ppointment in 1 week. - Dr. Heber [place] and Allayne Butcher Rm # 9 Tuesday 04/08/22 @ 9:30 Return A ppointment in 2 weeks. - Dr Heber [place] and Allayne Butcher Rm # 9 Tuesday 04/15/22 @ 9:30 Return A Anesthetic (In clinic) Topical Lidocaine 4% applied to wound bed Bathing/ Shower/ Hygiene May shower with protection but do not get wound dressing([name]) wet. Protect dressing([name]) with water repellant cover (for example, large plastic bag) or a cast cover and may then take shower. STACE, CLOHESSY [name] ([id number])  [id number] Page 4 of 10 Edema Control - Lymphedema / SCD / Other Elevate legs to the level of the heart or above for 30 minutes daily and/or when sitting for 3-4 times a day throughout the day. Avoid standing for long periods of time. Off-Loading Total Contact Cast to Left Lower Extremity - Size 4 Wound Treatment Wound #1 - T Great oe Wound Laterality: Left Cleanser: Wound Cleanser 1 x Per Week/30 Days Discharge Instructions: Cleanse the wound with wound cleanser prior to applying a clean dressing using gauze sponges, not tissue or cotton balls. Cleanser: Byram Ancillary Kit - 15 Day Supply (Generic) 1 x Per Week/30 Days Discharge Instructions: Use supplies as instructed; Kit contains: (15) Saline Bullets; (15) 3x3 Gauze; 15 pr Gloves Topical: Gentamicin 1 x Per Week/30 Days Discharge Instructions: As directed by physician Topical: Mupirocin Ointment 1 x Per Week/30 Days Discharge Instructions: Apply Mupirocin (Bactroban) as instructed Prim Dressing: Hydrofera Blue Ready Transfer Foam, 2.5x2.5 (in/in) (Generic) 1 x Per Week/30 Days ary Discharge Instructions: Apply directly to wound bed as directed Secondary Dressing: Optifoam Non-Adhesive Dressing, 4x4 in (Generic) 1 x Per Week/30 Days Discharge Instructions: Apply x1 layer donut to aid in offloading. Secondary Dressing: Woven Gauze Sponges 2x2 in (Generic) 1 x Per Week/30 Days Discharge Instructions: Apply over primary dressing as directed. Secured With: Child psychotherapist, Sterile 2x75 (in/in) (Generic) 1 x Per Week/30 Days Discharge Instructions: Secure with stretch gauze as directed. Secured With: 2M Medipore H Soft Cloth Surgical T ape, 4 x 10 (in/yd) (Generic) 1 x Per Week/30 Days Discharge Instructions: Secure with tape as directed. Wound #2 - Foot Wound Laterality: Plantar, Left Cleanser: Wound Cleanser 1 x Per Week/30 Days Discharge Instructions: Cleanse the wound with wound cleanser  prior to applying a clean dressing using gauze sponges, not tissue or cotton balls. Cleanser: Byram Ancillary Kit - 15 Day Supply (Generic) 1 x Per Week/30 Days Discharge Instructions: Use supplies as instructed; Kit contains: (15) Saline Bullets; (15) 3x3 Gauze; 15 pr Gloves Topical: Gentamicin 1 x Per Week/30 Days Discharge Instructions: As directed by physician Topical: Mupirocin Ointment 1 x Per Week/30 Days Discharge Instructions: Apply Mupirocin (Bactroban) as instructed Prim Dressing: Promogran Prisma Matrix, 4.34 (sq in) (silver collagen) 1 x Per Week/30 Days ary Discharge Instructions: Moisten collagen with saline or hydrogel Prim Dressing: Hydrofera Blue Ready Transfer Foam, 2.5x2.5 (in/in) (Generic) 1 x Per Week/30 Days ary Discharge Instructions: Apply directly to wound bed as directed Secondary Dressing: Optifoam Non-Adhesive Dressing, 4x4 in (Generic) 1 x Per Week/30 Days Discharge Instructions: Apply x1 layer donut to aid in offloading. Secondary Dressing: Woven Gauze  Sponges 2x2 in (Generic) 1 x Per Week/30 Days Discharge Instructions: Apply over primary dressing as directed. Secured With: Child psychotherapist, Sterile 2x75 (in/in) (Generic) 1 x Per Week/30 Days Discharge Instructions: Secure with stretch gauze as directed. Secured With: 82M Medipore H Soft Cloth Surgical T ape, 4 x 10 (in/yd) (Generic) 1 x Per Week/30 Days Discharge Instructions: Secure with tape as directed. Electronic Signature([name]) Signed: 04/01/2022 12:10:40 PM By: Kalman Shan DO Entered By: Kalman Shan on 04/01/2022 12:08:18 John Kelp ([id number]) [id number] Page 5 of 10 -------------------------------------------------------------------------------- Problem List Details Patient Name: Date of Service: [name], [name] 04/01/2022 11:00 A M Medical Record Number: [id number] Patient Account Number: [id number] Date of Birth/Sex: Treating  RN: 06-12-1965 (57 y.o. M) Primary Care Provider: [name] Other Clinician: Referring Provider: Treating Provider/Extender: [name] in Treatment: 7 Active Problems ICD-10 Encounter Code Description Active Date MDM Diagnosis L97.522 Non-pressure chronic ulcer of other part of 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 02/11/2022 No Yes G90.09 Other idiopathic peripheral autonomic neuropathy 02/11/2022 No Yes G47.33 Obstructive sleep apnea (adult) (pediatric) 02/11/2022 No Yes I87.2 Venous insufficiency (chronic) (peripheral) 02/11/2022 No Yes Inactive Problems Resolved Problems Electronic Signature([name]) Signed: 04/01/2022 12:10:40 PM By: Kalman Shan DO Entered By: Kalman Shan on 04/01/2022 12:04:54 -------------------------------------------------------------------------------- Progress Note Details Patient Name: Date of Service: John Kelp. 04/01/2022 11:00 A M Medical Record Number: [id number] Patient Account Number: [id number] Date of Birth/Sex: Treating RN: 09-13-1965 (57 y.o. M) Primary Care Provider: [name] Other Clinician: Referring Provider: Treating Provider/Extender: [name] in Treatment: 7 Subjective Chief Complaint Information obtained from Patient 02/11/2022 left great toe wound and fourth sub-met head wound History of Present Illness (HPI) 02/11/2022 Mr. Norvel Shala is a 57 year old male with a past medical history of current tobacco user, OSA, venous insufficiency and peripheral neuropathy that presents the clinic for a 2-week history of ulcer to the left great toe and 2-39-monthhistory of nonhealing ulcer to the left plantar foot. [place] a [place] all day. He wears steel toed boots. He is currently out of work due to his wounds. On 11/26/2021 he was admitted to the hospital for cellulitis and abscess to the left foot. He required amputation  of the third ray secondary to osteomyelitis. He states he has been on doxycycline for 10 weeks. He just completed this course. He uses regular tennis shoes for walking. He is not offloading the area. He is using Betadine for wound dressings. He has been following with Dr. PPosey Pronto podiatry who Referred to our clinic. 1/23; patient presents for follow-up. He has been using Medihoney and Hydrofera Blue to the wound beds Along with his surgical shoe and offloading peg assist. He has no issues or complaints today. He denies signs of infection. 1/30; patient presents for follow-up. He has been using Medihoney and Hydrofera Blue to the wound beds. He has no issues or complaints today. Plan is for the total contact cast and patient is agreeable to move forward with this. 2/1; patient presents for follow-up. We have been using antibiotic ointment with Hydrofera Blue to the wound beds under the total contact cast. He presents today for his obligatory cast change. He had no issues with the cast. 2/6; patient presents for follow-up. We have been using antibiotic ointment with Hydrofera Blue to the wound beds under the total contact cast. He has no [name] ([id number] Page 6 of 10 issues or complaints  today. He has tolerated the cast well. 2/13; patient presents for follow-up. We have been using the total contact cast to the left lower extremity. We have been using Hydrofera Blue and antibiotic ointment to the wound beds. He has no issues or complaints today. 2/20; patient presents for follow-up. We were using the total contact cast along with Hydrofera Blue and antibiotic ointment. He states the cast was slightly uncomfortable over the past week. 2/27; Patient presents for follow-up. We have been using Hydrofera Blue with antibiotic ointment under the total contact cast. He tolerated the cast well and has no issues or complaints today. 3/5; Patient presents for  follow-up. We have been using Hydrofera Blue with antibiotic ointment under the total contact cast. He tolerated the cast well and has no issues or complaints today. Patient History Family History Heart Disease - Mother, Lung Disease - Mother. Social History Current every day smoker - 10 cig a day, Alcohol Use - Never, Drug Use - No History, Caffeine Use - Never. Medical History Hematologic/Lymphatic Patient has history of Lymphedema - pumps use daily Respiratory Patient has history of Sleep Apnea - CPAP Cardiovascular Patient has history of Hypertension, Peripheral Venous Disease - needs ablation on left leg Musculoskeletal Patient has history of Osteoarthritis Denies history of Osteomyelitis Neurologic Patient has history of Neuropathy Hospitalization/Surgery History - 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. Medical A Surgical History Notes nd Musculoskeletal abscess to left foot 3rd toe 11/28/2022 Objective Constitutional respirations regular, non-labored and within target range for patient.. Vitals Time Taken: 11:07 AM, Height: 75 in, Weight: 220 lbs, BMI: 27.5, Temperature: 98.6 F, Pulse: 65 bpm, Respiratory Rate: 17 breaths/min, Blood Pressure: 126/75 mmHg. Cardiovascular 2+ dorsalis pedis/posterior tibialis pulses. Psychiatric pleasant and cooperative. General Notes: Left foot: T the plantar aspect of the great toe there is an open wound with granulation tissue, non viable tissue and surrounding callus. T the o o plantar aspect of the foot there is an open wound with granulation tissue, slough, pocket of increased depth in the center. No probing to bone. No signs of surrounding soft tissue infection to any of the wound beds. Integumentary (Hair, Skin) Wound #1 status is Open. Original cause of wound was Shear/Friction. The date acquired was: 01/28/2022. The wound has been in treatment 7 weeks. The wound is located on the Left T Great. The wound measures 0.2cm  length x 0.2cm width x 0.2cm depth; 0.031cm^2 area and 0.006cm^3 volume. There is Fat Layer oe (Subcutaneous Tissue) exposed. There is undermining starting at 12:00 and ending at 12:00 with a maximum distance of 0.2cm. There is a medium amount of serosanguineous drainage noted. The wound margin is distinct with the outline attached to the wound base. There is large (67-100%) pink granulation within the wound bed. There is no necrotic tissue within the wound bed. The periwound skin appearance exhibited: Callus. The periwound skin appearance did not exhibit: Crepitus, Excoriation, Induration, Rash, Scarring, Dry/Scaly, Maceration, Atrophie Blanche, Cyanosis, Ecchymosis, Hemosiderin Staining, Mottled, Pallor, Rubor, Erythema. Periwound temperature was noted as No Abnormality. Wound #2 status is Open. Original cause of wound was Surgical Injury. The date acquired was: 01/14/2022. The wound has been in treatment 7 weeks. The wound is located on the [place]. The wound measures 0.9cm length x 0.5cm width x 0.5cm depth; 0.353cm^2 area and 0.177cm^3 volume. There is Fat Layer (Subcutaneous Tissue) exposed. There is no tunneling noted, however, there is undermining starting at 12:00 and ending at 12:00 with a maximum distance of 0.3cm. There  is a medium amount of serosanguineous drainage noted. The wound margin is distinct with the outline attached to the wound base. There is large (67-100%) red, pink granulation within the wound bed. There is no necrotic tissue within the wound bed. The periwound skin appearance exhibited: Callus. The periwound skin appearance did not exhibit: Crepitus, Excoriation, Induration, Rash, Scarring, Dry/Scaly, Maceration, Atrophie Blanche, Cyanosis, Ecchymosis, Hemosiderin Staining, Mottled, Pallor, Rubor, Erythema. 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 I debrided nonviable tissue. I recommended continuing the course with Hydrofera Blue and antibiotic ointment. Will add collagen to the plantar foot wound as well. Continue total contact cast. 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 There was a Excisional Skin/Subcutaneous Tissue oe Debridement with a total area of 0.04 sq cm performed by Kalman Shan, DO. With the following instrument([name]): Curette to remove Viable and Non-Viable tissue/material. Material removed includes Callus, Subcutaneous Tissue, and Slough after achieving pain control using Lidocaine. No specimens were taken. A time out was conducted at 11:40, prior to the start of the procedure. A Minimum amount of bleeding was controlled with Pressure. The procedure was tolerated well with a pain level of 0 throughout and a pain level of 0 following the procedure. Post Debridement Measurements: 0.2cm length x 0.2cm width x 0.2cm depth; 0.006cm^3 volume. Character of Wound/Ulcer Post Debridement is improved. Post procedure Diagnosis Wound #1: Same as Pre-Procedure Pre-procedure diagnosis of Wound #1 is a Neuropathic Ulcer-Non Diabetic located on the Left T Great . There was a T Programmer, multimedia Procedure by Bjorn Pippin, Kayshawn Ozburn, DO. Post procedure Diagnosis Wound #1: Same as Pre-Procedure Wound #2 Pre-procedure diagnosis of Wound #2 is a Neuropathic Ulcer-Non Diabetic located on the Left,Plantar Foot . There was a Excisional Skin/Subcutaneous Tissue Debridement with a total area of 0.45 sq cm performed by Kalman Shan, DO. With the following instrument([name]): Curette to remove Viable and Non-Viable tissue/material. Material removed includes  Callus, Subcutaneous Tissue, and Slough after achieving pain control using Lidocaine. No specimens were taken. A time out was conducted at 11:40, prior to the start of the procedure. A Minimum amount of bleeding was controlled with Pressure. The procedure was tolerated well with a pain level of 0 throughout and a pain level of 0 following the procedure. Post Debridement Measurements: 0.9cm length x 0.5cm width x 0.5cm depth; 0.177cm^3 volume. Character of Wound/Ulcer Post Debridement is improved. Post procedure Diagnosis Wound #2: Same as Pre-Procedure Pre-procedure diagnosis of Wound #2 is a Neuropathic Ulcer-Non Diabetic located on the Left,Plantar Foot . There was a T Programmer, multimedia Procedure by Cephas Darby, Abdallah Hern, DO. Post procedure Diagnosis Wound #2: Same as Pre-Procedure Plan Follow-up Appointments: Return Appointment in 1 week. - Dr. Heber [place] and Allayne Butcher Rm # 9 Tuesday 04/08/22 @ 9:30 Return Appointment in 2 weeks. - Dr Heber  and Allayne Butcher Rm # 9 Tuesday 04/15/22 @ 9:30 Anesthetic: (In clinic) Topical Lidocaine 4% applied to wound bed Bathing/ Shower/ Hygiene: May shower with protection but do not get wound dressing([name]) wet. Protect dressing([name]) with water repellant cover (for example, large plastic bag) or a cast cover and may then take shower. Edema Control - Lymphedema / SCD / Other: Elevate legs to the level of the heart or above  for 30 minutes daily and/or when sitting for 3-4 times a day throughout the day. Avoid standing for long periods of time. Off-Loading: T Contact Cast to Left Lower Extremity - Size 4 otal WOUND #1: - T Great Wound Laterality: Left oe Cleanser: Wound Cleanser 1 x Per Week/30 Days Discharge Instructions: Cleanse the wound with wound cleanser prior to applying a clean dressing using gauze sponges, not tissue or cotton balls. Cleanser: [place]ram Ancillary Kit - 15 Day Supply (Generic) 1 x Per Week/30 Days Discharge Instructions: Use supplies as instructed; Kit  contains: (15) Saline Bullets; (15) 3x3 Gauze; 15 pr Gloves Topical: Gentamicin 1 x Per Week/30 Days Discharge Instructions: As directed by physician Topical: Mupirocin Ointment 1 x Per Week/30 Days Discharge Instructions: Apply Mupirocin (Bactroban) as instructed Prim Dressing: Hydrofera Blue Ready Transfer Foam, 2.5x2.5 (in/in) (Generic) 1 x Per Week/30 Days ary Discharge Instructions: Apply directly to wound bed as directed Secondary Dressing: Optifoam Non-Adhesive Dressing, 4x4 in (Generic) 1 x Per Week/30 Days Discharge Instructions: Apply x1 layer donut to aid in offloading. Secondary Dressing: Woven Gauze Sponges 2x2 in (Generic) 1 x Per Week/30 Days Discharge Instructions: Apply over primary dressing as directed. Secured With: Child psychotherapist, Sterile 2x75 (in/in) (Generic) 1 x Per Week/30 Days Discharge Instructions: Secure with stretch gauze as directed. [name], [name] ([id number]) [id number] Page 8 of 10 Secured With: 65M Medipore H Soft Cloth Surgical T ape, 4 x 10 (in/yd) (Generic) 1 x Per Week/30 Days Discharge Instructions: Secure with tape as directed. WOUND #2: - Foot Wound Laterality: Plantar, Left Cleanser: Wound Cleanser 1 x Per Week/30 Days Discharge Instructions: Cleanse the wound with wound cleanser prior to applying a clean dressing using gauze sponges, not tissue or cotton balls. Cleanser: Byram Ancillary Kit - 15 Day Supply (Generic) 1 x Per Week/30 Days Discharge Instructions: Use supplies as instructed; Kit contains: (15) Saline Bullets; (15) 3x3 Gauze; 15 pr Gloves Topical: Gentamicin 1 x Per Week/30 Days Discharge Instructions: As directed by physician Topical: Mupirocin Ointment 1 x Per Week/30 Days Discharge Instructions: Apply Mupirocin (Bactroban) as instructed Prim Dressing: Promogran Prisma Matrix, 4.34 (sq in) (silver collagen) 1 x Per Week/30 Days ary Discharge Instructions: Moisten collagen with saline  or hydrogel Prim Dressing: Hydrofera Blue Ready Transfer Foam, 2.5x2.5 (in/in) (Generic) 1 x Per Week/30 Days ary Discharge Instructions: Apply directly to wound bed as directed Secondary Dressing: Optifoam Non-Adhesive Dressing, 4x4 in (Generic) 1 x Per Week/30 Days Discharge Instructions: Apply x1 layer donut to aid in offloading. Secondary Dressing: Woven Gauze Sponges 2x2 in (Generic) 1 x Per Week/30 Days Discharge Instructions: Apply over primary dressing as directed. Secured With: Child psychotherapist, Sterile 2x75 (in/in) (Generic) 1 x Per Week/30 Days Discharge Instructions: Secure with stretch gauze as directed. Secured With: 65M Medipore H Soft Cloth Surgical T ape, 4 x 10 (in/yd) (Generic) 1 x Per Week/30 Days Discharge Instructions: Secure with tape as directed. 1. In office sharp debridement 2. Collagen 3. Hydrofera Blue 4. T contact cast placed in standard fashion otal Electronic Signature([name]) Signed: 04/01/2022 12:10:40 PM By: Kalman Shan DO Entered By: Kalman Shan on 04/01/2022 12:10:04 -------------------------------------------------------------------------------- HxROS Details Patient Name: Date of Service: John Kelp. 04/01/2022 11:00 A M Medical Record Number: [id number] Patient Account Number: [id number] Date of Birth/Sex: Treating RN: 05/19/1965 (57 y.o. M) Primary Care Provider: [name] Other Clinician: Referring Provider: Treating Provider/Extender: [name] in Treatment: 7 Hematologic/Lymphatic Medical History: Positive for: Lymphedema - pumps use daily Respiratory  Medical History: Positive for: Sleep Apnea - CPAP Cardiovascular Medical History: Positive for: Hypertension; Peripheral Venous Disease - needs ablation on left leg Musculoskeletal Medical History: Positive for: Osteoarthritis Negative for: Osteomyelitis Past Medical History Notes: abscess to left foot 3rd toe  11/28/2022 Neurologic Medical History: Positive for: Neuropathy Immunizations Implantable Devices [name], [name] ([id number]) [id number] Page 9 of 10 No devices added Hospitalization / Surgery History Type of Hospitalization/Surgery 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 Family and Social History Heart Disease: Yes - Mother; Lung Disease: Yes - Mother; Current every day smoker - 10 cig a day; Alcohol Use: Never; Drug Use: No History; Caffeine Use: Never; Financial Concerns: No; Food, Clothing or Shelter Needs: No; Support System Lacking: No; Transportation Concerns: No Electronic Signature([name]) Signed: 04/01/2022 12:10:40 PM By: Kalman Shan DO Entered By: Kalman Shan on 04/01/2022 12:05:43 -------------------------------------------------------------------------------- Total Contact Cast Details Patient Name: Date of Service: [name], [name] 04/01/2022 11:00 A M Medical Record Number: [id number] Patient Account Number: [id number] Date of Birth/Sex: Treating RN: 10/05/1965 (57 y.o. [place] Noe Primary Care Provider: [name] Other Clinician: Referring Provider: Treating Provider/Extender: [name] in Treatment: 7 T Contact Cast Applied for Wound Assessment: otal Wound #1 Left T Great oe Performed By: Physician Kalman Shan, DO Post Procedure Diagnosis Same as Pre-procedure Electronic Signature([name]) Signed: 04/01/2022 12:10:40 PM By: Kalman Shan DO Signed: 04/03/2022 4:33:02 PM By: Rhae Hammock RN Entered By: Rhae Hammock on 04/01/2022 11:28:22 -------------------------------------------------------------------------------- Total Contact Cast Details Patient Name: Date of Service: [name], CLEMMENS 04/01/2022 11:00 A M Medical Record Number: [id number] Patient Account Number: [id number] Date of Birth/Sex: Treating RN: [birth year] (57 y.o. [place] Noe Primary  Care Provider: [name] Other Clinician: Referring Provider: Treating Provider/Extender: [name] in Treatment: 7 T Contact Cast Applied for Wound Assessment: otal Wound #2 Left,Plantar Foot Performed By: Physician Kalman Shan, DO Post Procedure Diagnosis Same as Pre-procedure Electronic Signature([name]) Signed: 04/01/2022 12:10:40 PM By: Kalman Shan DO Signed: 04/03/2022 4:33:02 PM By: Rhae Hammock RN Entered By: Rhae Hammock on 04/01/2022 11:28:22 -------------------------------------------------------------------------------- SuperBill Details Patient Name: Date of Service: SELIG, STEUER 04/01/2022 Medical Record Number: [id number] Patient Account Number: [id number] Date of Birth/Sex: Treating RN: [birth year] (57 y.o. 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 G47.33 Obstructive sleep apnea (adult) (pediatric) I87.2 Venous insufficiency (chronic) (peripheral) Facility Procedures : CPT4 Code: [id number] Description: [id number] - DEB SUBQ TISSUE 20 SQ CM/< ICD-10 Diagnosis Description L97.522 Non-pressure chronic ulcer of other part of 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 Modifier: Quantity: 1 Physician Procedures : CPT4 Code Description Modifier [id number] - WC PHYS SUBQ TISS 20 SQ CM ICD-10 Diagnosis Description L97.522 Non-pressure chronic ulcer of other part of 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 Quantity: 1 Electronic Signature([name]) Signed: 04/01/2022 12:10:40 PM By: Kalman Shan DO Entered By: Kalman Shan  on 04/01/2022 12:10:18

## 2022-04-05 NOTE — Progress Notes (Signed)
RIESE, BOGART S ([id number]) [id number] Page 1 of 9 Visit Report for 04/01/2022 Arrival Information Details Patient Name: Date of Service: [name], [name] 04/01/2022 11:00 A M Medical Record Number: [id number] Patient Account Number: [id number] Date of Birth/Sex: Treating RN: 1965-11-05 (57 y.o. [name], [name] Primary Care Ammarie Matsuura: [name] Other Clinician: Referring Lasheba Stevens: Treating Aurelius Gildersleeve/Extender: [name] in Treatment: 7 Visit Information History Since Last Visit Added or deleted any medications: No Patient Arrived: Ambulatory Any new allergies or adverse reactions: No Arrival Time: 11:06 Had a fall or experienced change in No Accompanied By: self activities of daily living that may affect Transfer Assistance: None risk of falls: Patient Identification Verified: Yes Signs or symptoms of abuse/neglect since last visito No Secondary Verification Process Completed: Yes Hospitalized since last visit: No Patient Requires Transmission-Based No Implantable device outside of the clinic excluding No Precautions: cellular tissue based products placed in the center Patient Has Alerts: Yes since last visit: Patient Alerts: 11/27/21 ABI L 1.26 R1.09 Has Dressing in Place as Prescribed: Yes 11/27/2021 TBI L0.72 Has Footwear/Offloading in Place as Prescribed: Yes R0.66 Left: T Contact Cast otal Pain Present Now: No Electronic Signature(s) Signed: 04/03/2022 4:33:02 PM By: Rhae Hammock RN Entered By: Rhae Hammock on 04/01/2022 11:07:02 -------------------------------------------------------------------------------- Encounter Discharge Information Details Patient Name: Date of Service: [name]. 04/01/2022 11:00 A M Medical Record Number: [id number] Patient Account Number: [id number] Date of Birth/Sex: Treating RN: 04-May-1965 (57 y.o. [name], [name] Primary Care Yanni Quiroa:  [name] Other Clinician: Referring Dez Stauffer: Treating Lyrika Souders/Extender: [name] in Treatment: 7 Encounter Discharge Information Items Post Procedure Vitals Discharge Condition: Stable Temperature (F): 98.7 Ambulatory Status: Ambulatory Pulse (bpm): 74 Discharge Destination: Home Respiratory Rate (breaths/min): 17 Transportation: Private Auto Blood Pressure (mmHg): 120/80 Accompanied By: self Schedule Follow-up Appointment: Yes Clinical Summary of Care: Patient Declined Electronic Signature(s) Signed: 04/03/2022 4:33:02 PM By: Rhae Hammock RN Entered By: Rhae Hammock on 04/01/2022 12:46:06 -------------------------------------------------------------------------------- Lower Extremity Assessment Details Patient Name: Date of Service: [name]. 04/01/2022 11:00 A M Medical Record Number: [id number] Patient Account Number: [id number] Date of Birth/Sex: Treating RN: [birth year] (57 y.o. [name], [name] Primary Care Tymara Saur: [name] Other Clinician: Referring Kanoe Wanner: Treating Lei Dower/Extender: [name]: 7 Edema Assessment Left: [Left: Right] [Right: :] Assessed: [Left: Yes] [Right: No] Edema: [Left: Ye] [Right: s] Calf Left: Right: Point of Measurement: 43 cm From Medial Instep 42.5 cm Ankle Left: Right: Point of Measurement: 11 cm From Medial Instep 27.5 cm Vascular Assessment Pulses: Dorsalis Pedis Palpable: [Left:Yes] Posterior Tibial Palpable: [Left:Yes] Electronic Signature(s) Signed: 04/03/2022 4:33:02 PM By: Rhae Hammock RN Entered By: Rhae Hammock on 04/01/2022 11:09:00 -------------------------------------------------------------------------------- Multi Wound Chart Details Patient Name: Date of Service: [name]. 04/01/2022 11:00 A M Medical Record Number: [id number] Patient Account Number: [id number] Date of  Birth/Sex: Treating RN: [birth year] (57 y.o. 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 N/A Primary Etiology: Lymphedema, Sleep Apnea, Lymphedema, Sleep Apnea, N/A Comorbid History: Hypertension, Peripheral Venous Hypertension, Peripheral Venous Disease, Osteoarthritis, Neuropathy Disease, Osteoarthritis, Neuropathy 01/28/2022 01/14/2022 N/A Date Acquired: 7 7 N/A Weeks of Treatment: Open Open N/A Wound Status:  No No N/A Wound Recurrence: Yes Yes N/A Pending A mputation on Presentation: 0.2x0.2x0.2 0.9x0.5x0.5 N/A Measurements L x W x D (cm) 0.031 0.353 N/A A (cm) : rea 0.006 0.177 N/A Volume (cm) : 97.40% -199.20% N/A % Reduction in A rea: 95.00% -637.50% N/A % Reduction in Volume: 12 12 Starting Position 1 (o'clock): 12 12 Ending Position 1 (o'clock): 0.2 0.3 Maximum Distance 1 (cm): Yes Yes N/A UnderminingLAY, GEHRES S ([id number].pdf Page 3 of 9 Full Thickness Without Exposed Full Thickness Without Exposed N/A Classification: Support Structures Support Structures Medium Medium N/A Exudate A mount: Serosanguineous Serosanguineous N/A Exudate Type: red, brown red, brown N/A Exudate Color: Distinct, outline attached Distinct, outline attached N/A Wound Margin: Large (67-100%) Large (67-100%) N/A Granulation A mount: Pink Red, Pink N/A Granulation Quality: None Present (0%) None Present (0%) N/A Necrotic A mount: Fat Layer  (Subcutaneous Tissue): Yes Fat Layer (Subcutaneous Tissue): Yes N/A Exposed Structures: Fascia: No Fascia: No Tendon: No Tendon: No Muscle: No Muscle: No Joint: No Joint: No Bone: No Bone: No Large (67-100%) Small (1-33%) N/A Epithelialization: Debridement - Excisional Debridement - Excisional N/A Debridement: Pre-procedure Verification/Time Out 11:40 11:40 N/A Taken: Lidocaine Lidocaine N/A Pain Control: Callus, Subcutaneous, Slough Callus, Subcutaneous, Slough N/A Tissue Debrided: Skin/Subcutaneous Tissue Skin/Subcutaneous Tissue N/A Level: 0.04 0.45 N/A Debridement A (sq cm): rea Curette Curette N/A Instrument: Minimum Minimum N/A Bleeding: Pressure Pressure N/A Hemostasis A chieved: 0 0 N/A Procedural Pain: 0 0 N/A Post Procedural Pain: Procedure was tolerated well Procedure was tolerated well N/A Debridement Treatment Response: 0.2x0.2x0.2 0.9x0.5x0.5 N/A Post Debridement Measurements L x W x D (cm) 0.006 0.177 N/A Post Debridement Volume: (cm) Callus: Yes Callus: Yes N/A Periwound Skin Texture: Excoriation: No Excoriation: No Induration: No Induration: No Crepitus: No Crepitus: No Rash: No Rash: No Scarring: No Scarring: No Maceration: No Maceration: No N/A Periwound Skin Moisture: Dry/Scaly: No Dry/Scaly: No Atrophie Blanche: No Atrophie Blanche: No N/A Periwound Skin Color: Cyanosis: No Cyanosis: No Ecchymosis: No Ecchymosis: No Erythema: No Erythema: No Hemosiderin Staining: No Hemosiderin Staining: No Mottled: No Mottled: No Pallor: No Pallor: No Rubor: No Rubor: No No Abnormality N/A N/A Temperature: Debridement Debridement N/A Procedures Performed: T Contact Cast otal T Contact Cast otal Treatment Notes Electronic Signature(s) Signed: 04/01/2022 12:10:40 PM By: Kalman Shan DO Entered By: Kalman Shan on 04/01/2022  12:05:02 -------------------------------------------------------------------------------- Multi-Disciplinary Care Plan Details Patient Name: Date of Service: [name]. 04/01/2022 11:00 A M Medical Record Number: [id number] Patient Account Number: [id number] Date of Birth/Sex: Treating RN: Jun 04, 1965 (57 y.o. [name], [name] Primary Care Kyngston Pickelsimer: [name] Other Clinician: Referring Cheray Pardi: Treating Brittiny Levitz/Extender: [name] in Treatment: 7 Active Inactive Peripheral Neuropathy Nursing Diagnoses: Potential alteration in peripheral tissue perfusion (select prior to confirmation of diagnosis) Goals: Patient/caregiver will verbalize understanding of disease process and disease management JANIS, LOACH S ([id number]) [id number] Page 4 of 9 Date Initiated: 02/11/2022 Target Resolution Date: 03/28/2022 Goal Status: Active Interventions: Assess signs and symptoms of neuropathy upon admission and as needed Provide education on Management of Neuropathy upon discharge from the [facility]: Patient referred for customized footwear/offloading : 02/11/2022 Notes: Wound/Skin Impairment Nursing Diagnoses: Knowledge deficit related to ulceration/compromised skin integrity Goals: Patient/caregiver will verbalize understanding of skin care regimen Date Initiated: 02/11/2022 Target Resolution Date: 03/28/2022 Goal Status: Active Interventions: Assess patient/caregiver ability to perform ulcer/skin care regimen upon admission and as needed Assess ulceration(s) every visit Provide education on smoking Provide education on ulcer and skin care  Treatment Activities: Skin care regimen initiated : 02/11/2022 Smoking cessation education : 02/11/2022 Topical wound management initiated : 02/11/2022 Notes: Electronic Signature(s) Signed: 04/03/2022 4:33:02 PM By: Rhae Hammock RN Entered By:  Rhae Hammock on 04/01/2022 11:25:55 -------------------------------------------------------------------------------- Pain Assessment Details Patient Name: Date of Service: [name]. 04/01/2022 11:00 A M Medical Record Number: [id number] Patient Account Number: [id number] Date of Birth/Sex: Treating RN: May 21, 1965 (57 y.o. Erie Noe Primary Care Slater Mcmanaman: [name] Other Clinician: Referring Valentine Barney: Treating Richie Vadala/Extender: [name]: 7 Active Problems Location of Pain Severity and Description of Pain Patient Has Paino No Site Locations Pain Management and Medication Current Pain Management: IBRAHIMA, ZARZA ([id number]) [phone].pdf Page 5 of 9 Electronic Signature(s) Signed: 04/03/2022 4:33:02 PM By: Rhae Hammock RN Entered By: Rhae Hammock on 04/01/2022 11:08:55 -------------------------------------------------------------------------------- Patient/Caregiver Education Details Patient Name: Date of Service: [name] 3/5/2024andnbsp11:00 A M Medical Record Number: [id number] Patient Account Number: [id number] Date of Birth/Gender: Treating RN: 1965/06/28 (57 y.o. Erie Noe Primary Care Physician: [name] Other Clinician: Referring Physician: Treating Physician/Extender: [name] in Treatment: 7 Education Assessment Education Provided To: Patient Education Topics Provided Wound/Skin Impairment: Methods: Explain/Verbal Responses: Reinforcements needed, State content correctly Electronic Signature(s) Signed: 04/03/2022 4:33:02 PM By: Rhae Hammock RN Entered By: Rhae Hammock on 04/01/2022 11:26:05 -------------------------------------------------------------------------------- Wound Assessment Details Patient Name: Date of Service: [name]. 04/01/2022 11:00 A M Medical Record  Number: [id number] Patient Account Number: [id number] Date of Birth/Sex: Treating RN: 1965-08-30 (57 y.o. [name], [name] Primary Care Lorayne Getchell: [name] Other Clinician: Referring Diarra Kos: Treating Keen Ewalt/Extender: [name]: 7 Wound Status Wound Number: 1 Primary Neuropathic Ulcer-Non Diabetic Etiology: Wound Location: Left T Great oe Wound Open Wounding Event: Shear/Friction Status: Date Acquired: 01/28/2022 Comorbid Lymphedema, Sleep Apnea, Hypertension, Peripheral Venous Weeks Of Treatment: 7 History: Disease, Osteoarthritis, Neuropathy Clustered Wound: No Pending Amputation On Presentation Photos Wound Measurements Length: (cm) 0. Width: (cm) 0. Krell, Avanish S ([id number]) Depth: (cm) Area: (cm) Volume: (cm) 2 % Reduction in Area: 97.4% 2 % Reduction in Volume: 95% [id number] Page 6 of 9 0.2 Epithelialization: Large (67-100%) 0.031 Undermining: Yes 0.006 Starting Position (o'clock): 12 Ending Position (o'clock): 12 Maximum Distance: (cm) 0.2 Wound Description Classification: Full Thickness Without Exposed Support Structures Wound Margin: Distinct, outline attached Exudate Amount: Medium Exudate Type: Serosanguineous Exudate Color: red, brown Foul Odor After Cleansing: No Slough/Fibrino Yes Wound Bed Granulation Amount: Large (67-100%) Exposed Structure Granulation Quality: Pink Fascia Exposed: No Necrotic Amount: None Present (0%) Fat Layer (Subcutaneous Tissue) Exposed: Yes Tendon Exposed: No Muscle Exposed: No Joint Exposed: No Bone Exposed: No Periwound Skin Texture Texture Color No Abnormalities Noted: No No Abnormalities Noted: No Callus: Yes Atrophie Blanche: No Crepitus: No Cyanosis: No Excoriation: No Ecchymosis: No Induration: No Erythema: No Rash: No Hemosiderin Staining: No Scarring: No Mottled: No Pallor: No Moisture Rubor: No No  Abnormalities Noted: No Dry / Scaly: No Temperature / Pain Maceration: No Temperature: No Abnormality Treatment Notes Wound #1 (Toe Great) Wound Laterality: Left Cleanser Wound Cleanser Discharge Instruction: Cleanse the wound with wound cleanser prior to applying a clean dressing using gauze sponges, not tissue or cotton balls. Byram Ancillary Kit - 15 Day Supply Discharge Instruction: Use supplies as instructed; Kit contains: (15) Saline Bullets; (15) 3x3 Gauze; 15 pr Gloves Peri-Wound Care Topical Gentamicin Discharge Instruction: As directed by physician Mupirocin Ointment Discharge Instruction: Apply Mupirocin (Bactroban) as instructed Primary Dressing Hydrofera Blue Ready Transfer  Foam, 2.5x2.5 (in/in) Discharge Instruction: Apply directly to wound bed as directed Secondary Dressing Optifoam Non-Adhesive Dressing, 4x4 in Discharge Instruction: Apply x1 layer donut to aid in offloading. Woven Gauze Sponges 2x2 in Discharge Instruction: Apply over primary dressing as directed. Secured With Conforming Stretch Gauze Bandage, Sterile 2x75 (in/in) Discharge Instruction: Secure with stretch gauze as directed. 638M Medipore H Soft Cloth Surgical T ape, 4 x 10 (in/yd) Discharge Instruction: Secure with tape as directed. Compression TERYL, PEZZANO S ([id number]) [id number] Page 7 of 9 Compression Stockings Add-Ons Electronic Signature(s) Signed: 04/03/2022 4:33:02 PM By: Rhae Hammock RN Entered By: Rhae Hammock on 04/01/2022 11:21:42 -------------------------------------------------------------------------------- Wound Assessment Details Patient Name: Date of Service: [name]. 04/01/2022 11:00 A M Medical Record Number: [id number] Patient Account Number: [id number] Date of Birth/Sex: Treating RN: [birth year] (57 y.o. [name], [name] Primary Care Kashis Penley: [name] Other Clinician: Referring Bradan Congrove: Treating  Jamerion Cabello/Extender: [name]: 7 Wound Status Wound Number: 2 Primary Neuropathic Ulcer-Non Diabetic Etiology: Wound Location: Left, Plantar Foot Wound Open Wounding Event: Surgical Injury Status: Date Acquired: 01/14/2022 Comorbid Lymphedema, Sleep Apnea, Hypertension, Peripheral Venous Weeks Of Treatment: 7 History: Disease, Osteoarthritis, Neuropathy Clustered Wound: No Pending Amputation On Presentation Photos Wound Measurements Length: (cm) 0.9 Width: (cm) 0.5 Depth: (cm) 0.5 Area: (cm) 0.353 Volume: (cm) 0.177 % Reduction in Area: -199.2% % Reduction in Volume: -637.5% Epithelialization: Small (1-33%) Tunneling: No Undermining: Yes Starting Position (o'clock): 12 Ending Position (o'clock): 12 Maximum Distance: (cm) 0.3 Wound Description Classification: Full Thickness Without Exposed Support Structures Wound Margin: Distinct, outline attached Exudate Amount: Medium Exudate Type: Serosanguineous Exudate Color: red, brown Foul Odor After Cleansing: No Slough/Fibrino Yes Wound Bed Granulation Amount: Large (67-100%) Exposed Structure Granulation Quality: Red, Pink Fascia Exposed: No Necrotic Amount: None Present (0%) Fat Layer (Subcutaneous Tissue) Exposed: Yes Tendon Exposed: No Muscle Exposed: No Joint Exposed: No Bone Exposed: No Periwound Skin Texture Texture Color No Abnormalities Noted: No No Abnormalities Noted: No Callus: Yes Atrophie BlancheNOELL, FRESCH S ([id number]) [phone].pdf Page 8 of 9 Crepitus: No Cyanosis: No Excoriation: No Ecchymosis: No Induration: No Erythema: No Rash: No Hemosiderin Staining: No Scarring: No Mottled: No Pallor: No Moisture Rubor: No No Abnormalities Noted: No Dry / Scaly: No Maceration: No Treatment Notes Wound #2 (Foot) Wound Laterality: Plantar, Left Cleanser Wound Cleanser Discharge Instruction: Cleanse the wound with  wound cleanser prior to applying a clean dressing using gauze sponges, not tissue or cotton balls. Byram Ancillary Kit - 15 Day Supply Discharge Instruction: Use supplies as instructed; Kit contains: (15) Saline Bullets; (15) 3x3 Gauze; 15 pr Gloves Peri-Wound Care Topical Gentamicin Discharge Instruction: As directed by physician Mupirocin Ointment Discharge Instruction: Apply Mupirocin (Bactroban) as instructed Primary Dressing Promogran Prisma Matrix, 4.34 (sq in) (silver collagen) Discharge Instruction: Moisten collagen with saline or hydrogel Hydrofera Blue Ready Transfer Foam, 2.5x2.5 (in/in) Discharge Instruction: Apply directly to wound bed as directed Secondary Dressing Optifoam Non-Adhesive Dressing, 4x4 in Discharge Instruction: Apply x1 layer donut to aid in offloading. Woven Gauze Sponges 2x2 in Discharge Instruction: Apply over primary dressing as directed. Secured With Conforming Stretch Gauze Bandage, Sterile 2x75 (in/in) Discharge Instruction: Secure with stretch gauze as directed. 638M Medipore H Soft Cloth Surgical T ape, 4 x 10 (in/yd) Discharge Instruction: Secure with tape as directed. Compression Wrap Compression Stockings Add-Ons Electronic Signature(s) Signed: 04/03/2022 4:33:02 PM By: Rhae Hammock RN Entered By: Rhae Hammock on 04/01/2022 11:22:02 -------------------------------------------------------------------------------- Vitals Details Patient Name: Date of Service: John Mae  S. 04/01/2022 11:00 A M Medical Record Number: [id number] Patient Account Number: [id number] Date of Birth/Sex: Treating RN: Oct 14, 1965 (57 y.o. [name], [name] Primary Care Carel Schnee: [name] Other Clinician: Referring Oluwademilade Kellett: Treating Lashawn Orrego/Extender: [name]: 7 Vital Signs Time Taken: 11:07 Temperature (F): 98.6 Height (in): 75 Pulse (bpm): 65 Weight (lbs): 220 Respiratory Rate  (breaths/min): 17 Rames, Mcguire S ([id number]) [id number] Page 9 of 9 Body Mass Index (BMI): 27.5 Blood Pressure (mmHg): 126/75 Reference Range: 80 - 120 mg / dl Electronic Signature(s) Signed: 04/03/2022 4:33:02 PM By: Rhae Hammock RN Entered By: Rhae Hammock on 04/01/2022 11:08:51

## 2022-04-08 ENCOUNTER — Ambulatory Visit (HOSPITAL_BASED_OUTPATIENT_CLINIC_OR_DEPARTMENT_OTHER): Payer: BC Managed Care – PPO | Admitting: Internal Medicine

## 2022-04-09 ENCOUNTER — Encounter (HOSPITAL_BASED_OUTPATIENT_CLINIC_OR_DEPARTMENT_OTHER): Payer: BC Managed Care – PPO | Admitting: Internal Medicine

## 2022-04-09 DIAGNOSIS — L97522 Non-pressure chronic ulcer of other part of 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: Secondary | ICD-10-CM | POA: Diagnosis not present

## 2022-04-09 DIAGNOSIS — M1990 Unspecified osteoarthritis, unspecified site: Secondary | ICD-10-CM | POA: Diagnosis not present

## 2022-04-09 DIAGNOSIS — I10 Essential (primary) hypertension: Secondary | ICD-10-CM | POA: Diagnosis not present

## 2022-04-09 DIAGNOSIS — I872 Venous insufficiency (chronic) (peripheral): Secondary | ICD-10-CM | POA: Diagnosis not present

## 2022-04-09 DIAGNOSIS — F17210 Nicotine dependence, cigarettes, uncomplicated: Secondary | ICD-10-CM | POA: Diagnosis not present

## 2022-04-09 DIAGNOSIS — I890 Lymphedema, not elsewhere classified: Secondary | ICD-10-CM | POA: Diagnosis not present

## 2022-04-09 DIAGNOSIS — G9009 Other idiopathic peripheral autonomic neuropathy: Secondary | ICD-10-CM | POA: Diagnosis not present

## 2022-04-09 DIAGNOSIS — L02612 Cutaneous abscess of left foot: Secondary | ICD-10-CM | POA: Diagnosis not present

## 2022-04-09 DIAGNOSIS — G4733 Obstructive sleep apnea (adult) (pediatric): Secondary | ICD-10-CM | POA: Diagnosis not present

## 2022-04-11 NOTE — Progress Notes (Signed)
[name], [name] ([id number]) [id number] Page 1 of 9 Visit Report for 04/09/2022 Arrival Information Details Patient Name: Date of Service: [name], [name] 04/09/2022 12:30 PM Medical Record Number: [id number] Patient Account Number: [id number] Date of Birth/Sex: Treating RN: 05/22/1965 (57 y.o. [name] Primary Care Bettyjean Stefanski: [name] Other Clinician: Referring Maciah Schweigert: Treating Yakima Kreitzer/Extender: [name] in Treatment: 8 Visit Information History Since Last Visit Added or deleted any medications: No Patient Arrived: Ambulatory Any new allergies or adverse reactions: No Arrival Time: 12:33 Had a fall or experienced change in No Accompanied By: self activities of daily living that may affect Transfer Assistance: None risk of falls: Patient Identification Verified: Yes Signs or symptoms of abuse/neglect since last visito No Secondary Verification Process Completed: Yes Hospitalized since last visit: No Patient Requires Transmission-Based No Implantable device outside of the clinic excluding No Precautions: cellular tissue based products placed in the center Patient Has Alerts: Yes since last visit: Patient Alerts: 11/27/21 ABI L 1.26 R1.09 Has Dressing in Place as Prescribed: Yes 11/27/2021 TBI L0.72 Has Footwear/Offloading in Place as Prescribed: Yes R0.66 Left: T Contact Cast otal Pain Present Now: No Electronic Signature([name]) Signed: 04/09/2022 3:46:51 PM By: Adline Peals Entered By: Adline Peals on 04/09/2022 12:34:32 -------------------------------------------------------------------------------- Encounter Discharge Information Details Patient Name: Date of Service: [name]. 04/09/2022 12:30 PM Medical Record Number: [id number] Patient Account Number: [id number] Date of Birth/Sex: Treating RN: April 13, 1965 (57 y.o. [name] Primary Care Karista Aispuro:  [name] Other Clinician: Referring Lennon Richins: Treating Fabrizzio Marcella/Extender: [name] in Treatment: 8 Encounter Discharge Information Items Post Procedure Vitals Discharge Condition: Stable Temperature (F): 98.6 Ambulatory Status: Ambulatory Pulse (bpm): 75 Discharge Destination: Home Respiratory Rate (breaths/min): 18 Transportation: Private Auto Blood Pressure (mmHg): 151/76 Accompanied By: self Schedule Follow-up Appointment: Yes Clinical Summary of Care: Patient Declined Electronic Signature([name]) Signed: 04/09/2022 3:46:51 PM By: Adline Peals Entered By: Adline Peals on 04/09/2022 12:58:48 -------------------------------------------------------------------------------- Lower Extremity Assessment Details Patient Name: Date of Service: [name], [name] 04/09/2022 12:30 PM Medical Record Number: [id number] Patient Account Number: [id number] Date of Birth/Sex: Treating RN: October 15, 1965 (57 y.o. [name] Primary Care [name]: [name] Other Clinician: Referring Kellye Mizner: Treating Justice Milliron/Extender: [name]: 8 Edema Assessment Left: [Left: Right] [Right: :] Assessed: [Left: No] [Right: No] Edema: [Left: Ye] [Right: [name]] Calf Left: Right: Point of Measurement: 43 cm From Medial Instep 39.5 cm Ankle Left: Right: Point of Measurement: 11 cm From Medial Instep 27 cm Vascular Assessment Pulses: Dorsalis Pedis Palpable: [Left:Yes] Electronic Signature([name]) Signed: 04/09/2022 3:46:51 PM By: Adline Peals Entered By: Adline Peals on 04/09/2022 12:47:13 -------------------------------------------------------------------------------- Multi Wound Chart Details Patient Name: Date of Service: [name]. 04/09/2022 12:30 PM Medical Record Number: [id number] Patient Account Number: [id number] Date of Birth/Sex: Treating RN: 1965/06/28 (57 y.o.  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 N/A Primary Etiology: Lymphedema, Sleep Apnea, Lymphedema, Sleep Apnea, N/A Comorbid History: Hypertension, Peripheral Venous Hypertension, Peripheral Venous Disease, Osteoarthritis, Neuropathy Disease, Osteoarthritis, Neuropathy 01/28/2022 01/14/2022 N/A Date Acquired: 8 8 N/A Weeks of Treatment: Open Open N/A Wound Status: No No N/A Wound Recurrence: Yes Yes N/A Pending A mputation  on Presentation: 0.1x0.1x0.1 0.2x0.2x0.2 N/A Measurements L x W x D (cm) 0.008 0.031 N/A A (cm) : rea 0.001 0.006 N/A Volume (cm) : 99.30% 73.70% N/A % Reduction in A rea: 99.20% 75.00% N/A % Reduction in Volume: Full Thickness Without Exposed Full Thickness Without Exposed N/A Classification: Support Structures Support Structures None Present Medium N/A Exudate Amount: N/A Serosanguineous N/A Exudate Type: N/A red, brown N/A Exudate Color: Distinct, outline attached Distinct, outline attached N/A Wound MarginSAUEL, ROBNETT [name] ([id number] Page 3 of 9 Large (67-100%) Large (67-100%) N/A Granulation Amount: Pink Red, Pink N/A Granulation Quality: None Present (0%) Small (1-33%) N/A Necrotic Amount: Fat Layer (Subcutaneous Tissue): Yes Fat Layer (Subcutaneous Tissue): Yes N/A Exposed Structures: Fascia: No Fascia: No Tendon: No Tendon: No Muscle: No Muscle: No Joint: No Joint: No Bone: No Bone:  No Large (67-100%) Medium (34-66%) N/A Epithelialization: Debridement - Selective/Open Wound Debridement - Excisional N/A Debridement: Pre-procedure Verification/Time Out 12:55 12:55 N/A Taken: Lidocaine 4% Topical Solution Lidocaine 4% Topical Solution N/A Pain Control: Callus Subcutaneous, Slough N/A Tissue Debrided: Skin/Epidermis Skin/Subcutaneous Tissue N/A Level: 0.25 0.25 N/A Debridement A (sq cm): rea Curette Curette N/A Instrument: Minimum Minimum N/A Bleeding: Pressure Pressure N/A Hemostasis A chieved: Procedure was tolerated well Procedure was tolerated well N/A Debridement Treatment Response: 0.1x0.1x0.1 0.5x0.5x0.2 N/A Post Debridement Measurements L x W x D (cm) 0.001 0.039 N/A Post Debridement Volume: (cm) Callus: Yes Callus: Yes N/A Periwound Skin Texture: Excoriation: No Excoriation: No Induration: No Induration: No Crepitus: No Crepitus: No Rash: No Rash: No Scarring: No Scarring: No Maceration: No Maceration: No N/A Periwound Skin Moisture: Dry/Scaly: No Dry/Scaly: No Atrophie Blanche: No Atrophie Blanche: No N/A Periwound Skin Color: Cyanosis: No Cyanosis: No Ecchymosis: No Ecchymosis: No Erythema: No Erythema: No Hemosiderin Staining: No Hemosiderin Staining: No Mottled: No Mottled: No Pallor: No Pallor: No Rubor: No Rubor: No No Abnormality N/A N/A Temperature: Debridement Debridement N/A Procedures Performed: T Contact Cast otal Treatment Notes Wound #1 (Toe Great) Wound Laterality: Left Cleanser Wound Cleanser Discharge Instruction: Cleanse the wound with wound cleanser prior to applying a clean dressing using gauze sponges, not tissue or cotton balls. Byram Ancillary Kit - 15 Day Supply Discharge Instruction: Use supplies as instructed; Kit contains: (15) Saline Bullets; (15) 3x3 Gauze; 15 pr Gloves Peri-Wound Care Topical Gentamicin Discharge Instruction: As directed by physician Mupirocin Ointment Discharge  Instruction: Apply Mupirocin (Bactroban) as instructed Primary Dressing Hydrofera Blue Ready Transfer Foam, 2.5x2.5 (in/in) Discharge Instruction: Apply directly to wound bed as directed Secondary Dressing Optifoam Non-Adhesive Dressing, 4x4 in Discharge Instruction: Apply x1 layer donut to aid in offloading. Woven Gauze Sponges 2x2 in Discharge Instruction: Apply over primary dressing as directed. Secured With Conforming Stretch Gauze Bandage, Sterile 2x75 (in/in) Discharge Instruction: Secure with stretch gauze as directed. 94M Medipore H Soft Cloth Surgical T ape, 4 x 10 (in/yd) Discharge Instruction: Secure with tape as directed. Compression JR, KOVACICH [name] ([id number]) [id number] Page 4 of 9 Compression Stockings Add-Ons Wound #2 (Foot) Wound Laterality: Plantar, Left Cleanser Wound Cleanser Discharge Instruction: Cleanse the wound with wound cleanser prior to applying a clean dressing using gauze sponges, not tissue or cotton balls. Byram Ancillary Kit - 15 Day Supply Discharge Instruction: Use supplies as instructed; Kit contains: (15) Saline Bullets; (15) 3x3 Gauze; 15 pr Gloves Peri-Wound Care Topical Gentamicin Discharge Instruction: As directed by physician Mupirocin Ointment Discharge Instruction: Apply Mupirocin (Bactroban) as instructed Primary Dressing Promogran Prisma Matrix, 4.34 (sq in) (silver collagen) Discharge Instruction: Moisten collagen  with saline or hydrogel Hydrofera Blue Ready Transfer Foam, 2.5x2.5 (in/in) Discharge Instruction: Apply directly to wound bed as directed Secondary Dressing Optifoam Non-Adhesive Dressing, 4x4 in Discharge Instruction: Apply x1 layer donut to aid in offloading. Woven Gauze Sponges 2x2 in Discharge Instruction: Apply over primary dressing as directed. Secured With Conforming Stretch Gauze Bandage, Sterile 2x75 (in/in) Discharge Instruction: Secure with stretch gauze as directed. 70M  Medipore H Soft Cloth Surgical T ape, 4 x 10 (in/yd) Discharge Instruction: Secure with tape as directed. Compression Wrap Compression Stockings Add-Ons Electronic Signature([name]) Signed: 04/10/2022 3:34:03 PM By: Kalman Shan DO Entered By: Kalman Shan on 04/09/2022 13:46:18 -------------------------------------------------------------------------------- Multi-Disciplinary Care Plan Details Patient Name: Date of Service: [name], [name] 04/09/2022 12:30 PM Medical Record Number: [id number] Patient Account Number: [id number] Date of Birth/Sex: Treating RN: 06/27/1965 (57 y.o. [name] Primary Care Severino Paolo: [name] Other Clinician: Referring Calypso Hagarty: Treating Maleena Eddleman/Extender: [name] in Treatment: 8 Active Inactive Peripheral Neuropathy Nursing Diagnoses: Potential alteration in peripheral tissue perfusion (select prior to confirmation of diagnosis) Goals: Patient/caregiver will verbalize understanding of disease process and disease management Date Initiated: 02/11/2022 Target Resolution Date: 05/30/2022 ALLEX, MINASSIAN ([id number]) [phone].pdf Page 5 of 9 Goal Status: Active Interventions: Assess signs and symptoms of neuropathy upon admission and as needed Provide education on Management of Neuropathy upon discharge from the [place]: Patient referred for customized footwear/offloading : 02/11/2022 Notes: Electronic Signature([name]) Signed: 04/09/2022 3:46:51 PM By: Adline Peals Entered By: Adline Peals on 04/09/2022 12:58:00 -------------------------------------------------------------------------------- Pain Assessment Details Patient Name: Date of Service: VAYLEN, KOPPEL 04/09/2022 12:30 PM Medical Record Number: [id number] Patient Account Number: [id number] Date of Birth/Sex: Treating RN: 1965/06/27 (57 y.o. [name] Primary Care  Laiklyn Pilkenton: [name] Other Clinician: Referring Emmajo Bennette: Treating Narciso Stoutenburg/Extender: [name] in Treatment: 8 Active Problems Location of Pain Severity and Description of Pain Patient Has Paino No Site Locations Rate the pain. Current Pain Level: 0 Pain Management and Medication Current Pain Management: Electronic Signature([name]) Signed: 04/09/2022 3:46:51 PM By: Adline Peals Entered By: Adline Peals on 04/09/2022 12:34:45 -------------------------------------------------------------------------------- Patient/Caregiver Education Details Patient Name: Date of Service: [name] 3/13/2024andnbsp12:30 PM Medical Record Number: [id number] Patient Account Number: [id number] Date of Birth/Gender: Treating RN: [birth year] (57 y.o. [name] Primary Care Physician: [name] Other Clinician: Referring Physician: Treating Physician/Extender: [name] in Treatment: 8 Education Assessment Education Provided To: KINZER, HORNEY ([id number]) [id number] Page 6 of 9 Patient Education Topics Provided Wound/Skin Impairment: Methods: Explain/Verbal Responses: Reinforcements needed, State content correctly Electronic Signature([name]) Signed: 04/09/2022 3:46:51 PM By: Adline Peals Entered By: Adline Peals on 04/09/2022 12:58:09 -------------------------------------------------------------------------------- Wound Assessment Details Patient Name: Date of Service: DONLD, LUEBBE 04/09/2022 12:30 PM Medical Record Number: [id number] Patient Account Number: [id number] Date of Birth/Sex: Treating RN: 09/08/1965 (57 y.o. [name] Primary Care Mykel Mohl: [name] Other Clinician: Referring Sayana Salley: Treating Youcef Klas/Extender: [name]: 8 Wound Status Wound Number: 1  Primary Neuropathic Ulcer-Non Diabetic Etiology: Wound Location: Left T Great oe Wound Open Wounding Event: Shear/Friction Status: Date Acquired: 01/28/2022 Comorbid Lymphedema, Sleep Apnea, Hypertension, Peripheral Venous Weeks Of Treatment: 8 History: Disease, Osteoarthritis, Neuropathy Clustered Wound: No Pending Amputation On Presentation Photos Wound Measurements Length: (cm) 0.1 Width: (cm) 0.1 Depth: (cm) 0.1 Area: (cm) 0.008 Volume: (cm) 0.001 % Reduction in Area: 99.3% % Reduction in Volume: 99.2% Epithelialization: Large (67-100%) Tunneling: No Undermining: No Wound Description Classification: Full Thickness Without Exposed  Suppor Wound Margin: Distinct, outline attached Exudate Amount: None Present t Structures Foul Odor After Cleansing: No Slough/Fibrino No Wound Bed Granulation Amount: Large (67-100%) Exposed Structure Granulation Quality: Pink Fascia Exposed: No Necrotic Amount: None Present (0%) Fat Layer (Subcutaneous Tissue) Exposed: Yes Tendon Exposed: No Muscle Exposed: No Joint Exposed: No Bone Exposed: No Periwound Skin Texture Texture Color No Abnormalities Noted: No No Abnormalities Noted: No Callus: Yes Atrophie BlancheRENNARD, DERUITER [name] ([id number]) [id number] Page 7 of 9 Crepitus: No Cyanosis: No Excoriation: No Ecchymosis: No Induration: No Erythema: No Rash: No Hemosiderin Staining: No Scarring: No Mottled: No Pallor: No Moisture Rubor: No No Abnormalities Noted: No Dry / Scaly: No Temperature / Pain Maceration: No Temperature: No Abnormality Treatment Notes Wound #1 (Toe Great) Wound Laterality: Left Cleanser Wound Cleanser Discharge Instruction: Cleanse the wound with wound cleanser prior to applying a clean dressing using gauze sponges, not tissue or cotton balls. Byram Ancillary Kit - 15 Day Supply Discharge Instruction: Use supplies as instructed; Kit contains: (15) Saline Bullets;  (15) 3x3 Gauze; 15 pr Gloves Peri-Wound Care Topical Gentamicin Discharge Instruction: As directed by physician Mupirocin Ointment Discharge Instruction: Apply Mupirocin (Bactroban) as instructed Primary Dressing Hydrofera Blue Ready Transfer Foam, 2.5x2.5 (in/in) Discharge Instruction: Apply directly to wound bed as directed Secondary Dressing Optifoam Non-Adhesive Dressing, 4x4 in Discharge Instruction: Apply x1 layer donut to aid in offloading. Woven Gauze Sponges 2x2 in Discharge Instruction: Apply over primary dressing as directed. Secured With Conforming Stretch Gauze Bandage, Sterile 2x75 (in/in) Discharge Instruction: Secure with stretch gauze as directed. 85M Medipore H Soft Cloth Surgical T ape, 4 x 10 (in/yd) Discharge Instruction: Secure with tape as directed. Compression Wrap Compression Stockings Add-Ons Electronic Signature([name]) Signed: 04/09/2022 3:46:51 PM By: Adline Peals Entered By: Adline Peals on 04/09/2022 12:50:46 -------------------------------------------------------------------------------- Wound Assessment Details Patient Name: Date of Service: CRIT, SCHOR 04/09/2022 12:30 PM Medical Record Number: [id number] Patient Account Number: [id number] Date of Birth/Sex: Treating RN: 07-Mar-1965 (57 y.o. [name] Primary Care Hassel Uphoff: [name] Other Clinician: Referring Ronni Osterberg: Treating Livio Ledwith/Extender: [name]: 8 Wound Status Wound Number: 2 Primary Neuropathic Ulcer-Non Diabetic Etiology: Wound Location: Left, Plantar Foot Wound Open Wounding Event: Surgical Injury Status: Date Acquired: 01/14/2022 Comorbid Lymphedema, Sleep Apnea, Hypertension, Peripheral Venous Weeks Of Treatment: 8 History: Disease, Osteoarthritis, Neuropathy Clustered Wound: No Michl, Bon [name] ([id number] Page 8 of 9 Pending Amputation On  Presentation Photos Wound Measurements Length: (cm) 0.2 Width: (cm) 0.2 Depth: (cm) 0.2 Area: (cm) 0.031 Volume: (cm) 0.006 % Reduction in Area: 73.7% % Reduction in Volume: 75% Epithelialization: Medium (34-66%) Tunneling: No Undermining: No Wound Description Classification: Full Thickness Without Exposed Support Structures Wound Margin: Distinct, outline attached Exudate Amount: Medium Exudate Type: Serosanguineous Exudate Color: red, brown Foul Odor After Cleansing: No Slough/Fibrino Yes Wound Bed Granulation Amount: Large (67-100%) Exposed Structure Granulation Quality: Red, Pink Fascia Exposed: No Necrotic Amount: Small (1-33%) Fat Layer (Subcutaneous Tissue) Exposed: Yes Necrotic Quality: Adherent Slough Tendon Exposed: No Muscle Exposed: No Joint Exposed: No Bone Exposed: No Periwound Skin Texture Texture Color No Abnormalities Noted: No No Abnormalities Noted: No Callus: Yes Atrophie Blanche: No Crepitus: No Cyanosis: No Excoriation: No Ecchymosis: No Induration: No Erythema: No Rash: No Hemosiderin Staining: No Scarring: No Mottled: No Pallor: No Moisture Rubor: No No Abnormalities Noted: No Dry / Scaly: No Maceration: No Treatment Notes Wound #2 (Foot) Wound Laterality: Plantar, Left Cleanser Wound Cleanser Discharge Instruction: Cleanse the wound with wound cleanser prior  to applying a clean dressing using gauze sponges, not tissue or cotton balls. Byram Ancillary Kit - 15 Day Supply Discharge Instruction: Use supplies as instructed; Kit contains: (15) Saline Bullets; (15) 3x3 Gauze; 15 pr Gloves Peri-Wound Care Topical Gentamicin Discharge Instruction: As directed by physician Mupirocin Ointment Discharge Instruction: Apply Mupirocin (Bactroban) as instructed Primary Dressing Promogran Prisma Matrix, 4.34 (sq in) (silver collagen) Woon, Kimoni [name] ([id number] Page 9 of 9 Discharge Instruction:  Moisten collagen with saline or hydrogel Hydrofera Blue Ready Transfer Foam, 2.5x2.5 (in/in) Discharge Instruction: Apply directly to wound bed as directed Secondary Dressing Optifoam Non-Adhesive Dressing, 4x4 in Discharge Instruction: Apply x1 layer donut to aid in offloading. Woven Gauze Sponges 2x2 in Discharge Instruction: Apply over primary dressing as directed. Secured With Conforming Stretch Gauze Bandage, Sterile 2x75 (in/in) Discharge Instruction: Secure with stretch gauze as directed. 66M Medipore H Soft Cloth Surgical T ape, 4 x 10 (in/yd) Discharge Instruction: Secure with tape as directed. Compression Wrap Compression Stockings Add-Ons Electronic Signature([name]) Signed: 04/09/2022 3:46:51 PM By: Adline Peals Entered By: Adline Peals on 04/09/2022 12:51:08 -------------------------------------------------------------------------------- Vitals Details Patient Name: Date of Service: [name]. 04/09/2022 12:30 PM Medical Record Number: [id number] Patient Account Number: [id number] Date of Birth/Sex: Treating RN: 1965/09/23 (57 y.o. [name] Primary Care Shakiyah Cirilo: [name] Other Clinician: Referring Chrisette Man: Treating Cass Vandermeulen/Extender: [name] in Treatment: 8 Vital Signs Time Taken: 12:35 Temperature (F): 98.6 Height (in): 75 Pulse (bpm): 75 Weight (lbs): 220 Respiratory Rate (breaths/min): 16 Body Mass Index (BMI): 27.5 Blood Pressure (mmHg): 151/76 Reference Range: 80 - 120 mg / dl Electronic Signature([name]) Signed: 04/09/2022 3:46:51 PM By: Adline Peals Entered By: Adline Peals on 04/09/2022 12:36:38

## 2022-04-11 NOTE — Progress Notes (Signed)
[name] [name] ([id number]) [id number] Page 1 of 10 Visit Report for 04/09/2022 Chief Complaint Document Details Patient Name: Date of Service: [name], [name] 04/09/2022 12:30 PM Medical Record Number: [id number] Patient Account Number: [id number] Date of Birth/Sex: Treating RN: 01/31/1965 (57 y.o. M) Primary Care Provider: Merrilee [name] Other Clinician: Referring Provider: Treating Provider/Extender: [name] in Treatment: 8 Information Obtained from: Patient Chief Complaint 02/11/2022 left great toe wound and fourth sub-met head wound Electronic Signature([name]) Signed: 04/10/2022 3:34:03 PM By: Kalman Shan DO Entered By: Kalman Shan on 04/09/2022 13:46:29 -------------------------------------------------------------------------------- Debridement Details Patient Name: Date of Service: John Kelp. 04/09/2022 12:30 PM Medical Record Number: [id number] Patient Account Number: [id number] Date of Birth/Sex: Treating RN: 05-03-1965 (57 y.o. [name] Primary Care Provider: Merrilee [name] Other Clinician: Referring Provider: Treating Provider/Extender: [name] in Treatment: 8 Debridement Performed for Assessment: Wound #1 Left T Great oe Performed By: Physician Kalman Shan, DO Debridement Type: Debridement Level of Consciousness (Pre-procedure): Awake and Alert Pre-procedure Verification/Time Out Yes - 12:55 Taken: Start Time: 12:55 Pain Control: Lidocaine 4% T opical Solution T Area Debrided (L x W): otal 0.5 (cm) x 0.5 (cm) = 0.25 (cm) Tissue and other material debrided: Callus, Skin: Epidermis Level: Skin/Epidermis Debridement Description: Selective/Open Wound Instrument: Curette Bleeding: Minimum Hemostasis Achieved: Pressure Response to Treatment: Procedure was tolerated well Level of Consciousness (Post- Awake and  Alert procedure): Post Debridement Measurements of Total Wound Length: (cm) 0.1 Width: (cm) 0.1 Depth: (cm) 0.1 Volume: (cm) 0.001 Character of Wound/Ulcer Post Debridement: Improved Post Procedure Diagnosis Same as Pre-procedure Notes scribed for Dr. Heber [place] by Adline Peals, RN Electronic Signature([name]) Signed: 04/09/2022 3:46:51 PM By: Adline Peals Signed: 04/10/2022 3:34:03 PM By: Kalman Shan DO Entered By: Adline Peals on 04/09/2022 12:56:28 Baig, [name] [name] ([id number]) [id number] Page 2 of 10 -------------------------------------------------------------------------------- Debridement Details Patient Name: Date of Service: [name] 04/09/2022 12:30 PM Medical Record Number: [id number] Patient Account Number: [id number] Date of Birth/Sex: Treating RN: 12/23/1965 (57 y.o. [name] Primary Care Provider: Merrilee [name] Other Clinician: Referring Provider: Treating Provider/Extender: [name] in Treatment: 8 Debridement Performed for Assessment: Wound #2 Left,Plantar Foot Performed By: Physician Kalman Shan, DO Debridement Type: Debridement Level of Consciousness (Pre-procedure): Awake and Alert Pre-procedure Verification/Time Out Yes - 12:55 Taken: Start Time: 12:55 Pain Control: Lidocaine 4% T opical Solution T Area Debrided (L x W): otal 0.5 (cm) x 0.5 (cm) = 0.25 (cm) Tissue and other material debrided: Slough, Subcutaneous, Slough Level: Skin/Subcutaneous Tissue Debridement Description: Excisional Instrument: Curette Bleeding: Minimum Hemostasis Achieved: Pressure Response to Treatment: Procedure was tolerated well Level of Consciousness (Post- Awake and Alert procedure): Post Debridement Measurements of Total Wound Length: (cm) 0.5 Width: (cm) 0.5 Depth: (cm) 0.2 Volume: (cm) 0.039 Character of Wound/Ulcer Post Debridement: Improved Post  Procedure Diagnosis Same as Pre-procedure Notes scribed for Dr. Heber [place] by Adline Peals, RN Electronic Signature([name]) Signed: 04/09/2022 3:46:51 PM By: Adline Peals Signed: 04/10/2022 3:34:03 PM By: Kalman Shan DO Entered By: Adline Peals on 04/09/2022 12:57:20 -------------------------------------------------------------------------------- HPI Details Patient Name: Date of Service: John Kelp. 04/09/2022 12:30 PM Medical Record Number: [id number] Patient Account Number: [id number] Date of Birth/Sex: Treating RN: [birth year] (56 y.o. M) Primary Care Provider: Merrilee [name] Other Clinician: Referring Provider: Treating Provider/Extender: [name] in Treatment: 8 History of Present Illness HPI Description: 02/11/2022 [name] is a 57 year old male with a past medical history of current tobacco user, OSA, venous insufficiency  and peripheral neuropathy that presents the clinic for a 2-week history of ulcer to the left great toe and 2-77-month history of nonhealing ulcer to the left plantar foot. He works in a [employer] and is on his feet all day. He wears steel toed boots. He is currently out of work due to his wounds. On 11/26/2021 he was admitted to the hospital for cellulitis and abscess to the left foot. 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 He states he has been on doxycycline for 10 weeks. He just completed this course. He uses regular tennis shoes for walking. He is not offloading the area. He is using Betadine for wound dressings. He has been following with Dr. Posey Pronto, podiatry who Referred to our clinic. 1/23; patient presents for follow-up. He has been using Medihoney and Hydrofera Blue to the wound beds Along with his surgical shoe and offloading peg assist. He has no issues or complaints today. He denies signs of infection. 1/30; patient presents for follow-up. He has been  using Medihoney and Hydrofera Blue to the wound beds. He has no issues or complaints today. Plan is for the total contact cast and patient is agreeable to move forward with this. 2/1; patient presents for follow-up. We have been using antibiotic ointment with Hydrofera Blue to the wound beds under the total contact cast. He presents [name], [name] ([id number]) [id number] Page 3 of 10 today for his obligatory cast change. He had no issues with the cast. 2/6; patient presents for follow-up. We have been using antibiotic ointment with Hydrofera Blue to the wound beds under the total contact cast. He has no issues or complaints today. He has tolerated the cast well. 2/13; patient presents for follow-up. We have been using the total contact cast to the left lower extremity. We have been using Hydrofera Blue and antibiotic ointment to the wound beds. He has no issues or complaints today. 2/20; patient presents for follow-up. We were using the total contact cast along with Hydrofera Blue and antibiotic ointment. He states the cast was slightly uncomfortable over the past week. 2/27; Patient presents for follow-up. We have been using Hydrofera Blue with antibiotic ointment under the total contact cast. He tolerated the cast well and has no issues or complaints today. 3/5; Patient presents for follow-up. We have been using Hydrofera Blue with antibiotic ointment under the total contact cast. He tolerated the cast well and has no issues or complaints today. 3/13; patient presents for follow-up. We have been using Hydrofera Blue with collagen to the left plantar foot wound and Hydrofera Blue with antibiotic ointment to the left toe wound all under the total contact cast. He had no issues or complaints today. Electronic Signature([name]) Signed: 04/10/2022 3:34:03 PM By: Kalman Shan DO Entered By: Kalman Shan on 04/09/2022  13:47:04 -------------------------------------------------------------------------------- Physical Exam Details Patient Name: Date of Service: [name], [name] 04/09/2022 12:30 PM Medical Record Number: [id number] Patient Account Number: [id number] Date of Birth/Sex: Treating RN: [birth year] (57 y.o. M) Primary Care Provider: Merrilee [name] Other Clinician: Referring Provider: Treating Provider/Extender: [name] in Treatment: 8 Constitutional respirations regular, non-labored and within target range for patient.. Cardiovascular 2+ dorsalis pedis/posterior tibialis pulses. Psychiatric pleasant and cooperative. Notes Left foot: T the plantar aspect of the great toe there is an open wound with granulation tissue, non viable tissue and surrounding callus. T the plantar aspect o o of the foot there is an open wound with granulation tissue, slough, pocket of  increased depth in the center. No probing to bone. No signs of surrounding soft tissue infection to any of the wound beds. Electronic Signature([name]) Signed: 04/10/2022 3:34:03 PM By: Kalman Shan DO Entered By: Kalman Shan on 04/09/2022 13:47:36 -------------------------------------------------------------------------------- Physician Orders Details Patient Name: Date of Service: [name], [name] 04/09/2022 12:30 PM Medical Record Number: [id number] Patient Account Number: [id number] Date of Birth/Sex: Treating RN: 10-01-1965 (57 y.o. [name] Primary Care Provider: Merrilee [name] Other Clinician: Referring Provider: Treating Provider/Extender: [name] in Treatment: 8 Verbal / Phone Orders: No Diagnosis Coding Follow-up Appointments ppointment in 1 week. - Dr. Heber [place] and Allayne Butcher Rm # 9 Return A Anesthetic (In clinic) Topical Lidocaine 4% applied to wound bed Bathing/ Shower/ Hygiene May shower with protection but do not get  wound dressing([name]) wet. Protect dressing([name]) with water repellant cover (for example, large plastic DODGE, FRIEDRICHS [name] ([id number]) [id number] Page 4 of 10 bag) or a cast cover and may then take shower. Edema Control - Lymphedema / SCD / Other Elevate legs to the level of the heart or above for 30 minutes daily and/or when sitting for 3-4 times a day throughout the day. Avoid standing for long periods of time. Off-Loading Total Contact Cast to Left Lower Extremity - Size 4 Wound Treatment Wound #1 - T Great oe Wound Laterality: Left Cleanser: Wound Cleanser 1 x Per Week/30 Days Discharge Instructions: Cleanse the wound with wound cleanser prior to applying a clean dressing using gauze sponges, not tissue or cotton balls. Cleanser: Byram Ancillary Kit - 15 Day Supply (Generic) 1 x Per Week/30 Days Discharge Instructions: Use supplies as instructed; Kit contains: (15) Saline Bullets; (15) 3x3 Gauze; 15 pr Gloves Topical: Gentamicin 1 x Per Week/30 Days Discharge Instructions: As directed by physician Topical: Mupirocin Ointment 1 x Per Week/30 Days Discharge Instructions: Apply Mupirocin (Bactroban) as instructed Prim Dressing: Hydrofera Blue Ready Transfer Foam, 2.5x2.5 (in/in) (Generic) 1 x Per Week/30 Days ary Discharge Instructions: Apply directly to wound bed as directed Secondary Dressing: Optifoam Non-Adhesive Dressing, 4x4 in (Generic) 1 x Per Week/30 Days Discharge Instructions: Apply x1 layer donut to aid in offloading. Secondary Dressing: Woven Gauze Sponges 2x2 in (Generic) 1 x Per Week/30 Days Discharge Instructions: Apply over primary dressing as directed. Secured With: Child psychotherapist, Sterile 2x75 (in/in) (Generic) 1 x Per Week/30 Days Discharge Instructions: Secure with stretch gauze as directed. Secured With: 75M Medipore H Soft Cloth Surgical T ape, 4 x 10 (in/yd) (Generic) 1 x Per Week/30 Days Discharge Instructions: Secure with  tape as directed. Wound #2 - Foot Wound Laterality: Plantar, Left Cleanser: Wound Cleanser 1 x Per Week/30 Days Discharge Instructions: Cleanse the wound with wound cleanser prior to applying a clean dressing using gauze sponges, not tissue or cotton balls. Cleanser: Byram Ancillary Kit - 15 Day Supply (Generic) 1 x Per Week/30 Days Discharge Instructions: Use supplies as instructed; Kit contains: (15) Saline Bullets; (15) 3x3 Gauze; 15 pr Gloves Topical: Gentamicin 1 x Per Week/30 Days Discharge Instructions: As directed by physician Topical: Mupirocin Ointment 1 x Per Week/30 Days Discharge Instructions: Apply Mupirocin (Bactroban) as instructed Prim Dressing: Promogran Prisma Matrix, 4.34 (sq in) (silver collagen) 1 x Per Week/30 Days ary Discharge Instructions: Moisten collagen with saline or hydrogel Prim Dressing: Hydrofera Blue Ready Transfer Foam, 2.5x2.5 (in/in) (Generic) 1 x Per Week/30 Days ary Discharge Instructions: Apply directly to wound bed as directed Secondary Dressing: Optifoam Non-Adhesive Dressing, 4x4 in (Generic) 1 x Per Week/30 Days Discharge  Instructions: Apply x1 layer donut to aid in offloading. Secondary Dressing: Woven Gauze Sponges 2x2 in (Generic) 1 x Per Week/30 Days Discharge Instructions: Apply over primary dressing as directed. Secured With: Child psychotherapist, Sterile 2x75 (in/in) (Generic) 1 x Per Week/30 Days Discharge Instructions: Secure with stretch gauze as directed. Secured With: 2M Medipore H Soft Cloth Surgical T ape, 4 x 10 (in/yd) (Generic) 1 x Per Week/30 Days Discharge Instructions: Secure with tape as directed. Patient Medications llergies: No Known Drug Allergies A Notifications Medication Indication Start End 04/09/2022 lidocaine DOSE topical 4 % cream - cream topical Mceachron, Dalen [name] ([id number]) [phone].pdf Page 5 of 10 Electronic Signature([name]) Signed: 04/10/2022 3:34:03 PM By: Kalman Shan DO Entered By: Kalman Shan on 04/09/2022 13:47:43 -------------------------------------------------------------------------------- Problem List Details Patient Name: Date of Service: [name], [name] 04/09/2022 12:30 PM Medical Record Number: [id number] Patient Account Number: [id number] Date of Birth/Sex: Treating RN: December 27, 1965 (57 y.o. M) Primary Care Provider: Merrilee [name] Other Clinician: Referring Provider: Treating Provider/Extender: [name] in Treatment: 8 Active Problems ICD-10 Encounter Code Description Active Date MDM Diagnosis L97.522 Non-pressure chronic ulcer of other part of 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 02/11/2022 No Yes G90.09 Other idiopathic peripheral autonomic neuropathy 02/11/2022 No Yes G47.33 Obstructive sleep apnea (adult) (pediatric) 02/11/2022 No Yes I87.2 Venous insufficiency (chronic) (peripheral) 02/11/2022 No Yes Inactive Problems Resolved Problems Electronic Signature([name]) Signed: 04/10/2022 3:34:03 PM By: Kalman Shan DO Entered By: Kalman Shan on 04/09/2022 13:46:12 -------------------------------------------------------------------------------- Progress Note Details Patient Name: Date of Service: John Kelp. 04/09/2022 12:30 PM Medical Record Number: [id number] Patient Account Number: [id number] Date of Birth/Sex: Treating RN: 1965/07/15 (57 y.o. M) Primary Care Provider: Merrilee [name] Other Clinician: Referring Provider: Treating Provider/Extender: [name] in Treatment: 8 Subjective Chief Complaint Information obtained from Patient 02/11/2022 left great toe wound and fourth sub-met head wound History of Present Illness (HPI) 02/11/2022 [name] is a 57 year old male with a past medical history of current tobacco user, OSA, venous insufficiency and peripheral neuropathy that presents the clinic for a 2-week history of ulcer  to the left great toe and 2-16-month history of nonhealing ulcer to the left plantar foot. He works in a [employer] and is on his feet all day. He wears steel toed boots. He is currently out of work due to his wounds. On 11/26/2021 he was admitted to the hospital for cellulitis and abscess to the left foot. 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 He states he has been on doxycycline for 10 weeks. He just completed this course. He uses regular tennis shoes for walking. He is not offloading the area. He is using Betadine for wound dressings. He [name], [name] ([id number]) [id number] Page 6 of 10 has been following with Dr. Posey Pronto, podiatry who Referred to our clinic. 1/23; patient presents for follow-up. He has been using Medihoney and Hydrofera Blue to the wound beds Along with his surgical shoe and offloading peg assist. He has no issues or complaints today. He denies signs of infection. 1/30; patient presents for follow-up. He has been using Medihoney and Hydrofera Blue to the wound beds. He has no issues or complaints today. Plan is for the total contact cast and patient is agreeable to move forward with this. 2/1; patient presents for follow-up. We have been using antibiotic ointment with Hydrofera Blue to the wound beds under the total contact cast. He presents today for his obligatory cast change. He had  no issues with the cast. 2/6; patient presents for follow-up. We have been using antibiotic ointment with Hydrofera Blue to the wound beds under the total contact cast. He has no issues or complaints today. He has tolerated the cast well. 2/13; patient presents for follow-up. We have been using the total contact cast to the left lower extremity. We have been using Hydrofera Blue and antibiotic ointment to the wound beds. He has no issues or complaints today. 2/20; patient presents for follow-up. We were using the total contact cast  along with Hydrofera Blue and antibiotic ointment. He states the cast was slightly uncomfortable over the past week. 2/27; Patient presents for follow-up. We have been using Hydrofera Blue with antibiotic ointment under the total contact cast. He tolerated the cast well and has no issues or complaints today. 3/5; Patient presents for follow-up. We have been using Hydrofera Blue with antibiotic ointment under the total contact cast. He tolerated the cast well and has no issues or complaints today. 3/13; patient presents for follow-up. We have been using Hydrofera Blue with collagen to the left plantar foot wound and Hydrofera Blue with antibiotic ointment to the left toe wound all under the total contact cast. He had no issues or complaints today. Patient History Family History Heart Disease - Mother, Lung Disease - Mother. Social History Current every day smoker - 10 cig a day, Alcohol Use - Never, Drug Use - No History, Caffeine Use - Never. Medical History Hematologic/Lymphatic Patient has history of Lymphedema - pumps use daily Respiratory Patient has history of Sleep Apnea - CPAP Cardiovascular Patient has history of Hypertension, Peripheral Venous Disease - needs ablation on left leg Musculoskeletal Patient has history of Osteoarthritis Denies history of Osteomyelitis Neurologic Patient has history of Neuropathy Hospitalization/Surgery History - 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. Medical A Surgical History Notes nd Musculoskeletal abscess to left foot 3rd toe 11/28/2022 Objective Constitutional respirations regular, non-labored and within target range for patient.. Vitals Time Taken: 12:35 PM, Height: 75 in, Weight: 220 lbs, BMI: 27.5, Temperature: 98.6 F, Pulse: 75 bpm, Respiratory Rate: 16 breaths/min, Blood Pressure: 151/76 mmHg. Cardiovascular 2+ dorsalis pedis/posterior tibialis pulses. Psychiatric pleasant and cooperative. General Notes: Left foot: T the  plantar aspect of the great toe there is an open wound with granulation tissue, non viable tissue and surrounding callus. T the o o plantar aspect of the foot there is an open wound with granulation tissue, slough, pocket of increased depth in the center. No probing to bone. No signs of surrounding soft tissue infection to any of the wound beds. Integumentary (Hair, Skin) Wound #1 status is Open. Original cause of wound was Shear/Friction. The date acquired was: 01/28/2022. The wound has been in treatment 8 weeks. The wound is located on the Left T Great. The wound measures 0.1cm length x 0.1cm width x 0.1cm depth; 0.008cm^2 area and 0.001cm^3 volume. There is Fat Layer oe (Subcutaneous Tissue) exposed. There is no tunneling or undermining noted. There is a none present amount of drainage noted. The wound margin is distinct with the outline attached to the wound base. There is large (67-100%) pink granulation within the wound bed. There is no necrotic tissue within the wound bed. [name], [name] ([id number]) [id number] Page 7 of 10 The periwound skin appearance exhibited: Callus. The periwound skin appearance did not exhibit: Crepitus, Excoriation, Induration, Rash, Scarring, Dry/Scaly, Maceration, Atrophie Blanche, Cyanosis, Ecchymosis, Hemosiderin Staining, Mottled, Pallor, Rubor, Erythema. Periwound temperature was noted as No Abnormality. Wound #2  status is Open. Original cause of wound was Surgical Injury. The date acquired was: 01/14/2022. The wound has been in treatment 8 weeks. The wound is located on the Hand. The wound measures 0.2cm length x 0.2cm width x 0.2cm depth; 0.031cm^2 area and 0.006cm^3 volume. There is Fat Layer (Subcutaneous Tissue) exposed. There is no tunneling or undermining noted. There is a medium amount of serosanguineous drainage noted. The wound margin is distinct with the outline attached to the wound base. There is large  (67-100%) red, pink granulation within the wound bed. There is a small (1- 33%) amount of necrotic tissue within the wound bed including Adherent Slough. The periwound skin appearance exhibited: Callus. The periwound skin appearance did not exhibit: Crepitus, Excoriation, Induration, Rash, Scarring, Dry/Scaly, Maceration, Atrophie Blanche, Cyanosis, Ecchymosis, Hemosiderin Staining, Mottled, Pallor, Rubor, Erythema. 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 I debrided nonviable tissue. I recommended continuing the course with Hydrofera Blue and antibiotic ointment to the left great toe and collagen with antibiotic ointment and Hydrofera Blue to the plantar foot wound. TCC was placed in standard fashion. Follow-up in 1 week. 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 There was a Selective/Open Wound Skin/Epidermis oe Debridement with a total area of 0.25 sq cm performed by Kalman Shan, DO. With the following instrument([name]): Curette Material removed includes Callus and Skin: Epidermis and after achieving pain control using Lidocaine 4% Topical Solution. No specimens were taken. A time out was conducted at 12:55, prior to the start of the procedure. A Minimum amount of bleeding was controlled with Pressure. The procedure was tolerated well. Post Debridement Measurements: 0.1cm length x 0.1cm width x 0.1cm depth; 0.001cm^3 volume. Character of Wound/Ulcer Post Debridement is improved. Post procedure Diagnosis Wound #1: Same as Pre-Procedure General Notes: scribed for Dr. Heber [place] by Adline Peals, RN. Wound  #2 Pre-procedure diagnosis of Wound #2 is a Neuropathic Ulcer-Non Diabetic located on the [place] . There was a Excisional Skin/Subcutaneous Tissue Debridement with a total area of 0.25 sq cm performed by Kalman Shan, DO. With the following instrument([name]): Curette Material removed includes Subcutaneous Tissue and Slough and after achieving pain control using Lidocaine 4% T opical Solution. No specimens were taken. A time out was conducted at 12:55, prior to the start of the procedure. A Minimum amount of bleeding was controlled with Pressure. The procedure was tolerated well. Post Debridement Measurements: 0.5cm length x 0.5cm width x 0.2cm depth; 0.039cm^3 volume. Character of Wound/Ulcer Post Debridement is improved. Post procedure Diagnosis Wound #2: Same as Pre-Procedure General Notes: scribed for Dr. Heber [place] by Adline Peals, RN. Pre-procedure diagnosis of Wound #2 is a Neuropathic Ulcer-Non Diabetic located on the Left,Plantar Foot . There was a T Programmer, multimedia Procedure by Cephas Darby, Kohen Reither, DO. Post procedure Diagnosis Wound #2: Same as Pre-Procedure Notes: scribed for Dr. Heber [place] by Adline Peals, RN. Plan Follow-up Appointments: Return Appointment in 1 week. - Dr. Heber [place] and Allayne Butcher Rm # 9 Anesthetic: (In clinic) Topical Lidocaine 4% applied to wound bed Bathing/ Shower/ Hygiene: May shower with protection but do not get wound dressing([name]) wet. Protect dressing([name]) with water repellant cover (for example, large plastic bag) or a cast cover and may then take shower. Edema  Control - Lymphedema / SCD / Other: Elevate legs to the level of the heart or above for 30 minutes daily and/or when sitting for 3-4 times a day throughout the day. Avoid standing for long periods of time. Off-Loading: T Contact Cast to Left Lower Extremity - Size 4 otal The following medication([name]) was prescribed: lidocaine topical 4 % cream cream topical was prescribed at facility WOUND  #1: - T Great Wound Laterality: Left oe Cleanser: Wound Cleanser 1 x Per Week/30 Days Discharge Instructions: Cleanse the wound with wound cleanser prior to applying a clean dressing using gauze sponges, not tissue or cotton balls. Cleanser: [place] Ancillary Kit - 15 Day Supply (Generic) 1 x Per Week/30 Days Discharge Instructions: Use supplies as instructed; Kit contains: (15) Saline Bullets; (15) 3x3 Gauze; 15 pr Gloves Topical: Gentamicin 1 x Per Week/30 Days [name], [name] ([id number]) [phone].pdf Page 8 of 10 Discharge Instructions: As directed by physician Topical: Mupirocin Ointment 1 x Per Week/30 Days Discharge Instructions: Apply Mupirocin (Bactroban) as instructed Prim Dressing: Hydrofera Blue Ready Transfer Foam, 2.5x2.5 (in/in) (Generic) 1 x Per Week/30 Days ary Discharge Instructions: Apply directly to wound bed as directed Secondary Dressing: Optifoam Non-Adhesive Dressing, 4x4 in (Generic) 1 x Per Week/30 Days Discharge Instructions: Apply x1 layer donut to aid in offloading. Secondary Dressing: Woven Gauze Sponges 2x2 in (Generic) 1 x Per Week/30 Days Discharge Instructions: Apply over primary dressing as directed. Secured With: Child psychotherapist, Sterile 2x75 (in/in) (Generic) 1 x Per Week/30 Days Discharge Instructions: Secure with stretch gauze as directed. Secured With: 100M Medipore H Soft Cloth Surgical T ape, 4 x 10 (in/yd) (Generic) 1 x Per Week/30 Days Discharge Instructions: Secure with tape as directed. WOUND #2: - Foot Wound Laterality: Plantar, Left Cleanser: Wound Cleanser 1 x Per Week/30 Days Discharge Instructions: Cleanse the wound with wound cleanser prior to applying a clean dressing using gauze sponges, not tissue or cotton balls. Cleanser: Byram Ancillary Kit - 15 Day Supply (Generic) 1 x Per Week/30 Days Discharge Instructions: Use supplies as instructed; Kit contains: (15) Saline Bullets; (15) 3x3 Gauze; 15  pr Gloves Topical: Gentamicin 1 x Per Week/30 Days Discharge Instructions: As directed by physician Topical: Mupirocin Ointment 1 x Per Week/30 Days Discharge Instructions: Apply Mupirocin (Bactroban) as instructed Prim Dressing: Promogran Prisma Matrix, 4.34 (sq in) (silver collagen) 1 x Per Week/30 Days ary Discharge Instructions: Moisten collagen with saline or hydrogel Prim Dressing: Hydrofera Blue Ready Transfer Foam, 2.5x2.5 (in/in) (Generic) 1 x Per Week/30 Days ary Discharge Instructions: Apply directly to wound bed as directed Secondary Dressing: Optifoam Non-Adhesive Dressing, 4x4 in (Generic) 1 x Per Week/30 Days Discharge Instructions: Apply x1 layer donut to aid in offloading. Secondary Dressing: Woven Gauze Sponges 2x2 in (Generic) 1 x Per Week/30 Days Discharge Instructions: Apply over primary dressing as directed. Secured With: Child psychotherapist, Sterile 2x75 (in/in) (Generic) 1 x Per Week/30 Days Discharge Instructions: Secure with stretch gauze as directed. Secured With: 100M Medipore H Soft Cloth Surgical T ape, 4 x 10 (in/yd) (Generic) 1 x Per Week/30 Days Discharge Instructions: Secure with tape as directed. 1. In office sharp debridement 2. Hydrofera Blue 3. Antibiotic ointment 4. Collagen 5. T contact cast placed in standard fashion otal Electronic Signature([name]) Signed: 04/10/2022 3:34:03 PM By: Kalman Shan DO Entered By: Kalman Shan on 04/09/2022 13:50:03 -------------------------------------------------------------------------------- HxROS Details Patient Name: Date of Service: John Kelp. 04/09/2022 12:30 PM Medical Record Number: [id number] Patient Account Number: [id number] Date of Birth/Sex: Treating  RN: Jun 18, 1965 (57 y.o. M) Primary Care Provider: Merrilee [name] Other Clinician: Referring Provider: Treating Provider/Extender: [name] in Treatment: 8 Hematologic/Lymphatic Medical  History: Positive for: Lymphedema - pumps use daily Respiratory Medical History: Positive for: Sleep Apnea - CPAP Cardiovascular Medical History: Positive for: Hypertension; Peripheral Venous Disease - needs ablation on left leg Musculoskeletal Medical History: Positive for: Osteoarthritis Negative for: Osteomyelitis Past Medical History Notes: abscess to left foot 3rd toe 11/28/2022 [name], [name] [name] ([id number]) [id number] Page 9 of 10 Neurologic Medical History: Positive for: Neuropathy Immunizations Implantable Devices No devices added Hospitalization / Surgery History Type of Hospitalization/Surgery 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 Family and Social History Heart Disease: Yes - Mother; Lung Disease: Yes - Mother; Current every day smoker - 10 cig a day; Alcohol Use: Never; Drug Use: No History; Caffeine Use: Never; Financial Concerns: No; Food, Clothing or Shelter Needs: No; Support System Lacking: No; Transportation Concerns: No Electronic Signature([name]) Signed: 04/10/2022 3:34:03 PM By: Kalman Shan DO Entered By: Kalman Shan on 04/09/2022 13:47:10 -------------------------------------------------------------------------------- Total Contact Cast Details Patient Name: Date of Service: RONDEL, KEATING 04/09/2022 12:30 PM Medical Record Number: [id number] Patient Account Number: [id number] Date of Birth/Sex: Treating RN: Mar 12, 1965 (57 y.o. [name] Primary Care Provider: Merrilee [name] Other Clinician: Referring Provider: Treating Provider/Extender: [name] in Treatment: 8 T Contact Cast Applied for Wound Assessment: otal Wound #2 Left,Plantar Foot Performed By: Physician Kalman Shan, DO Post Procedure Diagnosis Same as Pre-procedure Notes scribed for Dr. Heber  by Adline Peals, RN Electronic Signature([name]) Signed: 04/09/2022 3:46:51 PM By: Adline Peals Signed: 04/10/2022 3:34:03 PM By: Kalman Shan DO Entered By: Adline Peals on 04/09/2022 12:57:41 -------------------------------------------------------------------------------- [place] Details Patient Name: Date of Service: John Kelp 04/09/2022 Medical Record Number: [id number] Patient Account Number: [id number] Date of Birth/Sex: Treating RN: 1966/01/10 (57 y.o. 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 G47.33 Obstructive sleep apnea (adult) (pediatric) I87.2 Venous insufficiency (chronic) (peripheral) Facility Procedures JESUS, LAPENTA ([id number]): CPT4 Code Description [id number] - DEB SUBQ TISSUE 20 SQ CM/< ICD-10 Diagnosis Description L97.522 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.09 Other idiopathic peripheral autonomic neuropathy [id number] Page 10 of 10: Modifier Quantity 1 layer exposed MICHAELVINCENT, WILLE [name] ([id number]): [id number] - DEBRIDE WOUND 1ST 20 SQ CM OR < ICD-10 Diagnosis Description L97.522 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.09 Other idiopathic peripheral autonomic neuropathy [id number] Page 10 of 10: 1 layer exposed Physician Procedures : CPT4 Code Description Modifier [id number] - WC PHYS SUBQ TISS 20 SQ CM ICD-10 Diagnosis Description 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

## 2022-04-15 ENCOUNTER — Encounter (HOSPITAL_BASED_OUTPATIENT_CLINIC_OR_DEPARTMENT_OTHER): Payer: BC Managed Care – PPO | Admitting: Internal Medicine

## 2022-04-15 DIAGNOSIS — L97522 Non-pressure chronic ulcer of other part of 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: Secondary | ICD-10-CM | POA: Diagnosis not present

## 2022-04-15 DIAGNOSIS — L02612 Cutaneous abscess of left foot: Secondary | ICD-10-CM | POA: Diagnosis not present

## 2022-04-15 DIAGNOSIS — G4733 Obstructive sleep apnea (adult) (pediatric): Secondary | ICD-10-CM | POA: Diagnosis not present

## 2022-04-15 DIAGNOSIS — F17210 Nicotine dependence, cigarettes, uncomplicated: Secondary | ICD-10-CM | POA: Diagnosis not present

## 2022-04-15 DIAGNOSIS — I872 Venous insufficiency (chronic) (peripheral): Secondary | ICD-10-CM | POA: Diagnosis not present

## 2022-04-15 DIAGNOSIS — M1990 Unspecified osteoarthritis, unspecified site: Secondary | ICD-10-CM | POA: Diagnosis not present

## 2022-04-15 DIAGNOSIS — I10 Essential (primary) hypertension: Secondary | ICD-10-CM | POA: Diagnosis not present

## 2022-04-15 DIAGNOSIS — I890 Lymphedema, not elsewhere classified: Secondary | ICD-10-CM | POA: Diagnosis not present

## 2022-04-15 DIAGNOSIS — G9009 Other idiopathic peripheral autonomic neuropathy: Secondary | ICD-10-CM

## 2022-04-19 NOTE — Progress Notes (Signed)
SWAY, HOGLUND ([id number]) [id number] Page 1 of 8 Visit Report for 04/15/2022 Chief Complaint Document Details Patient Name: Date of Service: [name], [name] 04/15/2022 9:30 A M Medical Record Number: [id number] Patient Account Number: [id number] Date of Birth/Sex: Treating RN: 01/31/1965 (57 y.o. M) Primary Care Provider: [name] Other Clinician: Referring Provider: Treating Provider/Extender: [name] in Treatment: 9 Information Obtained from: Patient Chief Complaint 02/11/2022 left great toe wound and fourth sub-met head wound Electronic Signature(s) Signed: 04/15/2022 5:03:25 PM By: Kalman Shan DO Entered By: Kalman Shan on 04/15/2022 12:22:42 -------------------------------------------------------------------------------- Debridement Details Patient Name: Date of Service: [name]. 04/15/2022 9:30 A M Medical Record Number: [id number] Patient Account Number: [id number] Date of Birth/Sex: Treating RN: 10-06-1965 (57 y.o. [name], [name] Primary Care Provider: [name] Other Clinician: Referring Provider: Treating Provider/Extender: [name] in Treatment: 9 Debridement Performed for Assessment: Wound #2 Left,Plantar Foot Performed By: Physician Kalman Shan, DO Debridement Type: Debridement Level of Consciousness (Pre-procedure): Awake and Alert Pre-procedure Verification/Time Out Yes - 10:34 Taken: Start Time: 10:34 Pain Control: Lidocaine T Area Debrided (L x W): otal 1 (cm) x 0.3 (cm) = 0.3 (cm) Tissue and other material debrided: Viable, Non-Viable, Slough, Subcutaneous, Slough Level: Skin/Subcutaneous Tissue Debridement Description: Excisional Instrument: Curette Bleeding: Minimum Hemostasis Achieved: Pressure End Time: 10:34 Procedural Pain: 0 Post Procedural Pain: 0 Response to Treatment: Procedure was tolerated  well Level of Consciousness (Post- Awake and Alert procedure): Post Debridement Measurements of Total Wound Length: (cm) 1 Width: (cm) 0.3 Depth: (cm) 0.6 Volume: (cm) 0.141 Character of Wound/Ulcer Post Debridement: Improved Post Procedure Diagnosis Same as Pre-procedure Electronic Signature(s) Signed: 04/15/2022 5:03:25 PM By: Kalman Shan DO Signed: 04/18/2022 11:12:19 AM By: Rhae Hammock RN Entered By: Rhae Hammock on 04/15/2022 10:34:37 Barrientes, Nima S ([id number]) [id number] Page 2 of 8 -------------------------------------------------------------------------------- HPI Details Patient Name: Date of Service: [name] 04/15/2022 9:30 A M Medical Record Number: [id number] Patient Account Number: [id number] Date of Birth/Sex: Treating RN: 02/12/1965 (57 y.o. M) Primary Care Provider: [name] Other Clinician: Referring Provider: Treating Provider/Extender: [name] in Treatment: 9 History of Present Illness HPI Description: 02/11/2022 [name] is a 57 year old male with a past medical history of current tobacco user, OSA, venous insufficiency and peripheral neuropathy that presents the clinic for a 2-week history of ulcer to the left great toe and 2-38-month history of nonhealing ulcer to the left plantar foot. He works in a [place] and is on his feet all day. He wears steel toed boots. He is currently out of work due to his wounds. On 11/26/2021 he was admitted to the hospital for cellulitis and abscess to the left foot. 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 He states he has been on doxycycline for 10 weeks. He just completed this course. He uses regular tennis shoes for walking. He is not offloading the area. He is using Betadine for wound dressings. He has been following with Dr. Posey Pronto, podiatry who Referred to our clinic. 1/23;  patient presents for follow-up. He has been using Medihoney and Hydrofera Blue to the wound beds Along with his surgical shoe and offloading peg assist. He has no issues or complaints today. He denies signs of infection. 1/30; patient presents for follow-up. He has been using Medihoney and Hydrofera Blue to the wound beds. He has no issues or complaints today. Plan is for the total contact cast and patient is agreeable to move  forward with this. 2/1; patient presents for follow-up. We have been using antibiotic ointment with Hydrofera Blue to the wound beds under the total contact cast. He presents today for his obligatory cast change. He had no issues with the cast. 2/6; patient presents for follow-up. We have been using antibiotic ointment with Hydrofera Blue to the wound beds under the total contact cast. He has no issues or complaints today. He has tolerated the cast well. 2/13; patient presents for follow-up. We have been using the total contact cast to the left lower extremity. We have been using Hydrofera Blue and antibiotic ointment to the wound beds. He has no issues or complaints today. 2/20; patient presents for follow-up. We were using the total contact cast along with Hydrofera Blue and antibiotic ointment. He states the cast was slightly uncomfortable over the past week. 2/27; Patient presents for follow-up. We have been using Hydrofera Blue with antibiotic ointment under the total contact cast. He tolerated the cast well and has no issues or complaints today. 3/5; Patient presents for follow-up. We have been using Hydrofera Blue with antibiotic ointment under the total contact cast. He tolerated the cast well and has no issues or complaints today. 3/13; patient presents for follow-up. We have been using Hydrofera Blue with collagen to the left plantar foot wound and Hydrofera Blue with antibiotic ointment to the left toe wound all under the total contact cast. He had no issues or  complaints today. 3/19; patient presents for follow-up. We have been using Hydrofera Blue with collagen to the left plantar foot wound and antibiotic ointment with Hydrofera Blue to the left great toe wound. All under the total contact cast. The toe wound is healed. Electronic Signature(s) Signed: 04/15/2022 5:03:25 PM By: Kalman Shan DO Entered By: Kalman Shan on 04/15/2022 12:23:28 -------------------------------------------------------------------------------- Physical Exam Details Patient Name: Date of Service: JARRIN, PAETH 04/15/2022 9:30 A M Medical Record Number: [id number] Patient Account Number: [id number] Date of Birth/Sex: Treating RN: 03-08-1965 (57 y.o. M) Primary Care Provider: [name] Other Clinician: Referring Provider: Treating Provider/Extender: [name] in Treatment: 9 Constitutional respirations regular, non-labored and within target range for patient.. Cardiovascular 2+ dorsalis pedis/posterior tibialis pulses. Psychiatric pleasant and cooperative. Notes Left foot: T the plantar aspect of the great toe There is epithelization to the previous wound site. T the plantar aspect of the foot there is an open wound with o o granulation tissue, slough, pocket of increased depth in the center. No probing to bone. No signs of surrounding soft tissue infection to any of the wound beds. JOSES, GALLE ([id number]) [id number] Page 3 of 8 Electronic Signature(s) Signed: 04/15/2022 5:03:25 PM By: Kalman Shan DO Entered By: Kalman Shan on 04/15/2022 12:24:03 -------------------------------------------------------------------------------- Physician Orders Details Patient Name: Date of Service: [name]. 04/15/2022 9:30 A M Medical Record Number: [id number] Patient Account Number: [id number] Date of Birth/Sex: Treating RN: [birth year] (57 y.o. [name], [name] Primary  Care Provider: [name] Other Clinician: Referring Provider: Treating Provider/Extender: [name] in Treatment: 9 Verbal / Phone Orders: No Diagnosis Coding Follow-up Appointments ppointment in 1 week. - Dr. Heber Mitchell and Allayne Butcher Rm # 9 Tuesday 04/22/22 @ 11:00 (already has appt.) Return A Anesthetic (In clinic) Topical Lidocaine 4% applied to wound bed Bathing/ Shower/ Hygiene May shower with protection but do not get wound dressing(s) wet. Protect dressing(s) with water repellant cover (for example, large plastic bag) or a cast cover and may then take shower. Edema Control -  Lymphedema / SCD / Other Elevate legs to the level of the heart or above for 30 minutes daily and/or when sitting for 3-4 times a day throughout the day. Avoid standing for long periods of time. Off-Loading Total Contact Cast to Left Lower Extremity - Size 4 Wound Treatment Wound #2 - Foot Wound Laterality: Plantar, Left Cleanser: Wound Cleanser 1 x Per Week/30 Days Discharge Instructions: Cleanse the wound with wound cleanser prior to applying a clean dressing using gauze sponges, not tissue or cotton balls. Cleanser: Byram Ancillary Kit - 15 Day Supply (Generic) 1 x Per Week/30 Days Discharge Instructions: Use supplies as instructed; Kit contains: (15) Saline Bullets; (15) 3x3 Gauze; 15 pr Gloves Topical: Gentamicin 1 x Per Week/30 Days Discharge Instructions: As directed by physician Topical: Mupirocin Ointment 1 x Per Week/30 Days Discharge Instructions: Apply Mupirocin (Bactroban) as instructed Prim Dressing: Promogran Prisma Matrix, 4.34 (sq in) (silver collagen) 1 x Per Week/30 Days ary Discharge Instructions: Moisten collagen with saline or hydrogel Prim Dressing: Hydrofera Blue Ready Transfer Foam, 2.5x2.5 (in/in) (Generic) 1 x Per Week/30 Days ary Discharge Instructions: Apply directly to wound bed as directed Secondary Dressing: Optifoam Non-Adhesive  Dressing, 4x4 in (Generic) 1 x Per Week/30 Days Discharge Instructions: Apply x1 layer donut to aid in offloading. Secondary Dressing: Woven Gauze Sponges 2x2 in (Generic) 1 x Per Week/30 Days Discharge Instructions: Apply over primary dressing as directed. Secured With: Child psychotherapist, Sterile 2x75 (in/in) (Generic) 1 x Per Week/30 Days Discharge Instructions: Secure with stretch gauze as directed. Secured With: 77M Medipore H Soft Cloth Surgical T ape, 4 x 10 (in/yd) (Generic) 1 x Per Week/30 Days Discharge Instructions: Secure with tape as directed. Electronic Signature(s) Signed: 04/15/2022 5:03:25 PM By: Kalman Shan DO Entered By: Kalman Shan on 04/15/2022 12:24:11 Bremner, Antionette Char ([id number]) [id number] Page 4 of 8 -------------------------------------------------------------------------------- Problem List Details Patient Name: Date of Service: RAMAL, MCKETHAN 04/15/2022 9:30 A M Medical Record Number: [id number] Patient Account Number: [id number] Date of Birth/Sex: Treating RN: 23-Dec-1965 (57 y.o. M) Primary Care Provider: [name] Other Clinician: Referring Provider: Treating Provider/Extender: [name] in Treatment: 9 Active Problems ICD-10 Encounter Code Description Active Date MDM Diagnosis L97.522 Non-pressure chronic ulcer of other part of 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 02/11/2022 No Yes G90.09 Other idiopathic peripheral autonomic neuropathy 02/11/2022 No Yes G47.33 Obstructive sleep apnea (adult) (pediatric) 02/11/2022 No Yes I87.2 Venous insufficiency (chronic) (peripheral) 02/11/2022 No Yes Inactive Problems Resolved Problems Electronic Signature(s) Signed: 04/15/2022 5:03:25 PM By: Kalman Shan DO Entered By: Kalman Shan on 04/15/2022 12:22:24 -------------------------------------------------------------------------------- Progress Note Details Patient  Name: Date of Service: [name]. 04/15/2022 9:30 A M Medical Record Number: [id number] Patient Account Number: [id number] Date of Birth/Sex: Treating RN: [birth year] (57 y.o. M) Primary Care Provider: [name] Other Clinician: Referring Provider: Treating Provider/Extender: [name] in Treatment: 9 Subjective Chief Complaint Information obtained from Patient 02/11/2022 left great toe wound and fourth sub-met head wound History of Present Illness (HPI) 02/11/2022 Mr. Rachael Schickling is a 58 year old male with a past medical history of current tobacco user, OSA, venous insufficiency and peripheral neuropathy that presents the clinic for a 2-week history of ulcer to the left great toe and 2-2-month history of nonhealing ulcer to the left plantar foot. He works in a [employer] and is on his feet all day. He wears steel toed boots. He is currently out of work due to his wounds. On 11/26/2021 he was admitted  to the hospital for cellulitis and abscess to the left foot. 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 He states he has been on doxycycline for 10 weeks. He just completed this course. He uses regular tennis shoes for walking. He is not offloading the area. He is using Betadine for wound dressings. He has been following with Dr. Posey Pronto, podiatry who Referred to our clinic. 1/23; patient presents for follow-up. He has been using Medihoney and Hydrofera Blue to the wound beds Along with his surgical shoe and offloading peg assist. He has no issues or complaints today. He denies signs of infection. 1/30; patient presents for follow-up. He has been using Medihoney and Hydrofera Blue to the wound beds. He has no issues or complaints today. Plan is for the total contact cast and patient is agreeable to move forward with this. 2/1; patient presents for follow-up. We have been using antibiotic ointment with Hydrofera Blue  to the wound beds under the total contact cast. He presents today for his obligatory cast change. He had no issues with the cast. JECORY, VOCE S ([id number]) [id number] Page 5 of 8 2/6; patient presents for follow-up. We have been using antibiotic ointment with Hydrofera Blue to the wound beds under the total contact cast. He has no issues or complaints today. He has tolerated the cast well. 2/13; patient presents for follow-up. We have been using the total contact cast to the left lower extremity. We have been using Hydrofera Blue and antibiotic ointment to the wound beds. He has no issues or complaints today. 2/20; patient presents for follow-up. We were using the total contact cast along with Hydrofera Blue and antibiotic ointment. He states the cast was slightly uncomfortable over the past week. 2/27; Patient presents for follow-up. We have been using Hydrofera Blue with antibiotic ointment under the total contact cast. He tolerated the cast well and has no issues or complaints today. 3/5; Patient presents for follow-up. We have been using Hydrofera Blue with antibiotic ointment under the total contact cast. He tolerated the cast well and has no issues or complaints today. 3/13; patient presents for follow-up. We have been using Hydrofera Blue with collagen to the left plantar foot wound and Hydrofera Blue with antibiotic ointment to the left toe wound all under the total contact cast. He had no issues or complaints today. 3/19; patient presents for follow-up. We have been using Hydrofera Blue with collagen to the left plantar foot wound and antibiotic ointment with Hydrofera Blue to the left great toe wound. All under the total contact cast. The toe wound is healed. Patient History Family History Heart Disease - Mother, Lung Disease - Mother. Social History Current every day smoker - 10 cig a day, Alcohol Use - Never, Drug Use - No History, Caffeine Use -  Never. Medical History Hematologic/Lymphatic Patient has history of Lymphedema - pumps use daily Respiratory Patient has history of Sleep Apnea - CPAP Cardiovascular Patient has history of Hypertension, Peripheral Venous Disease - needs ablation on left leg Musculoskeletal Patient has history of Osteoarthritis Denies history of Osteomyelitis Neurologic Patient has history of Neuropathy Hospitalization/Surgery History - 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. Medical A Surgical History Notes nd Musculoskeletal abscess to left foot 3rd toe 11/28/2022 Objective Constitutional respirations regular, non-labored and within target range for patient.. Vitals Time Taken: 9:54 AM, Height: 75 in, Weight: 220 lbs, BMI: 27.5, Temperature: 98.6 F, Pulse: 67 bpm, Respiratory Rate: 17 breaths/min, Blood Pressure: 151/92 mmHg. Cardiovascular 2+ dorsalis  pedis/posterior tibialis pulses. Psychiatric pleasant and cooperative. General Notes: Left foot: T the plantar aspect of the great toe There is epithelization to the previous wound site. T the plantar aspect of the foot there is an o o open wound with granulation tissue, slough, pocket of increased depth in the center. No probing to bone. No signs of surrounding soft tissue infection to any of the wound beds. Integumentary (Hair, Skin) Wound #1 status is Healed - Epithelialized. Original cause of wound was Shear/Friction. The date acquired was: 01/28/2022. The wound has been in treatment 9 weeks. The wound is located on the Left T Great. The wound measures 0cm length x 0cm width x 0cm depth; 0cm^2 area and 0cm^3 volume. There is Fat oe Layer (Subcutaneous Tissue) exposed. There is no tunneling or undermining noted. There is a none present amount of drainage noted. The wound margin is distinct with the outline attached to the wound base. There is large (67-100%) pink granulation within the wound bed. There is no necrotic tissue within the  wound bed. The periwound skin appearance exhibited: Callus. The periwound skin appearance did not exhibit: Crepitus, Excoriation, Induration, Rash, Scarring, Dry/Scaly, Maceration, Atrophie Blanche, Cyanosis, Ecchymosis, Hemosiderin Staining, Mottled, Pallor, Rubor, Erythema. Periwound temperature was noted as No Abnormality. Wound #2 status is Open. Original cause of wound was Surgical Injury. The date acquired was: 01/14/2022. The wound has been in treatment 9 weeks. The wound is located on the [place]. The wound measures 1cm length x 0.3cm width x 0.5cm depth; 0.236cm^2 area and 0.118cm^3 volume. There is Fat Layer (Subcutaneous Tissue) exposed. There is no tunneling or undermining noted. There is a medium amount of serosanguineous drainage noted. The wound margin is distinct with the outline attached to the wound base. There is large (67-100%) red, pink granulation within the wound bed. There is a small (1-33%Jerauld Domagalski, Luay S ([id number]) [id number] Page 6 of 8 amount of necrotic tissue within the wound bed. The periwound skin appearance exhibited: Callus. The periwound skin appearance did not exhibit: Crepitus, Excoriation, Induration, Rash, Scarring, Dry/Scaly, Maceration, Atrophie Blanche, Cyanosis, Ecchymosis, Hemosiderin Staining, Mottled, Pallor, Rubor, Erythema. 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 The plantar foot wound appears well-healing. I debrided nonviable tissue. I recommended continuing the course with collagen and Hydrofera Blue under the total contact cast. Follow-up in 1 week. Procedures Wound #2 Pre-procedure diagnosis of Wound #2 is a Neuropathic Ulcer-Non Diabetic located on the  Left,Plantar Foot . There was a Excisional Skin/Subcutaneous Tissue Debridement with a total area of 0.3 sq cm performed by Kalman Shan, DO. With the following instrument(s): Curette to remove Viable and Non-Viable tissue/material. Material removed includes Subcutaneous Tissue and Slough and after achieving pain control using Lidocaine. No specimens were taken. A time out was conducted at 10:34, prior to the start of the procedure. A Minimum amount of bleeding was controlled with Pressure. The procedure was tolerated well with a pain level of 0 throughout and a pain level of 0 following the procedure. Post Debridement Measurements: 1cm length x 0.3cm width x 0.6cm depth; 0.141cm^3 volume. Character of Wound/Ulcer Post Debridement is improved. Post procedure Diagnosis Wound #2: Same as Pre-Procedure Pre-procedure diagnosis of Wound #2 is a Neuropathic Ulcer-Non Diabetic located on the Left,Plantar Foot . There was a T Programmer, multimedia Procedure by Cephas Darby, Linn Goetze, DO.  Post procedure Diagnosis Wound #2: Same as Pre-Procedure Plan Follow-up Appointments: Return Appointment in 1 week. - Dr. Heber Bevier and Allayne Butcher Rm # 9 Tuesday 04/22/22 @ 11:00 (already has appt.) Anesthetic: (In clinic) Topical Lidocaine 4% applied to wound bed Bathing/ Shower/ Hygiene: May shower with protection but do not get wound dressing(s) wet. Protect dressing(s) with water repellant cover (for example, large plastic bag) or a cast cover and may then take shower. Edema Control - Lymphedema / SCD / Other: Elevate legs to the level of the heart or above for 30 minutes daily and/or when sitting for 3-4 times a day throughout the day. Avoid standing for long periods of time. Off-Loading: T Contact Cast to Left Lower Extremity - Size 4 otal WOUND #2: - Foot Wound Laterality: Plantar, Left Cleanser: Wound Cleanser 1 x Per Week/30 Days Discharge Instructions: Cleanse the wound with wound cleanser prior to applying a clean  dressing using gauze sponges, not tissue or cotton balls. Cleanser: Byram Ancillary Kit - 15 Day Supply (Generic) 1 x Per Week/30 Days Discharge Instructions: Use supplies as instructed; Kit contains: (15) Saline Bullets; (15) 3x3 Gauze; 15 pr Gloves Topical: Gentamicin 1 x Per Week/30 Days Discharge Instructions: As directed by physician Topical: Mupirocin Ointment 1 x Per Week/30 Days Discharge Instructions: Apply Mupirocin (Bactroban) as instructed Prim Dressing: Promogran Prisma Matrix, 4.34 (sq in) (silver collagen) 1 x Per Week/30 Days ary Discharge Instructions: Moisten collagen with saline or hydrogel Prim Dressing: Hydrofera Blue Ready Transfer Foam, 2.5x2.5 (in/in) (Generic) 1 x Per Week/30 Days ary Discharge Instructions: Apply directly to wound bed as directed Secondary Dressing: Optifoam Non-Adhesive Dressing, 4x4 in (Generic) 1 x Per Week/30 Days Discharge Instructions: Apply x1 layer donut to aid in offloading. Secondary Dressing: Woven Gauze Sponges 2x2 in (Generic) 1 x Per Week/30 Days Discharge Instructions: Apply over primary dressing as directed. Secured With: Child psychotherapist, Sterile 2x75 (in/in) (Generic) 1 x Per Week/30 Days Discharge Instructions: Secure with stretch gauze as directed. Secured With: 52M Medipore H Soft Cloth Surgical T ape, 4 x 10 (in/yd) (Generic) 1 x Per Week/30 Days Discharge Instructions: Secure with tape as directed. 1. In office sharp debridement 2. [address] and collagen GALDINO, TWISDALE S ([id number]) [id number] Page 7 of 8 3. T contact cast placed in standard fashion otal 4. Follow-up in 1 week Electronic Signature(s) Signed: 04/15/2022 5:03:25 PM By: Kalman Shan DO Entered By: Kalman Shan on 04/15/2022 12:24:52 -------------------------------------------------------------------------------- HxROS Details Patient Name: Date of Service: [name]. 04/15/2022 9:30 A  M Medical Record Number: [id number] Patient Account Number: [id number] Date of Birth/Sex: Treating RN: 09/06/1965 (57 y.o. M) Primary Care Provider: [name] Other Clinician: Referring Provider: Treating Provider/Extender: [name] in Treatment: 9 Hematologic/Lymphatic Medical History: Positive for: Lymphedema - pumps use daily Respiratory Medical History: Positive for: Sleep Apnea - CPAP Cardiovascular Medical History: Positive for: Hypertension; Peripheral Venous Disease - needs ablation on left leg Musculoskeletal Medical History: Positive for: Osteoarthritis Negative for: Osteomyelitis Past Medical History Notes: abscess to left foot 3rd toe 11/28/2022 Neurologic Medical History: Positive for: Neuropathy Immunizations Implantable Devices No devices added Hospitalization / Surgery History Type of Hospitalization/Surgery 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 Family and Social History Heart Disease: Yes - Mother; Lung Disease: Yes - Mother; Current every day smoker - 10 cig a day; Alcohol Use: Never; Drug Use: No History; Caffeine Use: Never; Financial Concerns: No; Food, Clothing or Shelter Needs: No; Support System Lacking: No; Transportation Concerns: No Electronic  Signature(s) Signed: 04/15/2022 5:03:25 PM By: Kalman Shan DO Entered By: Kalman Shan on 04/15/2022 12:23:35 -------------------------------------------------------------------------------- Total Contact Cast Details Patient Name: Date of Service: RAAM, LINDY 04/15/2022 9:30 A M Medical Record Number: [id number] Patient Account Number: [id number] Date of Birth/Sex: Treating RN: 05-21-1965 (57 y.o. Khadin, Bresette, Antionette Char ([id number]) [id number] Page 8 of 8 Primary Care Provider: [name] Other Clinician: Referring Provider: Treating Provider/Extender: [name] in Treatment: 9 T Contact Cast Applied for Wound Assessment: otal Wound #2 Left,Plantar Foot Performed By: Physician Kalman Shan, DO Post Procedure Diagnosis Same as Pre-procedure Electronic Signature(s) Signed: 04/15/2022 5:03:25 PM By: Kalman Shan DO Signed: 04/18/2022 11:12:19 AM By: Rhae Hammock RN Entered By: Rhae Hammock on 04/15/2022 10:42:19 -------------------------------------------------------------------------------- SuperBill Details Patient Name: Date of Service: [name] 04/15/2022 Medical Record Number: [id number] Patient Account Number: [id number] Date of Birth/Sex: Treating RN: [birth year] (57 y.o. 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

## 2022-04-21 DIAGNOSIS — F1120 Opioid dependence, uncomplicated: Secondary | ICD-10-CM | POA: Diagnosis not present

## 2022-04-21 NOTE — Progress Notes (Signed)
[name], John S ([id number]) [id number] Page 1 of 9 Visit Report for 04/15/2022 Arrival Information Details Patient Name: Date of Service: [name], BELFIELD 04/15/2022 9:30 A M Medical Record Number: [id number] Patient Account Number: [id number] Date of Birth/Sex: Treating RN: 09/13/1965 (57 y.o. M) Primary Care Kolton Kienle: [name] Other Clinician: Referring Michaelangelo Mittelman: Treating Tyona Nilsen/Extender: [name] in Treatment: 9 Visit Information History Since Last Visit Added or deleted any medications: No Patient Arrived: Ambulatory Any new allergies or adverse reactions: No Arrival Time: 09:53 Had a fall or experienced change in No Accompanied By: self activities of daily living that may affect Transfer Assistance: None risk of falls: Patient Identification Verified: Yes Signs or symptoms of abuse/neglect since last No Secondary Verification Process Completed: Yes visito Patient Requires Transmission-Based No Hospitalized since last visit: No Precautions: Implantable device outside of the clinic No Patient Has Alerts: Yes excluding Patient Alerts: 11/27/21 ABI L 1.26 R1.09 cellular tissue based products placed in the 11/27/2021 TBI L0.72 center R0.66 since last visit: Has Dressing in Place as Prescribed: Yes Has Footwear/Offloading in Place as Yes Prescribed: Left: Removable Cast Walker/Walking Boot T Contact Cast otal Pain Present Now: No Electronic Signature(s) Signed: 04/21/2022 4:16:21 PM By: Erenest Blank Entered By: Erenest Blank on 04/15/2022 09:54:25 -------------------------------------------------------------------------------- Encounter Discharge Information Details Patient Name: Date of Service: [name]. 04/15/2022 9:30 A M Medical Record Number: [id number] Patient Account Number: [id number] Date of Birth/Sex: Treating RN: 08-09-1965 (57 y.o. [name], [name] Primary Care  Stavroula Rohde: [name] Other Clinician: Referring [name]: Treating [name]/Extender: [name] in Treatment: 9 Encounter Discharge Information Items Post Procedure Vitals Discharge Condition: Stable Temperature (F): 98.7 Ambulatory Status: Ambulatory Pulse (bpm): 74 Discharge Destination: Home Respiratory Rate (breaths/min): 17 Transportation: Private Auto Blood Pressure (mmHg): 120/80 Accompanied By: self Schedule Follow-up Appointment: Yes Clinical Summary of Care: Patient Declined Electronic Signature(s) Signed: 04/18/2022 11:12:19 AM By: Rhae Hammock RN Entered By: Rhae Hammock on 04/15/2022 10:45:26 -------------------------------------------------------------------------------- Lower Extremity Assessment Details Patient Name: Date of Service: [name]. 04/15/2022 9:30 A M Medical Record Number: [id number] Patient Account Number: [id number] Date of Birth/Sex: Treating RN: 1965/07/19 (57 y.o. M) Primary Care Anani Gu: [name] Other Clinician: JAIMIR, SOU ([id number]) [id number] Page 2 of 9 Referring [name]: Treating [name]/Extender: Ronnald Nian, Ajith Weeks in Treatment: 9 Edema Assessment Assessed: [Left: No] [Right: No] Edema: [Left: Ye] [Right: s] Calf Left: Right: Point of Measurement: 43 cm From Medial Instep 42 cm Ankle Left: Right: Point of Measurement: 11 cm From Medial Instep 27 cm Electronic Signature(s) Signed: 04/21/2022 4:16:21 PM By: Erenest Blank Entered By: Erenest Blank on 04/15/2022 10:11:06 -------------------------------------------------------------------------------- Multi Wound Chart Details Patient Name: Date of Service: [name]. 04/15/2022 9:30 A M Medical Record Number: [id number] Patient Account Number: [id number] Date of Birth/Sex: Treating RN: 01/21/1966 (57 y.o. 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 N/A Primary Etiology: Lymphedema, Sleep Apnea, Lymphedema, Sleep Apnea, N/A Comorbid History: Hypertension, Peripheral Venous Hypertension, Peripheral Venous Disease, Osteoarthritis, Neuropathy Disease, Osteoarthritis, Neuropathy 01/28/2022 01/14/2022 N/A Date Acquired: 9 9 N/A Weeks of Treatment: Healed - Epithelialized Open N/A Wound Status: No No N/A Wound Recurrence: Yes Yes  N/A Pending A mputation on Presentation: 0x0x0 1x0.3x0.5 N/A Measurements L x W x D (cm) 0 0.236 N/A A (cm) : rea 0 0.118 N/A Volume (cm) : 100.00% -100.00% N/A % Reduction in A rea: 100.00% -391.70% N/A % Reduction in Volume: Full Thickness Without Exposed Full Thickness Without Exposed N/A Classification: Support Structures Support Structures None Present Medium N/A Exudate Amount: N/A Serosanguineous N/A Exudate Type: N/A red, brown N/A Exudate Color: Distinct, outline attached Distinct, outline attached N/A Wound Margin: Large (67-100%) Large (67-100%) N/A Granulation Amount: Pink Red, Pink N/A Granulation QualityLORANZA, LASKER S ([id number]) [id number] Page 3 of 9 None Present (0%) Small (1-33%) N/A Necrotic Amount: Fat Layer (Subcutaneous Tissue): Yes Fat Layer (Subcutaneous Tissue): Yes N/A Exposed Structures: Fascia: No Fascia: No Tendon: No Tendon: No Muscle: No Muscle: No Joint: No Joint: No Bone: No Bone: No Large (67-100%) Medium (34-66%)  N/A Epithelialization: N/A Debridement - Excisional N/A Debridement: Pre-procedure Verification/Time Out N/A 10:34 N/A Taken: N/A Lidocaine N/A Pain Control: N/A Subcutaneous, Slough N/A Tissue Debrided: N/A Skin/Subcutaneous Tissue N/A Level: N/A 0.3 N/A Debridement A (sq cm): rea N/A Curette N/A Instrument: N/A Minimum N/A Bleeding: N/A Pressure N/A Hemostasis A chieved: N/A 0 N/A Procedural Pain: N/A 0 N/A Post Procedural Pain: N/A Procedure was tolerated well N/A Debridement Treatment Response: N/A 1x0.3x0.6 N/A Post Debridement Measurements L x W x D (cm) N/A 0.141 N/A Post Debridement Volume: (cm) Callus: Yes Callus: Yes N/A Periwound Skin Texture: Excoriation: No Excoriation: No Induration: No Induration: No Crepitus: No Crepitus: No Rash: No Rash: No Scarring: No Scarring: No Maceration: No Maceration: No N/A Periwound Skin Moisture: Dry/Scaly: No Dry/Scaly: No Atrophie Blanche: No Atrophie Blanche: No N/A Periwound Skin Color: Cyanosis: No Cyanosis: No Ecchymosis: No Ecchymosis: No Erythema: No Erythema: No Hemosiderin Staining: No Hemosiderin Staining: No Mottled: No Mottled: No Pallor: No Pallor: No Rubor: No Rubor: No No Abnormality N/A N/A Temperature: N/A Debridement N/A Procedures Performed: T Contact Cast otal Treatment Notes Wound #1 (Toe Great) Wound Laterality: Left Cleanser Peri-Wound Care Topical Primary Dressing Secondary Dressing Secured With Compression Wrap Compression Stockings Add-Ons Wound #2 (Foot) Wound Laterality: Plantar, Left Cleanser Wound Cleanser Discharge Instruction: Cleanse the wound with wound cleanser prior to applying a clean dressing using gauze sponges, not tissue or cotton balls. Byram Ancillary Kit - 15 Day Supply Discharge Instruction: Use supplies as instructed; Kit contains: (15) Saline Bullets; (15) 3x3 Gauze; 15 pr Gloves Peri-Wound Care Topical Gentamicin Discharge  Instruction: As directed by physician Mupirocin Ointment Discharge Instruction: Apply Mupirocin (Bactroban) as instructed Primary Dressing JAYZON, BLAKENSHIP S ([id number]) [id number] Page 4 of 9 Promogran Prisma Matrix, 4.34 (sq in) (silver collagen) Discharge Instruction: Moisten collagen with saline or hydrogel Hydrofera Blue Ready Transfer Foam, 2.5x2.5 (in/in) Discharge Instruction: Apply directly to wound bed as directed Secondary Dressing Optifoam Non-Adhesive Dressing, 4x4 in Discharge Instruction: Apply x1 layer donut to aid in offloading. Woven Gauze Sponges 2x2 in Discharge Instruction: Apply over primary dressing as directed. Secured With Conforming Stretch Gauze Bandage, Sterile 2x75 (in/in) Discharge Instruction: Secure with stretch gauze as directed. 95M Medipore H Soft Cloth Surgical T ape, 4 x 10 (in/yd) Discharge Instruction: Secure with tape as directed. Compression Wrap Compression Stockings Add-Ons Electronic Signature(s) Signed: 04/15/2022 5:03:25 PM By: Kalman Shan DO Entered By: Kalman Shan on 04/15/2022 12:22:30 -------------------------------------------------------------------------------- Multi-Disciplinary Care Plan Details Patient Name: Date of Service: [name]. 04/15/2022 9:30 A M Medical Record Number: [id number] Patient Account Number: [id number] Date of Birth/Sex: Treating RN: 1965/10/27 (  57 y.o. [name], [name] Primary Care Gavino Fouch: [name] Other Clinician: Referring Eliah Ozawa: Treating Lataunya Ruud/Extender: [name] in Treatment: 9 Active Inactive Peripheral Neuropathy Nursing Diagnoses: Potential alteration in peripheral tissue perfusion (select prior to confirmation of diagnosis) Goals: Patient/caregiver will verbalize understanding of disease process and disease management Date Initiated: 02/11/2022 Target Resolution Date: 05/30/2022 Goal Status:  Active Interventions: Assess signs and symptoms of neuropathy upon admission and as needed Provide education on Management of Neuropathy upon discharge from the [facility]: Patient referred for customized footwear/offloading : 02/11/2022 Notes: Electronic Signature(s) Signed: 04/18/2022 11:12:19 AM By: Rhae Hammock RN Entered By: Rhae Hammock on 04/15/2022 10:41:39 -------------------------------------------------------------------------------- Pain Assessment Details Patient Name: Date of Service: [name] 04/15/2022 9:30 A M Medical Record Number: [id number] Patient Account Number: [id number] ZACKARIE, MASSARI ([id number]) [id number] Page 5 of 9 Date of Birth/Sex: Treating RN: [birth year] (57 y.o. M) Primary Care Sayvion Vigen: Other Clinician: [name] Referring Daylan Juhnke: Treating Jomel Whittlesey/Extender: [name] in Treatment: 9 Active Problems Location of Pain Severity and Description of Pain Patient Has Paino No Site Locations Pain Management and Medication Current Pain Management: Electronic Signature(s) Signed: 04/21/2022 4:16:21 PM By: Erenest Blank Entered By: Erenest Blank on 04/15/2022 09:57:35 -------------------------------------------------------------------------------- Patient/Caregiver Education Details Patient Name: Date of Service: [name] 3/19/2024andnbsp9:30 A M Medical Record Number: [id number] Patient Account Number: [id number] Date of Birth/Gender: Treating RN: 05/03/1965 (57 y.o. Erie Noe Primary Care Physician: [name] Other Clinician: Referring Physician: Treating Physician/Extender: [name] in Treatment: 9 Education Assessment Education Provided To: Patient Education Topics Provided Wound/Skin Impairment: Methods: Explain/Verbal Responses: Reinforcements needed, State  content correctly Electronic Signature(s) Signed: 04/18/2022 11:12:19 AM By: Rhae Hammock RN Entered By: Rhae Hammock on 04/15/2022 10:42:00 -------------------------------------------------------------------------------- Wound Assessment Details Patient Name: Date of Service: [name]. 04/15/2022 9:30 A M Medical Record Number: [id number] Patient Account Number: [id number] Date of Birth/Sex: Treating RN: 13-Apr-1965 (57 y.o. Erie Noe Primary Care Tacara Hadlock: [name] Other Clinician: ARMANII, BOELENS ([id number]) [id number] Page 6 of 9 Referring Shiven Junious: Treating Jasmine Maceachern/Extender: [name] in Treatment: 9 Wound Status Wound Number: 1 Primary Neuropathic Ulcer-Non Diabetic Etiology: Wound Location: Left T Great oe Wound Healed - Epithelialized Wounding Event: Shear/Friction Status: Date Acquired: 01/28/2022 Comorbid Lymphedema, Sleep Apnea, Hypertension, Peripheral Venous Weeks Of Treatment: 9 History: Disease, Osteoarthritis, Neuropathy Clustered Wound: No Pending Amputation On Presentation Photos Wound Measurements Length: (cm) Width: (cm) Depth: (cm) Area: (cm) Volume: (cm) 0 % Reduction in Area: 100% 0 % Reduction in Volume: 100% 0 Epithelialization: Large (67-100%) 0 Tunneling: No 0 Undermining: No Wound Description Classification: Full Thickness Without Exposed Suppor Wound Margin: Distinct, outline attached Exudate Amount: None Present t Structures Foul Odor After Cleansing: No Slough/Fibrino No Wound Bed Granulation Amount: Large (67-100%) Exposed Structure Granulation Quality: Pink Fascia Exposed: No Necrotic Amount: None Present (0%) Fat Layer (Subcutaneous Tissue) Exposed: Yes Tendon Exposed: No Muscle Exposed: No Joint Exposed: No Bone Exposed: No Periwound Skin Texture Texture Color No Abnormalities Noted: No No Abnormalities Noted: No Callus:  Yes Atrophie Blanche: No Crepitus: No Cyanosis: No Excoriation: No Ecchymosis: No Induration: No Erythema: No Rash: No Hemosiderin Staining: No Scarring: No Mottled: No Pallor: No Moisture Rubor: No No Abnormalities Noted: No Dry / Scaly: No Temperature / Pain Maceration: No Temperature: No Abnormality Treatment Notes Wound #1 (Toe Great) Wound Laterality: Left Cleanser Peri-Wound Care Topical Primary Dressing Secondary Dressing AZAN, FAVER S ([id number]) [id number] Page 7  of 9 Secured With Compression Wrap Compression Stockings Environmental education officer) Signed: 04/18/2022 11:12:19 AM By: Rhae Hammock RN Entered By: Rhae Hammock on 04/15/2022 10:33:13 -------------------------------------------------------------------------------- Wound Assessment Details Patient Name: Date of Service: ZAMARIUS, CAVINS 04/15/2022 9:30 A M Medical Record Number: [id number] Patient Account Number: [id number] Date of Birth/Sex: Treating RN: 1965-08-19 (57 y.o. [name], [name] Primary Care Elide Stalzer: [name] Other Clinician: Referring Steel Kerney: Treating Damascus Feldpausch/Extender: [name]: 9 Wound Status Wound Number: 2 Primary Neuropathic Ulcer-Non Diabetic Etiology: Wound Location: Left, Plantar Foot Wound Open Wounding Event: Surgical Injury Status: Date Acquired: 01/14/2022 Comorbid Lymphedema, Sleep Apnea, Hypertension, Peripheral Venous Weeks Of Treatment: 9 History: Disease, Osteoarthritis, Neuropathy Clustered Wound: No Pending Amputation On Presentation Photos Wound Measurements Length: (cm) 1 Width: (cm) 0.3 Depth: (cm) 0.5 Area: (cm) 0.236 Volume: (cm) 0.118 % Reduction in Area: -100% % Reduction in Volume: -391.7% Epithelialization: Medium (34-66%) Tunneling: No Undermining: No Wound Description Classification: Full Thickness Without Exposed  Suppor Wound Margin: Distinct, outline attached Exudate Amount: Medium Exudate Type: Serosanguineous Exudate Color: red, brown t Structures Foul Odor After Cleansing: No Slough/Fibrino Yes Wound Bed Granulation Amount: Large (67-100%) Exposed Structure Granulation Quality: Red, Pink Fascia Exposed: No Necrotic Amount: Small (1-33%) Fat Layer (Subcutaneous Tissue) Exposed: Yes Tendon Exposed: No Muscle Exposed: No Joint Exposed: No Bone Exposed: No Periwound Skin Texture Texture Color No Abnormalities Noted: No No Abnormalities Noted: No Callus: Yes Atrophie BlancheWALKER, NEUMAN S ([id number]) [id number] Page 8 of 9 Crepitus: No Cyanosis: No Excoriation: No Ecchymosis: No Induration: No Erythema: No Rash: No Hemosiderin Staining: No Scarring: No Mottled: No Pallor: No Moisture Rubor: No No Abnormalities Noted: No Dry / Scaly: No Maceration: No Treatment Notes Wound #2 (Foot) Wound Laterality: Plantar, Left Cleanser Wound Cleanser Discharge Instruction: Cleanse the wound with wound cleanser prior to applying a clean dressing using gauze sponges, not tissue or cotton balls. Byram Ancillary Kit - 15 Day Supply Discharge Instruction: Use supplies as instructed; Kit contains: (15) Saline Bullets; (15) 3x3 Gauze; 15 pr Gloves Peri-Wound Care Topical Gentamicin Discharge Instruction: As directed by physician Mupirocin Ointment Discharge Instruction: Apply Mupirocin (Bactroban) as instructed Primary Dressing Promogran Prisma Matrix, 4.34 (sq in) (silver collagen) Discharge Instruction: Moisten collagen with saline or hydrogel Hydrofera Blue Ready Transfer Foam, 2.5x2.5 (in/in) Discharge Instruction: Apply directly to wound bed as directed Secondary Dressing Optifoam Non-Adhesive Dressing, 4x4 in Discharge Instruction: Apply x1 layer donut to aid in offloading. Woven Gauze Sponges 2x2 in Discharge Instruction: Apply over primary  dressing as directed. Secured With Conforming Stretch Gauze Bandage, Sterile 2x75 (in/in) Discharge Instruction: Secure with stretch gauze as directed. 54M Medipore H Soft Cloth Surgical T ape, 4 x 10 (in/yd) Discharge Instruction: Secure with tape as directed. Compression Wrap Compression Stockings Add-Ons Electronic Signature(s) Signed: 04/18/2022 11:12:19 AM By: Rhae Hammock RN Entered By: Rhae Hammock on 04/15/2022 10:42:34 -------------------------------------------------------------------------------- Vitals Details Patient Name: Date of Service: [name]. 04/15/2022 9:30 A M Medical Record Number: [id number] Patient Account Number: [id number] Date of Birth/Sex: Treating RN: 1965/04/04 (57 y.o. M) Primary Care Irma Delancey: [name] Other Clinician: Referring Ramil Edgington: Treating Haedyn Breau/Extender: [name] in Treatment: 9 Vital Signs Time Taken: 09:54 Temperature (F): 98.6 Height (in): 75 Pulse (bpm): 67 Weight (lbs): 220 Respiratory Rate (breaths/min): 17 Stordahl, Jarquez S ([id number]) [id number] Page 9 of 9 Body Mass Index (BMI): 27.5 Blood Pressure (mmHg): 151/92 Reference Range: 80 - 120 mg / dl Electronic Signature(s) Signed: 04/21/2022 4:16:21 PM By:  Erenest Blank Entered By: Erenest Blank on 04/15/2022 09:57:23

## 2022-04-22 ENCOUNTER — Encounter (HOSPITAL_BASED_OUTPATIENT_CLINIC_OR_DEPARTMENT_OTHER): Payer: BC Managed Care – PPO | Admitting: Internal Medicine

## 2022-04-22 DIAGNOSIS — I890 Lymphedema, not elsewhere classified: Secondary | ICD-10-CM | POA: Diagnosis not present

## 2022-04-22 DIAGNOSIS — L97522 Non-pressure chronic ulcer of other part of 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: Secondary | ICD-10-CM | POA: Diagnosis not present

## 2022-04-22 DIAGNOSIS — G9009 Other idiopathic peripheral autonomic neuropathy: Secondary | ICD-10-CM | POA: Diagnosis not present

## 2022-04-22 DIAGNOSIS — F17210 Nicotine dependence, cigarettes, uncomplicated: Secondary | ICD-10-CM | POA: Diagnosis not present

## 2022-04-22 DIAGNOSIS — G4733 Obstructive sleep apnea (adult) (pediatric): Secondary | ICD-10-CM | POA: Diagnosis not present

## 2022-04-22 DIAGNOSIS — L02612 Cutaneous abscess of left foot: Secondary | ICD-10-CM | POA: Diagnosis not present

## 2022-04-22 DIAGNOSIS — I872 Venous insufficiency (chronic) (peripheral): Secondary | ICD-10-CM | POA: Diagnosis not present

## 2022-04-22 DIAGNOSIS — M1990 Unspecified osteoarthritis, unspecified site: Secondary | ICD-10-CM | POA: Diagnosis not present

## 2022-04-22 DIAGNOSIS — I10 Essential (primary) hypertension: Secondary | ICD-10-CM | POA: Diagnosis not present

## 2022-04-23 DIAGNOSIS — R600 Localized edema: Secondary | ICD-10-CM | POA: Diagnosis not present

## 2022-04-23 DIAGNOSIS — I10 Essential (primary) hypertension: Secondary | ICD-10-CM | POA: Diagnosis not present

## 2022-04-23 DIAGNOSIS — M549 Dorsalgia, unspecified: Secondary | ICD-10-CM | POA: Diagnosis not present

## 2022-04-23 DIAGNOSIS — F1120 Opioid dependence, uncomplicated: Secondary | ICD-10-CM | POA: Diagnosis not present

## 2022-04-29 ENCOUNTER — Encounter (HOSPITAL_BASED_OUTPATIENT_CLINIC_OR_DEPARTMENT_OTHER): Payer: BC Managed Care – PPO | Attending: General Surgery | Admitting: General Surgery

## 2022-04-29 DIAGNOSIS — L02612 Cutaneous abscess of left foot: Secondary | ICD-10-CM | POA: Insufficient documentation

## 2022-04-29 DIAGNOSIS — I872 Venous insufficiency (chronic) (peripheral): Secondary | ICD-10-CM | POA: Diagnosis not present

## 2022-04-29 DIAGNOSIS — G9009 Other idiopathic peripheral autonomic neuropathy: Secondary | ICD-10-CM | POA: Insufficient documentation

## 2022-04-29 DIAGNOSIS — M1990 Unspecified osteoarthritis, unspecified site: Secondary | ICD-10-CM | POA: Insufficient documentation

## 2022-04-29 DIAGNOSIS — G4733 Obstructive sleep apnea (adult) (pediatric): Secondary | ICD-10-CM | POA: Diagnosis not present

## 2022-04-29 DIAGNOSIS — I10 Essential (primary) hypertension: Secondary | ICD-10-CM | POA: Insufficient documentation

## 2022-04-29 DIAGNOSIS — L97522 Non-pressure chronic ulcer of other part of 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: Secondary | ICD-10-CM | POA: Diagnosis not present

## 2022-04-29 NOTE — Progress Notes (Signed)
TRENDYN, ARRELLANO ([id number]) [id number] Page 1 of 8 Visit Report for 04/29/2022 Chief Complaint Document Details Patient Name: Date of Service: [name], [name] 04/29/2022 10:15 A M Medical Record Number: [id number] Patient Account Number: [id number] Date of Birth/Sex: Treating RN: March 30, 1965 (57 y.o. M) Primary Care Provider: [name] Other Clinician: Referring Provider: Treating Provider/Extender: Rhodia Albright in Treatment: 11 Information Obtained from: Patient Chief Complaint 02/11/2022 left great toe wound and fourth sub-met head wound Electronic Signature([name]) Signed: 04/29/2022 10:58:45 AM By: Fredirick Maudlin MD FACS Entered By: Fredirick Maudlin on 04/29/2022 10:58:45 -------------------------------------------------------------------------------- HPI Details Patient Name: Date of Service: [name]. 04/29/2022 10:15 A M Medical Record Number: [id number] Patient Account Number: [id number] Date of Birth/Sex: Treating RN: Apr 06, 1965 (57 y.o. M) Primary Care Provider: [name] Other Clinician: Referring Provider: Treating Provider/Extender: Rhodia Albright in Treatment: 11 History of Present Illness HPI Description: 02/11/2022 [name] is a 57 year old male with a past medical history of current tobacco user, OSA, venous insufficiency and peripheral neuropathy that presents the clinic for a 2-[name] history of ulcer to the left great toe and 2-35-month history of nonhealing ulcer to the left plantar foot. He works in a [employer] and is on his feet all day. He wears steel toed boots. He is currently out of work due to his wounds. On 11/26/2021 he was admitted to the hospital for cellulitis and abscess to the left foot. 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 He states he has been on doxycycline for 10 weeks. He just completed this  course. He uses regular tennis shoes for walking. He is not offloading the area. He is using Betadine for wound dressings. He has been following with Dr. Posey Pronto, podiatry who Referred to our clinic. 1/23; patient presents for follow-up. He has been using Medihoney and Hydrofera Blue to the wound beds Along with his surgical shoe and offloading peg assist. He has no issues or complaints today. He denies signs of infection. 1/30; patient presents for follow-up. He has been using Medihoney and Hydrofera Blue to the wound beds. He has no issues or complaints today. Plan is for the total contact cast and patient is agreeable to move forward with this. 2/1; patient presents for follow-up. We have been using antibiotic ointment with Hydrofera Blue to the wound beds under the total contact cast. He presents today for his obligatory cast change. He had no issues with the cast. 2/6; patient presents for follow-up. We have been using antibiotic ointment with Hydrofera Blue to the wound beds under the total contact cast. He has no issues or complaints today. He has tolerated the cast well. 2/13; patient presents for follow-up. We have been using the total contact cast to the left lower extremity. We have been using Hydrofera Blue and antibiotic ointment to the wound beds. He has no issues or complaints today. 2/20; patient presents for follow-up. We were using the total contact cast along with Hydrofera Blue and antibiotic ointment. He states the cast was slightly uncomfortable over the past [name]. 2/27; Patient presents for follow-up. We have been using Hydrofera Blue with antibiotic ointment under the total contact cast. He tolerated the cast well and has no issues or complaints today. 3/5; Patient presents for follow-up. We have been using Hydrofera Blue with antibiotic ointment under the total contact cast. He tolerated the cast well and has no issues or complaints today. 3/13; patient presents for  follow-up. We have been using  Hydrofera Blue with collagen to the left plantar foot wound and Hydrofera Blue with antibiotic ointment to the left toe wound all under the total contact cast. He had no issues or complaints today. 3/19; patient presents for follow-up. We have been using Hydrofera Blue with collagen to the left plantar foot wound and antibiotic ointment with Hydrofera Blue to the left great toe wound. All under the total contact cast. The toe wound is healed. 3/26; patient presents for follow-up. We have been using Hydrofera Blue and collagen to the plantar foot wound. The left great toe wound remains healed. We have been doing this under the total contact cast. 04/29/2022: There is just a small open area at the base of the wound, which is otherwise clean. His periwound skin is fairly macerated. [name], [name] ([id number]) [id number] Page 2 of 8 Electronic Signature([name]) Signed: 04/29/2022 10:59:50 AM By: Fredirick Maudlin MD FACS Entered By: Fredirick Maudlin on 04/29/2022 10:59:50 -------------------------------------------------------------------------------- Physical Exam Details Patient Name: Date of Service: [name], [name] 04/29/2022 10:15 A M Medical Record Number: [id number] Patient Account Number: [id number] Date of Birth/Sex: Treating RN: February 05, 1965 (57 y.o. M) Primary Care Provider: [name] Other Clinician: Referring Provider: Treating Provider/Extender: Rhodia Albright in Treatment: 11 Constitutional Hypertensive, asymptomatic. . . . no acute distress. Respiratory Normal work of breathing on room air. Notes 04/29/2022: There is just a small open area at the base of the wound, which is otherwise clean. His periwound skin is fairly macerated. Electronic Signature([name]) Signed: 04/29/2022 11:00:23 AM By: Fredirick Maudlin MD FACS Entered By: Fredirick Maudlin on 04/29/2022  11:00:22 -------------------------------------------------------------------------------- Physician Orders Details Patient Name: Date of Service: [name] 04/29/2022 10:15 A M Medical Record Number: [id number] Patient Account Number: [id number] Date of Birth/Sex: Treating RN: Jun 07, 1965 (57 y.o. 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 - Dr. Heber Florence Tuesday 05/06/2022 0845 Room 9 Return A Anesthetic (In clinic) Topical Lidocaine 4% applied to wound bed Bathing/ Shower/ Hygiene May shower with protection but do not get wound dressing([name]) wet. Protect dressing([name]) with water repellant cover (for example, large plastic bag) or a cast cover and may then take shower. Edema Control - Lymphedema / SCD / Other Elevate legs to the level of the heart or above for 30 minutes daily and/or when sitting for 3-4 times a day throughout the day. Avoid standing for long periods of time. Off-Loading Total Contact Cast to Left Lower Extremity - Size 4 apply zinc between toes and any moisture noted areas on foot. Wound Treatment Wound #2 - Foot Wound Laterality: Plantar, Left ESTIN, LAMSON [name] ([id number]) [id number] Page 3 of 8 Cleanser: Wound Cleanser 1 x Per [name]/30 Days Discharge Instructions: Cleanse the wound with wound cleanser prior to applying a clean dressing using gauze sponges, not tissue or cotton balls. Cleanser: Byram Ancillary Kit - 15 Day Supply (Generic)  1 x Per [name]/30 Days Discharge Instructions: Use supplies as instructed; Kit contains: (15) Saline Bullets; (15) 3x3 Gauze; 15 pr Gloves Topical: Gentamicin 1 x Per [name]/30 Days Discharge Instructions: As directed by physician Topical: Mupirocin Ointment 1 x Per [name]/30 Days Discharge Instructions: Apply Mupirocin (Bactroban)  as instructed Prim Dressing: Hydrofera Blue Ready Transfer Foam, 2.5x2.5 (in/in) (Generic) 1 x Per [name]/30 Days ary Discharge Instructions: Apply directly to wound bed as directed Secondary Dressing: Optifoam Non-Adhesive Dressing, 4x4 in (Generic) 1 x Per [name]/30 Days Discharge Instructions: Apply x1 layer donut to aid in offloading. Secondary Dressing: Woven Gauze Sponges 2x2 in (Generic) 1 x Per [name]/30 Days Discharge Instructions: Apply over primary dressing as directed. Secured With: Child psychotherapist, Sterile 2x75 (in/in) (Generic) 1 x Per [name]/30 Days Discharge Instructions: Secure with stretch gauze as directed. Secured With: 57M Medipore H Soft Cloth Surgical T ape, 4 x 10 (in/yd) (Generic) 1 x Per [name]/30 Days Discharge Instructions: Secure with tape as directed. Electronic Signature([name]) Signed: 04/29/2022 12:34:56 PM By: Fredirick Maudlin MD FACS Entered By: Fredirick Maudlin on 04/29/2022 11:00:34 -------------------------------------------------------------------------------- Problem List Details Patient Name: Date of Service: [name] 04/29/2022 10:15 A M Medical Record Number: [id number] Patient Account Number: [id number] Date of Birth/Sex: Treating RN: July 20, 1965 (57 y.o. [name] Primary Care Provider: [name] Other Clinician: Referring Provider: Treating Provider/Extender: Rhodia Albright in Treatment: 11 Active Problems ICD-10 Encounter Code Description Active Date MDM Diagnosis [phone] Non-pressure chronic ulcer of other part of 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 02/11/2022 No  Yes G90.09 Other idiopathic peripheral autonomic neuropathy 02/11/2022 No Yes G47.33 Obstructive sleep apnea (adult) (pediatric) 02/11/2022 No Yes I87.2 Venous insufficiency (chronic) (peripheral) 02/11/2022 No Yes Inactive Problems Resolved Problems Electronic Signature([name]) Signed: 04/29/2022 10:58:34 AM By: Fredirick Maudlin MD FACS Entered By: Fredirick Maudlin on 04/29/2022 10:58:34 [place], Wenzel [name] ([id number]) [id number] Page 4 of 8 -------------------------------------------------------------------------------- Progress Note Details Patient Name: Date of Service: [name], [name] 04/29/2022 10:15 A M Medical Record Number: [id number] Patient Account Number: [id number] Date of Birth/Sex: Treating RN: August 20, 1965 (57 y.o. M) Primary Care Provider: [name] Other Clinician: Referring Provider: Treating Provider/Extender: Rhodia Albright in Treatment: 11 Subjective Chief Complaint Information obtained from Patient 02/11/2022 left great toe wound and fourth sub-met head wound History of Present Illness (HPI) 02/11/2022 Mr. Michiah Krocker is a 57 year old male with a past medical history of current tobacco user, OSA, venous insufficiency and peripheral neuropathy that presents the clinic for a 2-[name] history of ulcer to the left great toe and 2-63-month history of nonhealing ulcer to the left plantar foot. He works in a [employer] and is on his feet all day. He wears steel toed boots. He is currently out of work due to his wounds. On 11/26/2021 he was admitted to the hospital for cellulitis and abscess to the left foot. 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 He states he has been on doxycycline for 10 weeks. He just completed this course. He uses regular tennis shoes for walking. He is not offloading the area. He is using Betadine for wound dressings. He has been following with Dr. Posey Pronto,  podiatry who Referred to our clinic. 1/23; patient presents for follow-up. He has been using Medihoney and Hydrofera Blue to the wound beds Along with his surgical shoe and offloading peg assist. He has no issues or complaints today. He denies signs of infection. 1/30; patient presents for follow-up. He has been using Medihoney and Hydrofera Blue to the wound beds. He has no issues or complaints today. Plan is for the total contact cast and patient is agreeable to move forward with this. 2/1; patient presents for follow-up. We have been using antibiotic ointment with Hydrofera Blue to the wound beds  under the total contact cast. He presents today for his obligatory cast change. He had no issues with the cast. 2/6; patient presents for follow-up. We have been using antibiotic ointment with Hydrofera Blue to the wound beds under the total contact cast. He has no issues or complaints today. He has tolerated the cast well. 2/13; patient presents for follow-up. We have been using the total contact cast to the left lower extremity. We have been using Hydrofera Blue and antibiotic ointment to the wound beds. He has no issues or complaints today. 2/20; patient presents for follow-up. We were using the total contact cast along with Hydrofera Blue and antibiotic ointment. He states the cast was slightly uncomfortable over the past [name]. 2/27; Patient presents for follow-up. We have been using Hydrofera Blue with antibiotic ointment under the total contact cast. He tolerated the cast well and has no issues or complaints today. 3/5; Patient presents for follow-up. We have been using Hydrofera Blue with antibiotic ointment under the total contact cast. He tolerated the cast well and has no issues or complaints today. 3/13; patient presents for follow-up. We have been using Hydrofera Blue with collagen to the left plantar foot wound and Hydrofera Blue with antibiotic ointment to the left toe wound all under the  total contact cast. He had no issues or complaints today. 3/19; patient presents for follow-up. We have been using Hydrofera Blue with collagen to the left plantar foot wound and antibiotic ointment with Hydrofera Blue to the left great toe wound. All under the total contact cast. The toe wound is healed. 3/26; patient presents for follow-up. We have been using Hydrofera Blue and collagen to the plantar foot wound. The left great toe wound remains healed. We have been doing this under the total contact cast. 04/29/2022: There is just a small open area at the base of the wound, which is otherwise clean. His periwound skin is fairly macerated. Patient History Family History Heart Disease - Mother, Lung Disease - Mother. Social History Current every day smoker - 10 cig a day, Alcohol Use - Never, Drug Use - No History, Caffeine Use - Never. Medical History Hematologic/Lymphatic Patient has history of Lymphedema - pumps use daily Respiratory Patient has history of Sleep Apnea - CPAP Cardiovascular Patient has history of Hypertension, Peripheral Venous Disease - needs ablation on left leg Musculoskeletal Patient has history of Osteoarthritis Denies history of Osteomyelitis Neurologic Patient has history of Neuropathy Hospitalization/Surgery History - 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. Medical A Surgical History Notes nd [name], [name] ([id number]) [id number] Page 5 of 8 Musculoskeletal abscess to left foot 3rd toe 11/28/2022 Objective Constitutional Hypertensive, asymptomatic. no acute distress. Vitals Time Taken: 10:32 AM, Height: 75 in, Weight: 220 lbs, BMI: 27.5, Temperature: 98.2 F, Pulse: 63 bpm, Respiratory Rate: 17 breaths/min, Blood Pressure: 150/79 mmHg. Respiratory Normal work of breathing on room air. General Notes: 04/29/2022: There is just a small open area at the base of the wound, which is otherwise clean. His periwound skin is  fairly macerated. Integumentary (Hair, Skin) Wound #2 status is Open. Original cause of wound was Surgical Injury. The date acquired was: 01/14/2022. The wound has been in treatment 11 weeks. The wound is located on the [place]. The wound measures 0.2cm length x 0.2cm width x 0.2cm depth; 0.031cm^2 area and 0.006cm^3 volume. There is Fat Layer (Subcutaneous Tissue) exposed. There is no tunneling or undermining noted. There is a medium amount of serosanguineous drainage noted. The wound margin is  distinct with the outline attached to the wound base. There is medium (34-66%) red, pink granulation within the wound bed. There is a medium (34-66%) amount of necrotic tissue within the wound bed including Adherent Slough. The periwound skin appearance exhibited: Callus. The periwound skin appearance did not exhibit: Crepitus, Excoriation, Induration, Rash, Scarring, Dry/Scaly, Maceration, Atrophie Blanche, Cyanosis, Ecchymosis, Hemosiderin Staining, Mottled, Pallor, Rubor, Erythema. 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 There was a T Contact Cast Procedure by Alyson Ingles, MD. Post procedure Diagnosis Wound #2: Same as Pre-Procedure Notes: size 4. Plan Follow-up Appointments: Return Appointment in 1 [name]. - Dr. Heber Terminous Tuesday 05/06/2022 0845 Room 9 Anesthetic: (In clinic) Topical Lidocaine 4% applied to wound bed Bathing/ Shower/ Hygiene: May shower with protection but do not get wound dressing([name]) wet. Protect dressing([name]) with water repellant cover (for example, large plastic bag) or a cast cover and may then take shower. Edema Control -  Lymphedema / SCD / Other: Elevate legs to the level of the heart or above for 30 minutes daily and/or when sitting for 3-4 times a day throughout the day. Avoid standing for long periods of time. Off-Loading: T Contact Cast to Left Lower Extremity - Size 4 apply zinc between toes and any moisture noted areas on foot. otal WOUND #2: - Foot Wound Laterality: Plantar, Left Cleanser: Wound Cleanser 1 x Per [name]/30 Days Discharge Instructions: Cleanse the wound with wound cleanser prior to applying a clean dressing using gauze sponges, not tissue or cotton balls. Cleanser: Byram Ancillary Kit - 15 Day Supply (Generic) 1 x Per [name]/30 Days Discharge Instructions: Use supplies as instructed; Kit contains: (15) Saline Bullets; (15) 3x3 Gauze; 15 pr Gloves Topical: Gentamicin 1 x Per [name]/30 Days [name], [name] ([id number]) [id number] Page 6 of 8 Discharge Instructions: As directed by physician Topical: Mupirocin Ointment 1 x Per [name]/30 Days Discharge Instructions: Apply Mupirocin (Bactroban) as instructed Prim Dressing: Hydrofera Blue Ready Transfer Foam, 2.5x2.5 (in/in) (Generic) 1 x Per [name]/30 Days ary Discharge Instructions: Apply directly to wound bed as directed Secondary Dressing: Optifoam Non-Adhesive Dressing, 4x4 in (Generic) 1 x Per [name]/30 Days Discharge Instructions: Apply x1 layer donut to aid in offloading. Secondary Dressing: Woven Gauze Sponges 2x2 in (Generic) 1 x Per [name]/30 Days Discharge Instructions: Apply over primary dressing as directed. Secured With: Child psychotherapist, Sterile 2x75 (in/in) (Generic) 1 x Per [name]/30 Days Discharge Instructions: Secure with stretch gauze as directed. Secured With: 29M Medipore H Soft Cloth Surgical T ape, 4 x 10 (in/yd) (Generic) 1 x Per [name]/30 Days Discharge Instructions: Secure with tape as directed. 04/29/2022: There is just a small open area at the base of the wound, which is otherwise  clean. His periwound skin is fairly macerated. The wound bed was prepared for total contact casting. T opical mupirocin was mixed with topical gentamicin and applied along with Hydrofera Blue. We will apply zinc oxide to the periwound skin in between his toes to try and minimize the tissue maceration. A total contact cast was then applied in standard fashion. He will follow-up in 1 [name]. Electronic Signature([name]) Signed: 04/29/2022 11:01:27 AM By: Fredirick Maudlin MD FACS Entered By: Fredirick Maudlin on 04/29/2022 11:01:27 -------------------------------------------------------------------------------- HxROS Details Patient Name: Date of Service: John Mae  [name]. 04/29/2022 10:15 A M Medical Record Number: [id number] Patient Account Number: [id number] Date of Birth/Sex: Treating RN: 08/14/1965 (57 y.o. M) Primary Care Provider: [name] Other Clinician: Referring Provider: Treating Provider/Extender: Rhodia Albright in Treatment: 11 Hematologic/Lymphatic Medical History: Positive for: Lymphedema - pumps use daily Respiratory Medical History: Positive for: Sleep Apnea - CPAP Cardiovascular Medical History: Positive for: Hypertension; Peripheral Venous Disease - needs ablation on left leg Musculoskeletal Medical History: Positive for: Osteoarthritis Negative for: Osteomyelitis Past Medical History Notes: abscess to left foot 3rd toe 11/28/2022 Neurologic Medical History: Positive for: Neuropathy Immunizations Implantable Devices No devices added Hospitalization / Surgery History Type of Hospitalization/Surgery 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 [name], [name] ([id number]) [id number] Page 7 of 8 Family and Social History Heart Disease: Yes - Mother; Lung Disease: Yes - Mother; Current every day smoker - 10 cig a day; Alcohol Use: Never; Drug Use: No History; Caffeine Use: Never; Financial Concerns:  No; Food, Clothing or Shelter Needs: No; Support System Lacking: No; Transportation Concerns: No Electronic Signature([name]) Signed: 04/29/2022 12:34:56 PM By: Fredirick Maudlin MD FACS Entered By: Fredirick Maudlin on 04/29/2022 10:59:58 -------------------------------------------------------------------------------- Total Contact Cast Details Patient Name: Date of Service: [name], [name] 04/29/2022 10:15 A M Medical Record Number: [id number] Patient Account Number: [id number] Date of Birth/Sex: Treating RN: August 22, 1965 (57 y.o. [name] Primary Care Provider: [name] Other Clinician: Referring Provider: Treating Provider/Extender: Rhodia Albright in Treatment: 11 T Contact Cast Applied for Wound Assessment: otal Wound #2 Left,Plantar Foot Performed By: Physician Fredirick Maudlin, MD Post Procedure Diagnosis Same as Pre-procedure Notes size 4 Electronic Signature([name]) Signed: 04/29/2022 12:34:56 PM By: Fredirick Maudlin MD FACS Signed: 04/29/2022 4:09:50 PM By: Deon Pilling RN, BSN Entered By: Deon Pilling on 04/29/2022 10:55:31 -------------------------------------------------------------------------------- SuperBill Details Patient Name: Date of Service: [name] 04/29/2022 Medical Record Number: [id number] Patient Account Number: [id number] Date of Birth/Sex: Treating RN: 04-09-1965 (57 y.o. [name] Primary Care Provider: [name] Other Clinician: Referring Provider: Treating Provider/Extender: Rhodia Albright in Treatment: 11 Diagnosis Coding ICD-10 Codes Code Description [phone] 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 G47.33 Obstructive sleep apnea (adult) (pediatric) I87.2 Venous insufficiency (chronic) (peripheral) Facility Procedures : CPT4 Code: [id number] Description: [phone] - APPLY  TOTAL CONTACT LEG CAST ICD-10 Diagnosis Description L97.522 Non-pressure chronic ulcer of other part of 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 Modifier: Quantity: 1 Physician Procedures : CPT4 Code Description Modifier [id number] - WC PHYS LEVEL 4 - EST PT ICD-10 Diagnosis Description L97.522 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 I87.2  Venous insufficiency (chronic) (peripheral) [name], [name] ([id number]) [id number] - WC PHYS APPLY TOTAL CONTACT CAST 1 ICD-10 Diagnosis Description L97.522 Non-pressure chronic ulcer of other part of left foot with  fat layer exposed Quantity: 1 [id number].pdf Page 8 of 8 Electronic Signature([name]) Signed: 04/29/2022 11:01:41 AM By: Fredirick Maudlin MD FACS Entered By: Fredirick Maudlin on 04/29/2022 11:01:41

## 2022-04-29 NOTE — Progress Notes (Signed)
[name] ([id number]) [id number] Page 1 of 6 Visit Report for 04/29/2022 Arrival Information Details Patient Name: Date of Service: [name], [name] 04/29/2022 10:15 A M Medical Record Number: [id number] Patient Account Number: [id number] Date of Birth/Sex: Treating RN: [birth year] (57 y.o. M) Primary Care [name]: [name] Other Clinician: Referring [name]: Treating [name]/Extender: [name] in Treatment: 11 Visit Information History Since Last Visit Added or deleted any medications: No Patient Arrived: Ambulatory Any new allergies or adverse reactions: No Arrival Time: 10:33 Had a fall or experienced change in No Accompanied By: self activities of daily living that may affect Transfer Assistance: None risk of falls: Patient Identification Verified: Yes Signs or symptoms of abuse/neglect since last visito No Secondary Verification Process Completed: Yes Hospitalized since last visit: No Patient Requires Transmission-Based No Implantable device outside of the clinic excluding No Precautions: cellular tissue based products placed in the center Patient Has Alerts: Yes since last visit: Patient Alerts: 11/27/21 ABI L 1.26 R1.09 Has Dressing in Place as Prescribed: Yes 11/27/2021 TBI L0.72 Pain Present Now: No R0.66 Electronic Signature([name]) Signed: 04/29/2022 1:02:24 PM By: [name] Entered By: [name] on 04/29/2022 10:33:23 -------------------------------------------------------------------------------- Lower Extremity Assessment Details Patient Name: Date of Service: [name], [name] 04/29/2022 10:15 A M Medical Record Number: [id number] Patient Account Number: [id number] Date of Birth/Sex: Treating RN: [birth year][name] (57 y.o. [name] Primary Care [name]: [name] Other Clinician: Referring [name]: Treating [name]/Extender: [name] in Treatment: 11 Edema Assessment Assessed: [name]: Yes] [name]: No] Edema: [Left: Ye] [Right: [name]] Calf Left: Right: Point of Measurement: 43 cm From Medial Instep 42 cm Ankle Left: Right: Point of Measurement: 11 cm From Medial Instep 28 cm Vascular Assessment Pulses: Dorsalis Pedis Palpable: [Left:Yes] Electronic Signature([name]) Signed: 04/29/2022 4:09:50 PM By: John Pilling RN, BSN Entered By: [name] on 04/29/2022 10:43:54 [name] ([id number]) [id number] Page 2 of 6 -------------------------------------------------------------------------------- Multi Wound Chart Details Patient Name: Date of Service: [name], [name] 04/29/2022 10:15 A M Medical Record Number: [id number] Patient Account Number: [id number] Date of Birth/Sex: Treating RN: [name] (57 y.o. M) Primary Care [name]: [name] Other Clinician: Referring [name]: Treating [name]/Extender: [name] in Treatment: 11 Vital Signs Height(in): 75 Pulse(bpm): 63 Weight(lbs): 220 Blood Pressure(mmHg): 150/79 Body Mass Index(BMI): 27.5 Temperature(F): 98.2 Respiratory Rate(breaths/min): 17 [2:Photos:] [N/A:N/A] Left, Plantar Foot N/A N/A Wound Location: Surgical Injury N/A N/A Wounding Event: Neuropathic Ulcer-Non Diabetic N/A N/A Primary Etiology: Lymphedema, Sleep Apnea, N/A N/A Comorbid History: Hypertension, Peripheral Venous Disease, Osteoarthritis, Neuropathy 01/14/2022 N/A N/A Date Acquired: 11 N/A N/A Weeks of Treatment: Open N/A N/A Wound Status: No N/A N/A Wound Recurrence: Yes N/A N/A Pending A mputation on Presentation: 0.2x0.2x0.2 N/A N/A Measurements L x W x D (cm) 0.031 N/A N/A A (cm) : rea 0.006 N/A N/A Volume (cm) : 73.70% N/A N/A % Reduction in A rea: 75.00% N/A N/A % Reduction in Volume: Full Thickness Without Exposed N/A N/A Classification: Support Structures Medium  N/A N/A Exudate Amount: Serosanguineous N/A N/A Exudate Type: red, brown N/A N/A Exudate Color: Distinct, outline attached N/A N/A Wound Margin: Medium (34-66%) N/A N/A Granulation Amount: Red, Pink N/A N/A Granulation Quality: Medium (34-66%) N/A N/A Necrotic Amount: Fat Layer (Subcutaneous Tissue): Yes N/A N/A Exposed Structures: Fascia: No Tendon: No Muscle: No Joint: No Bone: No Large (67-100%) N/A N/A Epithelialization: Callus: Yes N/A N/A Periwound Skin Texture: Excoriation: No Induration: No Crepitus: No Rash: No Scarring: No Maceration: No N/A  N/A Periwound Skin Moisture: Dry/Scaly: No Atrophie Blanche: No N/A N/A Periwound Skin Color: Cyanosis: No Ecchymosis: No Erythema: No Hemosiderin Staining: No Mottled: No Pallor: No Rubor: No T Contact Cast otal N/A N/A Procedures Performed: Treatment Notes [name], [name] ([id number]) [id number] Page 3 of 6 Electronic Signature([name]) Signed: 04/29/2022 10:58:39 AM By: John Maudlin MD FACS Entered By: [name] on 04/29/2022 10:58:39 -------------------------------------------------------------------------------- Multi-Disciplinary Care Plan Details Patient Name: Date of Service: [name], [name] 04/29/2022 10:15 A M Medical Record Number: [id number] Patient Account Number: [id number] Date of Birth/Sex: Treating RN: [name] (57 y.o. [name] Primary Care [name]: [name] Other Clinician: Referring [name]: Treating [name]/Extender: [name] in Treatment: 11 Active Inactive Peripheral Neuropathy Nursing Diagnoses: Potential alteration in peripheral tissue perfusion (select prior to confirmation of diagnosis) Goals: Patient/caregiver will verbalize understanding of disease process and disease management Date Initiated: 02/11/2022 Target Resolution Date: 05/30/2022 Goal Status: Active Interventions: Assess  signs and symptoms of neuropathy upon admission and as needed Provide education on Management of Neuropathy upon discharge from the [place]: Patient referred for customized footwear/offloading : 02/11/2022 Notes: Electronic Signature([name]) Signed: 04/29/2022 4:09:50 PM By: John Pilling RN, BSN Entered By: [name] on 04/29/2022 10:47:42 -------------------------------------------------------------------------------- Pain Assessment Details Patient Name: Date of Service: [name] 04/29/2022 10:15 A M Medical Record Number: [id number] Patient Account Number: [id number] Date of Birth/Sex: Treating RN: [birth year] (57 y.o. M) Primary Care Darby Fleeman: [name] Other Clinician: Referring Grant Henkes: Treating Dealva Lafoy/Extender: [name] in Treatment: 11 Active Problems Location of Pain Severity and Description of Pain Patient Has Paino No Site Locations [place], [place] [name] ([id number]) [id number] Page 4 of 6 Pain Management and Medication Current Pain Management: Electronic Signature([name]) Signed: 04/29/2022 1:02:24 PM By: [name] Entered By: [name] on 04/29/2022 10:33:31 -------------------------------------------------------------------------------- Patient/Caregiver Education Details Patient Name: Date of Service: [name] 4/2/2024andnbsp10:15 [place] Record Number: [id number] Patient Account Number: [id number] Date of Birth/Gender: Treating RN: [name][birth year] (57 y.o. [name] Primary Care Physician: [name] Other Clinician: Referring Physician: Treating Physician/Extender: [name] in Treatment: 11 Education Assessment Education Provided To: Patient Education Topics Provided Wound/Skin Impairment: Handouts: Caring for Your Ulcer Methods: Explain/Verbal Responses: Reinforcements needed Electronic  Signature([name]) Signed: 04/29/2022 4:09:50 PM By: John Pilling RN, BSN Entered By: [name] on 04/29/2022 10:48:16 -------------------------------------------------------------------------------- Wound Assessment Details Patient Name: Date of Service: [name] 04/29/2022 10:15 A M Medical Record Number: [id number] Patient Account Number: [id number] Date of Birth/Sex: Treating RN: [birth year] (57 y.o. [name] Primary Care Cherylyn Sundby: [name] Other Clinician: Referring Crosby Oriordan: Treating Jaelen Gellerman/Extender: Janeece Riggers Weeks in Treatment: 11 Wound Status Wound Number: 2 Primary Neuropathic Ulcer-Non Diabetic Etiology: Wound Location: Left, Plantar Foot Wound Open Wounding Event: Surgical Injury Status: Date Acquired: 01/14/2022 Comorbid Lymphedema, Sleep Apnea, Hypertension, Peripheral Venous Weeks Of Treatment: [address] [place] [name] ([id number]) [id number] Page 5 of 6 Weeks Of Treatment: 11 History: Disease, Osteoarthritis, Neuropathy Clustered Wound: No Pending Amputation On Presentation Photos Wound Measurements Length: (cm) 0.2 Width: (cm) 0.2 Depth: (cm) 0.2 Area: (cm) 0.031 Volume: (cm) 0.006 % Reduction in Area: 73.7% % Reduction in Volume: 75% Epithelialization: Large (67-100%) Tunneling: No Undermining: No Wound Description Classification: Full Thickness Without Exposed Suppor Wound Margin: Distinct, outline attached Exudate Amount: Medium Exudate Type: Serosanguineous Exudate Color: red, brown t Structures Foul Odor After Cleansing: No Slough/Fibrino Yes Wound Bed Granulation Amount: Medium (34-66%) Exposed Structure Granulation Quality: Red, Pink Fascia  Exposed: No Necrotic Amount: Medium (34-66%) Fat Layer (Subcutaneous Tissue) Exposed: Yes Necrotic Quality: Adherent Slough Tendon Exposed: No Muscle Exposed: No Joint Exposed: No Bone Exposed: No Periwound Skin  Texture Texture Color No Abnormalities Noted: No No Abnormalities Noted: No Callus: Yes Atrophie Blanche: No Crepitus: No Cyanosis: No Excoriation: No Ecchymosis: No Induration: No Erythema: No Rash: No Hemosiderin Staining: No Scarring: No Mottled: No Pallor: No Moisture Rubor: No No Abnormalities Noted: No Dry / Scaly: No Maceration: No Electronic Signature([name]) Signed: 04/29/2022 1:02:24 PM By: [name] Signed: 04/29/2022 4:09:50 PM By: John Pilling RN, BSN Entered By: [name] on 04/29/2022 10:48:18 -------------------------------------------------------------------------------- Economy Details Patient Name: Date of Service: [name]. 04/29/2022 10:15 A M Medical Record Number: [id number] Patient Account Number: [id number] Date of Birth/Sex: Treating RN: [name] (57 y.o. M) Primary Care Karmina Zufall: [name] Other Clinician: Referring Prosper Paff: Treating Pennye Beeghly/Extender: [name] in Treatment: [address] [place], [place] [name] ([id number]) [id number] Page 6 of 6 Time Taken: 10:32 Temperature (F): 98.2 Height (in): 75 Pulse (bpm): 63 Weight (lbs): 220 Respiratory Rate (breaths/min): 17 Body Mass Index (BMI): 27.5 Blood Pressure (mmHg): 150/79 Reference Range: 80 - 120 mg / dl Electronic Signature([name]) Signed: 04/29/2022 1:02:24 PM By: [name] Entered By: [name] on 04/29/2022 10:33:03

## 2022-04-30 NOTE — Progress Notes (Signed)
John, [name] ([id number]) [id number] Page 1 of 8 Visit Report for 04/22/2022 Chief Complaint Document Details Patient Name: Date of Service: [name], [name] 04/22/2022 11:00 A M Medical Record Number: [id number] Patient Account Number: [id number] Date of Birth/Sex: Treating RN: 12-Dec-1965 (57 y.o. M) Primary Care Provider: [name] Other Clinician: Referring Provider: Treating Provider/Extender: [name] in Treatment: 10 Information Obtained from: Patient Chief Complaint 02/11/2022 left great toe wound and fourth sub-met head wound Electronic Signature([name]) Signed: 04/22/2022 12:03:36 PM By: Kalman Shan DO Entered By: Kalman Shan on 04/22/2022 12:00:08 -------------------------------------------------------------------------------- Debridement Details Patient Name: Date of Service: John Kelp. 04/22/2022 11:00 A M Medical Record Number: [id number] Patient Account Number: [id number] Date of Birth/Sex: Treating RN: Jan 10, 1966 (57 y.o. [name], [name] Primary Care Provider: [name] Other Clinician: Referring Provider: Treating Provider/Extender: [name] in Treatment: 10 Debridement Performed for Assessment: Wound #2 Left,Plantar Foot Performed By: Physician Kalman Shan, DO Debridement Type: Debridement Level of Consciousness (Pre-procedure): Awake and Alert Pre-procedure Verification/Time Out Yes - 11:30 Taken: Start Time: 11:30 Pain Control: Lidocaine T Area Debrided (L x W): otal 0.5 (cm) x 0.2 (cm) = 0.1 (cm) Tissue and other material debrided: Viable, Non-Viable, Callus, Slough, Subcutaneous, Slough Level: Skin/Subcutaneous Tissue Debridement Description: Excisional Instrument: Curette Bleeding: Minimum Hemostasis Achieved: Pressure End Time: 11:30 Procedural Pain: 0 Post Procedural Pain: 0 Response to Treatment: Procedure  was tolerated well Level of Consciousness (Post- Awake and Alert procedure): Post Debridement Measurements of Total Wound Length: (cm) 0.5 Width: (cm) 0.2 Depth: (cm) 0.5 Volume: (cm) 0.039 Character of Wound/Ulcer Post Debridement: Improved Post Procedure Diagnosis Same as Pre-procedure Electronic Signature([name]) Signed: 04/22/2022 12:03:36 PM By: Kalman Shan DO Signed: 04/30/2022 4:26:45 PM By: Rhae Hammock RN Entered By: Rhae Hammock on 04/22/2022 11:34:39 Hortman, [name] ([id number]) [id number] Page 2 of 8 -------------------------------------------------------------------------------- HPI Details Patient Name: Date of Service: [name], [name] 04/22/2022 11:00 A M Medical Record Number: [id number] Patient Account Number: [id number] Date of Birth/Sex: Treating RN: 11/04/1965 (57 y.o. M) Primary Care Provider: [name] Other Clinician: Referring Provider: Treating Provider/Extender: [name] in Treatment: 10 History of Present Illness HPI Description: 02/11/2022 [name] is a 57 year old male with a past medical history of current tobacco user, OSA, venous insufficiency and peripheral neuropathy that presents the clinic for a 2-week history of ulcer to the left great toe and 2-63-month history of nonhealing ulcer to the left plantar foot. He works in a [employer] and is on his feet all day. He wears steel toed boots. He is currently out of work due to his wounds. On 11/26/2021 he was admitted to the hospital for cellulitis and abscess to the left foot. 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 He states he has been on doxycycline for 10 weeks. He just completed this course. He uses regular tennis shoes for walking. He is not offloading the area. He is using Betadine for wound dressings. He has been following with Dr. Posey Pronto, podiatry who Referred to our  clinic. 1/23; patient presents for follow-up. He has been using Medihoney and Hydrofera Blue to the wound beds Along with his surgical shoe and offloading peg assist. He has no issues or complaints today. He denies signs of infection. 1/30; patient presents for follow-up. He has been using Medihoney and Hydrofera Blue to the wound beds. He has no issues or complaints today. Plan is for the total contact cast and patient is agreeable to  move forward with this. 2/1; patient presents for follow-up. We have been using antibiotic ointment with Hydrofera Blue to the wound beds under the total contact cast. He presents today for his obligatory cast change. He had no issues with the cast. 2/6; patient presents for follow-up. We have been using antibiotic ointment with Hydrofera Blue to the wound beds under the total contact cast. He has no issues or complaints today. He has tolerated the cast well. 2/13; patient presents for follow-up. We have been using the total contact cast to the left lower extremity. We have been using Hydrofera Blue and antibiotic ointment to the wound beds. He has no issues or complaints today. 2/20; patient presents for follow-up. We were using the total contact cast along with Hydrofera Blue and antibiotic ointment. He states the cast was slightly uncomfortable over the past week. 2/27; Patient presents for follow-up. We have been using Hydrofera Blue with antibiotic ointment under the total contact cast. He tolerated the cast well and has no issues or complaints today. 3/5; Patient presents for follow-up. We have been using Hydrofera Blue with antibiotic ointment under the total contact cast. He tolerated the cast well and has no issues or complaints today. 3/13; patient presents for follow-up. We have been using Hydrofera Blue with collagen to the left plantar foot wound and Hydrofera Blue with antibiotic ointment to the left toe wound all under the total contact cast. He had no  issues or complaints today. 3/19; patient presents for follow-up. We have been using Hydrofera Blue with collagen to the left plantar foot wound and antibiotic ointment with Hydrofera Blue to the left great toe wound. All under the total contact cast. The toe wound is healed. 3/26; patient presents for follow-up. We have been using Hydrofera Blue and collagen to the plantar foot wound. The left great toe wound remains healed. We have been doing this under the total contact cast. Electronic Signature([name]) Signed: 04/22/2022 12:03:36 PM By: Kalman Shan DO Entered By: Kalman Shan on 04/22/2022 12:00:56 -------------------------------------------------------------------------------- Physical Exam Details Patient Name: Date of Service: [name], [name] 04/22/2022 11:00 A M Medical Record Number: [id number] Patient Account Number: [id number] Date of Birth/Sex: Treating RN: [birth year] (57 y.o. M) Primary Care Provider: [name] Other Clinician: Referring Provider: Treating Provider/Extender: [name] in Treatment: 10 Constitutional respirations regular, non-labored and within target range for patient.. Cardiovascular 2+ dorsalis pedis/posterior tibialis pulses. Psychiatric pleasant and cooperative. [name], [name] ([id number]) [id number] Page 3 of 8 Notes Left foot: T the plantar aspect of the foot there is an open wound with granulation tissue, non viable tissue and callus. No probing to bone. No signs of o surrounding soft tissue infection to any of the wound beds. Electronic Signature([name]) Signed: 04/22/2022 12:03:36 PM By: Kalman Shan DO Entered By: Kalman Shan on 04/22/2022 12:02:16 -------------------------------------------------------------------------------- Physician Orders Details Patient Name: Date of Service: John Kelp. 04/22/2022 11:00 A M Medical Record Number:  [id number] Patient Account Number: [id number] Date of Birth/Sex: Treating RN: 27-Apr-1965 (57 y.o. Erie Noe Primary Care Provider: [name] Other Clinician: Referring Provider: Treating Provider/Extender: [name] in Treatment: 10 Verbal / Phone Orders: No Diagnosis Coding Follow-up Appointments ppointment in 1 week. - W/ Dr. Celine Ahr and [place] Rm # 4 Tuesday 04/29/22 @ 10:15 Return A Anesthetic (In clinic) Topical Lidocaine 4% applied to wound bed Bathing/ Shower/ Hygiene May shower with protection but do not get wound dressing([name]) wet. Protect dressing([name]) with water repellant cover (for example,  large plastic bag) or a cast cover and may then take shower. Edema Control - Lymphedema / SCD / Other Elevate legs to the level of the heart or above for 30 minutes daily and/or when sitting for 3-4 times a day throughout the day. Avoid standing for long periods of time. Off-Loading Total Contact Cast to Left Lower Extremity - Size 4 Wound Treatment Wound #2 - Foot Wound Laterality: Plantar, Left Cleanser: Wound Cleanser 1 x Per Week/30 Days Discharge Instructions: Cleanse the wound with wound cleanser prior to applying a clean dressing using gauze sponges, not tissue or cotton balls. Cleanser: Byram Ancillary Kit - 15 Day Supply (Generic) 1 x Per Week/30 Days Discharge Instructions: Use supplies as instructed; Kit contains: (15) Saline Bullets; (15) 3x3 Gauze; 15 pr Gloves Topical: Gentamicin 1 x Per Week/30 Days Discharge Instructions: As directed by physician Topical: Mupirocin Ointment 1 x Per Week/30 Days Discharge Instructions: Apply Mupirocin (Bactroban) as instructed Prim Dressing: Hydrofera Blue Ready Transfer Foam, 2.5x2.5 (in/in) (Generic) 1 x Per Week/30 Days ary Discharge Instructions: Apply directly to wound bed as directed Secondary Dressing: Optifoam Non-Adhesive Dressing, 4x4 in (Generic) 1 x Per Week/30 Days Discharge  Instructions: Apply x1 layer donut to aid in offloading. Secondary Dressing: Woven Gauze Sponges 2x2 in (Generic) 1 x Per Week/30 Days Discharge Instructions: Apply over primary dressing as directed. Secured With: Child psychotherapist, Sterile 2x75 (in/in) (Generic) 1 x Per Week/30 Days Discharge Instructions: Secure with stretch gauze as directed. Secured With: 32M Medipore H Soft Cloth Surgical T ape, 4 x 10 (in/yd) (Generic) 1 x Per Week/30 Days Discharge Instructions: Secure with tape as directed. Electronic Signature([name]) Signed: 04/22/2022 12:03:36 PM By: Kalman Shan DO Entered By: Kalman Shan on 04/22/2022 12:02:22 Leitzke, Antionette Char ([id number]) [id number] Page 4 of 8 -------------------------------------------------------------------------------- Problem List Details Patient Name: Date of Service: [name], [name] 04/22/2022 11:00 A M Medical Record Number: [id number] Patient Account Number: [id number] Date of Birth/Sex: Treating RN: 10/22/1965 (57 y.o. M) Primary Care Provider: [name] Other Clinician: Referring Provider: Treating Provider/Extender: [name] in Treatment: 10 Active Problems ICD-10 Encounter Code Description Active Date MDM Diagnosis [phone] Non-pressure chronic ulcer of other part of 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 02/11/2022 No Yes G90.09 Other idiopathic peripheral autonomic neuropathy 02/11/2022 No Yes G47.33 Obstructive sleep apnea (adult) (pediatric) 02/11/2022 No Yes I87.2 Venous insufficiency (chronic) (peripheral) 02/11/2022 No Yes Inactive Problems Resolved Problems Electronic Signature([name]) Signed: 04/22/2022 12:03:36 PM By: Kalman Shan DO Entered By: Kalman Shan on 04/22/2022 11:59:47 -------------------------------------------------------------------------------- Progress Note Details Patient Name: Date of Service: John Kelp. 04/22/2022  11:00 A M Medical Record Number: [id number] Patient Account Number: [id number] Date of Birth/Sex: Treating RN: 10/09/1965 (57 y.o. M) Primary Care Provider: [name] Other Clinician: Referring Provider: Treating Provider/Extender: [name] in Treatment: 10 Subjective Chief Complaint Information obtained from Patient 02/11/2022 left great toe wound and fourth sub-met head wound History of Present Illness (HPI) 02/11/2022 Mr. Man Mom is a 57 year old male with a past medical history of current tobacco user, OSA, venous insufficiency and peripheral neuropathy that presents the clinic for a 2-week history of ulcer to the left great toe and 2-83-month history of nonhealing ulcer to the left plantar foot. He works in a [employer] and is on his feet all day. He wears steel toed boots. He is currently out of work due to his wounds. On 11/26/2021 he was admitted to the hospital for cellulitis and abscess to the  left foot. 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 He states he has been on doxycycline for 10 weeks. He just completed this course. He uses regular tennis shoes for walking. He is not offloading the area. He is using Betadine for wound dressings. He has been following with Dr. Posey Pronto, podiatry who Referred to our clinic. 1/23; patient presents for follow-up. He has been using Medihoney and Hydrofera Blue to the wound beds Along with his surgical shoe and offloading peg assist. He has no issues or complaints today. He denies signs of infection. 1/30; patient presents for follow-up. He has been using Medihoney and Hydrofera Blue to the wound beds. He has no issues or complaints today. Plan is for the total contact cast and patient is agreeable to move forward with this. 2/1; patient presents for follow-up. We have been using antibiotic ointment with Hydrofera Blue to the wound beds under the total contact cast. He  presents today for his obligatory cast change. He had no issues with the cast. [name], [name] ([id number]) [id number] Page 5 of 8 2/6; patient presents for follow-up. We have been using antibiotic ointment with Hydrofera Blue to the wound beds under the total contact cast. He has no issues or complaints today. He has tolerated the cast well. 2/13; patient presents for follow-up. We have been using the total contact cast to the left lower extremity. We have been using Hydrofera Blue and antibiotic ointment to the wound beds. He has no issues or complaints today. 2/20; patient presents for follow-up. We were using the total contact cast along with Hydrofera Blue and antibiotic ointment. He states the cast was slightly uncomfortable over the past week. 2/27; Patient presents for follow-up. We have been using Hydrofera Blue with antibiotic ointment under the total contact cast. He tolerated the cast well and has no issues or complaints today. 3/5; Patient presents for follow-up. We have been using Hydrofera Blue with antibiotic ointment under the total contact cast. He tolerated the cast well and has no issues or complaints today. 3/13; patient presents for follow-up. We have been using Hydrofera Blue with collagen to the left plantar foot wound and Hydrofera Blue with antibiotic ointment to the left toe wound all under the total contact cast. He had no issues or complaints today. 3/19; patient presents for follow-up. We have been using Hydrofera Blue with collagen to the left plantar foot wound and antibiotic ointment with Hydrofera Blue to the left great toe wound. All under the total contact cast. The toe wound is healed. 3/26; patient presents for follow-up. We have been using Hydrofera Blue and collagen to the plantar foot wound. The left great toe wound remains healed. We have been doing this under the total contact cast. Patient History Family History Heart  Disease - Mother, Lung Disease - Mother. Social History Current every day smoker - 10 cig a day, Alcohol Use - Never, Drug Use - No History, Caffeine Use - Never. Medical History Hematologic/Lymphatic Patient has history of Lymphedema - pumps use daily Respiratory Patient has history of Sleep Apnea - CPAP Cardiovascular Patient has history of Hypertension, Peripheral Venous Disease - needs ablation on left leg Musculoskeletal Patient has history of Osteoarthritis Denies history of Osteomyelitis Neurologic Patient has history of Neuropathy Hospitalization/Surgery History - 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. Medical A Surgical History Notes nd Musculoskeletal abscess to left foot 3rd toe 11/28/2022 Objective Constitutional respirations regular, non-labored and within target range for patient.. Vitals Time Taken: 11:08  AM, Height: 75 in, Weight: 220 lbs, BMI: 27.5, Temperature: 98.4 F, Pulse: 69 bpm, Respiratory Rate: 17 breaths/min, Blood Pressure: 157/83 mmHg. Cardiovascular 2+ dorsalis pedis/posterior tibialis pulses. Psychiatric pleasant and cooperative. General Notes: Left foot: T the plantar aspect of the foot there is an open wound with granulation tissue, non viable tissue and callus. No probing to bone. No o signs of surrounding soft tissue infection to any of the wound beds. Integumentary (Hair, Skin) Wound #2 status is Open. Original cause of wound was Surgical Injury. The date acquired was: 01/14/2022. The wound has been in treatment 10 weeks. The wound is located on the [place]. The wound measures 0.5cm length x 0.2cm width x 0.5cm depth; 0.079cm^2 area and 0.039cm^3 volume. There is Fat Layer (Subcutaneous Tissue) exposed. There is no tunneling or undermining noted. There is a medium amount of serosanguineous drainage noted. The wound margin is distinct with the outline attached to the wound base. There is large (67-100%) red, pink granulation  within the wound bed. There is no necrotic tissue within the wound bed. The periwound skin appearance exhibited: Callus. The periwound skin appearance did not exhibit: Crepitus, Excoriation, Induration, Rash, Scarring, Dry/Scaly, Maceration, Atrophie Blanche, Cyanosis, Ecchymosis, Hemosiderin Staining, Mottled, Pallor, Rubor, Erythema. 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 I debrided nonviable tissue. At this time I recommended just doing [facility] with antibiotic ointment under the total contact cast. Follow-up in 1 week. Procedures Wound #2 Pre-procedure diagnosis of Wound #2 is a Neuropathic Ulcer-Non Diabetic located on the Left,Plantar Foot . There was a Excisional Skin/Subcutaneous Tissue Debridement with a total area of 0.1 sq cm performed by Kalman Shan, DO. With the following instrument([name]): Curette to remove Viable and Non-Viable tissue/material. Material removed includes Callus, Subcutaneous Tissue, and Slough after achieving pain control using Lidocaine. No specimens were taken. A time out was conducted at 11:30, prior to the start of the procedure. A Minimum amount of bleeding was controlled with Pressure. The procedure was tolerated well with a pain level of 0 throughout and a pain level of 0 following the procedure. Post Debridement Measurements: 0.5cm length x 0.2cm width x 0.5cm depth; 0.039cm^3 volume. Character of Wound/Ulcer Post Debridement is improved. Post procedure Diagnosis Wound #2: Same as Pre-Procedure Plan Follow-up Appointments: Return Appointment in 1 week. - W/ Dr. Celine Ahr and [place] Rm # 4 Tuesday 04/29/22 @  10:15 Anesthetic: (In clinic) Topical Lidocaine 4% applied to wound bed Bathing/ Shower/ Hygiene: May shower with protection but do not get wound dressing([name]) wet. Protect dressing([name]) with water repellant cover (for example, large plastic bag) or a cast cover and may then take shower. Edema Control - Lymphedema / SCD / Other: Elevate legs to the level of the heart or above for 30 minutes daily and/or when sitting for 3-4 times a day throughout the day. Avoid standing for long periods of time. Off-Loading: T Contact Cast to Left Lower Extremity - Size 4 otal WOUND #2: - Foot Wound Laterality: Plantar, Left Cleanser: Wound Cleanser 1 x Per Week/30 Days Discharge Instructions: Cleanse the wound with wound cleanser prior to applying a clean dressing using gauze sponges, not tissue or cotton balls. Cleanser: Byram Ancillary Kit - 15 Day Supply (Generic) 1 x Per Week/30 Days Discharge Instructions: Use supplies as instructed; Kit  contains: (15) Saline Bullets; (15) 3x3 Gauze; 15 pr Gloves Topical: Gentamicin 1 x Per Week/30 Days Discharge Instructions: As directed by physician Topical: Mupirocin Ointment 1 x Per Week/30 Days Discharge Instructions: Apply Mupirocin (Bactroban) as instructed Prim Dressing: Hydrofera Blue Ready Transfer Foam, 2.5x2.5 (in/in) (Generic) 1 x Per Week/30 Days ary Discharge Instructions: Apply directly to wound bed as directed Secondary Dressing: Optifoam Non-Adhesive Dressing, 4x4 in (Generic) 1 x Per Week/30 Days Discharge Instructions: Apply x1 layer donut to aid in offloading. Secondary Dressing: Woven Gauze Sponges 2x2 in (Generic) 1 x Per Week/30 Days Discharge Instructions: Apply over primary dressing as directed. Secured With: Child psychotherapist, Sterile 2x75 (in/in) (Generic) 1 x Per Week/30 Days Discharge Instructions: Secure with stretch gauze as directed. Secured With: 46M Medipore H Soft Cloth Surgical T ape, 4 x 10 (in/yd) (Generic) 1 x Per  Week/30 Days Discharge Instructions: Secure with tape as directed. 1. In office sharp debridement 2. Hydrofera Blue with antibiotic ointment 3. T contact cast placed in standard fashion otal 4. Follow-up in 1 week Electronic Signature([name]) Signed: 04/22/2022 12:03:36 PM By: Kalman Shan DO Entered By: Kalman Shan on 04/22/2022 12:02:54 [name], Ash [name] ([id number]) [id number] Page 7 of 8 -------------------------------------------------------------------------------- HxROS Details Patient Name: Date of Service: UTKARSH, RIESSEN 04/22/2022 11:00 A M Medical Record Number: [id number] Patient Account Number: [id number] Date of Birth/Sex: Treating RN: [birth year] (56 y.o. M) Primary Care Provider: [name] Other Clinician: Referring Provider: Treating Provider/Extender: [name] in Treatment: 10 Hematologic/Lymphatic Medical History: Positive for: Lymphedema - pumps use daily Respiratory Medical History: Positive for: Sleep Apnea - CPAP Cardiovascular Medical History: Positive for: Hypertension; Peripheral Venous Disease - needs ablation on left leg Musculoskeletal Medical History: Positive for: Osteoarthritis Negative for: Osteomyelitis Past Medical History Notes: abscess to left foot 3rd toe 11/28/2022 Neurologic Medical History: Positive for: Neuropathy Immunizations Implantable Devices No devices added Hospitalization / Surgery History Type of Hospitalization/Surgery 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 Family and Social History Heart Disease: Yes - Mother; Lung Disease: Yes - Mother; Current every day smoker - 10 cig a day; Alcohol Use: Never; Drug Use: No History; Caffeine Use: Never; Financial Concerns: No; Food, Clothing or Shelter Needs: No; Support System Lacking: No; Transportation Concerns: No Electronic Signature([name]) Signed: 04/22/2022 12:03:36 PM By: Kalman Shan  DO Entered By: Kalman Shan on 04/22/2022 12:01:19 -------------------------------------------------------------------------------- SuperBill Details Patient Name: Date of Service: John Kelp 04/22/2022 Medical Record Number: [id number] Patient Account Number: [id number] Date of Birth/Sex: Treating RN: [birth year] (57 y.o. M) Primary Care Provider: [name] Other Clinician: Referring Provider: Treating Provider/Extender: [name] in Treatment: 10 Diagnosis Coding ICD-10 Codes Code Description [phone] Non-pressure chronic ulcer of other part of 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 [place], [place] ([id number]) [id number] Page 8 of 8 G90.09 Other idiopathic peripheral autonomic neuropathy G47.33 Obstructive sleep apnea (adult) (pediatric) I87.2 Venous insufficiency (chronic) (peripheral) Facility Procedures : CPT4 Code: [id number] Description: [id number] - DEB SUBQ TISSUE 20 SQ CM/< ICD-10 Diagnosis Description L97.522 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 Modifier: Quantity: 1 Physician Procedures : CPT4 Code Description Modifier [id number] - WC PHYS SUBQ TISS 20 SQ CM ICD-10 Diagnosis Description 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 

## 2022-04-30 NOTE — Progress Notes (Signed)
STEDMON, HIBDON [name] ([id number]) [id number] Page 1 of 6 Visit Report for 04/22/2022 Arrival Information Details Patient Name: Date of Service: [name] 04/22/2022 11:00 A M Medical Record Number: [id number] Patient Account Number: [id number] Date of Birth/Sex: Treating RN: 12-11-1965 (57 y.o. [name], [name] Primary Care Sanay Belmar: [name] Other Clinician: Referring Tyrick Dunagan: Treating Catricia Scheerer/Extender: [name] in Treatment: 10 Visit Information History Since Last Visit Added or deleted any medications: No Patient Arrived: Ambulatory Any new allergies or adverse reactions: No Arrival Time: 11:06 Had a fall or experienced change in No Accompanied By: self activities of daily living that may affect Transfer Assistance: None risk of falls: Patient Identification Verified: Yes Signs or symptoms of abuse/neglect since last visito No Secondary Verification Process Completed: Yes Hospitalized since last visit: No Patient Requires Transmission-Based No Implantable device outside of the clinic excluding No Precautions: cellular tissue based products placed in the center Patient Has Alerts: Yes since last visit: Patient Alerts: 11/27/21 ABI L 1.26 R1.09 Has Dressing in Place as Prescribed: Yes 11/27/2021 TBI L0.72 Has Footwear/Offloading in Place as Prescribed: Yes R0.66 Left: T Contact Cast otal Pain Present Now: No Electronic Signature([name]) Signed: 04/30/2022 4:26:45 PM By: Rhae Hammock RN Entered By: Rhae Hammock on 04/22/2022 11:08:39 -------------------------------------------------------------------------------- Lower Extremity Assessment Details Patient Name: Date of Service: [name]. 04/22/2022 11:00 A M Medical Record Number: [id number] Patient Account Number: [id number] Date of Birth/Sex: Treating RN: [birth year] (57 y.o. [name], [name] Primary Care Denaly Gatling:  [name] Other Clinician: Referring Deaundre Allston: Treating Ruthe Roemer/Extender: [name]: 10 Edema Assessment Assessed: [name]: Yes] [name]: No] Edema: [Left: Ye] [Right: [name]] Calf Left: Right: Point of Measurement: 43 cm From Medial Instep 42 cm Ankle Left: Right: Point of Measurement: 11 cm From Medial Instep 27 cm Vascular Assessment Pulses: Dorsalis Pedis Palpable: [Left:Yes] Posterior Tibial Palpable: [Left:Yes] Electronic Signature([name]) Signed: 04/30/2022 4:26:45 PM By: Rhae Hammock RN Entered By: Rhae Hammock on 04/22/2022 11:09:04 [name] ([id number]) [id number] Page 2 of 6 -------------------------------------------------------------------------------- Multi Wound Chart Details Patient Name: Date of Service: [name] 04/22/2022 11:00 A M Medical Record Number: [id number] Patient Account Number: [id number] Date of Birth/Sex: Treating RN: Mar 21, 1965 (57 y.o. M) Primary Care Lainey Nelson: [name] Other Clinician: Referring Brittany Osier: Treating Kristen Bushway/Extender: [name] in Treatment: 10 Vital Signs Height(in): 75 Pulse(bpm): 79 Weight(lbs): 220 Blood Pressure(mmHg): 157/83 Body Mass Index(BMI): 27.5 Temperature(F): 98.4 Respiratory Rate(breaths/min): 17 [2:Photos:] [N/A:N/A] Left, Plantar Foot N/A N/A Wound Location: Surgical Injury N/A N/A Wounding Event: Neuropathic Ulcer-Non Diabetic N/A N/A Primary Etiology: Lymphedema, Sleep Apnea, N/A N/A Comorbid History: Hypertension, Peripheral Venous Disease, Osteoarthritis, Neuropathy 01/14/2022 N/A N/A Date Acquired: 10 N/A N/A Weeks of Treatment: Open N/A N/A Wound Status: No N/A N/A Wound Recurrence: Yes N/A N/A Pending A mputation on Presentation: 0.5x0.2x0.5 N/A N/A Measurements L x W x D (cm) 0.079 N/A N/A A (cm) : rea 0.039 N/A N/A Volume (cm)  : 33.10% N/A N/A % Reduction in A rea: -62.50% N/A N/A % Reduction in Volume: Full Thickness Without Exposed N/A N/A Classification: Support Structures Medium N/A N/A Exudate A mount: Serosanguineous N/A N/A Exudate Type: red, brown N/A N/A Exudate Color: Distinct, outline attached N/A N/A Wound Margin: Large (67-100%) N/A N/A Granulation A mount: Red, Pink N/A N/A Granulation Quality: None Present (0%) N/A N/A Necrotic A mount: Fat Layer (Subcutaneous Tissue): Yes N/A N/A Exposed Structures: Fascia: No Tendon: No Muscle: No Joint: No Bone: No Medium (34-66%) N/A  N/A Epithelialization: Debridement - Excisional N/A N/A Debridement: Pre-procedure Verification/Time Out 11:30 N/A N/A Taken: Lidocaine N/A N/A Pain Control: Callus, Subcutaneous, Slough N/A N/A Tissue Debrided: Skin/Subcutaneous Tissue N/A N/A Level: 0.1 N/A N/A Debridement A (sq cm): rea Curette N/A N/A Instrument: Minimum N/A N/A Bleeding: Pressure N/A N/A Hemostasis A chieved: 0 N/A N/A Procedural Pain: 0 N/A N/A Post Procedural Pain: Procedure was tolerated well N/A N/A Debridement Treatment Response: 0.5x0.2x0.5 N/A N/A Post Debridement Measurements L x W x D (cm) 0.039 N/A N/A Post Debridement Volume: (cm) Callus: Yes N/A N/A Periwound Skin Texture: Excoriation: No [name] ([id number]) [id number].pdf Page 3 of 6 Induration: No Crepitus: No Rash: No Scarring: No Maceration: No N/A N/A Periwound Skin Moisture: Dry/Scaly: No Atrophie Blanche: No N/A N/A Periwound Skin Color: Cyanosis: No Ecchymosis: No Erythema: No Hemosiderin Staining: No Mottled: No Pallor: No Rubor: No Debridement N/A N/A Procedures Performed: Treatment Notes Electronic Signature([name]) Signed: 04/22/2022 12:03:36 PM By: Kalman Shan DO Entered By: Kalman Shan on 04/22/2022  11:59:57 -------------------------------------------------------------------------------- Multi-Disciplinary Care Plan Details Patient Name: Date of Service: [name]. 04/22/2022 11:00 A M Medical Record Number: [id number] Patient Account Number: [id number] Date of Birth/Sex: Treating RN: [birth year] (57 y.o. [name], [name] Primary Care Nolita Kutter: [name] Other Clinician: Referring Darria Corvera: Treating Bracy Pepper/Extender: [name] in Treatment: 10 Active Inactive Peripheral Neuropathy Nursing Diagnoses: Potential alteration in peripheral tissue perfusion (select prior to confirmation of diagnosis) Goals: Patient/caregiver will verbalize understanding of disease process and disease management Date Initiated: 02/11/2022 Target Resolution Date: 05/30/2022 Goal Status: Active Interventions: Assess signs and symptoms of neuropathy upon admission and as needed Provide education on Management of Neuropathy upon discharge from the [place]: Patient referred for customized footwear/offloading : 02/11/2022 Notes: Electronic Signature([name]) Signed: 04/30/2022 4:26:45 PM By: Rhae Hammock RN Entered By: Rhae Hammock on 04/22/2022 11:32:11 -------------------------------------------------------------------------------- Pain Assessment Details Patient Name: Date of Service: [name]. 04/22/2022 11:00 A M Medical Record Number: [id number] Patient Account Number: [id number] Date of Birth/Sex: Treating RN: [birth year] (57 y.o. Erie Noe Primary Care Deen Deguia: [name] Other Clinician: Referring Esabella Stockinger: Treating Laranda Burkemper/Extender: [name]: 10 Active Problems Location of Pain Severity and Description of Pain [name] ([id number]) [id number] Page 4 of 6 Patient Has Paino No Site Locations Pain  Management and Medication Current Pain Management: Electronic Signature([name]) Signed: 04/30/2022 4:26:45 PM By: Rhae Hammock RN Entered By: Rhae Hammock on 04/22/2022 11:08:57 -------------------------------------------------------------------------------- Patient/Caregiver Education Details Patient Name: Date of Service: [name]. 3/26/2024andnbsp11:00 [place] Record Number: [id number] Patient Account Number: [id number] Date of Birth/Gender: Treating RN: [birth year] (57 y.o. Erie Noe Primary Care Physician: [name] Other Clinician: Referring Physician: Treating Physician/Extender: [name] in Treatment: 10 Education Assessment Education Provided To: Patient Education Topics Provided Wound/Skin Impairment: Methods: Explain/Verbal Responses: Reinforcements needed, State content correctly Electronic Signature([name]) Signed: 04/30/2022 4:26:45 PM By: Rhae Hammock RN Entered By: Rhae Hammock on 04/22/2022 11:32:26 -------------------------------------------------------------------------------- Wound Assessment Details Patient Name: Date of Service: [name]. 04/22/2022 11:00 A M Medical Record Number: [id number] Patient Account Number: [id number] Date of Birth/Sex: Treating RN: 1965-04-02 (57 y.o. Erie Noe Primary Care Glynis Hunsucker: [name] Other Clinician: Referring Jodie Leiner: Treating Ajamu Maxon/Extender: [name]: 10 Wound Status Wound Number: 2 Primary Neuropathic Ulcer-Non Diabetic Etiology: Wound Location: Left, Plantar Foot Wound Open Wounding Event: Surgical Injury Status: Date Acquired: 01/14/2022 [name] ([id number]) [phone].pdf Page 5  of 6 Date Acquired: 01/14/2022 Comorbid Lymphedema, Sleep Apnea, Hypertension, Peripheral Venous Weeks Of Treatment: 10 History: Disease,  Osteoarthritis, Neuropathy Clustered Wound: No Pending Amputation On Presentation Photos Wound Measurements Length: (cm) 0.5 Width: (cm) 0.2 Depth: (cm) 0.5 Area: (cm) 0.079 Volume: (cm) 0.039 % Reduction in Area: 33.1% % Reduction in Volume: -62.5% Epithelialization: Medium (34-66%) Tunneling: No Undermining: No Wound Description Classification: Full Thickness Without Exposed Suppor Wound Margin: Distinct, outline attached Exudate Amount: Medium Exudate Type: Serosanguineous Exudate Color: red, brown t Structures Foul Odor After Cleansing: No Slough/Fibrino Yes Wound Bed Granulation Amount: Large (67-100%) Exposed Structure Granulation Quality: Red, Pink Fascia Exposed: No Necrotic Amount: None Present (0%) Fat Layer (Subcutaneous Tissue) Exposed: Yes Tendon Exposed: No Muscle Exposed: No Joint Exposed: No Bone Exposed: No Periwound Skin Texture Texture Color No Abnormalities Noted: No No Abnormalities Noted: No Callus: Yes Atrophie Blanche: No Crepitus: No Cyanosis: No Excoriation: No Ecchymosis: No Induration: No Erythema: No Rash: No Hemosiderin Staining: No Scarring: No Mottled: No Pallor: No Moisture Rubor: No No Abnormalities Noted: No Dry / Scaly: No Maceration: No Electronic Signature([name]) Signed: 04/30/2022 4:26:45 PM By: Rhae Hammock RN Entered By: Rhae Hammock on 04/22/2022 11:27:46 -------------------------------------------------------------------------------- Vitals Details Patient Name: Date of Service: [name]. 04/22/2022 11:00 A M Medical Record Number: [id number] Patient Account Number: [id number] Date of Birth/Sex: Treating RN: Jul 16, 1965 (57 y.o. [name], [name] Primary Care Dhani Dannemiller: [name] Other Clinician: Referring Doral Digangi: Treating Marcy Bogosian/Extender: [name]: [address] [name] ([id number]) [id number]  Page 6 of 6 Time Taken: 11:08 Temperature (F): 98.4 Height (in): 75 Pulse (bpm): 69 Weight (lbs): 220 Respiratory Rate (breaths/min): 17 Body Mass Index (BMI): 27.5 Blood Pressure (mmHg): 157/83 Reference Range: 80 - 120 mg / dl Electronic Signature([name]) Signed: 04/30/2022 4:26:45 PM By: Rhae Hammock RN Entered By: Rhae Hammock on 04/22/2022 11:08:52

## 2022-05-06 ENCOUNTER — Encounter (HOSPITAL_BASED_OUTPATIENT_CLINIC_OR_DEPARTMENT_OTHER): Payer: BC Managed Care – PPO | Admitting: Internal Medicine

## 2022-05-06 DIAGNOSIS — I872 Venous insufficiency (chronic) (peripheral): Secondary | ICD-10-CM

## 2022-05-06 DIAGNOSIS — I10 Essential (primary) hypertension: Secondary | ICD-10-CM | POA: Diagnosis not present

## 2022-05-06 DIAGNOSIS — L02612 Cutaneous abscess of left foot: Secondary | ICD-10-CM | POA: Diagnosis not present

## 2022-05-06 DIAGNOSIS — G9009 Other idiopathic peripheral autonomic neuropathy: Secondary | ICD-10-CM

## 2022-05-06 DIAGNOSIS — M1990 Unspecified osteoarthritis, unspecified site: Secondary | ICD-10-CM | POA: Diagnosis not present

## 2022-05-06 DIAGNOSIS — L97522 Non-pressure chronic ulcer of other part of 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: Secondary | ICD-10-CM

## 2022-05-06 DIAGNOSIS — G4733 Obstructive sleep apnea (adult) (pediatric): Secondary | ICD-10-CM | POA: Diagnosis not present

## 2022-05-07 NOTE — Progress Notes (Signed)
YUNUS, REGINO ([id number]) [id number] Page 1 of 7 Visit Report for 05/06/2022 Arrival Information Details Patient Name: Date of Service: KOLBY, GARRON 05/06/2022 8:45 A M Medical Record Number: [id number] Patient Account Number: [id number] Date of Birth/Sex: Treating RN: [birth year] (57 y.o. Charlean Merl, [name] Primary Care Lanee Chain: [name] Other Clinician: Referring Luken Shadowens: Treating Lavelle Berland/Extender: Claria Dice in Treatment: 12 Visit Information History Since Last Visit Added or deleted any medications: No Patient Arrived: Ambulatory Any new allergies or adverse reactions: No Arrival Time: 09:06 Had a fall or experienced change in No Accompanied By: self activities of daily living that may affect Transfer Assistance: None risk of falls: Patient Identification Verified: Yes Signs or symptoms of abuse/neglect since last visito No Secondary Verification Process Completed: Yes Hospitalized since last visit: No Patient Requires Transmission-Based No Implantable device outside of the clinic excluding No Precautions: cellular tissue based products placed in the center Patient Has Alerts: Yes since last visit: Patient Alerts: 11/27/21 ABI L 1.26 R1.09 Has Dressing in Place as Prescribed: Yes 11/27/2021 TBI L0.72 Pain Present Now: No R0.66 Electronic Signature(s) Signed: 05/07/2022 3:30:40 PM By: Fonnie Mu RN Entered By: Fonnie Mu on 05/06/2022 09:06:26 -------------------------------------------------------------------------------- Encounter Discharge Information Details Patient Name: Date of Service: Jesus Genera. 05/06/2022 8:45 A M Medical Record Number: [id number] Patient Account Number: [id number] Date of Birth/Sex: Treating RN: 17-Apr-1965 (57 y.o. Charlean Merl, [name] Primary Care Stevee Valenta: [name] Other Clinician: Referring Brailen Macneal: Treating Taleya Whitcher/Extender:  Claria Dice in Treatment: 12 Encounter Discharge Information Items Post Procedure Vitals Discharge Condition: Stable Temperature (F): 98.7 Ambulatory Status: Ambulatory Pulse (bpm): 74 Discharge Destination: Home Respiratory Rate (breaths/min): 17 Transportation: Private Auto Blood Pressure (mmHg): 120/80 Accompanied By: self Schedule Follow-up Appointment: Yes Clinical Summary of Care: Patient Declined Electronic Signature(s) Signed: 05/07/2022 3:30:40 PM By: Fonnie Mu RN Entered By: Fonnie Mu on 05/06/2022 09:45:37 -------------------------------------------------------------------------------- Lower Extremity Assessment Details Patient Name: Date of Service: Jesus Genera 05/06/2022 8:45 A M Medical Record Number: [id number] Patient Account Number: [id number] Date of Birth/Sex: Treating RN: [birth year] (57 y.o. Charlean Merl, [name] Primary Care Kaelem Brach: [name] Other Clinician: Referring Micco Bourbeau: Treating Rilen Shukla/Extender: Tonita Phoenix Weeks in Treatment: 12 Edema Assessment Assessed: Kyra Searles: Yes] Franne Forts: No] R[LeftWayne Sever S ([id number])] [Right: [id number] Page 2 of 7] Edema: [Left: Ye] [Right: s] Calf Left: Right: Point of Measurement: 43 cm From Medial Instep 42 cm Ankle Left: Right: Point of Measurement: 11 cm From Medial Instep 28 cm Vascular Assessment Pulses: Dorsalis Pedis Palpable: [Left:Yes] Posterior Tibial Palpable: [Left:Yes] Electronic Signature(s) Signed: 05/07/2022 3:30:40 PM By: Fonnie Mu RN Entered By: Fonnie Mu on 05/06/2022 09:06:40 -------------------------------------------------------------------------------- Multi Wound Chart Details Patient Name: Date of Service: Jesus Genera 05/06/2022 8:45 A M Medical Record Number: [id number] Patient Account Number: [id number] Date of Birth/Sex: Treating  RN: Sep 09, 1965 (57 y.o. M) Primary Care Wendolyn Raso: [name] Other Clinician: Referring Rosabella Edgin: Treating Jazen Spraggins/Extender: Claria Dice in Treatment: 12 Vital Signs Height(in): 75 Pulse(bpm): 73 Weight(lbs): 220 Blood Pressure(mmHg): 144/85 Body Mass Index(BMI): 27.5 Temperature(F): 98.3 Respiratory Rate(breaths/min): 17 [2:Photos:] [N/A:N/A] Left, Plantar Foot N/A N/A Wound Location: Surgical Injury N/A N/A Wounding Event: Neuropathic Ulcer-Non Diabetic N/A N/A Primary Etiology: Lymphedema, Sleep Apnea, N/A N/A Comorbid History: Hypertension, Peripheral Venous Disease, Osteoarthritis, Neuropathy 01/14/2022 N/A N/A Date Acquired: 12 N/A N/A Weeks of Treatment: Open N/A N/A Wound Status: No N/A N/A Wound Recurrence: Yes N/A N/A Pending A mputation on Presentation: 0.2x0.2x0.2 N/A N/A Measurements L x  W x D (cm) 0.031 N/A N/A A (cm) : rea 0.006 N/A N/A Volume (cm) : 73.70% N/A N/A % Reduction in A rea: 75.00% N/A N/A % Reduction in Volume: Full Thickness Without Exposed N/A N/A Classification: Support Structures Medium N/A N/A Exudate Amount: Serosanguineous N/A N/A Exudate Type: red, brown N/A N/A Exudate Color: Distinct, outline attached N/A N/A Wound MarginAlice Rieger: Abdelrahman, Magnus S ([id number]) [id number] Page 3 of 7 Medium (34-66%) N/A N/A Granulation Amount: Red, Pink N/A N/A Granulation Quality: Medium (34-66%) N/A N/A Necrotic Amount: Fat Layer (Subcutaneous Tissue): Yes N/A N/A Exposed Structures: Fascia: No Tendon: No Muscle: No Joint: No Bone: No Large (67-100%) N/A N/A Epithelialization: Debridement - Selective/Open Wound N/A N/A Debridement: Pre-procedure Verification/Time Out 09:39 N/A N/A Taken: Lidocaine N/A N/A Pain Control: Callus, Slough N/A N/A Tissue Debrided: Skin/Epidermis N/A N/A Level: 0.25 N/A N/A Debridement A (sq cm): rea Curette N/A  N/A Instrument: Minimum N/A N/A Bleeding: Pressure N/A N/A Hemostasis A chieved: 0 N/A N/A Procedural Pain: 0 N/A N/A Post Procedural Pain: Procedure was tolerated well N/A N/A Debridement Treatment Response: 0.2x0.2x0.2 N/A N/A Post Debridement Measurements L x W x D (cm) 0.006 N/A N/A Post Debridement Volume: (cm) Callus: Yes N/A N/A Periwound Skin Texture: Excoriation: No Induration: No Crepitus: No Rash: No Scarring: No Maceration: No N/A N/A Periwound Skin Moisture: Dry/Scaly: No Atrophie Blanche: No N/A N/A Periwound Skin Color: Cyanosis: No Ecchymosis: No Erythema: No Hemosiderin Staining: No Mottled: No Pallor: No Rubor: No Debridement N/A N/A Procedures Performed: T Contact Cast otal Treatment Notes Wound #2 (Foot) Wound Laterality: Plantar, Left Cleanser Wound Cleanser Discharge Instruction: Cleanse the wound with wound cleanser prior to applying a clean dressing using gauze sponges, not tissue or cotton balls. Byram Ancillary Kit - 15 Day Supply Discharge Instruction: Use supplies as instructed; Kit contains: (15) Saline Bullets; (15) 3x3 Gauze; 15 pr Gloves Peri-Wound Care Topical Gentamicin Discharge Instruction: As directed by physician Mupirocin Ointment Discharge Instruction: Apply Mupirocin (Bactroban) as instructed Primary Dressing Aquacel Ag Advantage Hydrofiber Rope w/silver, 0.39x18 (in/in) Secondary Dressing Optifoam Non-Adhesive Dressing, 4x4 in Discharge Instruction: Apply x1 layer donut to aid in offloading. Woven Gauze Sponges 2x2 in Discharge Instruction: Apply over primary dressing as directed. Secured With Conforming Stretch Gauze Bandage, Sterile 2x75 (in/in) Discharge Instruction: Secure with stretch gauze as directed. 63M Medipore H Soft Cloth Surgical T ape, 4 x 10 (in/yd) Discharge Instruction: Secure with tape as directed. Compression Wrap Compression Stockings Alice RiegerRUSSELL, Myer S ([id number])  [id number] Page 4 of 7 Add-Ons Electronic Signature(s) Signed: 05/06/2022 12:40:48 PM By: Geralyn CorwinHoffman, Jessica DO Entered By: Geralyn CorwinHoffman, Jessica on 05/06/2022 09:47:37 -------------------------------------------------------------------------------- Multi-Disciplinary Care Plan Details Patient Name: Date of Service: Jesus GeneraRUSSELL, Kerrick S. 05/06/2022 8:45 A M Medical Record Number: [id number] Patient Account Number: [id number] Date of Birth/Sex: Treating RN: 03/23/1965 (57 y.o. Charlean MerlM) Breedlove, [name] Primary Care Rainen Vanrossum: Georgianne Fickamachandran, Ajith Other Clinician: Referring Shivangi Lutz: Treating Zephaniah Lubrano/Extender: Claria DiceHoffman, Jessica Ramachandran, Ajith Weeks in Treatment: 12 Active Inactive Peripheral Neuropathy Nursing Diagnoses: Potential alteration in peripheral tissue perfusion (select prior to confirmation of diagnosis) Goals: Patient/caregiver will verbalize understanding of disease process and disease management Date Initiated: 02/11/2022 Target Resolution Date: 05/30/2022 Goal Status: Active Interventions: Assess signs and symptoms of neuropathy upon admission and as needed Provide education on Management of Neuropathy upon discharge from the Wound Center Treatment Activities: Patient referred for customized footwear/offloading : 02/11/2022 Notes: Electronic Signature(s) Signed: 05/07/2022 3:30:40 PM By: Fonnie MuBreedlove, [name] RN Entered By: Fonnie MuBreedlove, [name] on 05/06/2022 09:40:43 -------------------------------------------------------------------------------- Pain Assessment Details Patient Name:  Date of Service: ELVIS, LAUFER 05/06/2022 8:45 A M Medical Record Number: [id number] Patient Account Number: [id number] Date of Birth/Sex: Treating RN: 1965-07-01 (57 y.o. Lucious Groves Primary Care Lucelia Lacey: [name] Other Clinician: Referring Miriana Gaertner: Treating Glanda Spanbauer/Extender: Tonita Phoenix Weeks in Treatment: 12 Active  Problems Location of Pain Severity and Description of Pain Patient Has Paino No Site Locations [place], [place] S ([id number]) [id number] Page 5 of 7 Pain Management and Medication Current Pain Management: Electronic Signature(s) Signed: 05/07/2022 3:30:40 PM By: Fonnie Mu RN Entered By: Fonnie Mu on 05/06/2022 09:06:34 -------------------------------------------------------------------------------- Patient/Caregiver Education Details Patient Name: Date of Service: Jesus Genera 4/9/2024andnbsp8:45 A M Medical Record Number: [id number] Patient Account Number: [id number] Date of Birth/Gender: Treating RN: 04/20/1965 (57 y.o. Lucious Groves Primary Care Physician: [name] Other Clinician: Referring Physician: Treating Physician/Extender: Claria Dice in Treatment: 12 Education Assessment Education Provided To: Patient Education Topics Provided Wound/Skin Impairment: Methods: Explain/Verbal Responses: Reinforcements needed, State content correctly Electronic Signature(s) Signed: 05/07/2022 3:30:40 PM By: Fonnie Mu RN Entered By: Fonnie Mu on 05/06/2022 09:40:53 -------------------------------------------------------------------------------- Wound Assessment Details Patient Name: Date of Service: Jesus Genera 05/06/2022 8:45 A M Medical Record Number: [id number] Patient Account Number: [id number] Date of Birth/Sex: Treating RN: [birth year] (57 y.o. Charlean Merl, [name] Primary Care Anguel Delapena: [name] Other Clinician: Referring Shanta Hartner: Treating Leeana Creer/Extender: Tonita Phoenix Weeks in Treatment: 12 Wound Status Wound Number: 2 Primary Neuropathic Ulcer-Non Diabetic Etiology: Wound Location: Left, Plantar Foot Wound Open Wounding Event: Surgical Injury Status: Date Acquired: 01/14/2022 Comorbid Lymphedema, Sleep Apnea,  Hypertension, Peripheral Venous Weeks Of Treatment: 12 History: Disease, Osteoarthritis, Neuropathy Clustered Wound: No Thoreson, Nachmen S ([id number]) [id number] Page 6 of 7 Pending Amputation On Presentation Photos Wound Measurements Length: (cm) 0.2 Width: (cm) 0.2 Depth: (cm) 0.2 Area: (cm) 0.031 Volume: (cm) 0.006 % Reduction in Area: 73.7% % Reduction in Volume: 75% Epithelialization: Large (67-100%) Tunneling: No Undermining: No Wound Description Classification: Full Thickness Without Exposed Support Structures Wound Margin: Distinct, outline attached Exudate Amount: Medium Exudate Type: Serosanguineous Exudate Color: red, brown Foul Odor After Cleansing: No Slough/Fibrino Yes Wound Bed Granulation Amount: Medium (34-66%) Exposed Structure Granulation Quality: Red, Pink Fascia Exposed: No Necrotic Amount: Medium (34-66%) Fat Layer (Subcutaneous Tissue) Exposed: Yes Necrotic Quality: Adherent Slough Tendon Exposed: No Muscle Exposed: No Joint Exposed: No Bone Exposed: No Periwound Skin Texture Texture Color No Abnormalities Noted: No No Abnormalities Noted: No Callus: Yes Atrophie Blanche: No Crepitus: No Cyanosis: No Excoriation: No Ecchymosis: No Induration: No Erythema: No Rash: No Hemosiderin Staining: No Scarring: No Mottled: No Pallor: No Moisture Rubor: No No Abnormalities Noted: No Dry / Scaly: No Maceration: No Treatment Notes Wound #2 (Foot) Wound Laterality: Plantar, Left Cleanser Wound Cleanser Discharge Instruction: Cleanse the wound with wound cleanser prior to applying a clean dressing using gauze sponges, not tissue or cotton balls. Byram Ancillary Kit - 15 Day Supply Discharge Instruction: Use supplies as instructed; Kit contains: (15) Saline Bullets; (15) 3x3 Gauze; 15 pr Gloves Peri-Wound Care Topical Gentamicin Discharge Instruction: As directed by physician Mupirocin Ointment Discharge  Instruction: Apply Mupirocin (Bactroban) as instructed Primary Dressing Aquacel Ag Advantage Hydrofiber Rope w/silver, 0.39x18 (in/in) Cales, Darean S ([id number]) [id number] Page 7 of 7 Secondary Dressing Optifoam Non-Adhesive Dressing, 4x4 in Discharge Instruction: Apply x1 layer donut to aid in offloading. Woven Gauze Sponges 2x2 in Discharge Instruction: Apply over primary dressing as directed. Secured With Conforming Stretch Gauze Bandage, Sterile 2x75 (in/in) Discharge Instruction: Secure with stretch  gauze as directed. 16M Medipore H Soft Cloth Surgical T ape, 4 x 10 (in/yd) Discharge Instruction: Secure with tape as directed. Compression Wrap Compression Stockings Add-Ons Electronic Signature(s) Signed: 05/07/2022 3:30:40 PM By: Fonnie Mu RN Entered By: Fonnie Mu on 05/06/2022 09:24:13 -------------------------------------------------------------------------------- Vitals Details Patient Name: Date of Service: Jesus Genera. 05/06/2022 8:45 A M Medical Record Number: [id number] Patient Account Number: [id number] Date of Birth/Sex: Treating RN: 1966-01-25 (57 y.o. Charlean Merl, [name] Primary Care Markice Torbert: [name] Other Clinician: Referring Lark Langenfeld: Treating Geddy Boydstun/Extender: Claria Dice in Treatment: 12 Vital Signs Time Taken: 09:08 Temperature (F): 98.3 Height (in): 75 Pulse (bpm): 73 Weight (lbs): 220 Respiratory Rate (breaths/min): 17 Body Mass Index (BMI): 27.5 Blood Pressure (mmHg): 144/85 Reference Range: 80 - 120 mg / dl Electronic Signature(s) Signed: 05/07/2022 3:30:40 PM By: Fonnie Mu RN Entered By: Fonnie Mu on 05/06/2022 09:09:37

## 2022-05-07 NOTE — Progress Notes (Signed)
[name] ([id number]) [id number] Page 1 of 8 Visit Report for 05/06/2022 Chief Complaint Document Details Patient Name: Date of Service: [name], [name] 05/06/2022 8:45 A M Medical Record Number: [id number] Patient Account Number: [id number] Date of Birth/Sex: Treating RN: 1965-12-27 (57 y.o. M) Primary Care Provider: [name] Other Clinician: Referring Provider: Treating Provider/Extender: Claria Dice in Treatment: 12 Information Obtained from: Patient Chief Complaint 02/11/2022 left great toe wound and fourth sub-met head wound Electronic Signature([name]) Signed: 05/06/2022 12:40:48 PM By: Geralyn Corwin DO Entered By: Geralyn Corwin on 05/06/2022 09:47:45 -------------------------------------------------------------------------------- Debridement Details Patient Name: Date of Service: [name]. 05/06/2022 8:45 A M Medical Record Number: [id number] Patient Account Number: [id number] Date of Birth/Sex: Treating RN: Aug 04, 1965 (57 y.o. [name], [name] Primary Care Provider: [name] Other Clinician: Referring Provider: Treating Provider/Extender: Claria Dice in Treatment: 12 Debridement Performed for Assessment: Wound #2 Left,Plantar Foot Performed By: Physician Geralyn Corwin, DO Debridement Type: Debridement Level of Consciousness (Pre-procedure): Awake and Alert Pre-procedure Verification/Time Out Yes - 09:39 Taken: Start Time: 09:39 Pain Control: Lidocaine T Area Debrided (L x W): otal 0.5 (cm) x 0.5 (cm) = 0.25 (cm) Tissue and other material debrided: Viable, Non-Viable, Callus, Slough, Skin: Dermis , Skin: Epidermis, Slough Level: Skin/Epidermis Debridement Description: Selective/Open Wound Instrument: Curette Bleeding: Minimum Hemostasis Achieved: Pressure End Time: 09:39 Procedural Pain: 0 Post Procedural Pain: 0 Response to  Treatment: Procedure was tolerated well Level of Consciousness (Post- Awake and Alert procedure): Post Debridement Measurements of Total Wound Length: (cm) 0.2 Width: (cm) 0.2 Depth: (cm) 0.2 Volume: (cm) 0.006 Character of Wound/Ulcer Post Debridement: Improved Post Procedure Diagnosis Same as Pre-procedure Electronic Signature([name]) Signed: 05/06/2022 12:40:48 PM By: Geralyn Corwin DO Signed: 05/07/2022 3:30:40 PM By: Fonnie Mu RN Entered By: Fonnie Mu on 05/06/2022 09:40:23 Skillman, [name] ([id number]) [id number] Page 2 of 8 -------------------------------------------------------------------------------- HPI Details Patient Name: Date of Service: [name], [name] 05/06/2022 8:45 A M Medical Record Number: [id number] Patient Account Number: [id number] Date of Birth/Sex: Treating RN: 1965/05/15 (57 y.o. M) Primary Care Provider: [name] Other Clinician: Referring Provider: Treating Provider/Extender: Claria Dice in Treatment: 12 History of Present Illness HPI Description: 02/11/2022 [name] is a 57 year old male with a past medical history of current tobacco user, OSA, venous insufficiency and peripheral neuropathy that presents the clinic for a 2-week history of ulcer to the left great toe and 2-17-month history of nonhealing ulcer to the left plantar foot. 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 He wears steel toed boots. He is currently out of work due to his wounds. On 11/26/2021 he was admitted to the hospital for cellulitis and abscess to the left foot. 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 He states he has been on doxycycline for 10 weeks. He just completed this course. He uses regular tennis shoes for walking. He is not offloading the area. He is using Betadine for wound dressings. He has been following with Dr. Allena Katz, podiatry who  Referred to our clinic. 1/23; patient presents for follow-up. He has been using Medihoney and Hydrofera Blue to the wound beds Along with his surgical shoe and offloading peg assist. He has no issues or complaints today. He denies signs of infection. 1/30; patient presents for follow-up. He has been using Medihoney and Hydrofera Blue to the wound beds. He has no issues or complaints today. Plan is for the total contact cast and  patient is agreeable to move forward with this. 2/1; patient presents for follow-up. We have been using antibiotic ointment with Hydrofera Blue to the wound beds under the total contact cast. He presents today for his obligatory cast change. He had no issues with the cast. 2/6; patient presents for follow-up. We have been using antibiotic ointment with Hydrofera Blue to the wound beds under the total contact cast. He has no issues or complaints today. He has tolerated the cast well. 2/13; patient presents for follow-up. We have been using the total contact cast to the left lower extremity. We have been using Hydrofera Blue and antibiotic ointment to the wound beds. He has no issues or complaints today. 2/20; patient presents for follow-up. We were using the total contact cast along with Hydrofera Blue and antibiotic ointment. He states the cast was slightly uncomfortable over the past week. 2/27; Patient presents for follow-up. We have been using Hydrofera Blue with antibiotic ointment under the total contact cast. He tolerated the cast well and has no issues or complaints today. 3/5; Patient presents for follow-up. We have been using Hydrofera Blue with antibiotic ointment under the total contact cast. He tolerated the cast well and has no issues or complaints today. 3/13; patient presents for follow-up. We have been using Hydrofera Blue with collagen to the left plantar foot wound and Hydrofera Blue with antibiotic ointment to the left toe wound all under the total contact  cast. He had no issues or complaints today. 3/19; patient presents for follow-up. We have been using Hydrofera Blue with collagen to the left plantar foot wound and antibiotic ointment with Hydrofera Blue to the left great toe wound. All under the total contact cast. The toe wound is healed. 3/26; patient presents for follow-up. We have been using Hydrofera Blue and collagen to the plantar foot wound. The left great toe wound remains healed. We have been doing this under the total contact cast. 04/29/2022: There is just a small open area at the base of the wound, which is otherwise clean. His periwound skin is fairly macerated. 4/9; patient presents for follow-up. We have been using Hydrofera Blue and antibiotic ointment under the total contact cast. He is tolerated the cast well. He has no issues or complaints today. Electronic Signature([name]) Signed: 05/06/2022 12:40:48 PM By: Geralyn Corwin DO Entered By: Geralyn Corwin on 05/06/2022 10:15:52 -------------------------------------------------------------------------------- Physical Exam Details Patient Name: Date of Service: [name], [name] 05/06/2022 8:45 A M Medical Record Number: [id number] Patient Account Number: [id number] Date of Birth/Sex: Treating RN: Mar 21, 1965 (57 y.o. M) Primary Care Provider: [name] Other Clinician: Referring Provider: Treating Provider/Extender: Claria Dice in Treatment: 12 Constitutional respirations regular, non-labored and within target range for patient.. Cardiovascular 2+ dorsalis pedis/posterior tibialis pulses. UNDRA, HARRIMAN ([id number]) [id number] Page 3 of 8 Psychiatric pleasant and cooperative. Notes On the plantar aspect of the left foot there is an open wound with callus, devitalized tissue and slough. No signs of surrounding infection. Electronic Signature([name]) Signed: 05/06/2022 12:40:48 PM By: Geralyn Corwin  DO Entered By: Geralyn Corwin on 05/06/2022 10:16:58 -------------------------------------------------------------------------------- Physician Orders Details Patient Name: Date of Service: [name] 05/06/2022 8:45 A M Medical Record Number: [id number] Patient Account Number: [id number] Date of Birth/Sex: Treating RN: 1965-09-01 (57 y.o. Lucious Groves Primary Care Provider: [name] Other Clinician: Referring Provider: Treating Provider/Extender: Claria Dice in Treatment: 12 Verbal / Phone Orders: No Diagnosis Coding Follow-up Appointments ppointment in 1 week. - Dr. Mikey Bussing  and Merla Riches # 9 Tuesday 05/13/22 @ 9:30 Return A Anesthetic (In clinic) Topical Lidocaine 4% applied to wound bed Bathing/ Shower/ Hygiene May shower with protection but do not get wound dressing([name]) wet. Protect dressing([name]) with water repellant cover (for example, large plastic bag) or a cast cover and may then take shower. Edema Control - Lymphedema / SCD / Other Elevate legs to the level of the heart or above for 30 minutes daily and/or when sitting for 3-4 times a day throughout the day. Avoid standing for long periods of time. Off-Loading Total Contact Cast to Left Lower Extremity - Size 4 apply zinc between toes and any moisture noted areas on foot. Wound Treatment Wound #2 - Foot Wound Laterality: Plantar, Left Cleanser: Wound Cleanser 1 x Per Week/30 Days Discharge Instructions: Cleanse the wound with wound cleanser prior to applying a clean dressing using gauze sponges, not tissue or cotton balls. Cleanser: Byram Ancillary Kit - 15 Day Supply (Generic) 1 x Per Week/30 Days Discharge Instructions: Use supplies as instructed; Kit contains: (15) Saline Bullets; (15) 3x3 Gauze; 15 pr Gloves Topical: Gentamicin 1 x Per Week/30 Days Discharge Instructions: As directed by physician Topical: Mupirocin Ointment 1 x Per Week/30 Days Discharge  Instructions: Apply Mupirocin (Bactroban) as instructed Prim Dressing: Aquacel Ag Advantage Hydrofiber Rope w/silver, 0.39x18 (in/in) ary 1 x Per Week/30 Days Secondary Dressing: Optifoam Non-Adhesive Dressing, 4x4 in (Generic) 1 x Per Week/30 Days Discharge Instructions: Apply x1 layer donut to aid in offloading. Secondary Dressing: Woven Gauze Sponges 2x2 in (Generic) 1 x Per Week/30 Days Discharge Instructions: Apply over primary dressing as directed. Secured With: Insurance underwriter, Sterile 2x75 (in/in) (Generic) 1 x Per Week/30 Days Discharge Instructions: Secure with stretch gauze as directed. Secured With: 32M Medipore H Soft Cloth Surgical T ape, 4 x 10 (in/yd) (Generic) 1 x Per Week/30 Days Discharge Instructions: Secure with tape as directed. Electronic Signature([name]) Signed: 05/06/2022 12:40:48 PM By: Darrall Dears, Forest Becker ([id number]) [id number] Page 4 of 8 Entered By: Geralyn Corwin on 05/06/2022 10:17:06 -------------------------------------------------------------------------------- Problem List Details Patient Name: Date of Service: [name], [name] 05/06/2022 8:45 A M Medical Record Number: [id number] Patient Account Number: [id number] Date of Birth/Sex: Treating RN: 1965-05-17 (57 y.o. M) Primary Care Provider: [name] Other Clinician: Referring Provider: Treating Provider/Extender: Claria Dice in Treatment: 12 Active Problems ICD-10 Encounter Code Description Active Date MDM Diagnosis [phone] Non-pressure chronic ulcer of other part of 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 02/11/2022 No Yes G90.09 Other idiopathic peripheral autonomic neuropathy 02/11/2022 No Yes G47.33 Obstructive sleep apnea (adult) (pediatric) 02/11/2022 No Yes I87.2 Venous insufficiency (chronic) (peripheral) 02/11/2022 No Yes Inactive Problems Resolved Problems Electronic Signature([name]) Signed:  05/06/2022 12:40:48 PM By: Geralyn Corwin DO Entered By: Geralyn Corwin on 05/06/2022 09:47:31 -------------------------------------------------------------------------------- Progress Note Details Patient Name: Date of Service: [name] 05/06/2022 8:45 A M Medical Record Number: [id number] Patient Account Number: [id number] Date of Birth/Sex: Treating RN: 05-22-1965 (57 y.o. M) Primary Care Provider: [name] Other Clinician: Referring Provider: Treating Provider/Extender: Claria Dice in Treatment: 12 Subjective Chief Complaint Information obtained from Patient 02/11/2022 left great toe wound and fourth sub-met head wound History of Present Illness (HPI) 02/11/2022 [name] is a 57 year old male with a past medical history of current tobacco user, OSA, venous insufficiency and peripheral neuropathy that presents the clinic for a 2-week history of ulcer to the left great toe and 2-78-month history of nonhealing ulcer to the  left plantar foot. 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 He wears steel toed boots. He is currently out of work due to his wounds. On 11/26/2021 he was admitted to the hospital for cellulitis and abscess to the left foot. 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 He states he has been on doxycycline for 10 weeks. He just completed this course. He uses regular tennis shoes for walking. He is not offloading the area. He is using Betadine for wound dressings. He has been following with Dr. Allena Katz, podiatry who Referred to our clinic. 1/23; patient presents for follow-up. He has been using Medihoney and Hydrofera Blue to the wound beds Along with his surgical shoe and offloading peg assist. He has no issues or complaints today. He denies signs of infection. 1/30; patient presents for follow-up. He has been using Medihoney and Hydrofera Blue to the wound beds. He has no  issues or complaints today. Plan is for the total contact cast and patient is agreeable to move forward with this. [name], [name] ([id number]) [id number] Page 5 of 8 2/1; patient presents for follow-up. We have been using antibiotic ointment with Hydrofera Blue to the wound beds under the total contact cast. He presents today for his obligatory cast change. He had no issues with the cast. 2/6; patient presents for follow-up. We have been using antibiotic ointment with Hydrofera Blue to the wound beds under the total contact cast. He has no issues or complaints today. He has tolerated the cast well. 2/13; patient presents for follow-up. We have been using the total contact cast to the left lower extremity. We have been using Hydrofera Blue and antibiotic ointment to the wound beds. He has no issues or complaints today. 2/20; patient presents for follow-up. We were using the total contact cast along with Hydrofera Blue and antibiotic ointment. He states the cast was slightly uncomfortable over the past week. 2/27; Patient presents for follow-up. We have been using Hydrofera Blue with antibiotic ointment under the total contact cast. He tolerated the cast well and has no issues or complaints today. 3/5; Patient presents for follow-up. We have been using Hydrofera Blue with antibiotic ointment under the total contact cast. He tolerated the cast well and has no issues or complaints today. 3/13; patient presents for follow-up. We have been using Hydrofera Blue with collagen to the left plantar foot wound and Hydrofera Blue with antibiotic ointment to the left toe wound all under the total contact cast. He had no issues or complaints today. 3/19; patient presents for follow-up. We have been using Hydrofera Blue with collagen to the left plantar foot wound and antibiotic ointment with Hydrofera Blue to the left great toe wound. All under the total contact cast. The toe wound  is healed. 3/26; patient presents for follow-up. We have been using Hydrofera Blue and collagen to the plantar foot wound. The left great toe wound remains healed. We have been doing this under the total contact cast. 04/29/2022: There is just a small open area at the base of the wound, which is otherwise clean. His periwound skin is fairly macerated. 4/9; patient presents for follow-up. We have been using Hydrofera Blue and antibiotic ointment under the total contact cast. He is tolerated the cast well. He has no issues or complaints today. Patient History Family History Heart Disease - Mother, Lung Disease - Mother. Social History Current every day smoker - 10 cig a day, Alcohol Use -  Never, Drug Use - No History, Caffeine Use - Never. Medical History Hematologic/Lymphatic Patient has history of Lymphedema - pumps use daily Respiratory Patient has history of Sleep Apnea - CPAP Cardiovascular Patient has history of Hypertension, Peripheral Venous Disease - needs ablation on left leg Musculoskeletal Patient has history of Osteoarthritis Denies history of Osteomyelitis Neurologic Patient has history of Neuropathy Hospitalization/Surgery History - 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. Medical A Surgical History Notes nd Musculoskeletal abscess to left foot 3rd toe 11/28/2022 Objective Constitutional respirations regular, non-labored and within target range for patient.. Vitals Time Taken: 9:08 AM, Height: 75 in, Weight: 220 lbs, BMI: 27.5, Temperature: 98.3 F, Pulse: 73 bpm, Respiratory Rate: 17 breaths/min, Blood Pressure: 144/85 mmHg. Cardiovascular 2+ dorsalis pedis/posterior tibialis pulses. Psychiatric pleasant and cooperative. General Notes: On the plantar aspect of the left foot there is an open wound with callus, devitalized tissue and slough. No signs of surrounding infection. Integumentary (Hair, Skin) Wound #2 status is Open. Original cause of wound was  Surgical Injury. The date acquired was: 01/14/2022. The wound has been in treatment 12 weeks. The wound is located on the Left,Plantar Foot. The wound measures 0.2cm length x 0.2cm width x 0.2cm depth; 0.031cm^2 area and 0.006cm^3 volume. There is [name], [name] ([id number]) [id number] Page 6 of 8 Fat Layer (Subcutaneous Tissue) exposed. There is no tunneling or undermining noted. There is a medium amount of serosanguineous drainage noted. The wound margin is distinct with the outline attached to the wound base. There is medium (34-66%) red, pink granulation within the wound bed. There is a medium (34-66%) amount of necrotic tissue within the wound bed including Adherent Slough. The periwound skin appearance exhibited: Callus. The periwound skin appearance did not exhibit: Crepitus, Excoriation, Induration, Rash, Scarring, Dry/Scaly, Maceration, Atrophie Blanche, Cyanosis, Ecchymosis, Hemosiderin Staining, Mottled, Pallor, Rubor, Erythema. Assessment Active Problems ICD-10 Non-pressure chronic ulcer of other part of 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 Other idiopathic peripheral autonomic neuropathy Obstructive sleep apnea (adult) (pediatric) Venous insufficiency (chronic) (peripheral) Patient'[name] wound appears well-healing. I debrided nonviable tissue. I recommended switching the dressing from Hydrofera Blue to silver alginate as the periwound was slightly macerated. Continue the total contact cast. Follow-up in 1 week. Procedures Wound #2 Pre-procedure diagnosis of Wound #2 is a Neuropathic Ulcer-Non Diabetic located on the Left,Plantar Foot . There was a Selective/Open Wound Skin/Epidermis Debridement with a total area of 0.25 sq cm performed by Geralyn Corwin, DO. With the following instrument([name]): Curette to remove Viable and Non-Viable tissue/material. Material removed includes Callus, Slough, Skin: Dermis, and Skin: Epidermis after achieving pain control using  Lidocaine. No specimens were taken. A time out was conducted at 09:39, prior to the start of the procedure. A Minimum amount of bleeding was controlled with Pressure. The procedure was tolerated well with a pain level of 0 throughout and a pain level of 0 following the procedure. Post Debridement Measurements: 0.2cm length x 0.2cm width x 0.2cm depth; 0.006cm^3 volume. Character of Wound/Ulcer Post Debridement is improved. Post procedure Diagnosis Wound #2: Same as Pre-Procedure Pre-procedure diagnosis of Wound #2 is a Neuropathic Ulcer-Non Diabetic located on the Left,Plantar Foot . There was a T Research scientist (life sciences) Procedure by Roselie Awkward, Deloris Mittag, DO. Post procedure Diagnosis Wound #2: Same as Pre-Procedure Plan Follow-up Appointments: Return Appointment in 1 week. - Dr. Mikey Bussing and Maryruth Bun Rm # 9 Tuesday 05/13/22 @ 9:30 Anesthetic: (In clinic) Topical Lidocaine 4% applied to wound bed Bathing/ Shower/ Hygiene: May shower with protection but do not get  wound dressing([name]) wet. Protect dressing([name]) with water repellant cover (for example, large plastic bag) or a cast cover and may then take shower. Edema Control - Lymphedema / SCD / Other: Elevate legs to the level of the heart or above for 30 minutes daily and/or when sitting for 3-4 times a day throughout the day. Avoid standing for long periods of time. Off-Loading: T Contact Cast to Left Lower Extremity - Size 4 apply zinc between toes and any moisture noted areas on foot. otal WOUND #2: - Foot Wound Laterality: Plantar, Left Cleanser: Wound Cleanser 1 x Per Week/30 Days Discharge Instructions: Cleanse the wound with wound cleanser prior to applying a clean dressing using gauze sponges, not tissue or cotton balls. Cleanser: Byram Ancillary Kit - 15 Day Supply (Generic) 1 x Per Week/30 Days Discharge Instructions: Use supplies as instructed; Kit contains: (15) Saline Bullets; (15) 3x3 Gauze; 15 pr Gloves Topical: Gentamicin 1 x Per Week/30  Days Discharge Instructions: As directed by physician Topical: Mupirocin Ointment 1 x Per Week/30 Days Discharge Instructions: Apply Mupirocin (Bactroban) as instructed Prim Dressing: Aquacel Ag Advantage Hydrofiber Rope w/silver, 0.39x18 (in/in) 1 x Per Week/30 Days ary Secondary Dressing: Optifoam Non-Adhesive Dressing, 4x4 in (Generic) 1 x Per Week/30 Days Discharge Instructions: Apply x1 layer donut to aid in offloading. Secondary Dressing: Woven Gauze Sponges 2x2 in (Generic) 1 x Per Week/30 Days Discharge Instructions: Apply over primary dressing as directed. Secured With: Insurance underwriterConforming Stretch Gauze Bandage, Sterile 2x75 (in/in) (Generic) 1 x Per Week/30 Days Discharge Instructions: Secure with stretch gauze as directed. Secured With: 42M Medipore H Soft Cloth Surgical T ape, 4 x 10 (in/yd) (Generic) 1 x Per Week/30 Days Discharge Instructions: Secure with tape as directed. 1. In office sharp debridement 2. T contact cast placed in standard fashion otal 3. Silver alginate with antibiotic ointment John GeneraRUSSELL, Xavier [name] ([id number]) [id number] Page 7 of 8 4. Follow-up in 1 week Electronic Signature([name]) Signed: 05/06/2022 12:40:48 PM By: Geralyn CorwinHoffman, Anurag Scarfo DO Entered By: Geralyn CorwinHoffman, Nataniel Gasper on 05/06/2022 10:18:46 -------------------------------------------------------------------------------- HxROS Details Patient Name: Date of Service: John GeneraUSSELL, Sevag [name]. 05/06/2022 8:45 A M Medical Record Number: [id number] Patient Account Number: [id number] Date of Birth/Sex: Treating RN: 10/06/1965 (57 y.o. M) Primary Care Provider: Georgianne Fickamachandran, Ajith Other Clinician: Referring Provider: Treating Provider/Extender: Claria DiceHoffman, Matia Zelada Ramachandran, Ajith Weeks in Treatment: 12 Hematologic/Lymphatic Medical History: Positive for: Lymphedema - pumps use daily Respiratory Medical History: Positive for: Sleep Apnea - CPAP Cardiovascular Medical History: Positive for: Hypertension;  Peripheral Venous Disease - needs ablation on left leg Musculoskeletal Medical History: Positive for: Osteoarthritis Negative for: Osteomyelitis Past Medical History Notes: abscess to left foot 3rd toe 11/28/2022 Neurologic Medical History: Positive for: Neuropathy Immunizations Implantable Devices No devices added Hospitalization / Surgery History Type of Hospitalization/Surgery 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 Family and Social History Heart Disease: Yes - Mother; Lung Disease: Yes - Mother; Current every day smoker - 10 cig a day; Alcohol Use: Never; Drug Use: No History; Caffeine Use: Never; Financial Concerns: No; Food, Clothing or Shelter Needs: No; Support System Lacking: No; Transportation Concerns: No Electronic Signature([name]) Signed: 05/06/2022 12:40:48 PM By: Geralyn CorwinHoffman, Znya Albino DO Entered By: Geralyn CorwinHoffman, [place] Ke on 05/06/2022 10:16:11 -------------------------------------------------------------------------------- Total Contact Cast Details Patient Name: Date of Service: John GeneraRUSSELL, Taiga [name]. 05/06/2022 8:45 A M Medical Record Number: [id number] Patient Account Number: [id number] Date of Birth/Sex: Treating RN: 07/07/1965 (57 y.o. Lucious GrovesM) Breedlove, [name] Primary Care Provider: Georgianne Fickamachandran, Ajith Other Clinician: Jesus GeneraRUSSELL, Tsutomu [name] ([id number]) [id number] Page 8 of 8 Referring Provider: Treating Provider/Extender: Mikey BussingHoffman,  Berneice Gandy, Ajith Weeks in Treatment: 12 T Contact Cast Applied for Wound Assessment: otal Wound #2 Left,Plantar Foot Performed By: Physician Geralyn Corwin, DO Post Procedure Diagnosis Same as Pre-procedure Electronic Signature([name]) Signed: 05/06/2022 12:40:48 PM By: Geralyn Corwin DO Signed: 05/07/2022 3:30:40 PM By: Fonnie Mu RN Entered By: Fonnie Mu on 05/06/2022 09:41:42 -------------------------------------------------------------------------------- SuperBill Details Patient Name: Date of  Service: [name] 05/06/2022 Medical Record Number: [id number] Patient Account Number: [id number] Date of Birth/Sex: Treating RN: Nov 06, 1965 (57 y.o. 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

## 2022-05-13 ENCOUNTER — Encounter (HOSPITAL_BASED_OUTPATIENT_CLINIC_OR_DEPARTMENT_OTHER): Payer: BC Managed Care – PPO | Admitting: Internal Medicine

## 2022-05-13 DIAGNOSIS — I10 Essential (primary) hypertension: Secondary | ICD-10-CM | POA: Diagnosis not present

## 2022-05-13 DIAGNOSIS — G4733 Obstructive sleep apnea (adult) (pediatric): Secondary | ICD-10-CM | POA: Diagnosis not present

## 2022-05-13 DIAGNOSIS — L97522 Non-pressure chronic ulcer of other part of 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: Secondary | ICD-10-CM | POA: Diagnosis not present

## 2022-05-13 DIAGNOSIS — G9009 Other idiopathic peripheral autonomic neuropathy: Secondary | ICD-10-CM

## 2022-05-13 DIAGNOSIS — I872 Venous insufficiency (chronic) (peripheral): Secondary | ICD-10-CM | POA: Diagnosis not present

## 2022-05-13 DIAGNOSIS — M1990 Unspecified osteoarthritis, unspecified site: Secondary | ICD-10-CM | POA: Diagnosis not present

## 2022-05-13 DIAGNOSIS — L02612 Cutaneous abscess of left foot: Secondary | ICD-10-CM | POA: Diagnosis not present

## 2022-05-13 NOTE — Progress Notes (Signed)
[name], [name] ([id number]) [id number] Page 1 of 8 Visit Report for 05/13/2022 Chief Complaint Document Details Patient Name: Date of Service: [name], [name] 05/13/2022 9:30 A M Medical Record Number: [id number] Patient Account Number: [id number] Date of Birth/Sex: Treating RN: 04-25-1965 (57 y.o. M) Primary Care Provider: [name] Other Clinician: Referring Provider: Treating Provider/Extender: Claria Dice in Treatment: 13 Information Obtained from: Patient Chief Complaint 02/11/2022 left great toe wound and fourth sub-met head wound Electronic Signature([name]) Signed: 05/13/2022 10:18:30 AM By: Geralyn Corwin DO Entered By: Geralyn Corwin on 05/13/2022 10:14:42 -------------------------------------------------------------------------------- HPI Details Patient Name: Date of Service: [name]. 05/13/2022 9:30 A M Medical Record Number: [id number] Patient Account Number: [id number] Date of Birth/Sex: Treating RN: 1965/07/12 (57 y.o. M) Primary Care Provider: [name] Other Clinician: Referring Provider: Treating Provider/Extender: Claria Dice in Treatment: 13 History of Present Illness HPI Description: 02/11/2022 [name] is a 57 year old male with a past medical history of current tobacco user, OSA, venous insufficiency and peripheral neuropathy that presents the clinic for a 2-week history of ulcer to the left great toe and 2-92-month history of nonhealing ulcer to the left plantar foot. 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 He wears steel toed boots. He is currently out of work due to his wounds. On 11/26/2021 he was admitted to the hospital for cellulitis and abscess to the left foot. 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 He states he has been on doxycycline for 10 weeks. He just completed this  course. He uses regular tennis shoes for walking. He is not offloading the area. He is using Betadine for wound dressings. He has been following with Dr. Allena Katz, podiatry who Referred to our clinic. 1/23; patient presents for follow-up. He has been using Medihoney and Hydrofera Blue to the wound beds Along with his surgical shoe and offloading peg assist. He has no issues or complaints today. He denies signs of infection. 1/30; patient presents for follow-up. He has been using Medihoney and Hydrofera Blue to the wound beds. He has no issues or complaints today. Plan is for the total contact cast and patient is agreeable to move forward with this. 2/1; patient presents for follow-up. We have been using antibiotic ointment with Hydrofera Blue to the wound beds under the total contact cast. He presents today for his obligatory cast change. He had no issues with the cast. 2/6; patient presents for follow-up. We have been using antibiotic ointment with Hydrofera Blue to the wound beds under the total contact cast. He has no issues or complaints today. He has tolerated the cast well. 2/13; patient presents for follow-up. We have been using the total contact cast to the left lower extremity. We have been using Hydrofera Blue and antibiotic ointment to the wound beds. He has no issues or complaints today. 2/20; patient presents for follow-up. We were using the total contact cast along with Hydrofera Blue and antibiotic ointment. He states the cast was slightly uncomfortable over the past week. 2/27; Patient presents for follow-up. We have been using Hydrofera Blue with antibiotic ointment under the total contact cast. He tolerated the cast well and has no issues or complaints today. 3/5; Patient presents for follow-up. We have been using Hydrofera Blue with antibiotic ointment under the total contact cast. He tolerated the cast well and has no issues or complaints today. 3/13; patient presents for  follow-up. We have been using Hydrofera  Blue with collagen to the left plantar foot wound and Hydrofera Blue with antibiotic ointment to the left toe wound all under the total contact cast. He had no issues or complaints today. 3/19; patient presents for follow-up. We have been using Hydrofera Blue with collagen to the left plantar foot wound and antibiotic ointment with Hydrofera Blue to the left great toe wound. All under the total contact cast. The toe wound is healed. 3/26; patient presents for follow-up. We have been using Hydrofera Blue and collagen to the plantar foot wound. The left great toe wound remains healed. We have been doing this under the total contact cast. 04/29/2022: There is just a small open area at the base of the wound, which is otherwise clean. His periwound skin is fairly macerated. [name], [name] ([id number]) [id number] Page 2 of 8 4/9; patient presents for follow-up. We have been using Hydrofera Blue and antibiotic ointment under the total contact cast. He is tolerated the cast well. He has no issues or complaints today. 4/16; patient presents for follow-up. We have been using silver alginate with antibiotic ointment under the total contact cast. He has tolerated this well. The wound is well-healing. Electronic Signature([name]) Signed: 05/13/2022 10:18:30 AM By: Geralyn Corwin DO Entered By: Geralyn Corwin on 05/13/2022 10:16:08 -------------------------------------------------------------------------------- Physical Exam Details Patient Name: Date of Service: [name], [name] 05/13/2022 9:30 A M Medical Record Number: [id number] Patient Account Number: [id number] Date of Birth/Sex: Treating RN: 12/15/1965 (57 y.o. M) Primary Care Provider: [name] Other Clinician: Referring Provider: Treating Provider/Extender: Claria Dice in Treatment: 13 Constitutional respirations regular, non-labored  and within target range for patient.. Cardiovascular 2+ dorsalis pedis/posterior tibialis pulses. Psychiatric pleasant and cooperative. Notes On the plantar aspect of the left foot there is an open wound With granulation tissue throughout. No signs of infection. Electronic Signature([name]) Signed: 05/13/2022 10:18:30 AM By: Geralyn Corwin DO Entered By: Geralyn Corwin on 05/13/2022 10:17:00 -------------------------------------------------------------------------------- Physician Orders Details Patient Name: Date of Service: [name]. 05/13/2022 9:30 A M Medical Record Number: [id number] Patient Account Number: [id number] Date of Birth/Sex: Treating RN: [birth year] (57 y.o. Charlean Merl, [name] Primary Care Provider: [name] Other Clinician: Referring Provider: Treating Provider/Extender: Claria Dice in Treatment: 12 Verbal / Phone Orders: No Diagnosis Coding Follow-up Appointments ppointment in 1 week. - Dr. Mikey Bussing and Maryruth Bun Rm # 9 Tuesday 05/20/22 @ 10:15 Return A Anesthetic (In clinic) Topical Lidocaine 4% applied to wound bed Bathing/ Shower/ Hygiene May shower with protection but do not get wound dressing([name]) wet. Protect dressing([name]) with water repellant cover (for example, large plastic bag) or a cast cover and may then take shower. Edema Control - Lymphedema / SCD / Other Elevate legs to the level of the heart or above for 30 minutes daily and/or when sitting for 3-4 times a day throughout the day. Avoid standing for long periods of time. Off-Loading Total Contact Cast to Left Lower Extremity - Size 4 apply zinc between toes and any moisture noted areas on foot. Wound Treatment Wound #2 - Foot Wound Laterality: Plantar, Left Cleanser: Wound Cleanser 1 x Per Week/30 Days KAYVEON, LENNARTZ [name] ([id number]) [id number] Page 3 of 8 Discharge Instructions: Cleanse the wound with wound cleanser prior to  applying a clean dressing using gauze sponges, not tissue or cotton balls. Cleanser: Byram Ancillary Kit - 15 Day Supply (Generic) 1 x Per Week/30 Days Discharge Instructions: Use supplies as instructed; Kit contains: (15) Saline Bullets; (15) 3x3 Gauze; 15 pr Gloves  Topical: Gentamicin 1 x Per Week/30 Days Discharge Instructions: As directed by physician Topical: Mupirocin Ointment 1 x Per Week/30 Days Discharge Instructions: Apply Mupirocin (Bactroban) as instructed Prim Dressing: Aquacel Ag Advantage Hydrofiber Rope w/silver, 0.39x18 (in/in) ary 1 x Per Week/30 Days Secondary Dressing: Optifoam Non-Adhesive Dressing, 4x4 in (Generic) 1 x Per Week/30 Days Discharge Instructions: Apply x1 layer donut to aid in offloading. Secondary Dressing: Woven Gauze Sponges 2x2 in (Generic) 1 x Per Week/30 Days Discharge Instructions: Apply over primary dressing as directed. Secured With: Insurance underwriter, Sterile 2x75 (in/in) (Generic) 1 x Per Week/30 Days Discharge Instructions: Secure with stretch gauze as directed. Secured With: 35M Medipore H Soft Cloth Surgical T ape, 4 x 10 (in/yd) (Generic) 1 x Per Week/30 Days Discharge Instructions: Secure with tape as directed. Electronic Signature([name]) Signed: 05/13/2022 10:18:30 AM By: Geralyn Corwin DO Entered By: Geralyn Corwin on 05/13/2022 10:17:07 -------------------------------------------------------------------------------- Problem List Details Patient Name: Date of Service: [name]. 05/13/2022 9:30 A M Medical Record Number: [id number] Patient Account Number: [id number] Date of Birth/Sex: Treating RN: 1965/08/14 (57 y.o. M) Primary Care Provider: [name] Other Clinician: Referring Provider: Treating Provider/Extender: Claria Dice in Treatment: 13 Active Problems ICD-10 Encounter Code Description Active Date MDM Diagnosis [phone] Non-pressure chronic ulcer of other part of  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 02/11/2022 No Yes G90.09 Other idiopathic peripheral autonomic neuropathy 02/11/2022 No Yes G47.33 Obstructive sleep apnea (adult) (pediatric) 02/11/2022 No Yes I87.2 Venous insufficiency (chronic) (peripheral) 02/11/2022 No Yes Inactive Problems Resolved Problems Electronic Signature([name]) Signed: 05/13/2022 10:18:30 AM By: Geralyn Corwin DO Entered By: Geralyn Corwin on 05/13/2022 10:14:29 Gramajo, [name] ([id number]) [id number] Page 4 of 8 -------------------------------------------------------------------------------- Progress Note Details Patient Name: Date of Service: [name], [name] 05/13/2022 9:30 A M Medical Record Number: [id number] Patient Account Number: [id number] Date of Birth/Sex: Treating RN: 16-Jan-1966 (57 y.o. M) Primary Care Provider: [name] Other Clinician: Referring Provider: Treating Provider/Extender: Claria Dice in Treatment: 13 Subjective Chief Complaint Information obtained from Patient 02/11/2022 left great toe wound and fourth sub-met head wound History of Present Illness (HPI) 02/11/2022 Mr. Sarah Zerby is a 57 year old male with a past medical history of current tobacco user, OSA, venous insufficiency and peripheral neuropathy that presents the clinic for a 2-week history of ulcer to the left great toe and 2-98-month history of nonhealing ulcer to the left plantar foot. 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 He wears steel toed boots. He is currently out of work due to his wounds. On 11/26/2021 he was admitted to the hospital for cellulitis and abscess to the left foot. 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 He states he has been on doxycycline for 10 weeks. He just completed this course. He uses regular tennis shoes for walking. He is not offloading the area. He is using Betadine for wound dressings.  He has been following with Dr. Allena Katz, podiatry who Referred to our clinic. 1/23; patient presents for follow-up. He has been using Medihoney and Hydrofera Blue to the wound beds Along with his surgical shoe and offloading peg assist. He has no issues or complaints today. He denies signs of infection. 1/30; patient presents for follow-up. He has been using Medihoney and Hydrofera Blue to the wound beds. He has no issues or complaints today. Plan is for the total contact cast and patient is agreeable to move forward with this. 2/1; patient presents for follow-up.  We have been using antibiotic ointment with Hydrofera Blue to the wound beds under the total contact cast. He presents today for his obligatory cast change. He had no issues with the cast. 2/6; patient presents for follow-up. We have been using antibiotic ointment with Hydrofera Blue to the wound beds under the total contact cast. He has no issues or complaints today. He has tolerated the cast well. 2/13; patient presents for follow-up. We have been using the total contact cast to the left lower extremity. We have been using Hydrofera Blue and antibiotic ointment to the wound beds. He has no issues or complaints today. 2/20; patient presents for follow-up. We were using the total contact cast along with Hydrofera Blue and antibiotic ointment. He states the cast was slightly uncomfortable over the past week. 2/27; Patient presents for follow-up. We have been using Hydrofera Blue with antibiotic ointment under the total contact cast. He tolerated the cast well and has no issues or complaints today. 3/5; Patient presents for follow-up. We have been using Hydrofera Blue with antibiotic ointment under the total contact cast. He tolerated the cast well and has no issues or complaints today. 3/13; patient presents for follow-up. We have been using Hydrofera Blue with collagen to the left plantar foot wound and Hydrofera Blue with antibiotic  ointment to the left toe wound all under the total contact cast. He had no issues or complaints today. 3/19; patient presents for follow-up. We have been using Hydrofera Blue with collagen to the left plantar foot wound and antibiotic ointment with Hydrofera Blue to the left great toe wound. All under the total contact cast. The toe wound is healed. 3/26; patient presents for follow-up. We have been using Hydrofera Blue and collagen to the plantar foot wound. The left great toe wound remains healed. We have been doing this under the total contact cast. 04/29/2022: There is just a small open area at the base of the wound, which is otherwise clean. His periwound skin is fairly macerated. 4/9; patient presents for follow-up. We have been using Hydrofera Blue and antibiotic ointment under the total contact cast. He is tolerated the cast well. He has no issues or complaints today. 4/16; patient presents for follow-up. We have been using silver alginate with antibiotic ointment under the total contact cast. He has tolerated this well. The wound is well-healing. Patient History Family History Heart Disease - Mother, Lung Disease - Mother. Social History Current every day smoker - 10 cig a day, Alcohol Use - Never, Drug Use - No History, Caffeine Use - Never. Medical History Hematologic/Lymphatic Patient has history of Lymphedema - pumps use daily Respiratory Patient has history of Sleep Apnea - CPAP Cardiovascular Patient has history of Hypertension, Peripheral Venous Disease - needs ablation on left leg Musculoskeletal Patient has history of Osteoarthritis Denies history of Osteomyelitis Neurologic Patient has history of Neuropathy [name], [name] ([id number]) [id number] Page 5 of 8 Hospitalization/Surgery History - 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. Medical A Surgical History Notes nd Musculoskeletal abscess to left foot 3rd toe  11/28/2022 Objective Constitutional respirations regular, non-labored and within target range for patient.. Vitals Time Taken: 9:45 AM, Height: 75 in, Weight: 220 lbs, BMI: 27.5, Temperature: 98.8 F, Pulse: 68 bpm, Respiratory Rate: 18 breaths/min, Blood Pressure: 146/82 mmHg. Cardiovascular 2+ dorsalis pedis/posterior tibialis pulses. Psychiatric pleasant and cooperative. General Notes: On the plantar aspect of the left foot there is an open wound With granulation tissue throughout. No signs of infection. Integumentary (Hair, Skin)  Wound #2 status is Open. Original cause of wound was Surgical Injury. The date acquired was: 01/14/2022. The wound has been in treatment 13 weeks. The wound is located on the Left,Plantar Foot. The wound measures 0.2cm length x 0.2cm width x 0.2cm depth; 0.031cm^2 area and 0.006cm^3 volume. There is Fat Layer (Subcutaneous Tissue) exposed. There is no tunneling or undermining noted. There is a medium amount of serosanguineous drainage noted. The wound margin is distinct with the outline attached to the wound base. There is large (67-100%) red, pink granulation within the wound bed. There is no necrotic tissue within the wound bed. The periwound skin appearance exhibited: Callus. The periwound skin appearance did not exhibit: Crepitus, Excoriation, Induration, Rash, Scarring, Dry/Scaly, Maceration, Atrophie Blanche, Cyanosis, Ecchymosis, Hemosiderin Staining, Mottled, Pallor, Rubor, Erythema. 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 No need for debridement today. I recommended continuing the course with silver alginate and antibiotic ointment under the total contact cast. Follow-up  in 1 week. Procedures Wound #2 Pre-procedure diagnosis of Wound #2 is a Neuropathic Ulcer-Non Diabetic located on the Left,Plantar Foot . There was a T Research scientist (life sciences) Procedure by Roselie Awkward, Jago Carton, DO. Post procedure Diagnosis Wound #2: Same as Pre-Procedure Plan Follow-up Appointments: Return Appointment in 1 week. - Dr. Mikey Bussing and Maryruth Bun Rm # 9 Tuesday 05/20/22 @ 10:15 Anesthetic: (In clinic) Topical Lidocaine 4% applied to wound bed Bathing/ Shower/ Hygiene: May shower with protection but do not get wound dressing([name]) wet. Protect dressing([name]) with water repellant cover (for example, large plastic bag) or a cast cover and may then take shower. Edema Control - Lymphedema / SCD / OtherMAXFIELD, [name] ([id number]) [id number] Page 6 of 8 Elevate legs to the level of the heart or above for 30 minutes daily and/or when sitting for 3-4 times a day throughout the day. Avoid standing for long periods of time. Off-Loading: T Contact Cast to Left Lower Extremity - Size 4 apply zinc between toes and any moisture noted areas on foot. otal WOUND #2: - Foot Wound Laterality: Plantar, Left Cleanser: Wound Cleanser 1 x Per Week/30 Days Discharge Instructions: Cleanse the wound with wound cleanser prior to applying a clean dressing using gauze sponges, not tissue or cotton balls. Cleanser: Byram Ancillary Kit - 15 Day Supply (Generic) 1 x Per Week/30 Days Discharge Instructions: Use supplies as instructed; Kit contains: (15) Saline Bullets; (15) 3x3 Gauze; 15 pr Gloves Topical: Gentamicin 1 x Per Week/30 Days Discharge Instructions: As directed by physician Topical: Mupirocin Ointment 1 x Per Week/30 Days Discharge Instructions: Apply Mupirocin (Bactroban) as instructed Prim Dressing: Aquacel Ag Advantage Hydrofiber Rope w/silver, 0.39x18 (in/in) 1 x Per Week/30 Days ary Secondary Dressing: Optifoam Non-Adhesive Dressing, 4x4 in (Generic) 1 x Per Week/30  Days Discharge Instructions: Apply x1 layer donut to aid in offloading. Secondary Dressing: Woven Gauze Sponges 2x2 in (Generic) 1 x Per Week/30 Days Discharge Instructions: Apply over primary dressing as directed. Secured With: Insurance underwriter, Sterile 2x75 (in/in) (Generic) 1 x Per Week/30 Days Discharge Instructions: Secure with stretch gauze as directed. Secured With: 16M Medipore H Soft Cloth Surgical T ape, 4 x 10 (in/yd) (Generic) 1 x Per Week/30 Days Discharge Instructions: Secure with tape as directed. 1. Silver alginate with antibiotic ointment 2. T contact cast placed in standard  fashion otal 3. Follow-up in 1 week Electronic Signature([name]) Signed: 05/13/2022 10:18:30 AM By: Geralyn Corwin DO Entered By: Geralyn Corwin on 05/13/2022 10:17:40 -------------------------------------------------------------------------------- HxROS Details Patient Name: Date of Service: [name]. 05/13/2022 9:30 A M Medical Record Number: [id number] Patient Account Number: [id number] Date of Birth/Sex: Treating RN: 15-Dec-1965 (57 y.o. M) Primary Care Provider: [name] Other Clinician: Referring Provider: Treating Provider/Extender: Claria Dice in Treatment: 13 Hematologic/Lymphatic Medical History: Positive for: Lymphedema - pumps use daily Respiratory Medical History: Positive for: Sleep Apnea - CPAP Cardiovascular Medical History: Positive for: Hypertension; Peripheral Venous Disease - needs ablation on left leg Musculoskeletal Medical History: Positive for: Osteoarthritis Negative for: Osteomyelitis Past Medical History Notes: abscess to left foot 3rd toe 11/28/2022 Neurologic Medical History: Positive for: Neuropathy Immunizations Implantable Devices No devices added [name], [name] ([id number]) [id number] Page 7 of 8 Hospitalization / Surgery History Type of  Hospitalization/Surgery 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 Family and Social History Heart Disease: Yes - Mother; Lung Disease: Yes - Mother; Current every day smoker - 10 cig a day; Alcohol Use: Never; Drug Use: No History; Caffeine Use: Never; Financial Concerns: No; Food, Clothing or Shelter Needs: No; Support System Lacking: No; Transportation Concerns: No Electronic Signature([name]) Signed: 05/13/2022 10:18:30 AM By: Geralyn Corwin DO Entered By: Geralyn Corwin on 05/13/2022 10:16:15 -------------------------------------------------------------------------------- Total Contact Cast Details Patient Name: Date of Service: [name], [name] 05/13/2022 9:30 A M Medical Record Number: [id number] Patient Account Number: [id number] Date of Birth/Sex: Treating RN: 1965/03/11 (57 y.o. Charlean Merl, [name] Primary Care Provider: [name] Other Clinician: Referring Provider: Treating Provider/Extender: Claria Dice in Treatment: 13 T Contact Cast Applied for Wound Assessment: otal Wound #2 Left,Plantar Foot Performed By: Physician Geralyn Corwin, DO Post Procedure Diagnosis Same as Pre-procedure Electronic Signature([name]) Signed: 05/13/2022 10:18:30 AM By: Geralyn Corwin DO Signed: 05/13/2022 11:10:16 AM By: Fonnie Mu RN Entered By: Fonnie Mu on 05/13/2022 10:12:17 -------------------------------------------------------------------------------- SuperBill Details Patient Name: Date of Service: [name] 05/13/2022 Medical Record Number: [id number] Patient Account Number: [id number] Date of Birth/Sex: Treating RN: [birth year] (57 y.o. Charlean Merl, [name] Primary Care Provider: [name] Other Clinician: Referring Provider: Treating Provider/Extender: Claria Dice in Treatment: 13 Diagnosis Coding ICD-10 Codes Code Description [phone] Non-pressure chronic ulcer of other part  of 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 G90.09 Other idiopathic peripheral autonomic neuropathy G47.33 Obstructive sleep apnea (adult) (pediatric) I87.2 Venous insufficiency (chronic) (peripheral) Facility Procedures : CPT4 Code: [id number] Description: [phone] - APPLY TOTAL CONTACT LEG CAST ICD-10 Diagnosis Description L97.522 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 Modifier: Quantity: 1 Physician Procedures : CPT4 Code Description Modifier [id number] - WC PHYS APPLY TOTAL CONTACT CAST ICD-10 Diagnosis Description L97.522 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  [name], [name] ([id number]) [id number] Quantity: 1 Page 8 of 8 Electronic Signature([name]) Signed: 05/13/2022 10:18:30 AM By: Geralyn Corwin DO Entered By: Geralyn Corwin on 05/13/2022 10:17:51

## 2022-05-14 DIAGNOSIS — F1120 Opioid dependence, uncomplicated: Secondary | ICD-10-CM | POA: Diagnosis not present

## 2022-05-16 NOTE — Progress Notes (Signed)
[name], [name] ([id number]) [id number] Page 1 of 7 Visit Report for 05/13/2022 Arrival Information Details Patient Name: Date of Service: [name], [name] 05/13/2022 9:30 A M Medical Record Number: [id number] Patient Account Number: [id number] Date of Birth/Sex: Treating RN: 1965-09-21 (57 y.o. M) Primary Care [place]: [name] Other Clinician: Referring Keymarion Bearman: Treating Suyash Amory/Extender: Claria Dice in Treatment: 13 Visit Information History Since Last Visit Added or deleted any medications: No Patient Arrived: Ambulatory Any new allergies or adverse reactions: No Arrival Time: 09:45 Had a fall or experienced change in No Accompanied By: self activities of daily living that may affect Transfer Assistance: None risk of falls: Patient Identification Verified: Yes Signs or symptoms of abuse/neglect since last No Secondary Verification Process Completed: Yes visito Patient Requires Transmission-Based No Hospitalized since last visit: No Precautions: Implantable device outside of the clinic No Patient Has Alerts: Yes excluding Patient Alerts: 11/27/21 ABI L 1.26 R1.09 cellular tissue based products placed in the 11/27/2021 TBI L0.72 center R0.66 since last visit: Has Dressing in Place as Prescribed: Yes Has Footwear/Offloading in Place as Yes Prescribed: Left: Removable Cast Walker/Walking Boot T Contact Cast otal Pain Present Now: No Electronic Signature([name]) Signed: 05/16/2022 11:38:03 AM By: Thayer Dallas Entered By: Thayer Dallas on 05/13/2022 09:45:48 -------------------------------------------------------------------------------- Encounter Discharge Information Details Patient Name: Date of Service: John Genera. 05/13/2022 9:30 A M Medical Record Number: [id number] Patient Account Number: [id number] Date of Birth/Sex: Treating RN: [birth year] (57 y.o. Charlean Merl, [name] Primary Care  Wilberth Damon: [name] Other Clinician: Referring Orlan Aversa: Treating Latania Bascomb/Extender: Claria Dice in Treatment: 13 Encounter Discharge Information Items Discharge Condition: Stable Ambulatory Status: Ambulatory Discharge Destination: Home Transportation: Private Auto Accompanied By: self Schedule Follow-up Appointment: Yes Clinical Summary of Care: Patient Declined Electronic Signature([name]) Signed: 05/13/2022 11:10:16 AM By: Fonnie Mu RN Entered By: Fonnie Mu on 05/13/2022 10:13:05 -------------------------------------------------------------------------------- Lower Extremity Assessment Details Patient Name: Date of Service: John Genera. 05/13/2022 9:30 A M Medical Record Number: [id number] Patient Account Number: [id number] Date of Birth/Sex: Treating RN: [birth year] (57 y.o. M) Primary Care Josephina Melcher: [name] Other Clinician: GANESH, DEEG ([id number]) [id number] Page 2 of 7 Referring Aleasha Fregeau: Treating Renell Coaxum/Extender: Marcelle Smiling, Ajith Weeks in Treatment: 13 Edema Assessment Assessed: [Left: No] [Right: No] Edema: [Left: Ye] [Right: [name]] Calf Left: Right: Point of Measurement: 43 cm From Medial Instep 41 cm Ankle Left: Right: Point of Measurement: 11 cm From Medial Instep 29 cm Electronic Signature([name]) Signed: 05/16/2022 11:38:03 AM By: Thayer Dallas Entered By: Thayer Dallas on 05/13/2022 10:04:40 -------------------------------------------------------------------------------- Multi Wound Chart Details Patient Name: Date of Service: John Genera. 05/13/2022 9:30 A M Medical Record Number: [id number] Patient Account Number: [id number] Date of Birth/Sex: Treating RN: 09-10-1965 (57 y.o. M) Primary Care Reeves Musick: [name] Other Clinician: Referring Wood Novacek: Treating Kalman Nylen/Extender: Claria Dice in  Treatment: 13 Vital Signs Height(in): 75 Pulse(bpm): 68 Weight(lbs): 220 Blood Pressure(mmHg): 146/82 Body Mass Index(BMI): 27.5 Temperature(F): 98.8 Respiratory Rate(breaths/min): 18 [2:Photos:] [N/A:N/A] Left, Plantar Foot N/A N/A Wound Location: Surgical Injury N/A N/A Wounding Event: Neuropathic Ulcer-Non Diabetic N/A N/A Primary Etiology: Lymphedema, Sleep Apnea, N/A N/A Comorbid History: Hypertension, Peripheral Venous Disease, Osteoarthritis, Neuropathy 01/14/2022 N/A N/A Date Acquired: 13 N/A N/A Weeks of Treatment: Open N/A N/A Wound Status: No N/A N/A Wound Recurrence: Yes N/A N/A Pending A mputation on Presentation: 0.2x0.2x0.2 N/A N/A Measurements L x W x D (cm) 0.031 N/A N/A A (cm) : rea 0.006 N/A N/A Volume (cm) : 73.70% N/A N/A %  Reduction in A rea: 75.00% N/A N/A % Reduction in Volume: Full Thickness Without Exposed N/A N/A Classification: Support Structures Medium N/A N/A Exudate Amount: Serosanguineous N/A N/A Exudate Type: red, brown N/A N/A Exudate Color: Distinct, outline attached N/A N/A Wound Margin: Large (67-100%) N/A N/A Granulation Amount: Red, Pink N/A N/A Granulation QualityJEP, DYAS ([id number]) [id number] Page 3 of 7 None Present (0%) N/A N/A Necrotic Amount: Fat Layer (Subcutaneous Tissue): Yes N/A N/A Exposed Structures: Fascia: No Tendon: No Muscle: No Joint: No Bone: No Large (67-100%) N/A N/A Epithelialization: Callus: Yes N/A N/A Periwound Skin Texture: Excoriation: No Induration: No Crepitus: No Rash: No Scarring: No Maceration: No N/A N/A Periwound Skin Moisture: Dry/Scaly: No Atrophie Blanche: No N/A N/A Periwound Skin Color: Cyanosis: No Ecchymosis: No Erythema: No Hemosiderin Staining: No Mottled: No Pallor: No Rubor: No T Contact Cast otal N/A N/A Procedures Performed: Treatment Notes Wound #2 (Foot) Wound Laterality: Plantar,  Left Cleanser Wound Cleanser Discharge Instruction: Cleanse the wound with wound cleanser prior to applying a clean dressing using gauze sponges, not tissue or cotton balls. Byram Ancillary Kit - 15 Day Supply Discharge Instruction: Use supplies as instructed; Kit contains: (15) Saline Bullets; (15) 3x3 Gauze; 15 pr Gloves Peri-Wound Care Topical Gentamicin Discharge Instruction: As directed by physician Mupirocin Ointment Discharge Instruction: Apply Mupirocin (Bactroban) as instructed Primary Dressing Aquacel Ag Advantage Hydrofiber Rope w/silver, 0.39x18 (in/in) Secondary Dressing Optifoam Non-Adhesive Dressing, 4x4 in Discharge Instruction: Apply x1 layer donut to aid in offloading. Woven Gauze Sponges 2x2 in Discharge Instruction: Apply over primary dressing as directed. Secured With Conforming Stretch Gauze Bandage, Sterile 2x75 (in/in) Discharge Instruction: Secure with stretch gauze as directed. 46M Medipore H Soft Cloth Surgical T ape, 4 x 10 (in/yd) Discharge Instruction: Secure with tape as directed. Compression Wrap Compression Stockings Add-Ons Electronic Signature([name]) Signed: 05/13/2022 10:18:30 AM By: Geralyn Corwin DO Entered By: Geralyn Corwin on 05/13/2022 10:14:34 -------------------------------------------------------------------------------- Multi-Disciplinary Care Plan Details Patient Name: Date of Service: John Genera. 05/13/2022 9:30 A M Medical Record Number: [id number] Patient Account Number: [id number] Date of Birth/Sex: Treating RN: November 25, 1965 (57 y.o. [name], [name], [name] ([id number]) [id number] Page 4 of 7 Primary Care Saki Legore: [name] Other Clinician: Referring Codey Burling: Treating Kendrell Lottman/Extender: Claria Dice in Treatment: 29 Active Inactive Peripheral Neuropathy Nursing Diagnoses: Potential alteration in peripheral tissue perfusion (select prior  to confirmation of diagnosis) Goals: Patient/caregiver will verbalize understanding of disease process and disease management Date Initiated: 02/11/2022 Target Resolution Date: 05/30/2022 Goal Status: Active Interventions: Assess signs and symptoms of neuropathy upon admission and as needed Provide education on Management of Neuropathy upon discharge from the Wound Center Treatment Activities: Patient referred for customized footwear/offloading : 02/11/2022 Notes: Electronic Signature([name]) Signed: 05/13/2022 11:10:16 AM By: Fonnie Mu RN Entered By: Fonnie Mu on 05/13/2022 10:11:40 -------------------------------------------------------------------------------- Pain Assessment Details Patient Name: Date of Service: John Genera 05/13/2022 9:30 A M Medical Record Number: [id number] Patient Account Number: [id number] Date of Birth/Sex: Treating RN: 1965-06-07 (57 y.o. M) Primary Care Asencion Guisinger: [name] Other Clinician: Referring Deryl Giroux: Treating Taray Normoyle/Extender: Claria Dice in Treatment: 13 Active Problems Location of Pain Severity and Description of Pain Patient Has Paino No Site Locations Pain Management and Medication Current Pain Management: Electronic Signature([name]) Signed: 05/16/2022 11:38:03 AM By: Thayer Dallas Entered By: Thayer Dallas on 05/13/2022 09:46:20 [name], [name] ([id number]) [id number] Page 5 of 7 -------------------------------------------------------------------------------- Patient/Caregiver Education Details Patient Name: Date of Service: [name], MASER. 4/16/2024andnbsp9:30 A M Medical Record  Number: [id number] Patient Account Number: [id number] Date of Birth/Gender: Treating RN: 04/01/1965 (57 y.o. Lucious Groves Primary Care Physician: [name] Other Clinician: Referring Physician: Treating Physician/Extender: Claria Dice in Treatment: 13 Education Assessment Education Provided To: Patient Education Topics Provided Wound/Skin Impairment: Methods: Explain/Verbal Responses: Reinforcements needed, State content correctly Electronic Signature([name]) Signed: 05/13/2022 11:10:16 AM By: Fonnie Mu RN Entered By: Fonnie Mu on 05/13/2022 10:11:51 -------------------------------------------------------------------------------- Wound Assessment Details Patient Name: Date of Service: John Genera. 05/13/2022 9:30 A M Medical Record Number: [id number] Patient Account Number: [id number] Date of Birth/Sex: Treating RN: 10-18-1965 (57 y.o. M) Primary Care Sitara Cashwell: [name] Other Clinician: Referring Leonora Gores: Treating Laporshia Hogen/Extender: Tonita Phoenix Weeks in Treatment: 13 Wound Status Wound Number: 2 Primary Neuropathic Ulcer-Non Diabetic Etiology: Wound Location: Left, Plantar Foot Wound Open Wounding Event: Surgical Injury Status: Date Acquired: 01/14/2022 Comorbid Lymphedema, Sleep Apnea, Hypertension, Peripheral Venous Weeks Of Treatment: 13 History: Disease, Osteoarthritis, Neuropathy Clustered Wound: No Pending Amputation On Presentation Photos Wound Measurements Length: (cm) 0.2 Width: (cm) 0.2 Depth: (cm) 0.2 Area: (cm) 0.031 Volume: (cm) 0.006 % Reduction in Area: 73.7% % Reduction in Volume: 75% Epithelialization: Large (67-100%) Tunneling: No Undermining: No Wound Description Classification: Full Thickness Without Exposed Support Wound Margin: Distinct, outline attached [name], [name] ([id number]) Exudate Amount: Medium Exudate Type: Serosanguineous Exudate Color: red, brown Structures Foul Odor After Cleansing: No Slough/Fibrino Yes [id number] Page 6 of 7 Wound Bed Granulation Amount: Large (67-100%) Exposed Structure Granulation Quality: Red, Pink Fascia Exposed: No Necrotic Amount:  None Present (0%) Fat Layer (Subcutaneous Tissue) Exposed: Yes Tendon Exposed: No Muscle Exposed: No Joint Exposed: No Bone Exposed: No Periwound Skin Texture Texture Color No Abnormalities Noted: No No Abnormalities Noted: No Callus: Yes Atrophie Blanche: No Crepitus: No Cyanosis: No Excoriation: No Ecchymosis: No Induration: No Erythema: No Rash: No Hemosiderin Staining: No Scarring: No Mottled: No Pallor: No Moisture Rubor: No No Abnormalities Noted: No Dry / Scaly: No Maceration: No Treatment Notes Wound #2 (Foot) Wound Laterality: Plantar, Left Cleanser Wound Cleanser Discharge Instruction: Cleanse the wound with wound cleanser prior to applying a clean dressing using gauze sponges, not tissue or cotton balls. Byram Ancillary Kit - 15 Day Supply Discharge Instruction: Use supplies as instructed; Kit contains: (15) Saline Bullets; (15) 3x3 Gauze; 15 pr Gloves Peri-Wound Care Topical Gentamicin Discharge Instruction: As directed by physician Mupirocin Ointment Discharge Instruction: Apply Mupirocin (Bactroban) as instructed Primary Dressing Aquacel Ag Advantage Hydrofiber Rope w/silver, 0.39x18 (in/in) Secondary Dressing Optifoam Non-Adhesive Dressing, 4x4 in Discharge Instruction: Apply x1 layer donut to aid in offloading. Woven Gauze Sponges 2x2 in Discharge Instruction: Apply over primary dressing as directed. Secured With Conforming Stretch Gauze Bandage, Sterile 2x75 (in/in) Discharge Instruction: Secure with stretch gauze as directed. 62M Medipore H Soft Cloth Surgical T ape, 4 x 10 (in/yd) Discharge Instruction: Secure with tape as directed. Compression Wrap Compression Stockings Add-Ons Electronic Signature([name]) Signed: 05/16/2022 11:38:03 AM By: Thayer Dallas Entered By: Thayer Dallas on 05/13/2022 10:07:28 [name], [name] ([id number]) [id number] Page 7 of  7 -------------------------------------------------------------------------------- Vitals Details Patient Name: Date of Service: [name], [name] 05/13/2022 9:30 A M Medical Record Number: [id number] Patient Account Number: [id number] Date of Birth/Sex: Treating RN: 07-May-1965 (57 y.o. M) Primary Care Valisa Karpel: [name] Other Clinician: Referring Blondina Coderre: Treating Chelbi Herber/Extender: Claria Dice in Treatment: 13 Vital Signs Time Taken: 09:45 Temperature (F): 98.8 Height (in): 75 Pulse (bpm): 68 Weight (lbs): 220 Respiratory Rate (breaths/min): 18 Body Mass Index (BMI):  27.5 Blood Pressure (mmHg): 146/82 Reference Range: 80 - 120 mg / dl Electronic Signature([name]) Signed: 05/16/2022 11:38:03 AM By: Thayer Dallas Entered By: Thayer Dallas on 05/13/2022 09:46:14

## 2022-05-20 ENCOUNTER — Encounter (HOSPITAL_BASED_OUTPATIENT_CLINIC_OR_DEPARTMENT_OTHER): Payer: BC Managed Care – PPO | Admitting: Internal Medicine

## 2022-05-20 DIAGNOSIS — I10 Essential (primary) hypertension: Secondary | ICD-10-CM | POA: Diagnosis not present

## 2022-05-20 DIAGNOSIS — L97522 Non-pressure chronic ulcer of other part of 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: Secondary | ICD-10-CM | POA: Diagnosis not present

## 2022-05-20 DIAGNOSIS — G9009 Other idiopathic peripheral autonomic neuropathy: Secondary | ICD-10-CM | POA: Diagnosis not present

## 2022-05-20 DIAGNOSIS — L02612 Cutaneous abscess of left foot: Secondary | ICD-10-CM | POA: Diagnosis not present

## 2022-05-20 DIAGNOSIS — M1990 Unspecified osteoarthritis, unspecified site: Secondary | ICD-10-CM | POA: Diagnosis not present

## 2022-05-20 DIAGNOSIS — G4733 Obstructive sleep apnea (adult) (pediatric): Secondary | ICD-10-CM | POA: Diagnosis not present

## 2022-05-20 DIAGNOSIS — I872 Venous insufficiency (chronic) (peripheral): Secondary | ICD-10-CM | POA: Diagnosis not present

## 2022-05-21 DIAGNOSIS — F1120 Opioid dependence, uncomplicated: Secondary | ICD-10-CM | POA: Diagnosis not present

## 2022-05-21 DIAGNOSIS — L0390 Cellulitis, unspecified: Secondary | ICD-10-CM | POA: Diagnosis not present

## 2022-05-21 DIAGNOSIS — I10 Essential (primary) hypertension: Secondary | ICD-10-CM | POA: Diagnosis not present

## 2022-05-21 DIAGNOSIS — I739 Peripheral vascular disease, unspecified: Secondary | ICD-10-CM | POA: Diagnosis not present

## 2022-05-21 NOTE — Progress Notes (Signed)
[name], [name] ([id number]) [id number] Page 1 of 8 Visit Report for 05/20/2022 Chief Complaint Document Details Patient Name: Date of Service: [name], [name] 05/20/2022 9:00 A M Medical Record Number: [id number] Patient Account Number: [id number] Date of Birth/Sex: Treating RN: 1965/11/22 (57 y.o. M) Primary Care Provider: [name] Other Clinician: Referring Provider: Treating Provider/Extender: Claria Dice in Treatment: 14 Information Obtained from: Patient Chief Complaint 02/11/2022 left great toe wound and fourth sub-met head wound Electronic Signature([name]) Signed: 05/20/2022 12:19:49 PM By: Geralyn Corwin [name] Entered By: Geralyn Corwin on 05/20/2022 11:51:16 -------------------------------------------------------------------------------- HPI Details Patient Name: Date of Service: John Genera. 05/20/2022 9:00 A M Medical Record Number: [id number] Patient Account Number: [id number] Date of Birth/Sex: Treating RN: 07-Mar-1965 (57 y.o. M) Primary Care Provider: [name] Other Clinician: Referring Provider: Treating Provider/Extender: Claria Dice in Treatment: 14 History of Present Illness HPI Description: 02/11/2022 [name] is a 57 year old male with a past medical history of current tobacco user, OSA, venous insufficiency and peripheral neuropathy that presents the clinic for a 2-week history of ulcer to the left great toe and 2-65-month history of nonhealing ulcer to the left plantar foot. 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 He wears steel toed boots. He is currently out of work due to his wounds. On 11/26/2021 he was admitted to the hospital for cellulitis and abscess to the left foot. 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 He states he has been on doxycycline for 10 weeks. He just completed this  course. He uses regular tennis shoes for walking. He is not offloading the area. He is using Betadine for wound dressings. He has been following with Dr. Allena Katz, podiatry who Referred to our clinic. 1/23; patient presents for follow-up. He has been using Medihoney and Hydrofera Blue to the wound beds Along with his surgical shoe and offloading peg assist. He has no issues or complaints today. He denies signs of infection. 1/30; patient presents for follow-up. He has been using Medihoney and Hydrofera Blue to the wound beds. He has no issues or complaints today. Plan is for the total contact cast and patient is agreeable to move forward with this. 2/1; patient presents for follow-up. We have been using antibiotic ointment with Hydrofera Blue to the wound beds under the total contact cast. He presents today for his obligatory cast change. He had no issues with the cast. 2/6; patient presents for follow-up. We have been using antibiotic ointment with Hydrofera Blue to the wound beds under the total contact cast. He has no issues or complaints today. He has tolerated the cast well. 2/13; patient presents for follow-up. We have been using the total contact cast to the left lower extremity. We have been using Hydrofera Blue and antibiotic ointment to the wound beds. He has no issues or complaints today. 2/20; patient presents for follow-up. We were using the total contact cast along with Hydrofera Blue and antibiotic ointment. He states the cast was slightly uncomfortable over the past week. 2/27; Patient presents for follow-up. We have been using Hydrofera Blue with antibiotic ointment under the total contact cast. He tolerated the cast well and has no issues or complaints today. 3/5; Patient presents for follow-up. We have been using Hydrofera Blue with antibiotic ointment under the total contact cast. He tolerated the cast well and has no issues or complaints today. 3/13; patient presents for  follow-up. We have been using Hydrofera  Blue with collagen to the left plantar foot wound and Hydrofera Blue with antibiotic ointment to the left toe wound all under the total contact cast. He had no issues or complaints today. 3/19; patient presents for follow-up. We have been using Hydrofera Blue with collagen to the left plantar foot wound and antibiotic ointment with Hydrofera Blue to the left great toe wound. All under the total contact cast. The toe wound is healed. 3/26; patient presents for follow-up. We have been using Hydrofera Blue and collagen to the plantar foot wound. The left great toe wound remains healed. We have been doing this under the total contact cast. 04/29/2022: There is just a small open area at the base of the wound, which is otherwise clean. His periwound skin is fairly macerated. [name], [name] ([id number]) [id number] Page 2 of 8 4/9; patient presents for follow-up. We have been using Hydrofera Blue and antibiotic ointment under the total contact cast. He is tolerated the cast well. He has no issues or complaints today. 4/16; patient presents for follow-up. We have been using silver alginate with antibiotic ointment under the total contact cast. He has tolerated this well. The wound is well-healing. 4/23; we have been using antibiotic ointment with Aquacel Ag under the total contact cast. His wound is almost healed. Electronic Signature([name]) Signed: 05/20/2022 12:19:49 PM By: Geralyn Corwin [name] Entered By: Geralyn Corwin on 05/20/2022 11:51:43 -------------------------------------------------------------------------------- Physical Exam Details Patient Name: Date of Service: [name], [name] 05/20/2022 9:00 A M Medical Record Number: [id number] Patient Account Number: [id number] Date of Birth/Sex: Treating RN: 07/06/1965 (57 y.o. M) Primary Care Provider: [name] Other Clinician: Referring Provider: Treating  Provider/Extender: Claria Dice in Treatment: 14 Constitutional respirations regular, non-labored and within target range for patient.. Cardiovascular 2+ dorsalis pedis/posterior tibialis pulses. Psychiatric pleasant and cooperative. Notes Small open wound to the plantar aspect of the left foot with granulation tissue. Mostly epithelialization. No signs of infection. Electronic Signature([name]) Signed: 05/20/2022 12:19:49 PM By: Geralyn Corwin [name] Entered By: Geralyn Corwin on 05/20/2022 11:52:24 -------------------------------------------------------------------------------- Physician Orders Details Patient Name: Date of Service: John Genera. 05/20/2022 9:00 A M Medical Record Number: [id number] Patient Account Number: [id number] Date of Birth/Sex: Treating RN: November 24, 1965 (57 y.o. [name] Primary Care Provider: [name] Other Clinician: Referring Provider: Treating Provider/Extender: Claria Dice in Treatment: 61 Verbal / Phone Orders: No Diagnosis Coding ICD-10 Coding Code Description [phone] 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 G47.33 Obstructive sleep apnea (adult) (pediatric) I87.2 Venous insufficiency (chronic) (peripheral) Follow-up Appointments ppointment in 1 week. - Dr. Mikey Bussing and Maryruth Bun Rm # 9 Tuesday 05/27/2022 @ 0930 Return A Anesthetic (In clinic) Topical Lidocaine 4% applied to wound bed Bathing/ Shower/ Hygiene May shower with protection but [name] not get wound dressing([name]) wet. Protect dressing([name]) with water repellant cover (for example, large plastic bag) or a cast cover and may then take shower. [name], [name] ([id number]) [id number] Page 3 of 8 Edema Control - Lymphedema / SCD / Other Elevate legs to the level of the heart or above for 30 minutes  daily and/or when sitting for 3-4 times a day throughout the day. Avoid [name] for long periods of time. Off-Loading Total Contact Cast to Left Lower Extremity - Size 4 apply zinc between toes and any moisture noted areas on foot. Wound Treatment Wound #2 - Foot Wound Laterality: Plantar, Left Cleanser: Wound Cleanser 1 x Per Week/30  Days Discharge Instructions: Cleanse the wound with wound cleanser prior to applying a clean dressing using gauze sponges, not tissue or cotton balls. Cleanser: Byram Ancillary Kit - 15 Day Supply (Generic) 1 x Per Week/30 Days Discharge Instructions: Use supplies as instructed; Kit contains: (15) Saline Bullets; (15) 3x3 Gauze; 15 pr Gloves Prim Dressing: Aquacel Ag Advantage Hydrofiber Rope w/silver, 0.39x18 (in/in) ary 1 x Per Week/30 Days Secondary Dressing: Optifoam Non-Adhesive Dressing, 4x4 in (Generic) 1 x Per Week/30 Days Discharge Instructions: Apply x1 layer donut to aid in offloading. Secondary Dressing: Woven Gauze Sponges 2x2 in (Generic) 1 x Per Week/30 Days Discharge Instructions: Apply over primary dressing as directed. Secured With: 43M Medipore [name] Soft Cloth Surgical T ape, 4 x 10 (in/yd) (Generic) 1 x Per Week/30 Days Discharge Instructions: Secure with tape as directed. Electronic Signature([name]) Signed: 05/20/2022 12:19:49 PM By: Geralyn Corwin [name] Entered By: Geralyn Corwin on 05/20/2022 11:52:32 -------------------------------------------------------------------------------- Problem List Details Patient Name: Date of Service: John Genera. 05/20/2022 9:00 A M Medical Record Number: [id number] Patient Account Number: [id number] Date of Birth/Sex: Treating RN: 30-Nov-1965 (57 y.o. [name] Primary Care Provider: [name] Other Clinician: Referring Provider: Treating Provider/Extender: Tonita Phoenix Weeks in Treatment: 14 Active Problems ICD-10 Encounter Code Description Active Date  MDM Diagnosis [phone] Non-pressure chronic ulcer of other part of 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 02/11/2022 No Yes G90.09 Other idiopathic peripheral autonomic neuropathy 02/11/2022 No Yes G47.33 Obstructive sleep apnea (adult) (pediatric) 02/11/2022 No Yes I87.2 Venous insufficiency (chronic) (peripheral) 02/11/2022 No Yes Inactive Problems Resolved Problems Electronic Signature([name]) Signed: 05/20/2022 12:19:49 PM By: Geralyn Corwin [name] Entered By: Geralyn Corwin on 05/20/2022 11:51:01 Harts, Trafton [name] ([id number]) [id number] Page 4 of 8 -------------------------------------------------------------------------------- Progress Note Details Patient Name: Date of Service: [name], [name] 05/20/2022 9:00 A M Medical Record Number: [id number] Patient Account Number: [id number] Date of Birth/Sex: Treating RN: 23-Jan-1966 (57 y.o. M) Primary Care Provider: [name] Other Clinician: Referring Provider: Treating Provider/Extender: Claria Dice in Treatment: 14 Subjective Chief Complaint Information obtained from Patient 02/11/2022 left great toe wound and fourth sub-met head wound History of Present Illness (HPI) 02/11/2022 [name] is a 57 year old male with a past medical history of current tobacco user, OSA, venous insufficiency and peripheral neuropathy that presents the clinic for a 2-week history of ulcer to the left great toe and 2-61-month history of nonhealing ulcer to the left plantar foot. 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 He wears steel toed boots. He is currently out of work due to his wounds. On 11/26/2021 he was admitted to the hospital for cellulitis and abscess to the left foot. 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 He states he has been on doxycycline for 10 weeks. He just completed this course. He uses regular tennis shoes for walking. He is  not offloading the area. He is using Betadine for wound dressings. He has been following with Dr. Allena Katz, podiatry who Referred to our clinic. 1/23; patient presents for follow-up. He has been using Medihoney and Hydrofera Blue to the wound beds Along with his surgical shoe and offloading peg assist. He has no issues or complaints today. He denies signs of infection. 1/30; patient presents for follow-up. He has been using Medihoney and Hydrofera Blue to the wound beds. He has no issues or complaints today. Plan is for the total contact cast and patient is agreeable to move forward with this.  2/1; patient presents for follow-up. We have been using antibiotic ointment with Hydrofera Blue to the wound beds under the total contact cast. He presents today for his obligatory cast change. He had no issues with the cast. 2/6; patient presents for follow-up. We have been using antibiotic ointment with Hydrofera Blue to the wound beds under the total contact cast. He has no issues or complaints today. He has tolerated the cast well. 2/13; patient presents for follow-up. We have been using the total contact cast to the left lower extremity. We have been using Hydrofera Blue and antibiotic ointment to the wound beds. He has no issues or complaints today. 2/20; patient presents for follow-up. We were using the total contact cast along with Hydrofera Blue and antibiotic ointment. He states the cast was slightly uncomfortable over the past week. 2/27; Patient presents for follow-up. We have been using Hydrofera Blue with antibiotic ointment under the total contact cast. He tolerated the cast well and has no issues or complaints today. 3/5; Patient presents for follow-up. We have been using Hydrofera Blue with antibiotic ointment under the total contact cast. He tolerated the cast well and has no issues or complaints today. 3/13; patient presents for follow-up. We have been using Hydrofera Blue with collagen to the  left plantar foot wound and Hydrofera Blue with antibiotic ointment to the left toe wound all under the total contact cast. He had no issues or complaints today. 3/19; patient presents for follow-up. We have been using Hydrofera Blue with collagen to the left plantar foot wound and antibiotic ointment with Hydrofera Blue to the left great toe wound. All under the total contact cast. The toe wound is healed. 3/26; patient presents for follow-up. We have been using Hydrofera Blue and collagen to the plantar foot wound. The left great toe wound remains healed. We have been doing this under the total contact cast. 04/29/2022: There is just a small open area at the base of the wound, which is otherwise clean. His periwound skin is fairly macerated. 4/9; patient presents for follow-up. We have been using Hydrofera Blue and antibiotic ointment under the total contact cast. He is tolerated the cast well. He has no issues or complaints today. 4/16; patient presents for follow-up. We have been using silver alginate with antibiotic ointment under the total contact cast. He has tolerated this well. The wound is well-healing. 4/23; we have been using antibiotic ointment with Aquacel Ag under the total contact cast. His wound is almost healed. Patient History Family History Heart Disease - Mother, Lung Disease - Mother. Social History Current every day smoker - 10 cig a day, Alcohol Use - Never, Drug Use - No History, Caffeine Use - Never. Medical History Hematologic/Lymphatic Patient has history of Lymphedema - pumps use daily Respiratory Patient has history of Sleep Apnea - CPAP Cardiovascular Patient has history of Hypertension, Peripheral Venous Disease - needs ablation on left leg Musculoskeletal [name], [name] ([id number]) [id number] Page 5 of 8 Patient has history of Osteoarthritis Denies history of Osteomyelitis Neurologic Patient has history of  Neuropathy Hospitalization/Surgery History - 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. Medical A Surgical History Notes nd Musculoskeletal abscess to left foot 3rd toe 11/28/2022 Objective Constitutional respirations regular, non-labored and within target range for patient.. Vitals Time Taken: 9:37 AM, Height: 75 in, Weight: 220 lbs, BMI: 27.5, Temperature: 98.5 F, Pulse: 68 bpm, Respiratory Rate: 18 breaths/min, Blood Pressure: 137/73 mmHg. Cardiovascular 2+ dorsalis pedis/posterior tibialis pulses. Psychiatric pleasant and cooperative. General  Notes: Small open wound to the plantar aspect of the left foot with granulation tissue. Mostly epithelialization. No signs of infection. Integumentary (Hair, Skin) Wound #2 status is Open. Original cause of wound was Surgical Injury. The date acquired was: 01/14/2022. The wound has been in treatment 14 weeks. The wound is located on the Left,Plantar Foot. The wound measures 0.1cm length x 0.1cm width x 0.1cm depth; 0.008cm^2 area and 0.001cm^3 volume. There is Fat Layer (Subcutaneous Tissue) exposed. There is no tunneling or undermining noted. There is a medium amount of serosanguineous drainage noted. The wound margin is distinct with the outline attached to the wound base. There is large (67-100%) red, pink granulation within the wound bed. There is no necrotic tissue within the wound bed. The periwound skin appearance exhibited: Callus. The periwound skin appearance did not exhibit: Crepitus, Excoriation, Induration, Rash, Scarring, Dry/Scaly, Maceration, Atrophie Blanche, Cyanosis, Ecchymosis, Hemosiderin Staining, Mottled, Pallor, Rubor, Erythema. 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 The wound is almost healed. We will continue with this treatment. He follows regularly with podiatry and I recommended he follow-up with them to discuss orthotics. Procedures Wound #2 Pre-procedure diagnosis of Wound #2 is a Neuropathic Ulcer-Non Diabetic located on the Left,Plantar Foot . There was a T Research scientist (life sciences) Procedure by [name], John Patrie, [name]. Post procedure Diagnosis Wound #2: Same as Pre-Procedure Plan Follow-up Appointments: Return Appointment in 1 week. - Dr. Mikey Bussing and Maryruth Bun Rm # 9 Tuesday 05/27/2022 @ 0930 Anesthetic: (In clinic) Topical Lidocaine 4% applied to wound bed [name], [name] ([id number]) [id number] Page 6 of 8 Bathing/ Shower/ Hygiene: May shower with protection but [name] not get wound dressing([name]) wet. Protect dressing([name]) with water repellant cover (for example, large plastic bag) or a cast cover and may then take shower. Edema Control - Lymphedema / SCD / Other: Elevate legs to the level of the heart or above for 30 minutes daily and/or when sitting for 3-4 times a day throughout the day. Avoid [name] for long periods of time. Off-Loading: T Contact Cast to Left Lower Extremity - Size 4 apply zinc between toes and any moisture noted areas on foot. otal WOUND #2: - Foot Wound Laterality: Plantar, Left Cleanser: Wound Cleanser 1 x Per Week/30 Days Discharge Instructions: Cleanse the wound with wound cleanser prior to applying a clean dressing using gauze sponges, not tissue or cotton balls. Cleanser: Byram Ancillary Kit - 15 Day Supply (Generic) 1 x Per Week/30 Days Discharge Instructions: Use supplies as instructed; Kit contains: (15) Saline Bullets; (15) 3x3 Gauze; 15 pr Gloves Prim Dressing: Aquacel Ag Advantage Hydrofiber Rope w/silver, 0.39x18 (in/in) 1 x Per Week/30 Days ary Secondary Dressing: Optifoam Non-Adhesive Dressing, 4x4 in (Generic) 1 x Per Week/30 Days Discharge Instructions: Apply x1  layer donut to aid in offloading. Secondary Dressing: Woven Gauze Sponges 2x2 in (Generic) 1 x Per Week/30 Days Discharge Instructions: Apply over primary dressing as directed. Secured With: 71M Medipore [name] Soft Cloth Surgical T ape, 4 x 10 (in/yd) (Generic) 1 x Per Week/30 Days Discharge Instructions: Secure with tape as directed. 1. Silver alginate 2. T contact cast placed in standard fashion otal 3. Follow-up in 1 week Electronic Signature([name]) Signed: 05/20/2022 12:19:49 PM By: Geralyn Corwin [name] Entered By: Geralyn Corwin on 05/20/2022 11:53:33 -------------------------------------------------------------------------------- HxROS Details Patient Name:  Date of Service: HEBERT, DOOLING 05/20/2022 9:00 A M Medical Record Number: [id number] Patient Account Number: [id number] Date of Birth/Sex: Treating RN: 1965-07-03 (56 y.o. M) Primary Care Provider: [name] Other Clinician: Referring Provider: Treating Provider/Extender: Claria Dice in Treatment: 14 Hematologic/Lymphatic Medical History: Positive for: Lymphedema - pumps use daily Respiratory Medical History: Positive for: Sleep Apnea - CPAP Cardiovascular Medical History: Positive for: Hypertension; Peripheral Venous Disease - needs ablation on left leg Musculoskeletal Medical History: Positive for: Osteoarthritis Negative for: Osteomyelitis Past Medical History Notes: abscess to left foot 3rd toe 11/28/2022 Neurologic Medical History: Positive for: Neuropathy Immunizations Implantable Devices No devices added Hospitalization / Surgery History LADARRIAN, ASENCIO ([id number]) [id number] Page 7 of 8 Type of Hospitalization/Surgery 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 Family and Social History Heart Disease: Yes - Mother; Lung Disease: Yes - Mother; Current every day smoker - 10 cig a day; Alcohol Use: Never; Drug Use: No History;  Caffeine Use: Never; Financial Concerns: No; Food, Clothing or Shelter Needs: No; Support System Lacking: No; Transportation Concerns: No Electronic Signature([name]) Signed: 05/20/2022 12:19:49 PM By: Geralyn Corwin [name] Entered By: Geralyn Corwin on 05/20/2022 11:51:50 -------------------------------------------------------------------------------- Total Contact Cast Details Patient Name: Date of Service: JUNAID, WURZER 05/20/2022 9:00 A M Medical Record Number: [id number] Patient Account Number: [id number] Date of Birth/Sex: Treating RN: Jul 18, 1965 (57 y.o. [name] Primary Care Provider: [name] Other Clinician: Referring Provider: Treating Provider/Extender: Claria Dice in Treatment: 14 T Contact Cast Applied for Wound Assessment: otal Wound #2 Left,Plantar Foot Performed By: Physician Geralyn Corwin, [name] Post Procedure Diagnosis Same as Pre-procedure Electronic Signature([name]) Signed: 05/20/2022 12:19:49 PM By: Geralyn Corwin [name] Signed: 05/20/2022 5:19:30 PM By: Shawn Stall RN, BSN Entered By: Shawn Stall on 05/20/2022 10:04:03 -------------------------------------------------------------------------------- SuperBill Details Patient Name: Date of Service: GAIGE, SEBO 05/20/2022 Medical Record Number: [id number] Patient Account Number: [id number] Date of Birth/Sex: Treating RN: 02-23-1965 (57 y.o. [name] Primary Care Provider: [name] Other Clinician: Referring Provider: Treating Provider/Extender: Claria Dice in Treatment: 14 Diagnosis Coding ICD-10 Codes Code Description [phone] 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 G47.33 Obstructive sleep apnea (adult) (pediatric) I87.2 Venous insufficiency (chronic) (peripheral) Facility Procedures : CPT4 Code:  [id number] Description: [phone] - APPLY TOTAL CONTACT LEG CAST ICD-10 Diagnosis Description L97.522 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 Modifier: Quantity: 1 Physician Procedures : CPT4 Code Description Modifier [id number] - WC PHYS APPLY TOTAL CONTACT CAST ICD-10 Diagnosis Description L97.522 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  Aspinall, Brallan [name] ([id number]) [id number] P Quantity: 1 age 16 of 6 Electronic Signature([name]) Signed: 05/20/2022 12:19:49 PM By: Geralyn Corwin [name] Entered By: Geralyn Corwin on 05/20/2022 11:53:58

## 2022-05-22 NOTE — Progress Notes (Signed)
DETRELL, UMSCHEID [name] ([id number]) [id number] Page 1 of 7 Visit Report for 05/20/2022 Arrival Information Details Patient Name: Date of Service: [name], [name] 05/20/2022 9:00 A M Medical Record Number: [id number] Patient Account Number: [id number] Date of Birth/Sex: Treating RN: 03-Feb-1965 (57 y.o. M) Primary Care Maud Rubendall: [name] Other Clinician: Referring Kayin Osment: Treating Makenzye Troutman/Extender: Claria Dice in Treatment: 14 Visit Information History Since Last Visit Added or deleted any medications: No Patient Arrived: Ambulatory Any new allergies or adverse reactions: No Arrival Time: 09:33 Had a fall or experienced change in No Accompanied By: self activities of daily living that may affect Transfer Assistance: None risk of falls: Patient Identification Verified: Yes Signs or symptoms of abuse/neglect since last No Secondary Verification Process Completed: Yes visito Patient Requires Transmission-Based No Hospitalized since last visit: No Precautions: Implantable device outside of the clinic No Patient Has Alerts: Yes excluding Patient Alerts: 11/27/21 ABI L 1.26 R1.09 cellular tissue based products placed in the 11/27/2021 TBI L0.72 center R0.66 since last visit: Has Dressing in Place as Prescribed: Yes Has Footwear/Offloading in Place as Yes Prescribed: Left: Removable Cast Walker/Walking Boot T Contact Cast otal Pain Present Now: No Electronic Signature([name]) Signed: 05/21/2022 4:04:07 PM By: Thayer Dallas Entered By: Thayer Dallas on 05/20/2022 09:34:48 -------------------------------------------------------------------------------- Encounter Discharge Information Details Patient Name: Date of Service: [name]. 05/20/2022 9:00 A M Medical Record Number: [id number] Patient Account Number: [id number] Date of Birth/Sex: Treating RN: [birth year] (57 y.o. Tammy Sours Primary Care Ticia Virgo:  [name] Other Clinician: Referring Paysley Poplar: Treating Brightyn Mozer/Extender: Claria Dice in Treatment: 14 Encounter Discharge Information Items Discharge Condition: Stable Ambulatory Status: Ambulatory Discharge Destination: Home Transportation: Private Auto Accompanied By: self Schedule Follow-up Appointment: Yes Clinical Summary of Care: Electronic Signature([name]) Signed: 05/20/2022 5:19:30 PM By: Shawn Stall RN, BSN Entered By: Shawn Stall on 05/20/2022 10:06:36 -------------------------------------------------------------------------------- Lower Extremity Assessment Details Patient Name: Date of Service: [name]. 05/20/2022 9:00 A M Medical Record Number: [id number] Patient Account Number: [id number] Date of Birth/Sex: Treating RN: [birth year] (57 y.o. M) Primary Care Wong Steadham: [name] Other Clinician: GEOVANNI, RAHMING ([id number]) [id number] Page 2 of 7 Referring Kaiesha Tonner: Treating Paullette Mckain/Extender: Marcelle Smiling, Ajith Weeks in Treatment: 14 Edema Assessment Assessed: [Left: No] [Right: No] Edema: [Left: Ye] [Right: [name]] Calf Left: Right: Point of Measurement: 43 cm From Medial Instep 40 cm Ankle Left: Right: Point of Measurement: 11 cm From Medial Instep 28 cm Electronic Signature([name]) Signed: 05/21/2022 4:04:07 PM By: Thayer Dallas Entered By: Thayer Dallas on 05/20/2022 09:51:00 -------------------------------------------------------------------------------- Multi Wound Chart Details Patient Name: Date of Service: [name]. 05/20/2022 9:00 A M Medical Record Number: [id number] Patient Account Number: [id number] Date of Birth/Sex: Treating RN: [birth year] (57 y.o. M) Primary Care Avaiyah Strubel: [name] Other Clinician: Referring Camerin Ladouceur: Treating Gabrien Mentink/Extender: Claria Dice in Treatment: 14 Vital  Signs Height(in): 75 Pulse(bpm): 68 Weight(lbs): 220 Blood Pressure(mmHg): 137/73 Body Mass Index(BMI): 27.5 Temperature(F): 98.5 Respiratory Rate(breaths/min): 18 [2:Photos:] [N/A:N/A] Left, Plantar Foot N/A N/A Wound Location: Surgical Injury N/A N/A Wounding Event: Neuropathic Ulcer-Non Diabetic N/A N/A Primary Etiology: Lymphedema, Sleep Apnea, N/A N/A Comorbid History: Hypertension, Peripheral Venous Disease, Osteoarthritis, Neuropathy 01/14/2022 N/A N/A Date Acquired: 14 N/A N/A Weeks of Treatment: Open N/A N/A Wound Status: No N/A N/A Wound Recurrence: Yes N/A N/A Pending A mputation on Presentation: 0.1x0.1x0.1 N/A N/A Measurements L x W x D (cm) 0.008 N/A N/A A (cm) : rea 0.001 N/A N/A Volume (cm) : 93.20% N/A N/A %  Reduction in A rea: 95.80% N/A N/A % Reduction in Volume: Full Thickness Without Exposed N/A N/A Classification: Support Structures Medium N/A N/A Exudate Amount: Serosanguineous N/A N/A Exudate Type: red, brown N/A N/A Exudate Color: Distinct, outline attached N/A N/A Wound Margin: Large (67-100%) N/A N/A Granulation Amount: Red, Pink N/A N/A Granulation QualityHATIM, [name] ([id number]) [phone].pdf Page 3 of 7 None Present (0%) N/A N/A Necrotic Amount: Fat Layer (Subcutaneous Tissue): Yes N/A N/A Exposed Structures: Fascia: No Tendon: No Muscle: No Joint: No Bone: No Large (67-100%) N/A N/A Epithelialization: Callus: Yes N/A N/A Periwound Skin Texture: Excoriation: No Induration: No Crepitus: No Rash: No Scarring: No Maceration: No N/A N/A Periwound Skin Moisture: Dry/Scaly: No Atrophie Blanche: No N/A N/A Periwound Skin Color: Cyanosis: No Ecchymosis: No Erythema: No Hemosiderin Staining: No Mottled: No Pallor: No Rubor: No T Contact Cast otal N/A N/A Procedures Performed: Treatment Notes Wound #2 (Foot) Wound Laterality: Plantar, Left Cleanser Wound  Cleanser Discharge Instruction: Cleanse the wound with wound cleanser prior to applying a clean dressing using gauze sponges, not tissue or cotton balls. Byram Ancillary Kit - 15 Day Supply Discharge Instruction: Use supplies as instructed; Kit contains: (15) Saline Bullets; (15) 3x3 Gauze; 15 pr Gloves Peri-Wound Care Topical Primary Dressing Aquacel Ag Advantage Hydrofiber Rope w/silver, 0.39x18 (in/in) Secondary Dressing Optifoam Non-Adhesive Dressing, 4x4 in Discharge Instruction: Apply x1 layer donut to aid in offloading. Woven Gauze Sponges 2x2 in Discharge Instruction: Apply over primary dressing as directed. Secured With 50M Medipore H Soft Cloth Surgical T ape, 4 x 10 (in/yd) Discharge Instruction: Secure with tape as directed. Compression Wrap Compression Stockings Add-Ons Electronic Signature([name]) Signed: 05/20/2022 12:19:49 PM By: Geralyn Corwin DO Entered By: Geralyn Corwin on 05/20/2022 11:51:06 -------------------------------------------------------------------------------- Multi-Disciplinary Care Plan Details Patient Name: Date of Service: [name]. 05/20/2022 9:00 A M Medical Record Number: [id number] Patient Account Number: [id number] Date of Birth/Sex: Treating RN: 06-08-1965 (56 y.o. Tammy Sours Primary Care Budd Freiermuth: [name] Other Clinician: Referring Hollin Crewe: Treating Dannie Woolen/Extender: Claria Dice in Treatment: [address], Izaah [name] ([id number]) [id number] Page 4 of 7 Peripheral Neuropathy Nursing Diagnoses: Potential alteration in peripheral tissue perfusion (select prior to confirmation of diagnosis) Goals: Patient/caregiver will verbalize understanding of disease process and disease management Date Initiated: 02/11/2022 Target Resolution Date: 05/30/2022 Goal Status: Active Interventions: Assess signs and symptoms of neuropathy upon admission and as  needed Provide education on Management of Neuropathy upon discharge from the Wound Center Treatment Activities: Patient referred for customized footwear/offloading : 02/11/2022 Notes: Electronic Signature([name]) Signed: 05/20/2022 5:19:30 PM By: Shawn Stall RN, BSN Entered By: Shawn Stall on 05/20/2022 09:39:55 -------------------------------------------------------------------------------- Pain Assessment Details Patient Name: Date of Service: [name]. 05/20/2022 9:00 A M Medical Record Number: [id number] Patient Account Number: [id number] Date of Birth/Sex: Treating RN: 03-Nov-1965 (57 y.o. M) Primary Care Jerrid Forgette: [name] Other Clinician: Referring Kenshin Splawn: Treating Bradley Bostelman/Extender: Claria Dice in Treatment: 14 Active Problems Location of Pain Severity and Description of Pain Patient Has Paino No Site Locations Pain Management and Medication Current Pain Management: Electronic Signature([name]) Signed: 05/21/2022 4:04:07 PM By: Thayer Dallas Entered By: Thayer Dallas on 05/20/2022 09:38:07 -------------------------------------------------------------------------------- Patient/Caregiver Education Details Patient Name: Date of Service: [name], [name]. 4/23/2024andnbsp9:00 A M Medical Record Number: [id number] Patient Account Number: [id number] Date of Birth/Gender: Treating RN: 1965/07/20 (57 y.o. [name], [name], [name] ([id number]) [id number] Page 5 of 7 Primary Care Physician: [name] Other Clinician: Referring Physician: Treating Physician/Extender: Geralyn Corwin  [name] Weeks in Treatment: 14 Education Assessment Education Provided To: Patient Education Topics Provided Wound/Skin Impairment: Handouts: Caring for Your Ulcer Methods: Explain/Verbal Responses: Reinforcements needed Electronic Signature([name]) Signed: 05/20/2022 5:19:30 PM By: Shawn Stall  RN, BSN Entered By: Shawn Stall on 05/20/2022 09:40:11 -------------------------------------------------------------------------------- Wound Assessment Details Patient Name: Date of Service: [name]. 05/20/2022 9:00 A M Medical Record Number: [id number] Patient Account Number: [id number] Date of Birth/Sex: Treating RN: 1965-03-13 (57 y.o. M) Primary Care Cayden Granholm: [name] Other Clinician: Referring Rastus Borton: Treating Macgregor Aeschliman/Extender: Tonita Phoenix Weeks in Treatment: 14 Wound Status Wound Number: 2 Primary Neuropathic Ulcer-Non Diabetic Etiology: Wound Location: Left, Plantar Foot Wound Open Wounding Event: Surgical Injury Status: Date Acquired: 01/14/2022 Comorbid Lymphedema, Sleep Apnea, Hypertension, Peripheral Venous Weeks Of Treatment: 14 History: Disease, Osteoarthritis, Neuropathy Clustered Wound: No Pending Amputation On Presentation Photos Wound Measurements Length: (cm) 0.1 Width: (cm) 0.1 Depth: (cm) 0.1 Area: (cm) 0.008 Volume: (cm) 0.001 % Reduction in Area: 93.2% % Reduction in Volume: 95.8% Epithelialization: Large (67-100%) Tunneling: No Undermining: No Wound Description Classification: Full Thickness Without Exposed Suppor Wound Margin: Distinct, outline attached Exudate Amount: Medium Exudate Type: Serosanguineous Exudate Color: red, brown t Structures Foul Odor After Cleansing: No Slough/Fibrino Yes Wound Bed Granulation Amount: Large (67-100%) Exposed [name], [name] ([id number]) [phone].pdf Page 6 of 7 Granulation Quality: Red, Pink Fascia Exposed: No Necrotic Amount: None Present (0%) Fat Layer (Subcutaneous Tissue) Exposed: Yes Tendon Exposed: No Muscle Exposed: No Joint Exposed: No Bone Exposed: No Periwound Skin Texture Texture Color No Abnormalities Noted: No No Abnormalities Noted: No Callus: Yes Atrophie Blanche: No Crepitus: No Cyanosis:  No Excoriation: No Ecchymosis: No Induration: No Erythema: No Rash: No Hemosiderin Staining: No Scarring: No Mottled: No Pallor: No Moisture Rubor: No No Abnormalities Noted: No Dry / Scaly: No Maceration: No Treatment Notes Wound #2 (Foot) Wound Laterality: Plantar, Left Cleanser Wound Cleanser Discharge Instruction: Cleanse the wound with wound cleanser prior to applying a clean dressing using gauze sponges, not tissue or cotton balls. Byram Ancillary Kit - 15 Day Supply Discharge Instruction: Use supplies as instructed; Kit contains: (15) Saline Bullets; (15) 3x3 Gauze; 15 pr Gloves Peri-Wound Care Topical Primary Dressing Aquacel Ag Advantage Hydrofiber Rope w/silver, 0.39x18 (in/in) Secondary Dressing Optifoam Non-Adhesive Dressing, 4x4 in Discharge Instruction: Apply x1 layer donut to aid in offloading. Woven Gauze Sponges 2x2 in Discharge Instruction: Apply over primary dressing as directed. Secured With 70M Medipore H Soft Cloth Surgical T ape, 4 x 10 (in/yd) Discharge Instruction: Secure with tape as directed. Compression Wrap Compression Stockings Add-Ons Electronic Signature([name]) Signed: 05/21/2022 4:04:07 PM By: Thayer Dallas Entered By: Thayer Dallas on 05/20/2022 09:52:59 -------------------------------------------------------------------------------- Vitals Details Patient Name: Date of Service: [name]. 05/20/2022 9:00 A M Medical Record Number: [id number] Patient Account Number: [id number] Date of Birth/Sex: Treating RN: 11-Feb-1965 (57 y.o. M) Primary Care Melodee Lupe: [name] Other Clinician: Referring Abiageal Blowe: Treating Saya Mccoll/Extender: Claria Dice in Treatment: 14 Vital Signs Time Taken: 09:37 Temperature (F): 98.5 Height (in): 75 Pulse (bpm): 68 Weight (lbs): 220 Respiratory Rate (breaths/min): 18 [name], [name] ([id number]) [phone].pdf Page 7 of 7 Body Mass  Index (BMI): 27.5 Blood Pressure (mmHg): 137/73 Reference Range: 80 - 120 mg / dl Electronic Signature([name]) Signed: 05/21/2022 4:04:07 PM By: Thayer Dallas Entered By: Thayer Dallas on 05/20/2022 09:37:58

## 2022-05-27 ENCOUNTER — Encounter (HOSPITAL_BASED_OUTPATIENT_CLINIC_OR_DEPARTMENT_OTHER): Payer: BC Managed Care – PPO | Admitting: Internal Medicine

## 2022-05-27 DIAGNOSIS — L97522 Non-pressure chronic ulcer of other part of 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: Secondary | ICD-10-CM

## 2022-05-27 DIAGNOSIS — G4733 Obstructive sleep apnea (adult) (pediatric): Secondary | ICD-10-CM

## 2022-05-27 DIAGNOSIS — I872 Venous insufficiency (chronic) (peripheral): Secondary | ICD-10-CM | POA: Diagnosis not present

## 2022-05-27 DIAGNOSIS — G9009 Other idiopathic peripheral autonomic neuropathy: Secondary | ICD-10-CM

## 2022-05-28 NOTE — Progress Notes (Signed)
[name], John S ([id number]) [id number] Page 1 of 6 Visit Report for 05/27/2022 Chief Complaint Document Details Patient Name: Date of Service: CATON, POPOWSKI 05/27/2022 9:30 A M Medical Record Number: [id number] Patient Account Number: [id number] Date of Birth/Sex: Treating RN: Oct 04, 1965 (57 y.o. M) Primary Care Provider: [name] Other Clinician: Referring Provider: Treating Provider/Extender: Claria Dice in Treatment: 15 Information Obtained from: Patient Chief Complaint 02/11/2022 left great toe wound and fourth sub-met head wound Electronic Signature(s) Signed: 05/27/2022 11:15:37 AM By: Geralyn Corwin DO Entered By: Geralyn Corwin on 05/27/2022 10:33:20 -------------------------------------------------------------------------------- HPI Details Patient Name: Date of Service: [name]. 05/27/2022 9:30 A M Medical Record Number: [id number] Patient Account Number: [id number] Date of Birth/Sex: Treating RN: 28-Apr-1965 (57 y.o. M) Primary Care Provider: [name] Other Clinician: Referring Provider: Treating Provider/Extender: Claria Dice in Treatment: 15 History of Present Illness HPI Description: 02/11/2022 [name] is a 57 year old male with a past medical history of current tobacco user, OSA, venous insufficiency and peripheral neuropathy that presents the clinic for a 2-week history of ulcer to the left great toe and 2-81-month history of nonhealing ulcer to the left plantar foot. 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 He wears steel toed boots. He is currently out of work due to his wounds. On 11/26/2021 he was admitted to the hospital for cellulitis and abscess to the left foot. 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 He states he has been on doxycycline for 10 weeks. He just completed this  course. He uses regular tennis shoes for walking. He is not offloading the area. He is using Betadine for wound dressings. He has been following with Dr. Allena Katz, podiatry who Referred to our clinic. 1/23; patient presents for follow-up. He has been using Medihoney and Hydrofera Blue to the wound beds Along with his surgical shoe and offloading peg assist. He has no issues or complaints today. He denies signs of infection. 1/30; patient presents for follow-up. He has been using Medihoney and Hydrofera Blue to the wound beds. He has no issues or complaints today. Plan is for the total contact cast and patient is agreeable to move forward with this. 2/1; patient presents for follow-up. We have been using antibiotic ointment with Hydrofera Blue to the wound beds under the total contact cast. He presents today for his obligatory cast change. He had no issues with the cast. 2/6; patient presents for follow-up. We have been using antibiotic ointment with Hydrofera Blue to the wound beds under the total contact cast. He has no issues or complaints today. He has tolerated the cast well. 2/13; patient presents for follow-up. We have been using the total contact cast to the left lower extremity. We have been using Hydrofera Blue and antibiotic ointment to the wound beds. He has no issues or complaints today. 2/20; patient presents for follow-up. We were using the total contact cast along with Hydrofera Blue and antibiotic ointment. He states the cast was slightly uncomfortable over the past week. 2/27; Patient presents for follow-up. We have been using Hydrofera Blue with antibiotic ointment under the total contact cast. He tolerated the cast well and has no issues or complaints today. 3/5; Patient presents for follow-up. We have been using Hydrofera Blue with antibiotic ointment under the total contact cast. He tolerated the cast well and has no issues or complaints today. 3/13; patient presents for  follow-up. We have been using Hydrofera  Blue with collagen to the left plantar foot wound and Hydrofera Blue with antibiotic ointment to the left toe wound all under the total contact cast. He had no issues or complaints today. 3/19; patient presents for follow-up. We have been using Hydrofera Blue with collagen to the left plantar foot wound and antibiotic ointment with Hydrofera Blue to the left great toe wound. All under the total contact cast. The toe wound is healed. 3/26; patient presents for follow-up. We have been using Hydrofera Blue and collagen to the plantar foot wound. The left great toe wound remains healed. We have been doing this under the total contact cast. 04/29/2022: There is just a small open area at the base of the wound, which is otherwise clean. His periwound skin is fairly macerated. [name], John S ([id number]) [id number] Page 2 of 6 4/9; patient presents for follow-up. We have been using Hydrofera Blue and antibiotic ointment under the total contact cast. He is tolerated the cast well. He has no issues or complaints today. 4/16; patient presents for follow-up. We have been using silver alginate with antibiotic ointment under the total contact cast. He has tolerated this well. The wound is well-healing. 4/23; we have been using antibiotic ointment with Aquacel Ag under the total contact cast. His wound is almost healed. 4/30; we have been using Aquacel Ag under the total contact cast to the left lower extremity. The plantar foot wound has healed. Electronic Signature(s) Signed: 05/27/2022 11:15:37 AM By: Geralyn Corwin DO Entered By: Geralyn Corwin on 05/27/2022 10:33:52 -------------------------------------------------------------------------------- Physical Exam Details Patient Name: Date of Service: [name], [name] 05/27/2022 9:30 A M Medical Record Number: [id number] Patient Account Number: [id number] Date of Birth/Sex: Treating  RN: 07/29/1965 (57 y.o. M) Primary Care Provider: [name] Other Clinician: Referring Provider: Treating Provider/Extender: Claria Dice in Treatment: 15 Constitutional respirations regular, non-labored and within target range for patient.. Cardiovascular 2+ dorsalis pedis/posterior tibialis pulses. Psychiatric pleasant and cooperative. Notes Epithelization to the plantar aspect of the left foot. Electronic Signature(s) Signed: 05/27/2022 11:15:37 AM By: Geralyn Corwin DO Entered By: Geralyn Corwin on 05/27/2022 10:34:29 -------------------------------------------------------------------------------- Physician Orders Details Patient Name: Date of Service: DAMANI, KELEMEN 05/27/2022 9:30 A M Medical Record Number: [id number] Patient Account Number: [id number] Date of Birth/Sex: Treating RN: 07/10/1965 (58 y.o. Charlean Merl, [name] Primary Care Provider: [name] Other Clinician: Referring Provider: Treating Provider/Extender: Claria Dice in Treatment: 15 Verbal / Phone Orders: No Diagnosis Coding Discharge From [facility] Services Discharge from Wound Care Center Edema Control - Lymphedema / SCD / Other Elevate legs to the level of the heart or above for 30 minutes daily and/or when sitting for 3-4 times a day throughout the day. Avoid standing for long periods of time. Off-Loading Open toe surgical shoe to: - with peg assist; wear orthotics in steel toe shoes too Electronic Signature(s) Signed: 05/27/2022 11:15:37 AM By: Geralyn Corwin DO Entered By: Geralyn Corwin on 05/27/2022 10:34:36 Suhr, Salil S ([id number]) [id number] Page 3 of 6 -------------------------------------------------------------------------------- Problem List Details Patient Name: Date of Service: TYR, FRANCA 05/27/2022 9:30 A M Medical Record Number: [id number] Patient Account  Number: [id number] Date of Birth/Sex: Treating RN: [birth year] (57 y.o. M) Primary Care Provider: [name] Other Clinician: Referring Provider: Treating Provider/Extender: Claria Dice in Treatment: 15 Active Problems ICD-10 Encounter Code Description Active Date MDM Diagnosis [phone] Non-pressure chronic ulcer of other part of 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 02/11/2022 No Yes G90.09 Other idiopathic peripheral  autonomic neuropathy 02/11/2022 No Yes G47.33 Obstructive sleep apnea (adult) (pediatric) 02/11/2022 No Yes I87.2 Venous insufficiency (chronic) (peripheral) 02/11/2022 No Yes Inactive Problems Resolved Problems Electronic Signature(s) Signed: 05/27/2022 11:15:37 AM By: Geralyn Corwin DO Entered By: Geralyn Corwin on 05/27/2022 10:33:05 -------------------------------------------------------------------------------- Progress Note Details Patient Name: Date of Service: [name]. 05/27/2022 9:30 A M Medical Record Number: [id number] Patient Account Number: [id number] Date of Birth/Sex: Treating RN: [birth year] (57 y.o. M) Primary Care Provider: [name] Other Clinician: Referring Provider: Treating Provider/Extender: Claria Dice in Treatment: 15 Subjective Chief Complaint Information obtained from Patient 02/11/2022 left great toe wound and fourth sub-met head wound History of Present Illness (HPI) 02/11/2022 [name] is a 57 year old male with a past medical history of current tobacco user, OSA, venous insufficiency and peripheral neuropathy that presents the clinic for a 2-week history of ulcer to the left great toe and 2-46-month history of nonhealing ulcer to the left plantar foot. 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 He wears steel toed boots. He is currently out of work due to his wounds. On 11/26/2021 he was admitted to the hospital for cellulitis and  abscess to the left foot. 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 He states he has been on doxycycline for 10 weeks. He just completed this course. He uses regular tennis shoes for walking. He is not offloading the area. He is using Betadine for wound dressings. He has been following with Dr. Allena Katz, podiatry who Referred to our clinic. 1/23; patient presents for follow-up. He has been using Medihoney and Hydrofera Blue to the wound beds Along with his surgical shoe and offloading peg assist. He has no issues or complaints today. He denies signs of infection. 1/30; patient presents for follow-up. He has been using Medihoney and Hydrofera Blue to the wound beds. He has no issues or complaints today. Plan is for the total contact cast and patient is agreeable to move forward with this. 2/1; patient presents for follow-up. We have been using antibiotic ointment with Hydrofera Blue to the wound beds under the total contact cast. He presents today for his obligatory cast change. He had no issues with the cast. AMOR, PACKARD S ([id number]) [id number] Page 4 of 6 2/6; patient presents for follow-up. We have been using antibiotic ointment with Hydrofera Blue to the wound beds under the total contact cast. He has no issues or complaints today. He has tolerated the cast well. 2/13; patient presents for follow-up. We have been using the total contact cast to the left lower extremity. We have been using Hydrofera Blue and antibiotic ointment to the wound beds. He has no issues or complaints today. 2/20; patient presents for follow-up. We were using the total contact cast along with Hydrofera Blue and antibiotic ointment. He states the cast was slightly uncomfortable over the past week. 2/27; Patient presents for follow-up. We have been using Hydrofera Blue with antibiotic ointment under the total contact cast. He tolerated the cast well and has no issues  or complaints today. 3/5; Patient presents for follow-up. We have been using Hydrofera Blue with antibiotic ointment under the total contact cast. He tolerated the cast well and has no issues or complaints today. 3/13; patient presents for follow-up. We have been using Hydrofera Blue with collagen to the left plantar foot wound and Hydrofera Blue with antibiotic ointment to the left toe wound all under the total contact cast. He had no  issues or complaints today. 3/19; patient presents for follow-up. We have been using Hydrofera Blue with collagen to the left plantar foot wound and antibiotic ointment with Hydrofera Blue to the left great toe wound. All under the total contact cast. The toe wound is healed. 3/26; patient presents for follow-up. We have been using Hydrofera Blue and collagen to the plantar foot wound. The left great toe wound remains healed. We have been doing this under the total contact cast. 04/29/2022: There is just a small open area at the base of the wound, which is otherwise clean. His periwound skin is fairly macerated. 4/9; patient presents for follow-up. We have been using Hydrofera Blue and antibiotic ointment under the total contact cast. He is tolerated the cast well. He has no issues or complaints today. 4/16; patient presents for follow-up. We have been using silver alginate with antibiotic ointment under the total contact cast. He has tolerated this well. The wound is well-healing. 4/23; we have been using antibiotic ointment with Aquacel Ag under the total contact cast. His wound is almost healed. 4/30; we have been using Aquacel Ag under the total contact cast to the left lower extremity. The plantar foot wound has healed. Patient History Family History Heart Disease - Mother, Lung Disease - Mother. Social History Current every day smoker - 10 cig a day, Alcohol Use - Never, Drug Use - No History, Caffeine Use - Never. Medical  History Hematologic/Lymphatic Patient has history of Lymphedema - pumps use daily Respiratory Patient has history of Sleep Apnea - CPAP Cardiovascular Patient has history of Hypertension, Peripheral Venous Disease - needs ablation on left leg Musculoskeletal Patient has history of Osteoarthritis Denies history of Osteomyelitis Neurologic Patient has history of Neuropathy Hospitalization/Surgery History - 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. Medical A Surgical History Notes nd Musculoskeletal abscess to left foot 3rd toe 11/28/2022 Objective Constitutional respirations regular, non-labored and within target range for patient.. Vitals Time Taken: 9:56 AM, Height: 75 in, Weight: 220 lbs, BMI: 27.5, Temperature: 98.9 F, Pulse: 78 bpm, Respiratory Rate: 18 breaths/min, Blood Pressure: 148/87 mmHg. Cardiovascular 2+ dorsalis pedis/posterior tibialis pulses. Psychiatric pleasant and cooperative. General Notes: Epithelization to the plantar aspect of the left foot. KHALE, NIGH S ([id number]) [id number] Page 5 of 6 Integumentary (Hair, Skin) Wound #2 status is Healed - Epithelialized. Original cause of wound was Surgical Injury. The date acquired was: 01/14/2022. The wound has been in treatment 15 weeks. The wound is located on the Left,Plantar Foot. The wound measures 0cm length x 0cm width x 0cm depth; 0cm^2 area and 0cm^3 volume. There is Fat Layer (Subcutaneous Tissue) exposed. There is no tunneling or undermining noted. There is a medium amount of serosanguineous drainage noted. The wound margin is distinct with the outline attached to the wound base. There is large (67-100%) red, pink granulation within the wound bed. There is no necrotic tissue within the wound bed. The periwound skin appearance exhibited: Callus. The periwound skin appearance did not exhibit: Crepitus, Excoriation, Induration, Rash, Scarring, Dry/Scaly, Maceration, Atrophie  Blanche, Cyanosis, Ecchymosis, Hemosiderin Staining, Mottled, Pallor, Rubor, Erythema. 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 His wound is healed. At this time I recommended continuing with his surgical shoe and peg assist for the next 1 to 2 weeks to assure further healing. He has  an orthotic in his steel toed boots for work. I recommended at last clinic visit he follow-up with [facility] to reassess his orthotics. He states he is going to do this soon. He may follow-up as needed In our clinic. Plan Discharge From [facility] Services: Discharge from Wound Care Center Edema Control - Lymphedema / SCD / Other: Elevate legs to the level of the heart or above for 30 minutes daily and/or when sitting for 3-4 times a day throughout the day. Avoid standing for long periods of time. Off-Loading: Open toe surgical shoe to: - with peg assist; wear orthotics in steel toe shoes too 1. Discharge from clinic due to closed wound 2. Follow-up as needed 3. Surgical shoe with peg assist for the next 1 to 2 weeks 4. Follow-up to try to for orthotics Electronic Signature(s) Signed: 05/27/2022 11:15:37 AM By: Geralyn Corwin DO Entered By: Geralyn Corwin on 05/27/2022 10:35:50 -------------------------------------------------------------------------------- HxROS Details Patient Name: Date of Service: [name]. 05/27/2022 9:30 A M Medical Record Number: [id number] Patient Account Number: [id number] Date of Birth/Sex: Treating RN: 07-Sep-1965 (57 y.o. M) Primary Care Provider: [name] Other Clinician: Referring Provider: Treating Provider/Extender: Claria Dice in Treatment:  15 Hematologic/Lymphatic Medical History: Positive for: Lymphedema - pumps use daily Respiratory Medical History: Positive for: Sleep Apnea - CPAP Cardiovascular Medical History: Positive for: Hypertension; Peripheral Venous Disease - needs ablation on left leg Picado, Shondell S ([id number]) [id number] Page 6 of 6 Musculoskeletal Medical History: Positive for: Osteoarthritis Negative for: Osteomyelitis Past Medical History Notes: abscess to left foot 3rd toe 11/28/2022 Neurologic Medical History: Positive for: Neuropathy Immunizations Implantable Devices No devices added Hospitalization / Surgery History Type of Hospitalization/Surgery 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 Family and Social History Heart Disease: Yes - Mother; Lung Disease: Yes - Mother; Current every day smoker - 10 cig a day; Alcohol Use: Never; Drug Use: No History; Caffeine Use: Never; Financial Concerns: No; Food, Clothing or Shelter Needs: No; Support System Lacking: No; Transportation Concerns: No Electronic Signature(s) Signed: 05/27/2022 11:15:37 AM By: Geralyn Corwin DO Entered By: Geralyn Corwin on 05/27/2022 10:34:05 -------------------------------------------------------------------------------- SuperBill Details Patient Name: Date of Service: [name] 05/27/2022 Medical Record Number: [id number] Patient Account Number: [id number] Date of Birth/Sex: Treating RN: [birth year] (57 y.o. M) Primary Care Provider: [name] Other Clinician: Referring Provider: Treating Provider/Extender: Claria Dice in Treatment: 15 Diagnosis Coding ICD-10 Codes Code Description [id number] 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 G47.33 Obstructive sleep apnea (adult) (pediatric) I87.2 Venous insufficiency (chronic)  (peripheral) Physician Procedures : CPT4 Code Description Modifier [id number] - WC PHYS LEVEL 3 - EST PT ICD-10 Diagnosis Description L97.522 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 G47.33  Obstructive sleep apnea (adult) (pediatric) I87.2 Venous insufficiency (chronic) (peripheral) Quantity: 1 Electronic Signature(s) Signed: 05/27/2022 11:15:37 AM By: Geralyn Corwin DO Entered By: Geralyn Corwin on 05/27/2022 10:36:13

## 2022-05-29 NOTE — Progress Notes (Signed)
[name] ([id number]) [id number] Page 1 of 5 Visit Report for 05/27/2022 Arrival Information Details Patient Name: Date of Service: [name], [name] 05/27/2022 9:30 A M Medical Record Number: [id number] Patient Account Number: [id number] Date of Birth/Sex: Treating RN: [birth year] (57 y.o. M) Primary Care [name]: [name] Other Clinician: Referring [name]: Treating [name]/Extender: [name] in Treatment: 15 Visit Information History Since Last Visit Added or deleted any medications: No Patient Arrived: Ambulatory Any new allergies or adverse reactions: No Arrival Time: 09:53 Had a fall or experienced change in No Accompanied By: self activities of daily living that may affect Transfer Assistance: None risk of falls: Patient Identification Verified: Yes Signs or symptoms of abuse/neglect since last No Secondary Verification Process Completed: Yes visito Patient Requires Transmission-Based No Hospitalized since last visit: No Precautions: Implantable device outside of the clinic No Patient Has Alerts: Yes excluding Patient Alerts: 11/27/21 ABI L 1.26 R1.09 cellular tissue based products placed in the 11/27/2021 TBI L0.72 center R0.66 since last visit: Has Dressing in Place as Prescribed: Yes Has Footwear/Offloading in Place as Yes Prescribed: Left: Removable Cast Walker/Walking Boot T Contact Cast otal Pain Present Now: No Electronic Signature([name]) Signed: 05/28/2022 4:40:31 PM By: [name] Entered By: [name] on 05/27/2022 09:54:31 -------------------------------------------------------------------------------- Lower Extremity Assessment Details Patient Name: Date of Service: [name], [name] 05/27/2022 9:30 A M Medical Record Number: [id number] Patient Account Number: [id number] Date of Birth/Sex: Treating RN: [birth year] (57 y.o. M) Primary Care Willadeen Colantuono: [name]  Other Clinician: Referring [name]: Treating [name]/Extender: [name] Weeks in Treatment: 15 Edema Assessment Assessed: [Left: No] [name]: No] Edema: [Left: Ye] [Right: [name]] Calf Left: Right: Point of Measurement: 43 cm From Medial Instep 41 cm Ankle Left: Right: Point of Measurement: 11 cm From Medial Instep 26.6 cm Electronic Signature([name]) Signed: 05/28/2022 4:40:31 PM By: [name] Entered By: [name] on 05/27/2022 10:10:13 Multi Wound Chart Details -------------------------------------------------------------------------------- [name] ([id number]) [id number] Page 2 of 5 Patient Name: Date of Service: [name] 05/27/2022 9:30 A M Medical Record Number: [id number] Patient Account Number: [id number] Date of Birth/Sex: Treating RN: 06-Nov-1965 (57 y.o. M) Primary Care [name]: [name] Other Clinician: Referring Brandan Robicheaux: Treating [name]/Extender: [name] in Treatment: 15 Vital Signs Height(in): 75 Pulse(bpm): 78 Weight(lbs): 220 Blood Pressure(mmHg): 148/87 Body Mass Index(BMI): 27.5 Temperature(F): 98.9 Respiratory Rate(breaths/min): 18 Wound Assessments Wound Number: 2 N/A N/A Photos: N/A N/A Left, Plantar Foot N/A N/A Wound Location: Surgical Injury N/A N/A Wounding Event: Neuropathic Ulcer-Non Diabetic N/A N/A Primary Etiology: Lymphedema, Sleep Apnea, N/A N/A Comorbid History: Hypertension, Peripheral Venous Disease, Osteoarthritis, Neuropathy 01/14/2022 N/A N/A Date Acquired: 15 N/A N/A Weeks of Treatment: Healed - Epithelialized N/A N/A Wound Status: No N/A N/A Wound Recurrence: Yes N/A N/A Pending A mputation on Presentation: 0x0x0 N/A N/A Measurements L x W x D (cm) 0 N/A N/A A (cm) : rea 0 N/A N/A Volume (cm) : 100.00% N/A N/A % Reduction in A rea: 100.00% N/A N/A % Reduction in Volume: Full  Thickness Without Exposed N/A N/A Classification: Support Structures Medium N/A N/A Exudate Amount: Serosanguineous N/A N/A Exudate Type: red, brown N/A N/A Exudate Color: Distinct, outline attached N/A N/A Wound Margin: Large (67-100%) N/A N/A Granulation Amount: Red, Pink N/A N/A Granulation Quality: None Present (0%) N/A N/A Necrotic Amount: Fat Layer (Subcutaneous Tissue): Yes N/A N/A Exposed Structures: Fascia: No Tendon: No Muscle: No Joint: No Bone: No Large (67-100%) N/A N/A Epithelialization: Callus: Yes N/A N/A  Periwound Skin Texture: Excoriation: No Induration: No Crepitus: No Rash: No Scarring: No Maceration: No N/A N/A Periwound Skin Moisture: Dry/Scaly: No Atrophie Blanche: No N/A N/A Periwound Skin Color: Cyanosis: No Ecchymosis: No Erythema: No Hemosiderin Staining: No Mottled: No Pallor: No Rubor: No Treatment Notes Electronic Signature([name]) Signed: 05/27/2022 11:15:37 AM By: [name], [name] ([id number]) [id number] Page 3 of 5 Entered By: [name] on 05/27/2022 10:33:10 -------------------------------------------------------------------------------- Multi-Disciplinary Care Plan Details Patient Name: Date of Service: [name], [name] 05/27/2022 9:30 A M Medical Record Number: [id number] Patient Account Number: [id number] Date of Birth/Sex: Treating RN: Feb 06, 1965 (57 y.o. [name] Primary Care [name]: [name] Other Clinician: Referring [name]: Treating [name]/Extender: [name] Weeks in Treatment: 15 Active Inactive Electronic Signature([name]) Signed: 05/27/2022 4:08:55 PM By: John Mu RN Entered By: [name] on 05/27/2022 10:27:05 -------------------------------------------------------------------------------- Pain Assessment Details Patient Name: Date of Service: [name], [name] 05/27/2022 9:30 A  M Medical Record Number: [id number] Patient Account Number: [id number] Date of Birth/Sex: Treating RN: [birth year] (57 y.o. M) Primary Care [name]: [name] Other Clinician: Referring [name]: Treating [name]/Extender: [name] in Treatment: 15 Active Problems Location of Pain Severity and Description of Pain Patient Has Paino No Site Locations Pain Management and Medication Current Pain Management: Electronic Signature([name]) Signed: 05/28/2022 4:40:31 PM By: [name] Entered By: [name] on 05/27/2022 09:56:17 -------------------------------------------------------------------------------- Patient/Caregiver Education Details Patient Name: Date of Service: [name] 4/30/2024andnbsp9:30 A M Medical Record Number: [id number] Patient Account Number: [id number] Date of Birth/Gender: Treating RN: [birth year] (57 y.o. [name] Primary Care Physician: [name] Other Clinician: DARIAN, [name] ([id number]) [id number] Page 4 of 5 Referring Physician: Treating Physician/Extender: [name] in Treatment: 15 Education Assessment Education Provided To: Patient Education Topics Provided Wound/Skin Impairment: Methods: Explain/Verbal Responses: State content correctly Electronic Signature([name]) Signed: 05/27/2022 4:08:55 PM By: John Mu RN Entered By: [name] on 05/27/2022 10:27:19 -------------------------------------------------------------------------------- Wound Assessment Details Patient Name: Date of Service: [name] 05/27/2022 9:30 A M Medical Record Number: [id number] Patient Account Number: [id number] Date of Birth/Sex: Treating RN: [birth year] (57 y.o. [name], [name] Primary Care Gracin Mcpartland: [name] Other Clinician: Referring Shiloh Swopes: Treating Pier Bosher/Extender: [name] Weeks in Treatment: 15 Wound Status Wound Number: 2 Primary Neuropathic Ulcer-Non Diabetic Etiology: Wound Location: Left, Plantar Foot Wound Healed - Epithelialized Wounding Event: Surgical Injury Status: Date Acquired: 01/14/2022 Comorbid Lymphedema, Sleep Apnea, Hypertension, Peripheral Venous Weeks Of Treatment: 15 History: Disease, Osteoarthritis, Neuropathy Clustered Wound: No Pending Amputation On Presentation Photos Wound Measurements Length: (cm) Width: (cm) Depth: (cm) Area: (cm) Volume: (cm) 0 % Reduction in Area: 100% 0 % Reduction in Volume: 100% 0 Epithelialization: Large (67-100%) 0 Tunneling: No 0 Undermining: No Wound Description Classification: Full Thickness Without Exposed Suppor Wound Margin: Distinct, outline attached Exudate Amount: Medium Exudate Type: Serosanguineous Exudate Color: red, brown t Structures Foul Odor After Cleansing: No Slough/Fibrino Yes Wound Bed Granulation Amount: Large (67-100%) Exposed Structure Granulation Quality: Red, Pink Fascia Exposed: No Necrotic Amount: None Present (0%) Fat Layer (Subcutaneous Tissue) ExposedGarrison, Michie Ayvin [name] ([id number]) [phone].pdf Page 5 of 5 Tendon Exposed: No Muscle Exposed: No Joint Exposed: No Bone Exposed: No Periwound Skin Texture Texture Color No Abnormalities Noted: No No Abnormalities Noted: No Callus: Yes Atrophie Blanche: No Crepitus: No Cyanosis: No Excoriation: No Ecchymosis: No Induration: No Erythema: No Rash: No Hemosiderin Staining: No Scarring: No Mottled: No Pallor: No Moisture Rubor: No No Abnormalities Noted:  No Dry / Scaly: No Maceration: No Electronic Signature([name]) Signed: 05/27/2022 4:08:55 PM By: John Mu RN Entered By: [name] on 05/27/2022 10:26:51 -------------------------------------------------------------------------------- Vitals Details Patient Name: Date of  Service: [name]. 05/27/2022 9:30 A M Medical Record Number: [id number] Patient Account Number: [id number] Date of Birth/Sex: Treating RN: [birth year] (57 y.o. M) Primary Care Rose Hippler: [name] Other Clinician: Referring Demonta Wombles: Treating Abbygael Curtiss/Extender: [name] in Treatment: 15 Vital Signs Time Taken: 09:56 Temperature (F): 98.9 Height (in): 75 Pulse (bpm): 78 Weight (lbs): 220 Respiratory Rate (breaths/min): 18 Body Mass Index (BMI): 27.5 Blood Pressure (mmHg): 148/87 Reference Range: 80 - 120 mg / dl Electronic Signature([name]) Signed: 05/28/2022 4:40:31 PM By: [name] Entered By: [name] on 05/27/2022 09:56:08

## 2022-06-05 ENCOUNTER — Ambulatory Visit (INDEPENDENT_AMBULATORY_CARE_PROVIDER_SITE_OTHER): Payer: BC Managed Care – PPO | Admitting: Podiatry

## 2022-06-05 DIAGNOSIS — L97521 Non-pressure chronic ulcer of other part of left foot limited to breakdown of skin: Secondary | ICD-10-CM

## 2022-06-05 NOTE — Progress Notes (Signed)
He presents today after having not seen him since last year for follow-up of his left foot.  States that he was hospitalized at 1 point for the ulceration beneath the forefoot which led to an amputation of his third toe.  He presents today with follow-up of a callus deformity in the same spot beneath the metatarsal.  He also goes on to say that he is going to have been worked on his left leg because of the chronic edema.  Objective: Vital signs are stable alert oriented x 3 pulses are palpable.  Moderate edema bilateral post inflammatory hyperpigmentation of his legs venous stasis of the legs no ulcerative lesions in the legs left foot does demonstrate hallux malleus with a distal clavus to the distal phalanx.  He has a reactive hyperkeratotic lesion which is preulcerative in nature but not the third metatarsal head which is still present on palpation.  No erythema cellulitis drainage or odor is noted in either side.  Assessment: Preulcerative lesion plantar aspect of the left foot.  Hallux malleus with a distal clavus.  Plan: At this point we are going to get him scanned for a new set of orthotics with a metatarsal pad and offloading of the third metatarsal head.  He needs to have these areas marked for the scan.

## 2022-06-18 DIAGNOSIS — F1120 Opioid dependence, uncomplicated: Secondary | ICD-10-CM | POA: Diagnosis not present

## 2022-06-19 ENCOUNTER — Encounter: Payer: Self-pay | Admitting: Podiatry

## 2022-06-19 ENCOUNTER — Ambulatory Visit (INDEPENDENT_AMBULATORY_CARE_PROVIDER_SITE_OTHER): Payer: BC Managed Care – PPO | Admitting: Podiatry

## 2022-06-19 DIAGNOSIS — L97521 Non-pressure chronic ulcer of other part of left foot limited to breakdown of skin: Secondary | ICD-10-CM

## 2022-06-23 NOTE — Progress Notes (Signed)
He presents today for follow-up of ulceration plantar aspect of his forefoot left after having seen him earlier this month where he simply had an ulceration to the distal aspect of the left hallux due to a mild malleus.  Now he is presenting with a blister to the plantar aspect of the forefoot.  He states this is how I ended up losing my third toe.  He denies fever chills nausea vomiting muscle aches pains calf pain back pain chest pain shortness of breath.  Objective: Vital signs are stable he is alert and oriented.  Plantar aspect of the third digit demonstrates a distal plantar medial aspect of the hallux which does demonstrate malleus and flexor hallucis longus is tight and almost spasmatic most likely resulting in this problem.  Once this area was debrided did not demonstrate any deeper probing ulcerative lesion.  Forefoot left beneath the third metatarsal.  This area demonstrates a callused area with a distally migrating callus.  This area was debrided today deeply down to the dermis but no subcutaneous tissue was visible and this did not probe deep.  However the third metatarsal head of that left foot is very prominent.  No cellulitic process.  He has lymphedema bilateral lower extremities.  Assessment: Distal ulceration hallux left most likely secondary to flexor hallucis longus spasms or hallux malleus.  Prominent third metatarsal head left foot status post amputation third toe resulting in a new ulcerative lesion.  Plan: I debrided these areas today deeply noting no probing to deep tissues.  He will continue the use of his Betadine solution and Medihoney.  He will continue dressing and I placed him in a Darco wedge.  He is will follow-up with Dr. Lilian Kapur next week.  He may be necessary to remove the third metatarsal head and perform a fusion to the first metatarsal phalangeal joint or interphalangeal joint.

## 2022-06-24 ENCOUNTER — Ambulatory Visit (INDEPENDENT_AMBULATORY_CARE_PROVIDER_SITE_OTHER): Payer: BC Managed Care – PPO | Admitting: Podiatry

## 2022-06-24 ENCOUNTER — Encounter: Payer: Self-pay | Admitting: Podiatry

## 2022-06-24 ENCOUNTER — Ambulatory Visit (INDEPENDENT_AMBULATORY_CARE_PROVIDER_SITE_OTHER): Payer: BC Managed Care – PPO

## 2022-06-24 DIAGNOSIS — L97521 Non-pressure chronic ulcer of other part of left foot limited to breakdown of skin: Secondary | ICD-10-CM

## 2022-06-24 DIAGNOSIS — M205X9 Other deformities of toe(s) (acquired), unspecified foot: Secondary | ICD-10-CM

## 2022-06-24 DIAGNOSIS — L97522 Non-pressure chronic ulcer of other part of 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: Secondary | ICD-10-CM

## 2022-06-24 NOTE — Progress Notes (Signed)
  Subjective:  Patient ID: [name], male    DOB: 08-03-1965,  MRN: [id number]  Chief Complaint  Patient presents with   Foot Ulcer    1 week follow up left foot    57 y.o. male presents with the above complaint. History confirmed with patient.  He previously developed an ulceration of his third toe on the left foot last fall.  This became necrotic and had significant infection and he was admitted to the hospital for intervention, underwent third toe amputation.  Had persistent callus formation beneath this, has seen wound care in order to heal this and then has had the callus debrided with Dr. Al Corpus recently.  He is not diabetic.  He does smoke about a half to full pack a day.  He has since developed ulceration on the great toe.  He is here to see me today for surgical consultation if there is options to reconstruct the first ray to reduce his risk of ulceration.  Objective:  Physical Exam: warm, good capillary refill, normal sensory exam, and +2 pitting edema, difficult to palpate his pulses secondary to his edema and venous insufficiency with hemosiderosis noted, there is full-thickness ulceration on the distal hallux, healed ulcer and callus plantar metatarsal 3.  No overt signs of infection no cellulitis purulence or drainage.  Exposed subcutaneous tissue   Radiographs: Multiple views x-ray of the left foot: Normal-appearing third metatarsal head, distal phalanx of hallux distal tuft does show an area of possible osteolysis Assessment:   1. 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 Contracture of toe joint      Plan:  Patient was evaluated and treated and all questions answered.  We reviewed his radiographs and clinical exam findings.  There are no overt signs of infection.  I do see an area on his x-ray at the distal phalanx that could be possible osteolysis.  I would like to evaluate this further with an MRI for possible infection.  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  However it will be important to know if there is any possible infection prior to proceeding with this.  MRI will be able to evaluate for this.  This will also evaluate the third metatarsal head at his site of previous amputation which appears to be well-healed.  We could consider a third metatarsal head resection to alleviate the plantar callus here.  I will see him back following the MRI also recommended ABIs to follow-up due to his smoking history I discussed with him he would absolutely need to stop smoking prior to an elective surgery such as this.  His previous ABIs and fall of last year were normal.  Return if callus builds up, for after MRI and vascular to review .

## 2022-06-25 DIAGNOSIS — F1120 Opioid dependence, uncomplicated: Secondary | ICD-10-CM | POA: Diagnosis not present

## 2022-06-25 DIAGNOSIS — I10 Essential (primary) hypertension: Secondary | ICD-10-CM | POA: Diagnosis not present

## 2022-06-25 DIAGNOSIS — I739 Peripheral vascular disease, unspecified: Secondary | ICD-10-CM | POA: Diagnosis not present

## 2022-06-25 DIAGNOSIS — L0390 Cellulitis, unspecified: Secondary | ICD-10-CM | POA: Diagnosis not present

## 2022-07-03 ENCOUNTER — Ambulatory Visit: Payer: BC Managed Care – PPO | Admitting: Podiatry

## 2022-07-03 DIAGNOSIS — Z89422 Acquired absence of other left toe(s): Secondary | ICD-10-CM

## 2022-07-03 DIAGNOSIS — L97512 Non-pressure chronic ulcer of other part of right foot with fat layer exposed: Secondary | ICD-10-CM

## 2022-07-03 DIAGNOSIS — M2041 Other hammer toe(s) (acquired), right foot: Secondary | ICD-10-CM

## 2022-07-03 NOTE — Progress Notes (Signed)
Patient presents today to be measured for custom orthotics   Patient was measured for  1 pair of  custom orthotics.  Re-appointment for regularly scheduled diabetic foot care visits or if they should experience any trouble with the shoes or insoles.

## 2022-07-09 DIAGNOSIS — I890 Lymphedema, not elsewhere classified: Secondary | ICD-10-CM | POA: Diagnosis not present

## 2022-07-09 DIAGNOSIS — M1990 Unspecified osteoarthritis, unspecified site: Secondary | ICD-10-CM | POA: Diagnosis not present

## 2022-07-09 DIAGNOSIS — F1120 Opioid dependence, uncomplicated: Secondary | ICD-10-CM | POA: Diagnosis not present

## 2022-07-15 ENCOUNTER — Ambulatory Visit (INDEPENDENT_AMBULATORY_CARE_PROVIDER_SITE_OTHER): Payer: BC Managed Care – PPO | Admitting: Podiatry

## 2022-07-15 DIAGNOSIS — L97512 Non-pressure chronic ulcer of other part of right foot with fat layer exposed: Secondary | ICD-10-CM | POA: Diagnosis not present

## 2022-07-15 DIAGNOSIS — L97522 Non-pressure chronic ulcer of other part of 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: Secondary | ICD-10-CM

## 2022-07-15 NOTE — Progress Notes (Signed)
He presents today for follow-up of his ulceration to his left hallux and left forefoot.  States that it really has not changed she denies fever chills nausea vomit muscle aches and pains.  He states that he is going tomorrow for his vascular studies and he is going on the 29th for the MRI.  Objective: Ulcerative lesion to the hallux is readily relatable does not probe to bone all reactive hyperkeratotic rim was resected today granular tissue was resected to bleeding.  Macerated granulated tissue and skin to the forefoot left probes deep but does not probe to bone.  She does probe to the subcutaneous fat layer.  Assessment: Ulcerations hallux left with dropped hallux left.  Ulceration forefoot left beneath the fourth metatarsal.  Plan: Debrided all reactive hyperkeratotic tissue.  Placed padding and dressing and will follow-up with him after his MRI.

## 2022-07-16 ENCOUNTER — Ambulatory Visit (HOSPITAL_COMMUNITY)
Admission: RE | Admit: 2022-07-16 | Discharge: 2022-07-16 | Disposition: A | Discharge status: Home / Self Care | Payer: BC Managed Care – PPO | Source: Ambulatory Visit | Attending: Podiatry | Admitting: Podiatry

## 2022-07-16 DIAGNOSIS — L97522 Non-pressure chronic ulcer of other part of 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: Secondary | ICD-10-CM | POA: Diagnosis not present

## 2022-07-16 LAB — VAS US PAD ABI
Left ABI: 0.98
Right ABI: 0.88

## 2022-07-20 ENCOUNTER — Ambulatory Visit
Admission: RE | Admit: 2022-07-20 | Discharge: 2022-07-20 | Disposition: A | Discharge status: Home / Self Care | Payer: BC Managed Care – PPO | Source: Ambulatory Visit | Attending: Podiatry | Admitting: Podiatry

## 2022-07-20 DIAGNOSIS — M79672 Pain in left foot: Secondary | ICD-10-CM | POA: Diagnosis not present

## 2022-07-20 DIAGNOSIS — L97529 Non-pressure chronic ulcer of other part of left foot with unspecified severity: Secondary | ICD-10-CM | POA: Diagnosis not present

## 2022-07-20 DIAGNOSIS — Z89422 Acquired absence of other left toe(s): Secondary | ICD-10-CM | POA: Diagnosis not present

## 2022-07-20 DIAGNOSIS — L97522 Non-pressure chronic ulcer of other part of 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: Secondary | ICD-10-CM

## 2022-07-23 DIAGNOSIS — I10 Essential (primary) hypertension: Secondary | ICD-10-CM | POA: Diagnosis not present

## 2022-07-23 DIAGNOSIS — R600 Localized edema: Secondary | ICD-10-CM | POA: Diagnosis not present

## 2022-07-23 DIAGNOSIS — M549 Dorsalgia, unspecified: Secondary | ICD-10-CM | POA: Diagnosis not present

## 2022-07-23 DIAGNOSIS — F1120 Opioid dependence, uncomplicated: Secondary | ICD-10-CM | POA: Diagnosis not present

## 2022-07-26 ENCOUNTER — Other Ambulatory Visit: Payer: BC Managed Care – PPO

## 2022-08-13 DIAGNOSIS — L0390 Cellulitis, unspecified: Secondary | ICD-10-CM | POA: Diagnosis not present

## 2022-08-13 DIAGNOSIS — I10 Essential (primary) hypertension: Secondary | ICD-10-CM | POA: Diagnosis not present

## 2022-08-13 DIAGNOSIS — F1120 Opioid dependence, uncomplicated: Secondary | ICD-10-CM | POA: Diagnosis not present

## 2022-08-13 DIAGNOSIS — I739 Peripheral vascular disease, unspecified: Secondary | ICD-10-CM | POA: Diagnosis not present

## 2022-08-21 ENCOUNTER — Ambulatory Visit: Payer: BC Managed Care – PPO | Admitting: Podiatry

## 2022-08-21 DIAGNOSIS — L97522 Non-pressure chronic ulcer of other part of 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: Secondary | ICD-10-CM

## 2022-08-21 DIAGNOSIS — I739 Peripheral vascular disease, unspecified: Secondary | ICD-10-CM | POA: Diagnosis not present

## 2022-08-21 NOTE — Progress Notes (Signed)
Patient presents today to pick up custom molded foot orthotics.  Orthotics were dispensed and fit was satisfactory. Reviewed instructions for break-in and wear. Written instructions given to patient.  Patient will follow up as needed.   [facility] - order # [id number]

## 2022-08-21 NOTE — Progress Notes (Signed)
Chief Complaint  Patient presents with   Foot Ulcer    Patient came in today for left foot hallux and fore foot ulcer, follow-up, drainage, MRI results and VAS results, Orthotic pick     HPI: 57 y.o. male presenting today for follow-up of ulceration x 2 on the plantar aspect of the left foot.  He is a patient of Dr. Lilian Kapur.  He recently had an MRI which was reviewed today which was negative for osteomyelitis, but could not be ruled out with certainty at the third metatarsal head.  He also had ABIs obtained bilateral.  No past medical history on file.  Past Surgical History:  Procedure Laterality Date   IRRIGATION AND DEBRIDEMENT ABSCESS Left 11/27/2021   Procedure: IRRIGATION AND DEBRIDEMENT foot  3rd ray amputatiion;  Surgeon: Pilar Plate, DPM;  Location: [facility];  Service: Podiatry;  Laterality: Left;   No Known Allergies  Smoker?: former smoker, quit 0 years ago.  Patient states he quit smoking this year as soon as Dr. Lilian Kapur told him he had to.   PHYSICAL EXAM: There were no vitals filed for this visit.  General: The patient is alert and oriented x3 in no acute distress.  Dermatology: Skin is warm, dry and supple bilateral lower extremities. Interspaces are clear of maceration and debris.      Wound 1 description:  Location: Distal/plantar left hallux    Depth: Subcutaneous tissue    Wound Border: Hyperkeratotic    Odor?:  None    Surrounding Tissue: No erythema or edema    Infected?:  No    Necrosis?:  None    Pain?:  No    Tunneling: No    Dimensions (cm): 1.2 cm x 0.6 cm x 0.2 cm   Wound 2 description:  Location: Submet 3 left foot    Depth: To fat tissue    Wound Border: Chronic    Odor?:  No    Surrounding Tissue: No erythema or edema.    Infected?:  No    Necrosis?:  None    Pain?:  None no    Tunneling: Tunnels anteriorly near the plantar third metatarsal head capsule    Dimensions (cm): 2.1 cm x 1.0 cm x 0.5 cm   Vascular: Pedal  pulses are diminished   ABI Findings:  +---------+------------------+-----+-----------+--------+  Right   Rt Pressure (mmHg)IndexWaveform   Comment   +---------+------------------+-----+-----------+--------+  Brachial 137                                         +---------+------------------+-----+-----------+--------+  PTA     121               0.88 multiphasic          +---------+------------------+-----+-----------+--------+  DP      119               0.87 multiphasic          +---------+------------------+-----+-----------+--------+  Great Toe                       Abnormal   CNO       +---------+------------------+-----+-----------+--------+   +---------+------------------+-----+--------+------------------+  Left    Lt Pressure (mmHg)IndexWaveformComment             +---------+------------------+-----+--------+------------------+  Brachial 131                                                +---------+------------------+-----+--------+------------------+  PTA     134               0.98                             +---------+------------------+-----+--------+------------------+  DP      102               0.74                             +---------+------------------+-----+--------+------------------+  Great ToeNot able to obtain     Normal  Wound with bandage  +---------+------------------+-----+--------+------------------+   +-------+-----------+-----------+------------+------------+  ABI/TBIToday's ABIToday's TBIPrevious ABIPrevious TBI  +-------+-----------+-----------+------------+------------+  Right 0.88       [place]         1.09        0.66          +-------+-----------+-----------+------------+------------+  Left  0.98       -          1.26        0.72          +-------+-----------+-----------+------------+------------+   Arterial wall calcification precludes accurate ankle pressures and ABIs.   PPG tracings display appropriate pulsatility.  Bilateral ABIs appear decreased. 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 The right toe-brachial index is abnormal.   Left: Resting left ankle-brachial index is within normal range.   *See table(s) above for measurements and observations.    Electronically signed by Waverly Ferrari MD on 07/16/2022 at 3:38:12  PM.      MRI left foot 07/20/22:  Study Result  Narrative & Impression  CLINICAL DATA:  Left foot pain at the plantar surface of the base of the 3rd phalanx for 2 years. Previous 3rd toe amputation in November 23. Osteomyelitis suspected.   EXAM: MRI OF THE LEFT FOOT WITHOUT CONTRAST   TECHNIQUE: Multiplanar, multisequence MR imaging of the left forefoot was performed. No intravenous contrast was administered.   COMPARISON:  Radiographs 06/24/2022, 11/27/2021 and 11/26/2021. MRI 11/27/2021.   FINDINGS: Bones/Joint/Cartilage   Since the previous MRI, the patient has undergone interval amputation of the 3rd toe at the metatarsophalangeal joint. No evidence of osteomyelitis or acute osseous abnormality within the remaining toes. There are stable moderate degenerative changes at the 1st metatarsophalangeal joint with associated osteophytes. There is new marrow T2 hyperintensity in each of the 2nd, 3rd and 4th metatarsal heads. T1 weighted images demonstrate decreased marrow signal in the 2nd and 4th metatarsal heads with possible mild flattening of the 4th metatarsal head. No definite T1 signal abnormality in the 3rd metatarsal head. The 5th ray appears unremarkable. No significant joint effusions.   Ligaments   Intact Lisfranc ligament. The collateral ligaments of the remaining metatarsophalangeal joints appear intact.   Muscles and Tendons   Mild generalized muscular  atrophy. No evidence of tendon rupture or significant tenosynovitis.   Soft tissues   Apparent plantar skin ulceration distal to the 3rd metatarsal head with extension into the 4th web space (most obvious on the coronal images). No underlying focal fluid collection identified. Nonspecific generalized dorsal subcutaneous edema similar to previous MRI.   IMPRESSION: 1. 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 2. New marrow edema in each of the 2nd, 3rd and 4th metatarsal  heads with decreased T1 signal in the 2nd and 4th metatarsal heads. These findings are nonspecific, especially given the absence significant metatarsal-phalangeal joint effusions or abnormalities within the adjacent bases of the 2nd and 4th proximal phalanges. Osteomyelitis cannot be excluded, although alternative etiologies such as multifocal Freiberg infraction should be considered. 3. Apparent plantar skin ulceration distal to the 3rd metatarsal head with extension into the 4th web space but no associated focal fluid collection. Given the proximity to the 3rd metatarsal head, this could indicate early osteomyelitis in that bone. Correlate clinically. 4. Stable degenerative changes at the 1st metatarsophalangeal joint.     Electronically Signed   By: Carey Bullocks M.D.   On: 07/27/2022 15:25      ASSESSMENT / PLAN OF CARE: 1. 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 PVD (peripheral vascular disease) (HCC)     The ulcerations were sharply debrided of hyperkeratotic and devitalized soft tissue with sterile #312 blade to the level of subcutaneous tissue.  Hemostasis obtained.  Antibiotic ointment and DSD applied.  Reviewed off-loading with patient.  Continue with surgical shoe when weightbearing.   Reviewed daily dressing changes with patient.  Discussed risks / concerns regarding ulcer with patient and possible sequelae if left untreated.  Stressed importance of  infection prevention at home. Short-term goals are:  resolve infection, off-load ulcer, heal ulcer Long-term goals are:  prevent recurrence, prevent amputation.   Return in about 2 weeks (around 09/04/2022) for f/u ulcer w/ Dr. Lilian Kapur to discuss surgery.   Clerance Lav, DPM, FACFAS [facility]     [address] [place], [place] [address]                Office [phone]  Fax [phone]

## 2022-09-04 ENCOUNTER — Other Ambulatory Visit: Payer: Self-pay | Admitting: Podiatry

## 2022-09-04 ENCOUNTER — Ambulatory Visit (INDEPENDENT_AMBULATORY_CARE_PROVIDER_SITE_OTHER): Payer: BC Managed Care – PPO | Admitting: Podiatry

## 2022-09-04 DIAGNOSIS — L97522 Non-pressure chronic ulcer of other part of 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: Secondary | ICD-10-CM

## 2022-09-04 MED ORDER — AMOXICILLIN-POT CLAVULANATE 875-125 MG PO TABS: 1.0000 | ORAL_TABLET | Freq: Two times a day (BID) | ORAL | 0 refills | Status: DC

## 2022-09-04 NOTE — Progress Notes (Signed)
  Subjective:  Patient ID: [name], male    DOB: 1965/01/30,  MRN: [id number]  Chief Complaint  Patient presents with   Wound Check    2 weeks (around 09/04/2022) for f/u ulcer w/ Dr. Lilian Kapur to discuss surgery.per patient has been changing dressing,applying medihoney daily as instructed.    57 y.o. male presents with the above complaint. History confirmed with patient.  He returns for follow-up, was doing quite well but noticed today that the drainage has increased and he is having pain and redness  Objective:  Physical Exam: warm, good capillary refill, normal sensory exam, and edema has improved, he has full-thickness ulceration submetatarsal 3, prior third toe amputation, distal tip hallux ulceration, both probes deep and are tender at the endpoint which is firm.  There is erythema around the wound and serous drainage from the plantar third metatarsal ulcer today  Radiographs: Multiple views x-ray of the left foot: Normal-appearing third metatarsal head, distal phalanx of hallux distal tuft does show an area of possible osteolysis  IMPRESSION: 1. 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 2. New marrow edema in each of the 2nd, 3rd and 4th metatarsal heads with decreased T1 signal in the 2nd and 4th metatarsal heads. These findings are nonspecific, especially given the absence significant metatarsal-phalangeal joint effusions or abnormalities within the adjacent bases of the 2nd and 4th proximal phalanges. Osteomyelitis cannot be excluded, although alternative etiologies such as multifocal Freiberg infraction should be considered. 3. Apparent plantar skin ulceration distal to the 3rd metatarsal head with extension into the 4th web space but no associated focal fluid collection. Given the proximity to the 3rd metatarsal head, this could indicate early osteomyelitis in that bone. Correlate clinically. 4. Stable degenerative changes at the 1st  metatarsophalangeal joint.     Electronically Signed   By: Carey Bullocks M.D.   On: 07/27/2022 15:25  ABI normal on left side Assessment:   1. 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  Appears to have worsened some and has a superficial infection developing.  I took a culture of the drainage.  Rx was sent for Augmentin.  We reviewed the results of his MRI which was inconclusive for osteomyelitis.  Discussed with him I would like to plan for a biopsy of the distal phalanx of the hallux as well as the third metatarsal head, we will reevaluate this further at his next visit, would like him to have lab work completed as well in order for CBC ESR CRP was given to him he will complete this in the next couple of days.  Discussed possibility of worsening infection and he will notify me if any of this develops.  Return in about 12 days (around 09/16/2022) for wound care.

## 2022-09-08 ENCOUNTER — Telehealth: Payer: Self-pay | Admitting: Podiatry

## 2022-09-08 DIAGNOSIS — L97522 Non-pressure chronic ulcer of other part of 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: Secondary | ICD-10-CM | POA: Diagnosis not present

## 2022-09-08 NOTE — Telephone Encounter (Signed)
I was seen in the office on Thursday for wounds on my L foot. Dr. Lilian Kapur prescribed me antibiotics and sent in orders for me to go get blood work done at [facility]. I have not gotten the lab work done yet but over the weekend my foot started to severely swell up. I've been keeping it elevated but if I walk it swells up in no time.

## 2022-09-09 ENCOUNTER — Ambulatory Visit (INDEPENDENT_AMBULATORY_CARE_PROVIDER_SITE_OTHER): Payer: BC Managed Care – PPO | Admitting: Podiatry

## 2022-09-09 ENCOUNTER — Ambulatory Visit (INDEPENDENT_AMBULATORY_CARE_PROVIDER_SITE_OTHER): Payer: BC Managed Care – PPO

## 2022-09-09 DIAGNOSIS — L03116 Cellulitis of left lower limb: Secondary | ICD-10-CM

## 2022-09-09 MED ORDER — GENTAMICIN SULFATE 0.1 % EX OINT: 1.0000 | TOPICAL_OINTMENT | Freq: Every day | CUTANEOUS | 0 refills | Status: DC

## 2022-09-09 MED ORDER — DOXYCYCLINE HYCLATE 100 MG PO TABS: 100.0000 mg | ORAL_TABLET | Freq: Two times a day (BID) | ORAL | 0 refills | Status: DC

## 2022-09-09 MED ORDER — LEVOFLOXACIN 750 MG PO TABS: 750.0000 mg | ORAL_TABLET | Freq: Every day | ORAL | 0 refills | Status: DC

## 2022-09-14 NOTE — Progress Notes (Signed)
  Subjective:  Patient ID: [name], male    DOB: September 03, 1965,  MRN: [id number]  Chief Complaint  Patient presents with   Foot Pain    Foot has not gotten any better still hurts and he thinks that the antibiotics that he is on is not strong enough.    57 y.o. male presents with the above complaint. History confirmed with patient.  He returns for follow-up, notes that he feels like it is looking worse than it was last week.  He completed the lab work.  He is taking the antibiotics I had prescribed.  Objective:  Physical Exam: warm, good capillary refill, normal sensory exam, and edema has improved, he has full-thickness ulceration submetatarsal 3, prior third toe amputation, distal tip hallux ulceration, both probes deep and are tender at the endpoint which is firm.  Continued erythema and serous drainage from plantar third metatarsal ulcer.       Culture with MSSA  Radiographs: Multiple views x-ray of the left foot: Normal-appearing third metatarsal head, distal phalanx of hallux distal tuft does show an area of possible osteolysis  IMPRESSION: 1. 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 2. New marrow edema in each of the 2nd, 3rd and 4th metatarsal heads with decreased T1 signal in the 2nd and 4th metatarsal heads. These findings are nonspecific, especially given the absence significant metatarsal-phalangeal joint effusions or abnormalities within the adjacent bases of the 2nd and 4th proximal phalanges. Osteomyelitis cannot be excluded, although alternative etiologies such as multifocal Freiberg infraction should be considered. 3. Apparent plantar skin ulceration distal to the 3rd metatarsal head with extension into the 4th web space but no associated focal fluid collection. Given the proximity to the 3rd metatarsal head, this could indicate early osteomyelitis in that bone. Correlate clinically. 4. Stable degenerative changes at the 1st  metatarsophalangeal joint.     Electronically Signed   By: Carey Bullocks M.D.   On: 07/27/2022 15:25  ABI normal on left side Assessment:   1. Cellulitis of foot, left      Plan:  Patient was evaluated and treated and all questions answered.  Continues to progress, I broaden his antibiotics to doxycycline and Levaquin, we discussed the possibility of worsening infection, if this continues to spread over the next 2 to 3 days he should proceed to the ER for admission for IV antibiotics and further intervention.  I will see him back in 1 week to reevaluate.  I reviewed his lab work, his ESR is mildly elevated at 31, CRP elevated at 17, his CBC is normal.  No follow-ups on file.

## 2022-09-16 ENCOUNTER — Ambulatory Visit: Payer: BC Managed Care – PPO | Admitting: Podiatry

## 2022-09-17 DIAGNOSIS — I10 Essential (primary) hypertension: Secondary | ICD-10-CM | POA: Diagnosis not present

## 2022-09-17 DIAGNOSIS — M1990 Unspecified osteoarthritis, unspecified site: Secondary | ICD-10-CM | POA: Diagnosis not present

## 2022-09-17 DIAGNOSIS — M549 Dorsalgia, unspecified: Secondary | ICD-10-CM | POA: Diagnosis not present

## 2022-09-17 DIAGNOSIS — F1120 Opioid dependence, uncomplicated: Secondary | ICD-10-CM | POA: Diagnosis not present

## 2022-09-22 ENCOUNTER — Ambulatory Visit (INDEPENDENT_AMBULATORY_CARE_PROVIDER_SITE_OTHER): Payer: BC Managed Care – PPO | Admitting: Podiatry

## 2022-09-22 ENCOUNTER — Encounter: Payer: Self-pay | Admitting: Podiatry

## 2022-09-22 DIAGNOSIS — L97522 Non-pressure chronic ulcer of other part of 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: Secondary | ICD-10-CM

## 2022-09-22 MED ORDER — AMOXICILLIN-POT CLAVULANATE 875-125 MG PO TABS: 1.0000 | ORAL_TABLET | Freq: Two times a day (BID) | ORAL | 0 refills | Status: DC

## 2022-09-23 NOTE — Progress Notes (Signed)
Subjective:  Patient ID: [name], male    DOB: [birth year],  MRN: [id number]  Chief Complaint  Patient presents with   Wound Check    "It's doing okay.  I'd like it to be better.  Some days are better than others.  I'm still dealing with some swelling.  I don't have a lot of discharge.  I think what happened is that I didn't wear my compression hose this weekend."    57 y.o. male presents with the above complaint. History confirmed with patient.  His leg has been swollen because he has not been using his compression stockings over the weekend but overall says that is feeling a bit better  Objective:  Physical Exam: warm, good capillary refill, normal sensory exam, and edema has improved, he has full-thickness ulceration submetatarsal 3 measuring 1.0 x 0.5 x 0.5 cm, prior third toe amputation, distal tip hallux ulceration measuring 0.5-0.5 time 0.2 cm, both probes deep and are tender at the endpoint which is firm.  Cellulitis has resolved, there is still serous drainage from the plantar ulcer       Culture with MSSA  Radiographs: Multiple views x-ray of the left foot: Normal-appearing third metatarsal head, distal phalanx of hallux distal tuft does show an area of possible osteolysis  IMPRESSION: 1. 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 2. New marrow edema in each of the 2nd, 3rd and 4th metatarsal heads with decreased T1 signal in the 2nd and 4th metatarsal heads. These findings are nonspecific, especially given the absence significant metatarsal-phalangeal joint effusions or abnormalities within the adjacent bases of the 2nd and 4th proximal phalanges. Osteomyelitis cannot be excluded, although alternative etiologies such as multifocal Freiberg infraction should be considered. 3. Apparent plantar skin ulceration distal to the 3rd metatarsal head with extension into the 4th web space but no associated focal fluid collection. Given the  proximity to the 3rd metatarsal head, this could indicate early osteomyelitis in that bone. Correlate clinically. 4. Stable degenerative changes at the 1st metatarsophalangeal joint.     Electronically Signed   By: Carey Bullocks M.D.   On: 07/27/2022 15:25  ABI normal on left side Assessment:   1. 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  Has had some improvement on antibiotics.  Ulcerations were debrided of nonviable tissue and hyperkeratosis.  Postdebridement measurements are noted above.  Long-term we discussed more definitive plan with resection of the distal tuft of the distal phalanx of the hallux, resection of the third metatarsal head possible ulcer excision and possible FHL tenotomy.  We discussed what the recovery process for this would entail, currently he is exhausted all his leave options for this year with his job and he would like to wait until early next year to try to do this.  We discussed the risk of continued or spread of infection.  We will keep him on suppressive antibiotics until he is able to proceed with surgery.  Rx for Augmentin was sent to pharmacy for 30 days at a time.  We discussed the risk of antibacterial resistance developing due to this.  He understands these risks and would like to proceed with this plan.  I will see him back in 3 to 4 weeks for ongoing wound care.  Discussed if worsening signs and symptoms of infection to notify me immediately and if any systemic signs of develop  such as fever chills nausea vomiting to proceed to the ER.  Return in about 4 weeks (around 10/20/2022) for wound care.

## 2022-09-24 DIAGNOSIS — F1120 Opioid dependence, uncomplicated: Secondary | ICD-10-CM | POA: Diagnosis not present

## 2022-10-14 DIAGNOSIS — I10 Essential (primary) hypertension: Secondary | ICD-10-CM | POA: Diagnosis not present

## 2022-10-14 DIAGNOSIS — M549 Dorsalgia, unspecified: Secondary | ICD-10-CM | POA: Diagnosis not present

## 2022-10-14 DIAGNOSIS — R600 Localized edema: Secondary | ICD-10-CM | POA: Diagnosis not present

## 2022-10-14 DIAGNOSIS — F1120 Opioid dependence, uncomplicated: Secondary | ICD-10-CM | POA: Diagnosis not present

## 2022-10-15 DIAGNOSIS — F1120 Opioid dependence, uncomplicated: Secondary | ICD-10-CM | POA: Diagnosis not present

## 2022-10-16 ENCOUNTER — Ambulatory Visit (INDEPENDENT_AMBULATORY_CARE_PROVIDER_SITE_OTHER): Payer: BC Managed Care – PPO | Admitting: Podiatry

## 2022-10-16 DIAGNOSIS — L97522 Non-pressure chronic ulcer of other part of 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: Secondary | ICD-10-CM | POA: Diagnosis not present

## 2022-10-19 ENCOUNTER — Encounter: Payer: Self-pay | Admitting: Podiatry

## 2022-10-19 MED ORDER — AMOXICILLIN-POT CLAVULANATE 875-125 MG PO TABS: 1.0000 | ORAL_TABLET | Freq: Two times a day (BID) | ORAL | 0 refills | Status: AC

## 2022-10-19 NOTE — Progress Notes (Signed)
Subjective:  Patient ID: [name], male    DOB: [birth year],  MRN: [id number]  Chief Complaint  Patient presents with   Wound Check    HE THINKS IT IS DOING PRETTY GOOD AND HEALING UP, NO PAIN, DENIES N/V/F/C/SOB, NON DIABETIC,IS HAVING CLEAR DRAINAGE , NO ODOR, IS HAVING SOME SWELLING, STILL TAKING ABX    57 y.o. male presents with the above complaint. History confirmed with patient.  He notes improvement  Objective:  Physical Exam: warm, good capillary refill, normal sensory exam, and edema has improved, he has full-thickness ulceration submetatarsal 3 measuring 1.8 x 1.0 x 0.5 cm, prior third toe amputation, distal tip hallux ulceration measuring 0.3 x 0.3 x 0.2 cm, the plantar foot ulcer probes deep.  Cellulitis has resolved, there is still serous drainage from the plantar ulcer       Culture with MSSA  Radiographs: Multiple views x-ray of the left foot: Normal-appearing third metatarsal head, distal phalanx of hallux distal tuft does show an area of possible osteolysis  IMPRESSION: 1. 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 2. New marrow edema in each of the 2nd, 3rd and 4th metatarsal heads with decreased T1 signal in the 2nd and 4th metatarsal heads. These findings are nonspecific, especially given the absence significant metatarsal-phalangeal joint effusions or abnormalities within the adjacent bases of the 2nd and 4th proximal phalanges. Osteomyelitis cannot be excluded, although alternative etiologies such as multifocal Freiberg infraction should be considered. 3. Apparent plantar skin ulceration distal to the 3rd metatarsal head with extension into the 4th web space but no associated focal fluid collection. Given the proximity to the 3rd metatarsal head, this could indicate early osteomyelitis in that bone. Correlate clinically. 4. Stable degenerative changes at the 1st metatarsophalangeal joint.     Electronically Signed    By: Carey Bullocks M.D.   On: 07/27/2022 15:25  ABI normal on left side Assessment:   1. 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  Continues to have improvement on antibiotics.  Ulcerations were debrided of nonviable tissue and hyperkeratosis to the SQ layer sharply in an excisional manner with a scalcpel.  Postdebridement measurements are noted above.  Continue augmentin for now twice daily.  We discussed the risk of antibacterial resistance developing due to this.  Return 1 month ongoing wound care.  Discussed if worsening signs and symptoms of infection to notify me immediately and if any systemic signs of develop such as fever chills nausea vomiting to proceed to the ER.  Return in about 1 month (around 11/15/2022) for wound care.

## 2022-11-11 DIAGNOSIS — M549 Dorsalgia, unspecified: Secondary | ICD-10-CM | POA: Diagnosis not present

## 2022-11-11 DIAGNOSIS — I10 Essential (primary) hypertension: Secondary | ICD-10-CM | POA: Diagnosis not present

## 2022-11-11 DIAGNOSIS — R600 Localized edema: Secondary | ICD-10-CM | POA: Diagnosis not present

## 2022-11-11 DIAGNOSIS — F1120 Opioid dependence, uncomplicated: Secondary | ICD-10-CM | POA: Diagnosis not present

## 2022-11-13 ENCOUNTER — Encounter: Payer: Self-pay | Admitting: Podiatry

## 2022-11-13 ENCOUNTER — Ambulatory Visit: Payer: BC Managed Care – PPO | Admitting: Podiatry

## 2022-11-13 VITALS — Ht 75.0 in | Wt 235.9 lb

## 2022-11-13 DIAGNOSIS — L97522 Non-pressure chronic ulcer of other part of 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: Secondary | ICD-10-CM

## 2022-11-13 NOTE — Progress Notes (Signed)
  Subjective:  Patient ID: [name], male    DOB: 03-08-1965,  MRN: [id number]  Chief Complaint  Patient presents with   Wound Check    57 y.o. male presents with the above complaint. History confirmed with patient.  He notes improvement  Objective:  Physical Exam: warm, good capillary refill, normal sensory exam, and edema has improved, he has full-thickness ulceration submetatarsal 3 measuring 1.2 x 0.6 x 1.0 cm, prior third toe amputation, distal tip hallux ulceration has healed, the plantar foot ulcer probes deep.  There is no cellulitis there is still serous drainage from the plantar ulcer       Culture with MSSA  Radiographs: Multiple views x-ray of the left foot: Normal-appearing third metatarsal head, distal phalanx of hallux distal tuft does show an area of possible osteolysis  IMPRESSION: 1. 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 2. New marrow edema in each of the 2nd, 3rd and 4th metatarsal heads with decreased T1 signal in the 2nd and 4th metatarsal heads. These findings are nonspecific, especially given the absence significant metatarsal-phalangeal joint effusions or abnormalities within the adjacent bases of the 2nd and 4th proximal phalanges. Osteomyelitis cannot be excluded, although alternative etiologies such as multifocal Freiberg infraction should be considered. 3. Apparent plantar skin ulceration distal to the 3rd metatarsal head with extension into the 4th web space but no associated focal fluid collection. Given the proximity to the 3rd metatarsal head, this could indicate early osteomyelitis in that bone. Correlate clinically. 4. Stable degenerative changes at the 1st metatarsophalangeal joint.     Electronically Signed   By: Carey Bullocks M.D.   On: 07/27/2022 15:25  ABI normal on left side Assessment:   1. 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  Continues to have improvement on antibiotics.  Ulcerations were debrided of nonviable tissue and hyperkeratosis to the SQ layer sharply in an excisional manner with a scalpel.  Postdebridement measurements are noted above.  Continue augmentin for now twice daily.  We discussed the risk of antibacterial resistance developing due to this.  Return 1 month ongoing wound care.  Discussed if worsening signs and symptoms of infection to notify me immediately and if any systemic signs of develop such as fever chills nausea vomiting to proceed to the ER.  Apply gentamicin ointment at home daily with a bandage continue using offloading insert with pad  No follow-ups on file.

## 2022-11-19 DIAGNOSIS — I890 Lymphedema, not elsewhere classified: Secondary | ICD-10-CM | POA: Diagnosis not present

## 2022-11-19 DIAGNOSIS — F1120 Opioid dependence, uncomplicated: Secondary | ICD-10-CM | POA: Diagnosis not present

## 2022-12-09 DIAGNOSIS — I10 Essential (primary) hypertension: Secondary | ICD-10-CM | POA: Diagnosis not present

## 2022-12-09 DIAGNOSIS — F1120 Opioid dependence, uncomplicated: Secondary | ICD-10-CM | POA: Diagnosis not present

## 2022-12-09 DIAGNOSIS — M549 Dorsalgia, unspecified: Secondary | ICD-10-CM | POA: Diagnosis not present

## 2022-12-09 DIAGNOSIS — R600 Localized edema: Secondary | ICD-10-CM | POA: Diagnosis not present

## 2022-12-16 DIAGNOSIS — F1120 Opioid dependence, uncomplicated: Secondary | ICD-10-CM | POA: Diagnosis not present

## 2022-12-16 DIAGNOSIS — I890 Lymphedema, not elsewhere classified: Secondary | ICD-10-CM | POA: Diagnosis not present

## 2022-12-17 DIAGNOSIS — M5442 Lumbago with sciatica, left side: Secondary | ICD-10-CM | POA: Diagnosis not present

## 2022-12-18 ENCOUNTER — Ambulatory Visit (INDEPENDENT_AMBULATORY_CARE_PROVIDER_SITE_OTHER): Payer: BC Managed Care – PPO | Admitting: Podiatry

## 2022-12-18 DIAGNOSIS — L97522 Non-pressure chronic ulcer of other part of 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: Secondary | ICD-10-CM | POA: Diagnosis not present

## 2022-12-18 MED ORDER — AMOXICILLIN-POT CLAVULANATE 875-125 MG PO TABS: 1.0000 | ORAL_TABLET | Freq: Two times a day (BID) | ORAL | 0 refills | Status: DC

## 2022-12-21 ENCOUNTER — Encounter: Payer: Self-pay | Admitting: Podiatry

## 2022-12-21 NOTE — Progress Notes (Signed)
  Subjective:  Patient ID: [name], male    DOB: 1965/10/11,  MRN: [id number]  Chief Complaint  Patient presents with   Wound Check    Patient is here for left foot ulcer F/U    56 y.o. male presents with the above complaint. History confirmed with patient.  He notes improvement  Objective:  Physical Exam: warm, good capillary refill, normal sensory exam, and edema has improved, he has full-thickness ulceration submetatarsal 3 measuring 0.7 x 0.4 x 0.5 cm, prior third toe amputation, distal tip hallux ulceration has healed, the plantar foot ulcer probes to subcutaneous tissue.  There is no cellulitis or active drainage       Culture with MSSA  Radiographs: Multiple views x-ray of the left foot: Normal-appearing third metatarsal head, distal phalanx of hallux distal tuft does show an area of possible osteolysis  IMPRESSION: 1. 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 2. New marrow edema in each of the 2nd, 3rd and 4th metatarsal heads with decreased T1 signal in the 2nd and 4th metatarsal heads. These findings are nonspecific, especially given the absence significant metatarsal-phalangeal joint effusions or abnormalities within the adjacent bases of the 2nd and 4th proximal phalanges. Osteomyelitis cannot be excluded, although alternative etiologies such as multifocal Freiberg infraction should be considered. 3. Apparent plantar skin ulceration distal to the 3rd metatarsal head with extension into the 4th web space but no associated focal fluid collection. Given the proximity to the 3rd metatarsal head, this could indicate early osteomyelitis in that bone. Correlate clinically. 4. Stable degenerative changes at the 1st metatarsophalangeal joint.     Electronically Signed   By: Carey Bullocks M.D.   On: 07/27/2022 15:25  ABI normal on left side Assessment:   1. Ulcer of 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 (HCC)      Plan:  Patient  was evaluated and treated and all questions answered.  Continues to have improvement on antibiotics we will continue his Augmentin and refill was sent.  Ulcerations were debrided of nonviable tissue and hyperkeratosis to the subcutaneous layer sharply in an excisional manner with a scalpel.  Postdebridement measurements are noted above.  Continue augmentin for now twice daily.  We discussed the risk of antibacterial resistance developing due to this.  Return 1 month ongoing wound care.  Discussed if worsening signs and symptoms of infection to notify me immediately and if any systemic signs of develop such as fever chills nausea vomiting to proceed to the ER.  Apply gentamicin ointment at home daily with a bandage continue using offloading insert with pad  Return in about 4 weeks (around 01/15/2023) for wound care.

## 2022-12-30 DIAGNOSIS — M545 Low back pain, unspecified: Secondary | ICD-10-CM | POA: Diagnosis not present

## 2023-01-06 DIAGNOSIS — M549 Dorsalgia, unspecified: Secondary | ICD-10-CM | POA: Diagnosis not present

## 2023-01-06 DIAGNOSIS — I10 Essential (primary) hypertension: Secondary | ICD-10-CM | POA: Diagnosis not present

## 2023-01-06 DIAGNOSIS — R600 Localized edema: Secondary | ICD-10-CM | POA: Diagnosis not present

## 2023-01-06 DIAGNOSIS — F1120 Opioid dependence, uncomplicated: Secondary | ICD-10-CM | POA: Diagnosis not present

## 2023-01-12 DIAGNOSIS — M545 Low back pain, unspecified: Secondary | ICD-10-CM | POA: Diagnosis not present

## 2023-01-13 DIAGNOSIS — F1120 Opioid dependence, uncomplicated: Secondary | ICD-10-CM | POA: Diagnosis not present

## 2023-01-13 DIAGNOSIS — L0390 Cellulitis, unspecified: Secondary | ICD-10-CM | POA: Diagnosis not present

## 2023-01-15 ENCOUNTER — Ambulatory Visit (INDEPENDENT_AMBULATORY_CARE_PROVIDER_SITE_OTHER): Payer: BC Managed Care – PPO | Admitting: Podiatry

## 2023-01-15 ENCOUNTER — Encounter: Payer: Self-pay | Admitting: Podiatry

## 2023-01-15 DIAGNOSIS — L97521 Non-pressure chronic ulcer of other part of left foot limited to breakdown of skin: Secondary | ICD-10-CM | POA: Diagnosis not present

## 2023-01-18 NOTE — Progress Notes (Signed)
  Subjective:  Patient ID: [name], male    DOB: November 28, 1965,  MRN: [id number]  Chief Complaint  Patient presents with   Foot Ulcer    Left foot ulcer follow up, per the patient he feels he is doing well, he has noticed any discharge.     57 y.o. male presents with the above complaint. History confirmed with patient.  He notes improvement  Objective:  Physical Exam: warm, good capillary refill, normal sensory exam, and edema has improved, hallux ulcer has healed plantar forefoot ulcer is now preulcerative and only has partial thickness skin breakdown no signs of infection or exposed subcutaneous tissue       Culture with MSSA  Radiographs: Multiple views x-ray of the left foot: Normal-appearing third metatarsal head, distal phalanx of hallux distal tuft does show an area of possible osteolysis  IMPRESSION: 1. 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 2. New marrow edema in each of the 2nd, 3rd and 4th metatarsal heads with decreased T1 signal in the 2nd and 4th metatarsal heads. These findings are nonspecific, especially given the absence significant metatarsal-phalangeal joint effusions or abnormalities within the adjacent bases of the 2nd and 4th proximal phalanges. Osteomyelitis cannot be excluded, although alternative etiologies such as multifocal Freiberg infraction should be considered. 3. Apparent plantar skin ulceration distal to the 3rd metatarsal head with extension into the 4th web space but no associated focal fluid collection. Given the proximity to the 3rd metatarsal head, this could indicate early osteomyelitis in that bone. Correlate clinically. 4. Stable degenerative changes at the 1st metatarsophalangeal joint.     Electronically Signed   By: Carey Bullocks M.D.   On: 07/27/2022 15:25  ABI normal on left side Assessment:   1. Ulcer of left foot, limited to breakdown of skin [facility])      Plan:  Patient was  evaluated and treated and all questions answered.  Doing very well we will continue antibiotics for now, ulceration has resolved into a preulcerative lesion with limited skin breakdown, I debrided this with a sharp scalpel and there is no further sinus tract or exposed cutaneous tissue.  Does not need bandaging.  Continue antibiotics.  Return in 1 month to reevaluate.  Hopefully can transition away from antibiotics at that point if it is healed completely  Return in about 1 month (around 02/15/2023) for wound f/u .

## 2023-01-26 DIAGNOSIS — M4146 Neuromuscular scoliosis, lumbar region: Secondary | ICD-10-CM | POA: Diagnosis not present

## 2023-01-26 DIAGNOSIS — M545 Low back pain, unspecified: Secondary | ICD-10-CM | POA: Diagnosis not present

## 2023-01-27 DIAGNOSIS — M4146 Neuromuscular scoliosis, lumbar region: Secondary | ICD-10-CM | POA: Diagnosis not present

## 2023-01-27 DIAGNOSIS — M545 Low back pain, unspecified: Secondary | ICD-10-CM | POA: Diagnosis not present

## 2023-02-05 DIAGNOSIS — M545 Low back pain, unspecified: Secondary | ICD-10-CM | POA: Diagnosis not present

## 2023-02-05 DIAGNOSIS — M4146 Neuromuscular scoliosis, lumbar region: Secondary | ICD-10-CM | POA: Diagnosis not present

## 2023-02-09 DIAGNOSIS — M5416 Radiculopathy, lumbar region: Secondary | ICD-10-CM | POA: Diagnosis not present

## 2023-02-10 DIAGNOSIS — F1120 Opioid dependence, uncomplicated: Secondary | ICD-10-CM | POA: Diagnosis not present

## 2023-02-12 ENCOUNTER — Ambulatory Visit: Payer: BC Managed Care – PPO | Admitting: Podiatry

## 2023-02-17 DIAGNOSIS — M545 Low back pain, unspecified: Secondary | ICD-10-CM | POA: Diagnosis not present

## 2023-02-17 DIAGNOSIS — M4146 Neuromuscular scoliosis, lumbar region: Secondary | ICD-10-CM | POA: Diagnosis not present

## 2023-02-19 DIAGNOSIS — M4146 Neuromuscular scoliosis, lumbar region: Secondary | ICD-10-CM | POA: Diagnosis not present

## 2023-02-19 DIAGNOSIS — M545 Low back pain, unspecified: Secondary | ICD-10-CM | POA: Diagnosis not present

## 2023-02-26 DIAGNOSIS — F1120 Opioid dependence, uncomplicated: Secondary | ICD-10-CM | POA: Diagnosis not present

## 2023-03-09 ENCOUNTER — Ambulatory Visit (INDEPENDENT_AMBULATORY_CARE_PROVIDER_SITE_OTHER): Payer: BC Managed Care – PPO | Admitting: Podiatry

## 2023-03-09 ENCOUNTER — Encounter: Payer: Self-pay | Admitting: Podiatry

## 2023-03-09 VITALS — Ht 75.0 in | Wt 235.9 lb

## 2023-03-09 DIAGNOSIS — L84 Corns and callosities: Secondary | ICD-10-CM

## 2023-03-09 DIAGNOSIS — I739 Peripheral vascular disease, unspecified: Secondary | ICD-10-CM

## 2023-03-09 DIAGNOSIS — L97521 Non-pressure chronic ulcer of other part of left foot limited to breakdown of skin: Secondary | ICD-10-CM | POA: Diagnosis not present

## 2023-03-09 NOTE — Progress Notes (Signed)
  Subjective:  Patient ID: [name], male    DOB: 1965-04-17,  MRN: [id number]  Chief Complaint  Patient presents with   Foot Ulcer    Pt is here to f/u on left foot ulcer, pt states his foot feels great.    58 y.o. male presents with the above complaint. History confirmed with patient.  Doing well has not had any drainage  Objective:  Physical Exam: warm, good capillary refill, normal sensory exam, and edema has improved, previous ulcer sites have healed with only preulcerative calluses submetatarsal 3 and great toe    Culture with MSSA  Radiographs: Multiple views x-ray of the left foot: Normal-appearing third metatarsal head, distal phalanx of hallux distal tuft does show an area of possible osteolysis  IMPRESSION: 1. 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 2. New marrow edema in each of the 2nd, 3rd and 4th metatarsal heads with decreased T1 signal in the 2nd and 4th metatarsal heads. These findings are nonspecific, especially given the absence significant metatarsal-phalangeal joint effusions or abnormalities within the adjacent bases of the 2nd and 4th proximal phalanges. Osteomyelitis cannot be excluded, although alternative etiologies such as multifocal Freiberg infraction should be considered. 3. Apparent plantar skin ulceration distal to the 3rd metatarsal head with extension into the 4th web space but no associated focal fluid collection. Given the proximity to the 3rd metatarsal head, this could indicate early osteomyelitis in that bone. Correlate clinically. 4. Stable degenerative changes at the 1st metatarsophalangeal joint.     Electronically Signed   By: Elmon Hagedorn M.D.   On: 07/27/2022 15:25  ABI normal on left side Assessment:   1. Ulcer of left foot, limited to breakdown of skin (HCC)   2. Callus of foot   3. PVD (peripheral vascular disease) (HCC)      Plan:  Patient was evaluated and treated and all  questions answered.  Continues to improve.  He has completed his antibiotics and there has not been recurrence of infection.  Debrided the hyperkeratosis and callus to tolerable level.  Return in 3 months for reevaluation  Return in about 3 months (around 06/06/2023) for callus care - [facility].

## 2023-03-10 DIAGNOSIS — M549 Dorsalgia, unspecified: Secondary | ICD-10-CM | POA: Diagnosis not present

## 2023-03-10 DIAGNOSIS — R600 Localized edema: Secondary | ICD-10-CM | POA: Diagnosis not present

## 2023-03-10 DIAGNOSIS — F1120 Opioid dependence, uncomplicated: Secondary | ICD-10-CM | POA: Diagnosis not present

## 2023-03-10 DIAGNOSIS — I10 Essential (primary) hypertension: Secondary | ICD-10-CM | POA: Diagnosis not present

## 2023-03-16 DIAGNOSIS — F1120 Opioid dependence, uncomplicated: Secondary | ICD-10-CM | POA: Diagnosis not present

## 2023-03-19 ENCOUNTER — Telehealth: Payer: Self-pay | Admitting: Podiatry

## 2023-03-19 MED ORDER — AMOXICILLIN-POT CLAVULANATE 875-125 MG PO TABS: 1.0000 | ORAL_TABLET | Freq: Two times a day (BID) | ORAL | 0 refills | Status: DC

## 2023-03-19 NOTE — Telephone Encounter (Signed)
 Pt would like to know if antibiotics can be prescribed, he is currently having a flare-up on his L foot. There is swelling, sensitivity, and body aches. Pts preferred pharmacy is [facility] on [address].

## 2023-03-23 ENCOUNTER — Encounter (HOSPITAL_COMMUNITY): Payer: Self-pay | Admitting: Emergency Medicine

## 2023-03-23 ENCOUNTER — Inpatient Hospital Stay (HOSPITAL_COMMUNITY): Payer: BC Managed Care – PPO

## 2023-03-23 ENCOUNTER — Other Ambulatory Visit: Payer: Self-pay

## 2023-03-23 ENCOUNTER — Inpatient Hospital Stay (HOSPITAL_COMMUNITY)
Admission: EM | Admit: 2023-03-23 | Discharge: 2023-03-27 | DRG: 617 | Disposition: A | Discharge status: Home / Self Care | Payer: BC Managed Care – PPO | Attending: Internal Medicine | Admitting: Internal Medicine

## 2023-03-23 ENCOUNTER — Telehealth: Payer: Self-pay | Admitting: Podiatry

## 2023-03-23 ENCOUNTER — Emergency Department (HOSPITAL_COMMUNITY): Payer: BC Managed Care – PPO

## 2023-03-23 DIAGNOSIS — I890 Lymphedema, not elsewhere classified: Secondary | ICD-10-CM | POA: Diagnosis present

## 2023-03-23 DIAGNOSIS — F17210 Nicotine dependence, cigarettes, uncomplicated: Secondary | ICD-10-CM | POA: Diagnosis not present

## 2023-03-23 DIAGNOSIS — L02612 Cutaneous abscess of left foot: Secondary | ICD-10-CM | POA: Diagnosis not present

## 2023-03-23 DIAGNOSIS — F17290 Nicotine dependence, other tobacco product, uncomplicated: Secondary | ICD-10-CM | POA: Diagnosis present

## 2023-03-23 DIAGNOSIS — G629 Polyneuropathy, unspecified: Secondary | ICD-10-CM

## 2023-03-23 DIAGNOSIS — F1120 Opioid dependence, uncomplicated: Secondary | ICD-10-CM | POA: Diagnosis present

## 2023-03-23 DIAGNOSIS — G8929 Other chronic pain: Secondary | ICD-10-CM | POA: Diagnosis not present

## 2023-03-23 DIAGNOSIS — L97529 Non-pressure chronic ulcer of other part of left foot with unspecified severity: Secondary | ICD-10-CM | POA: Diagnosis present

## 2023-03-23 DIAGNOSIS — K5900 Constipation, unspecified: Secondary | ICD-10-CM | POA: Diagnosis not present

## 2023-03-23 DIAGNOSIS — B957 Other staphylococcus as the cause of diseases classified elsewhere: Secondary | ICD-10-CM | POA: Diagnosis present

## 2023-03-23 DIAGNOSIS — I10 Essential (primary) hypertension: Secondary | ICD-10-CM | POA: Diagnosis not present

## 2023-03-23 DIAGNOSIS — F1110 Opioid abuse, uncomplicated: Secondary | ICD-10-CM | POA: Diagnosis present

## 2023-03-23 DIAGNOSIS — M86672 Other chronic osteomyelitis, left ankle and foot: Secondary | ICD-10-CM | POA: Diagnosis present

## 2023-03-23 DIAGNOSIS — M85872 Other specified disorders of bone density and structure, left ankle and foot: Secondary | ICD-10-CM | POA: Diagnosis not present

## 2023-03-23 DIAGNOSIS — M869 Osteomyelitis, unspecified: Principal | ICD-10-CM | POA: Diagnosis present

## 2023-03-23 DIAGNOSIS — E1140 Type 2 diabetes mellitus with diabetic neuropathy, unspecified: Secondary | ICD-10-CM | POA: Diagnosis not present

## 2023-03-23 DIAGNOSIS — Z79899 Other long term (current) drug therapy: Secondary | ICD-10-CM | POA: Diagnosis not present

## 2023-03-23 DIAGNOSIS — M86272 Subacute osteomyelitis, left ankle and foot: Secondary | ICD-10-CM | POA: Diagnosis not present

## 2023-03-23 DIAGNOSIS — L03116 Cellulitis of left lower limb: Secondary | ICD-10-CM | POA: Diagnosis not present

## 2023-03-23 DIAGNOSIS — E1169 Type 2 diabetes mellitus with other specified complication: Principal | ICD-10-CM | POA: Diagnosis present

## 2023-03-23 DIAGNOSIS — Z8249 Family history of ischemic heart disease and other diseases of the circulatory system: Secondary | ICD-10-CM | POA: Diagnosis not present

## 2023-03-23 DIAGNOSIS — G4730 Sleep apnea, unspecified: Secondary | ICD-10-CM | POA: Diagnosis not present

## 2023-03-23 DIAGNOSIS — M86172 Other acute osteomyelitis, left ankle and foot: Secondary | ICD-10-CM | POA: Diagnosis not present

## 2023-03-23 DIAGNOSIS — M7989 Other specified soft tissue disorders: Secondary | ICD-10-CM | POA: Diagnosis not present

## 2023-03-23 DIAGNOSIS — Z89422 Acquired absence of other left toe(s): Secondary | ICD-10-CM

## 2023-03-23 HISTORY — DX: Scoliosis, unspecified: M41.9

## 2023-03-23 HISTORY — DX: Sleep apnea, unspecified: G47.30

## 2023-03-23 HISTORY — DX: Osteomyelitis, unspecified: M86.9

## 2023-03-23 HISTORY — DX: Lymphedema, not elsewhere classified: I89.0

## 2023-03-23 LAB — CBC WITH DIFFERENTIAL/PLATELET
Abs Immature Granulocytes: 0.02 10*3/uL (ref 0.00–0.07)
Basophils Absolute: 0 10*3/uL (ref 0.0–0.1)
Basophils Relative: 1 %
Eosinophils Absolute: 0.3 10*3/uL (ref 0.0–0.5)
Eosinophils Relative: 5 %
HCT: 37.7 % — ABNORMAL LOW (ref 39.0–52.0)
Hemoglobin: 12.8 g/dL — ABNORMAL LOW (ref 13.0–17.0)
Immature Granulocytes: 0 %
Lymphocytes Relative: 16 %
Lymphs Abs: 1 10*3/uL (ref 0.7–4.0)
MCH: 30.5 pg (ref 26.0–34.0)
MCHC: 34 g/dL (ref 30.0–36.0)
MCV: 89.8 fL (ref 80.0–100.0)
Monocytes Absolute: 0.6 10*3/uL (ref 0.1–1.0)
Monocytes Relative: 9 %
Neutro Abs: 4.3 10*3/uL (ref 1.7–7.7)
Neutrophils Relative %: 69 %
Platelets: 264 10*3/uL (ref 150–400)
RBC: 4.2 MIL/uL — ABNORMAL LOW (ref 4.22–5.81)
RDW: 12 % (ref 11.5–15.5)
WBC: 6.2 10*3/uL (ref 4.0–10.5)
nRBC: 0 % (ref 0.0–0.2)

## 2023-03-23 LAB — COMPREHENSIVE METABOLIC PANEL
ALT: 24 U/L (ref 0–44)
AST: 24 U/L (ref 15–41)
Albumin: 3.4 g/dL — ABNORMAL LOW (ref 3.5–5.0)
Alkaline Phosphatase: 75 U/L (ref 38–126)
Anion gap: 11 (ref 5–15)
BUN: 18 mg/dL (ref 6–20)
CO2: 21 mmol/L — ABNORMAL LOW (ref 22–32)
Calcium: 9.3 mg/dL (ref 8.9–10.3)
Chloride: 105 mmol/L (ref 98–111)
Creatinine, Ser: 0.86 mg/dL (ref 0.61–1.24)
GFR, Estimated: >60 mL/min (ref >60)
Glucose, Bld: 143 mg/dL — ABNORMAL HIGH (ref 70–99)
Potassium: 3.9 mmol/L (ref 3.5–5.1)
Sodium: 137 mmol/L (ref 135–145)
Total Bilirubin: 0.8 mg/dL (ref 0.0–1.2)
Total Protein: 7 g/dL (ref 6.5–8.1)

## 2023-03-23 LAB — SEDIMENTATION RATE: Sed Rate: 51 mm/h — ABNORMAL HIGH (ref 0–16)

## 2023-03-23 LAB — C-REACTIVE PROTEIN: CRP: 4.5 mg/dL — ABNORMAL HIGH (ref <1.0)

## 2023-03-23 LAB — HIV ANTIBODY (ROUTINE TESTING W REFLEX): HIV Screen 4th Generation wRfx: NONREACTIVE

## 2023-03-23 LAB — I-STAT CG4 LACTIC ACID, ED: Lactic Acid, Venous: 1.2 mmol/L (ref 0.5–1.9)

## 2023-03-23 MED ORDER — BUPRENORPHINE HCL-NALOXONE HCL 8-2 MG SL SUBL
0.5000 | SUBLINGUAL_TABLET | Freq: Three times a day (TID) | SUBLINGUAL | Status: DC
  Administered 2023-03-23 – 2023-03-27 (×12): 0.5 via SUBLINGUAL
  Filled 2023-03-23 (×5): qty 1
  Filled 2023-03-23: qty 0.5
  Filled 2023-03-23 (×6): qty 1

## 2023-03-23 MED ORDER — ALBUTEROL SULFATE (2.5 MG/3ML) 0.083% IN NEBU: 2.5000 mg | INHALATION_SOLUTION | Freq: Four times a day (QID) | RESPIRATORY_TRACT | Status: DC | PRN

## 2023-03-23 MED ORDER — ONDANSETRON HCL 4 MG PO TABS: 4.0000 mg | ORAL_TABLET | Freq: Four times a day (QID) | ORAL | Status: DC | PRN

## 2023-03-23 MED ORDER — SODIUM CHLORIDE 0.9% FLUSH
3.0000 mL | Freq: Two times a day (BID) | INTRAVENOUS | Status: DC
  Administered 2023-03-23 – 2023-03-27 (×7): 3 mL via INTRAVENOUS

## 2023-03-23 MED ORDER — ACETAMINOPHEN 325 MG PO TABS: 650.0000 mg | ORAL_TABLET | Freq: Four times a day (QID) | ORAL | Status: DC | PRN

## 2023-03-23 MED ORDER — GABAPENTIN 100 MG PO CAPS
100.0000 mg | ORAL_CAPSULE | Freq: Every day | ORAL | Status: DC
  Administered 2023-03-23 – 2023-03-26 (×4): 100 mg via ORAL
  Filled 2023-03-23 (×4): qty 1

## 2023-03-23 MED ORDER — HYDRALAZINE HCL 20 MG/ML IJ SOLN: 10.0000 mg | INTRAMUSCULAR | Status: DC | PRN

## 2023-03-23 MED ORDER — NAPROXEN 250 MG PO TABS
500.0000 mg | ORAL_TABLET | Freq: Two times a day (BID) | ORAL | Status: DC | PRN
  Administered 2023-03-24: 500 mg via ORAL
  Filled 2023-03-23: qty 2

## 2023-03-23 MED ORDER — ENOXAPARIN SODIUM 40 MG/0.4ML IJ SOSY
40.0000 mg | PREFILLED_SYRINGE | INTRAMUSCULAR | Status: DC
Start: 2023-03-23 — End: 2023-03-24
  Administered 2023-03-23: 40 mg via SUBCUTANEOUS
  Filled 2023-03-23: qty 0.4

## 2023-03-23 MED ORDER — ONDANSETRON HCL 4 MG/2ML IJ SOLN: 4.0000 mg | Freq: Four times a day (QID) | INTRAMUSCULAR | Status: DC | PRN

## 2023-03-23 MED ORDER — FUROSEMIDE 40 MG PO TABS
40.0000 mg | ORAL_TABLET | Freq: Every morning | ORAL | Status: DC
Start: 2023-03-24 — End: 2023-03-27
  Administered 2023-03-24 – 2023-03-27 (×4): 40 mg via ORAL
  Filled 2023-03-23 (×4): qty 1

## 2023-03-23 MED ORDER — POTASSIUM CHLORIDE CRYS ER 20 MEQ PO TBCR
20.0000 meq | EXTENDED_RELEASE_TABLET | Freq: Every day | ORAL | Status: DC
  Administered 2023-03-23 – 2023-03-27 (×5): 20 meq via ORAL
  Filled 2023-03-23 (×5): qty 1

## 2023-03-23 MED ORDER — ACETAMINOPHEN 650 MG RE SUPP: 650.0000 mg | Freq: Four times a day (QID) | RECTAL | Status: DC | PRN

## 2023-03-23 NOTE — Telephone Encounter (Signed)
 Sounds good thank you. Dr Annamary Rummage, this gentleman had a L SM3 ulcer and osteo, I had recommended met head resection previously but he couldn't do it with his work schedule so we treated with abx and actually healed up but looks like he's had recurrence now

## 2023-03-23 NOTE — Consult Note (Signed)
 PODIATRY CONSULTATION  NAME [name] MRN [id number] DOB [birth year] DOA 03/23/2023   Reason for consult: L foot infection   Attending/Consulting physician: ED  History of present illness: 58 y.o. male w/ history DM 2 with neuropathy and lymphedema with prior L 3rd toe amp presenting for concern for infection in left foot. Infection started Thursday. He was started on abx last week but no improvement and has noted purulent drainage. Directed to come to ED by Dr. Lilian Kapur who was treating him outpatient.        Latest Ref Rng & Units 03/23/2023   11:30 AM 09/08/2022    4:02 PM 11/27/2021    5:41 AM  CBC  WBC 4.0 - 10.5 K/uL 6.2  7.3  11.4   Hemoglobin 13.0 - 17.0 g/dL 78.2  95.6  21.3   Hematocrit 39.0 - 52.0 % 37.7  36.2  33.7   Platelets 150 - 400 K/uL 264  311  258        Latest Ref Rng & Units 03/23/2023   11:30 AM 11/29/2021    2:41 AM 11/27/2021    5:41 AM  BMP  Glucose 70 - 99 mg/dL 086   578   BUN 6 - 20 mg/dL 18   15   Creatinine 4.69 - 1.24 mg/dL 6.29  5.28  4.13   Sodium 135 - 145 mmol/L 137   136   Potassium 3.5 - 5.1 mmol/L 3.9   3.7   Chloride 98 - 111 mmol/L 105   106   CO2 22 - 32 mmol/L 21   24   Calcium 8.9 - 10.3 mg/dL 9.3   8.2       Physical Exam: Lower Extremity Exam Vasc: R - PT palpable, DP palpable. Cap refill < 3 sec to digits  L - PT 1/4 palpable, DP 1/4 palpable. Cap refill <3 sec to digits  Derm: R - Normal temp/texture/turgor with no open lesion or clinical signs of infection    L - Erythema and edema dorsal aspect 3rd met head with raised palpable area of fluid/scar tissue. callus plantar aspect 3rd met head  MSK:  R -  No gross deformities. Compartments soft, non-tender, compressible  L -  prior 3rd toe amputation at MPJ level  Neuro: R - Gross sensation absent. Gross motor function intact   L - Gross sensation absent. Gross motor function intact    ASSESSMENT/PLAN OF CARE 58 y.o. 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  MRI L foot: p XR L foot: erosions of 3rd met head WBC  6.2  - NPO past MN for OR tomorrow AM, plan for L foot I&D with 3rd met head resection, possible abx beads possible wound vac, discussed with pt and he agrees - Continue IV abx broad spectrum pending further culture data - Anticoagulation: HOLD prior to OR - Wound care: None needed pre op - WB status: WBAT in post op shoe - Will continue to follow   Thank you for the consult.  Please contact me directly with any questions or concerns.           Corinna Gab, DPM [facility] / [facility]    [address]  [place], [place] [address]                Office [phone]  Fax [phone]

## 2023-03-23 NOTE — H&P (Signed)
 History and Physical    [name] WJX:[id number] DOB: 1965/02/01 DOA: 03/23/2023 DOS: the patient was seen and examined on 03/23/2023 PCP: [name], MD  Patient coming from: Home  Chief Complaint: Foot infection HPI: [name] is a 58 y.o. male with medical history significant of hypertension, ulcer left foot, osteomyelitis s/p left third toe resection in 2023, peripheral neuropathy, chronic pain who presents for presents with a wound on the top of his foot. He was referred by Dr. Lilian Kapur for evaluation of a foot infection.  He noted swelling and redness develop about 4 days ago, with a noticeable bump appearing.  He had notified his podiatrist of the symptoms and was started on Augmentin at that time. Initially, he experienced chills and possibly a slight fever, but these symptoms subsided after starting antibiotics.  Denies having any  shortness of breath or cough. Despite attempts to manage the wound at home and it progressively worsened and started draining.  He has a history of lymphedema in the right leg, which he believes contributes to the swelling and redness of the foot. He has been trying to manage the condition by keeping off the foot, elevating it, and applying ice.  He has a history of vaping, and does not consume alcohol.  On admission to the emergency department patient was noted to be afebrile with blood pressures elevated up to 151/71, and all other labs relatively within normal limits.  Labs significant for hemoglobin 12.8 and glucose 143. X-rays of the foot noted destruction of the portion of the head at the third metatarsal with a small bone fragment in this region for which findings equivocal for osteomyelitis MRI of the left foot was ordered.  [facility].  It was recommended to hold antibiotics and keeping patient n.p.o. after midnight for need of surgical intervention in AM.  Review of Systems: As mentioned in the  history of present illness. All other systems reviewed and are negative.   No past medical history on file. Past Surgical History:  Procedure Laterality Date   IRRIGATION AND DEBRIDEMENT ABSCESS Left 11/27/2021   Procedure: IRRIGATION AND DEBRIDEMENT foot  3rd ray amputatiion;  Surgeon: Pilar Plate, DPM;  Location: [facility];  Service: Podiatry;  Laterality: Left;   Social History:  reports that he has been smoking cigarettes and e-cigarettes. He has never used smokeless tobacco. He reports that he does not drink alcohol. No history on file for drug use.  No Known Allergies  No family history on file.  Prior to Admission medications   Medication Sig Start Date End Date Taking? Authorizing Provider  amoxicillin-clavulanate (AUGMENTIN) 875-125 MG tablet Take 1 tablet by mouth 2 (two) times daily. 03/19/23   Edwin Cap, DPM  Buprenorphine HCl-Naloxone HCl 8-2 MG FILM Place 0.5 Film under the tongue in the morning, at noon, in the evening, and at bedtime. 10/30/21   [provider]  cloNIDine (CATAPRES) 0.1 MG tablet Take 0.1 mg by mouth daily as needed (fatigue). 05/11/19   [provider]  furosemide (LASIX) 40 MG tablet Take 40 mg by mouth every morning. 10/26/21   [provider]  gabapentin (NEURONTIN) 100 MG capsule Take 100 mg by mouth at bedtime. 01/09/21   [provider]  gentamicin ointment (GARAMYCIN) 0.1 % Apply 1 Application topically daily. 09/09/22   McDonald, Adam R, DPM  KLOR-CON M20 20 MEQ tablet Take 20 mEq by mouth daily. 03/07/19   [provider]  Physical Exam: Vitals:   03/23/23 1107 03/23/23 1121 03/23/23 1409  BP: (!) 151/71  128/80  Pulse: 85  78  Resp: 16  16  Temp: 98.4 F (36.9 C)    SpO2: 99%  97%  Weight:  106 kg     Constitutional: Middle-age male currently no acute distress Eyes: PERRL, lids and conjunctivae normal ENMT: Mucous membranes are moist.  Normal dentition.  Neck: normal, supple, no  masses, no thyromegaly Respiratory: clear to auscultation bilaterally, no wheezing, no crackles. Normal respiratory effort. No accessory muscle use.  Cardiovascular: Regular rate and rhythm, no murmurs / rubs / gallops.  2+ pitting lower extremity edema.  2+ pedal pulses.  Abdomen: no tenderness, no masses palpated. No hepatosplenomegaly. Bowel sounds positive.  Musculoskeletal: no clubbing / cyanosis. No joint deformity upper and lower extremities. Good ROM, no contractures. Normal muscle tone.  Skin:  Ulceration with serosanguinous drainage and surrounding erythema   Neurologic: CN 2-12 grossly intact.  Strength 5/5 in all 4.  Psychiatric: Normal judgment and insight. Alert and oriented x 3. Normal mood.   Data Reviewed:    Assessment and Plan:  Cellulitis of the left foot Osteomyelitis of the left foot Acute.  Patient presents with redness, swelling , and wound of the dorsal aspect of the left foot near previous amputation site for osteomyelitis back in 2023.  He had been placed on Augmentin x-ray imaging of the left foot significant for distraction of the portion of the head of the third metatarsal with bone fragment present in this region concerning for osteomyelitis. -Admit to MedSurg bed -Check blood culture -Check ESR and CRP -Hold antibiotics at this time -N.p.o. after midnight for surgical procedure in a.m. -Appreciate podiatry consultative services, will follow-up for any further recommendations  Essential hypertension Blood pressures currently maintained. -Continue to monitor  Lymphedema Chronic.  Patient reports having lymphedema of the right lower extremity. -Continue furosemide  Peripheral neuropathy -Continue gabapentin  History of opioid abuse -Continue Suboxone  DVT prophylaxis: Lovenox Advance Care Planning:   Code Status: Full Code    Consults: Podiatry  Family Communication: No request to be updated at this time.  Patient does note that if wife or son  call sister okay to update them.  Severity of Illness: The appropriate patient status for this patient is INPATIENT. Inpatient status is judged to be reasonable and necessary in order to provide the required intensity of service to ensure the patient's safety. The patient's presenting symptoms, physical exam findings, and initial radiographic and laboratory data in the context of their chronic comorbidities is felt to place them at high risk for further clinical deterioration. Furthermore, it is not anticipated that the patient will be medically stable for discharge from the hospital within 2 midnights of admission.   * I certify that at the point of admission it is my clinical judgment that the patient will require inpatient hospital care spanning beyond 2 midnights from the point of admission due to high intensity of service, high risk for further deterioration and high frequency of surveillance required.*  Author: Clydie Braun, MD 03/23/2023 2:32 PM  For on call review [email or web address].

## 2023-03-23 NOTE — ED Triage Notes (Signed)
 Presents from PCP for L foot infection to dorsum of foot, started Thursday.  Prescribed amoxicillin by podiatry on Thursday. Initially improving until yesterday when redness and swelling began which the progressed to purulent drainage.   Arrives in surgical shoe

## 2023-03-23 NOTE — Telephone Encounter (Signed)
 Pt called and was given antibiotics for an infection last week and it was looking better up until last night and it took a turn for the worse. He wanted to let you know he was going to the emergency room. He is going to go to [facility] and they will transfer pt to one of the other hospitals if needed. I did tell pt to make sure if admitted to make sure they consult one of our providers.

## 2023-03-23 NOTE — ED Notes (Signed)
 Informed consent signed by patient and witnessed by this RN. Consent at bedside.

## 2023-03-23 NOTE — ED Provider Notes (Signed)
 [place] EMERGENCY DEPARTMENT AT [facility] Provider Note   CSN: [id number] Arrival date & time: 03/23/23  1103     History  No chief complaint on file.   [name] is a 58 y.o. male.  58 yo M with a chief complaints of foot infection.  Patient has had trouble with this off and on.  He had seen his podiatrist about 3 weeks ago and was started on amoxicillin.  He said things were doing well until the past 24 to 48 hours.  Had significant worsening edema and drainage.  No fevers or chills no nausea or vomiting.  He called his podiatrist today and they had encouraged him to come here to be admitted for surgery tomorrow.        Home Medications Prior to Admission medications   Medication Sig Start Date End Date Taking? Authorizing Provider  amoxicillin-clavulanate (AUGMENTIN) 875-125 MG tablet Take 1 tablet by mouth 2 (two) times daily. 03/19/23  Yes McDonald, Rachelle Hora, DPM  Buprenorphine HCl-Naloxone HCl 8-2 MG FILM Place 0.5 Film under the tongue in the morning, at noon, in the evening, and at bedtime. 10/30/21  Yes [provider]  cloNIDine (CATAPRES) 0.1 MG tablet Take 0.1 mg by mouth daily. 05/11/19  Yes [provider]  furosemide (LASIX) 40 MG tablet Take 40 mg by mouth every morning. 10/26/21  Yes [provider]  gabapentin (NEURONTIN) 100 MG capsule Take 100 mg by mouth at bedtime. 01/09/21  Yes [provider]  KLOR-CON M20 20 MEQ tablet Take 20 mEq by mouth daily. 03/07/19  Yes [provider]  naproxen (NAPROSYN) 500 MG tablet Take 500 mg by mouth every 12 (twelve) hours as needed for moderate pain (pain score 4-6). 03/13/23  Yes [provider]      Allergies    Patient has no known allergies.    Review of Systems   Review of Systems  Physical Exam Updated Vital Signs BP 128/80 (BP Location: Right Arm)   Pulse 78   Temp 98.1 F (36.7 C) (Oral)   Resp 16   Wt 106 kg   SpO2 97%   BMI 29.21  kg/m  Physical Exam Vitals and nursing note reviewed.  Constitutional:      Appearance: He is well-developed.  HENT:     Head: Normocephalic and atraumatic.  Eyes:     Pupils: Pupils are equal, round, and reactive to light.  Neck:     Vascular: No JVD.  Cardiovascular:     Rate and Rhythm: Normal rate and regular rhythm.     Heart sounds: No murmur heard.    No friction rub. No gallop.  Pulmonary:     Effort: No respiratory distress.     Breath sounds: No wheezing.  Abdominal:     General: There is no distension.     Tenderness: There is no abdominal tenderness. There is no guarding or rebound.  Musculoskeletal:        General: Swelling and tenderness present. Normal range of motion.     Cervical back: Normal range of motion and neck supple.     Comments: Pain and swelling to the dorsum of the left foot.  There is some fluctuance.  No obvious drainage.  Previous amputation to the left third digit.  Foot is warm and appears well-perfused.  Faint dorsalis pedis pulse.  Skin:    Coloration: Skin is not pale.     Findings: No rash.  Neurological:  Mental Status: He is alert and oriented to person, place, and time.  Psychiatric:        Behavior: Behavior normal.              ED Results / Procedures / Treatments   Labs (all labs ordered are listed, but only abnormal results are displayed) Labs Reviewed  COMPREHENSIVE METABOLIC PANEL - Abnormal; Notable for the following components:      Result Value   CO2 21 (*)    Glucose, Bld 143 (*)    Albumin 3.4 (*)    All other components within normal limits  CBC WITH DIFFERENTIAL/PLATELET - Abnormal; Notable for the following components:   RBC 4.20 (*)    Hemoglobin 12.8 (*)    HCT 37.7 (*)    All other components within normal limits  CULTURE, BLOOD (SINGLE)  HIV ANTIBODY (ROUTINE TESTING W REFLEX)  SEDIMENTATION RATE  C-REACTIVE PROTEIN  I-STAT CG4 LACTIC ACID, ED    EKG None  Radiology DG Foot Complete  Left Result Date: 03/23/2023 CLINICAL DATA:  14782 Infection 95621 EXAM: LEFT FOOT - COMPLETE 3+ VIEW COMPARISON:  09/26/2022. FINDINGS: Redemonstration of amputation of third toe at the metatarsophalangeal joint. Since the prior study, there is destruction of the portion of the head of the third metatarsal. There is a small bone fragment in this region. Findings are equivocal for osteomyelitis. Correlate clinically and if needed, consider MRI exam. Otherwise, no acute fracture or dislocation. No aggressive osseous lesion. Mild multilevel degenerative changes of imaged joints. No focal soft tissue swelling. No focal soft tissue defect or air within the soft tissue. No radiopaque foreign bodies. IMPRESSION: *Redemonstration of third toe amputation. *There is destruction of the portion of the head of the third metatarsal. There is a small bone fragment in this region. Findings are equivocal for osteomyelitis. Correlate clinically and if needed, consider MRI exam. Electronically Signed   By: Jules Schick M.D.   On: 03/23/2023 13:51    Procedures Procedures    Medications Ordered in ED Medications  enoxaparin (LOVENOX) injection 40 mg (40 mg Subcutaneous Given 03/23/23 1515)  sodium chloride flush (NS) 0.9 % injection 3 mL (3 mLs Intravenous Given 03/23/23 1515)  acetaminophen (TYLENOL) tablet 650 mg (has no administration in time range)    Or  acetaminophen (TYLENOL) suppository 650 mg (has no administration in time range)  ondansetron (ZOFRAN) tablet 4 mg (has no administration in time range)    Or  ondansetron (ZOFRAN) injection 4 mg (has no administration in time range)  albuterol (PROVENTIL) (2.5 MG/3ML) 0.083% nebulizer solution 2.5 mg (has no administration in time range)  hydrALAZINE (APRESOLINE) injection 10 mg (has no administration in time range)    ED Course/ Medical Decision Making/ A&P                                 Medical Decision Making Amount and/or Complexity of Data  Reviewed Labs: ordered. Radiology: ordered.  Risk Decision regarding hospitalization.   58 yo M with a chief complaints of foot infection.  The patient has been seeing podiatry for this.  Has had prior amputations in the past.  Comes in with acute worsening while on antibiotics.  Interestingly on my review the patient's chart there was a phone note saying that he may need to be admitted and if so podiatry would be consulted however the patient is already it seems seen the podiatrist then had  consent filled out for surgery tomorrow.  I did discuss with the podiatrist on-call Dr. Annamary Rummage.  He did confirm that the patient is to go to the OR tomorrow and is requesting medical admission overnight.  Recommended holding off on antibiotics until postop.  Lactates normal, no leukocytosis, no acute anemia, no significant electrolyte abnormalities.  Afebrile here.  Discussed with medicine.  The patients results and plan were reviewed and discussed.   Any x-rays performed were independently reviewed by myself.   Differential diagnosis were considered with the presenting HPI.  Medications  enoxaparin (LOVENOX) injection 40 mg (40 mg Subcutaneous Given 03/23/23 1515)  sodium chloride flush (NS) 0.9 % injection 3 mL (3 mLs Intravenous Given 03/23/23 1515)  acetaminophen (TYLENOL) tablet 650 mg (has no administration in time range)    Or  acetaminophen (TYLENOL) suppository 650 mg (has no administration in time range)  ondansetron (ZOFRAN) tablet 4 mg (has no administration in time range)    Or  ondansetron (ZOFRAN) injection 4 mg (has no administration in time range)  albuterol (PROVENTIL) (2.5 MG/3ML) 0.083% nebulizer solution 2.5 mg (has no administration in time range)  hydrALAZINE (APRESOLINE) injection 10 mg (has no administration in time range)    Vitals:   03/23/23 1107 03/23/23 1121 03/23/23 1409 03/23/23 1517  BP: (!) 151/71  128/80   Pulse: 85  78   Resp: 16  16   Temp: 98.4 F (36.9  C)   98.1 F (36.7 C)  TempSrc:    Oral  SpO2: 99%  97%   Weight:  106 kg      Final diagnoses:  Osteomyelitis of left foot, unspecified type (HCC)    Admission/ observation were discussed with the admitting physician, patient and/or family and they are comfortable with the plan.          Final Clinical Impression(s) / ED Diagnoses Final diagnoses:  Osteomyelitis of left foot, unspecified type [facility])    Rx / DC Orders ED Discharge Orders     None         Melene Plan, DO 03/23/23 1525

## 2023-03-24 ENCOUNTER — Inpatient Hospital Stay (HOSPITAL_COMMUNITY): Payer: BC Managed Care – PPO | Admitting: Anesthesiology

## 2023-03-24 ENCOUNTER — Encounter (HOSPITAL_COMMUNITY)
Admission: EM | Disposition: A | Discharge status: Home / Self Care | Payer: Self-pay | Source: Home / Self Care | Attending: Internal Medicine

## 2023-03-24 ENCOUNTER — Encounter (HOSPITAL_COMMUNITY): Payer: Self-pay | Admitting: Internal Medicine

## 2023-03-24 ENCOUNTER — Inpatient Hospital Stay (HOSPITAL_COMMUNITY): Payer: BC Managed Care – PPO

## 2023-03-24 DIAGNOSIS — M869 Osteomyelitis, unspecified: Secondary | ICD-10-CM | POA: Diagnosis not present

## 2023-03-24 DIAGNOSIS — M86272 Subacute osteomyelitis, left ankle and foot: Secondary | ICD-10-CM

## 2023-03-24 HISTORY — PX: METATARSAL HEAD EXCISION: SHX5027

## 2023-03-24 LAB — CBC
HCT: 35.5 % — ABNORMAL LOW (ref 39.0–52.0)
Hemoglobin: 12.2 g/dL — ABNORMAL LOW (ref 13.0–17.0)
MCH: 30.7 pg (ref 26.0–34.0)
MCHC: 34.4 g/dL (ref 30.0–36.0)
MCV: 89.2 fL (ref 80.0–100.0)
Platelets: 256 10*3/uL (ref 150–400)
RBC: 3.98 MIL/uL — ABNORMAL LOW (ref 4.22–5.81)
RDW: 11.9 % (ref 11.5–15.5)
WBC: 6.4 10*3/uL (ref 4.0–10.5)
nRBC: 0 % (ref 0.0–0.2)

## 2023-03-24 LAB — BASIC METABOLIC PANEL
Anion gap: 6 (ref 5–15)
BUN: 15 mg/dL (ref 6–20)
CO2: 24 mmol/L (ref 22–32)
Calcium: 8.7 mg/dL — ABNORMAL LOW (ref 8.9–10.3)
Chloride: 106 mmol/L (ref 98–111)
Creatinine, Ser: 0.9 mg/dL (ref 0.61–1.24)
GFR, Estimated: >60 mL/min (ref >60)
Glucose, Bld: 108 mg/dL — ABNORMAL HIGH (ref 70–99)
Potassium: 4.1 mmol/L (ref 3.5–5.1)
Sodium: 136 mmol/L (ref 135–145)

## 2023-03-24 SURGERY — EXCISION, METATARSAL BONE, HEAD
Anesthesia: Monitor Anesthesia Care | Site: Toe | Laterality: Left

## 2023-03-24 MED ORDER — FENTANYL CITRATE (PF) 100 MCG/2ML IJ SOLN: 25.0000 ug | INTRAMUSCULAR | Status: DC | PRN

## 2023-03-24 MED ORDER — ACETAMINOPHEN 10 MG/ML IV SOLN: 1000.0000 mg | Freq: Once | INTRAVENOUS | Status: DC | PRN

## 2023-03-24 MED ORDER — MIDAZOLAM HCL 2 MG/2ML IJ SOLN
INTRAMUSCULAR | Status: AC
  Filled 2023-03-24: qty 2

## 2023-03-24 MED ORDER — CEFAZOLIN SODIUM-DEXTROSE 2-4 GM/100ML-% IV SOLN
INTRAVENOUS | Status: AC
Start: 2023-03-24 — End: 2023-03-24
  Filled 2023-03-24: qty 100

## 2023-03-24 MED ORDER — NICOTINE POLACRILEX 2 MG MT GUM
2.0000 mg | CHEWING_GUM | OROMUCOSAL | Status: DC | PRN
  Administered 2023-03-25: 2 mg via ORAL
  Filled 2023-03-24 (×2): qty 1

## 2023-03-24 MED ORDER — LIDOCAINE HCL (PF) 1 % IJ SOLN
INTRAMUSCULAR | Status: DC | PRN
  Administered 2023-03-24: 10 mL

## 2023-03-24 MED ORDER — CHLORHEXIDINE GLUCONATE 0.12 % MT SOLN
OROMUCOSAL | Status: AC
Start: 2023-03-24 — End: 2023-03-24
  Administered 2023-03-24: 15 mL via OROMUCOSAL
  Filled 2023-03-24: qty 15

## 2023-03-24 MED ORDER — ACETAMINOPHEN 10 MG/ML IV SOLN
INTRAVENOUS | Status: AC
  Filled 2023-03-24: qty 100

## 2023-03-24 MED ORDER — TOBRAMYCIN SULFATE 80 MG/2ML IJ SOLN
INTRAMUSCULAR | Status: AC
  Filled 2023-03-24: qty 4

## 2023-03-24 MED ORDER — ACETAMINOPHEN 325 MG PO TABS
650.0000 mg | ORAL_TABLET | Freq: Four times a day (QID) | ORAL | Status: DC | PRN
  Administered 2023-03-26 – 2023-03-27 (×2): 650 mg via ORAL
  Filled 2023-03-24 (×2): qty 2

## 2023-03-24 MED ORDER — TOBRAMYCIN SULFATE 80 MG/2ML IJ SOLN
INTRAMUSCULAR | Status: DC | PRN
  Administered 2023-03-24: 160 mg

## 2023-03-24 MED ORDER — BUPIVACAINE HCL (PF) 0.5 % IJ SOLN
INTRAMUSCULAR | Status: DC | PRN
  Administered 2023-03-24: 10 mL

## 2023-03-24 MED ORDER — MIDAZOLAM HCL 2 MG/2ML IJ SOLN
INTRAMUSCULAR | Status: DC | PRN
  Administered 2023-03-24: 2 mg via INTRAVENOUS

## 2023-03-24 MED ORDER — SODIUM CHLORIDE 0.9 % IR SOLN
Status: DC | PRN
  Administered 2023-03-24: 3000 mL

## 2023-03-24 MED ORDER — 0.9 % SODIUM CHLORIDE (POUR BTL) OPTIME
TOPICAL | Status: DC | PRN
  Administered 2023-03-24: 1000 mL

## 2023-03-24 MED ORDER — OXYCODONE-ACETAMINOPHEN 5-325 MG PO TABS
1.0000 | ORAL_TABLET | ORAL | Status: DC | PRN
  Administered 2023-03-24 – 2023-03-26 (×6): 1 via ORAL
  Filled 2023-03-24 (×6): qty 1

## 2023-03-24 MED ORDER — PROPOFOL 500 MG/50ML IV EMUL
INTRAVENOUS | Status: DC | PRN
  Administered 2023-03-24: 120 ug/kg/min via INTRAVENOUS

## 2023-03-24 MED ORDER — VANCOMYCIN HCL 1500 MG/300ML IV SOLN
1500.0000 mg | Freq: Once | INTRAVENOUS | Status: AC
  Administered 2023-03-24: 1500 mg via INTRAVENOUS
  Filled 2023-03-24: qty 300

## 2023-03-24 MED ORDER — ACETAMINOPHEN 10 MG/ML IV SOLN
INTRAVENOUS | Status: DC | PRN
  Administered 2023-03-24: 1000 mg via INTRAVENOUS

## 2023-03-24 MED ORDER — LACTATED RINGERS IV SOLN: INTRAVENOUS | Status: DC

## 2023-03-24 MED ORDER — VANCOMYCIN HCL 500 MG IV SOLR
INTRAVENOUS | Status: AC
  Filled 2023-03-24: qty 10

## 2023-03-24 MED ORDER — VANCOMYCIN HCL 500 MG IV SOLR
INTRAVENOUS | Status: DC | PRN
  Administered 2023-03-24: 500 mg via TOPICAL

## 2023-03-24 MED ORDER — ORAL CARE MOUTH RINSE: 15.0000 mL | Freq: Once | OROMUCOSAL | Status: AC

## 2023-03-24 MED ORDER — LIDOCAINE HCL 1 % IJ SOLN
INTRAMUSCULAR | Status: AC
  Filled 2023-03-24: qty 20

## 2023-03-24 MED ORDER — CHLORHEXIDINE GLUCONATE 0.12 % MT SOLN: 15.0000 mL | Freq: Once | OROMUCOSAL | Status: AC

## 2023-03-24 MED ORDER — DEXMEDETOMIDINE HCL IN NACL 80 MCG/20ML IV SOLN
INTRAVENOUS | Status: DC | PRN
  Administered 2023-03-24: 8 ug via INTRAVENOUS

## 2023-03-24 MED ORDER — BUPIVACAINE HCL (PF) 0.5 % IJ SOLN
INTRAMUSCULAR | Status: AC
  Filled 2023-03-24: qty 20

## 2023-03-24 MED ORDER — FENTANYL CITRATE (PF) 250 MCG/5ML IJ SOLN
INTRAMUSCULAR | Status: AC
  Filled 2023-03-24: qty 5

## 2023-03-24 MED ORDER — ONDANSETRON HCL 4 MG/2ML IJ SOLN
INTRAMUSCULAR | Status: DC | PRN
  Administered 2023-03-24: 4 mg via INTRAVENOUS

## 2023-03-24 SURGICAL SUPPLY — 39 items
BLADE OSC/SAGITTAL MD 5.5X18 (BLADE) ×2 IMPLANT
BLADE SURG 10 STRL SS (BLADE) ×2 IMPLANT
BLADE SURG 15 STRL LF DISP TIS (BLADE) ×4 IMPLANT
BNDG COHESIVE 3X5 TAN ST LF (GAUZE/BANDAGES/DRESSINGS) ×2 IMPLANT
BNDG ELASTIC 3INX 5YD STR LF (GAUZE/BANDAGES/DRESSINGS) ×2 IMPLANT
BNDG ELASTIC 4INX 5YD STR LF (GAUZE/BANDAGES/DRESSINGS) IMPLANT
BNDG ELASTIC 4X5.8 VLCR NS LF (GAUZE/BANDAGES/DRESSINGS) IMPLANT
BNDG ESMARK 4X9 LF (GAUZE/BANDAGES/DRESSINGS) ×2 IMPLANT
BNDG GAUZE DERMACEA FLUFF 4 (GAUZE/BANDAGES/DRESSINGS) IMPLANT
CHLORAPREP W/TINT 26 (MISCELLANEOUS) ×2 IMPLANT
DRSG ADAPTIC 3X8 NADH LF (GAUZE/BANDAGES/DRESSINGS) IMPLANT
ELECT REM PT RETURN 9FT ADLT (ELECTROSURGICAL) ×1 IMPLANT
ELECTRODE REM PT RTRN 9FT ADLT (ELECTROSURGICAL) ×2 IMPLANT
GAUZE PACKING IODOFORM 1/2INX (GAUZE/BANDAGES/DRESSINGS) IMPLANT
GAUZE PAD ABD 8X10 STRL (GAUZE/BANDAGES/DRESSINGS) IMPLANT
GAUZE SPONGE 2X2 STRL 8-PLY (GAUZE/BANDAGES/DRESSINGS) ×4 IMPLANT
GAUZE SPONGE 4X4 12PLY STRL (GAUZE/BANDAGES/DRESSINGS) ×2 IMPLANT
GAUZE STRETCH 2X75IN STRL (MISCELLANEOUS) ×2 IMPLANT
GAUZE XEROFORM 1X8 LF (GAUZE/BANDAGES/DRESSINGS) ×2 IMPLANT
GLOVE BIO SURGEON STRL SZ7.5 (GLOVE) ×2 IMPLANT
GLOVE BIOGEL PI IND STRL 7.5 (GLOVE) ×2 IMPLANT
GOWN STRL REUS W/ TWL LRG LVL3 (GOWN DISPOSABLE) ×4 IMPLANT
KIT BASIN OR (CUSTOM PROCEDURE TRAY) ×2 IMPLANT
KIT STIMULAN RAPID CURE 5CC (Orthopedic Implant) IMPLANT
NDL BIOPSY JAMSHIDI 8X6 (NEEDLE) IMPLANT
NDL HYPO 25X1 1.5 SAFETY (NEEDLE) ×4 IMPLANT
NEEDLE BIOPSY JAMSHIDI 8X6 (NEEDLE) IMPLANT
NEEDLE HYPO 25X1 1.5 SAFETY (NEEDLE) ×2 IMPLANT
PACK ORTHO EXTREMITY (CUSTOM PROCEDURE TRAY) ×2 IMPLANT
PADDING CAST ABS COTTON 4X4 ST (CAST SUPPLIES) ×4 IMPLANT
SPIKE FLUID TRANSFER (MISCELLANEOUS) ×4 IMPLANT
STAPLER VISISTAT 35W (STAPLE) IMPLANT
STOCKINETTE 4X48 STRL (DRAPES) ×2 IMPLANT
SUT ETHILON 3 0 FSLX (SUTURE) ×2 IMPLANT
SUT PROLENE 2 0 SH DA (SUTURE) IMPLANT
SUT PROLENE 4 0 PS 2 18 (SUTURE) ×4 IMPLANT
SYR CONTROL 10ML LL (SYRINGE) ×4 IMPLANT
UNDERPAD 30X36 HEAVY ABSORB (UNDERPADS AND DIAPERS) ×2 IMPLANT
WATER STERILE IRR 1000ML POUR (IV SOLUTION) ×2 IMPLANT

## 2023-03-24 NOTE — Transfer of Care (Signed)
 Immediate Anesthesia Transfer of Care Note  [name]  Procedure(s) Performed: LEFT FOOT INCISION AND DRAINAGE WITH THIRD METATARSAL HEAD EXCISION, ANTIBIOTIC BEADS (Left: Toe)  Patient Location: PACU  Anesthesia Type:MAC  Level of Consciousness: drowsy and responds to stimulation  Airway & Oxygen Therapy: Patient Spontanous Breathing  Post-op Assessment: Report given to RN and Post -op Vital signs reviewed and stable  Post vital signs: Reviewed and stable  Last Vitals:  Vitals Value Taken Time  BP 104/57 03/24/23 0952  Temp    Pulse 53 03/24/23 0954  Resp 14 03/24/23 0954  SpO2 94 % 03/24/23 0954  Vitals shown include unfiled device data.  Last Pain:  Vitals:   03/24/23 0756  TempSrc: Axillary  PainSc: 0-No pain         Complications: No notable events documented.

## 2023-03-24 NOTE — Plan of Care (Signed)
  Problem: Education: Goal: Knowledge of General Education information will improve Description: Including pain rating scale, medication(s)/side effects and non-pharmacologic comfort measures Outcome: Progressing   Problem: Health Behavior/Discharge Planning: Goal: Ability to manage health-related needs will improve Outcome: Progressing   Problem: Activity: Goal: Risk for activity intolerance will decrease Outcome: Progressing   Problem: Elimination: Goal: Will not experience complications related to urinary retention Outcome: Progressing   Problem: Pain Managment: Goal: General experience of comfort will improve and/or be controlled Outcome: Progressing

## 2023-03-24 NOTE — Progress Notes (Signed)
 History and Physical Interval Note:  03/24/2023 7:59 AM  [name]  has presented today for surgery, with the diagnosis of osteomyelitis of left 3rd metatarsal head.  The various methods of treatment have been discussed with the patient and family. After consideration of risks, benefits and other options for treatment, the patient has consented to   Procedure(s) with comments: I&D with METATARSAL HEAD EXCISION (Left) - Left 3rd met head resection, antibiotic beads, possible graft/vac as a surgical intervention.  The patient's history has been reviewed, patient examined, no change in status, stable for surgery.  I have reviewed the patient's chart and labs.  Questions were answered to the patient's satisfaction.     Jenelle Mages Philomene Haff

## 2023-03-24 NOTE — Progress Notes (Signed)
  Progress Note   [name] OZH:[id number] DOB: 02-Jan-1966 DOA: 03/23/2023     1 DOS: the patient was seen and examined on 03/24/2023   Brief hospital course: 58 y.o. male with medical history significant of hypertension, ulcer left foot, osteomyelitis s/p left third toe resection in 2023, peripheral neuropathy, chronic pain who presents for presents with a wound on the top of his foot. He was referred by Dr. Lilian Kapur for evaluation of a foot infection.   Assessment and Plan: Cellulitis of the left foot Osteomyelitis of the left foot -Pt presented with redness, swelling ,and wound of the dorsal aspect of the left foot near previous amputation site for osteomyelitis back in 2023.   -He had been placed on Augmentin x-ray imaging of the left foot significant for distraction of the portion of the head of the third metatarsal with bone fragment present in this region concerning for osteomyelitis. -Podiatry consulted. Pt now s/p I/D of wound with antibiotic bead placement   Essential hypertension Blood pressures currently stable and controlled   Lymphedema Chronic.  Patient reports having lymphedema of the right lower extremity. -Continue furosemide per home regimen   Peripheral neuropathy -Continue gabapentin   History of opioid abuse -Continue Suboxone       Subjective: Pt seen post-op. Complains of residual post-op pains, no other complaints  Physical Exam: Vitals:   03/24/23 0952 03/24/23 1000 03/24/23 1015 03/24/23 1038  BP: (!) 104/57 (!) 102/58 (!) 105/59 109/65  Pulse: (!) 53 (!) 54 (!) 50 70  Resp: 13 13 13 17   Temp: 97.6 F (36.4 C)  97.6 F (36.4 C) 98.4 F (36.9 C)  TempSrc:    Oral  SpO2: 94% 93% 94% 94%  Weight:       General exam: Conversant, in no acute distress Respiratory system: normal chest rise, clear, no audible wheezing Cardiovascular system: regular rhythm, s1-s2 Gastrointestinal system: Nondistended, nontender, pos BS Central nervous  system: No seizures, no tremors Extremities: No cyanosis, no joint deformities, LLE with post-op dressings in place Skin: No rashes, no pallor Psychiatry: Affect normal // no auditory hallucinations   Data Reviewed:  Labs reviewed: Na 136, K 4.1, Cr 0.90, WBC 6.4, Hgb 12.2  Family Communication: Pt in room, family not at bedside  Disposition: Status is: Inpatient Remains inpatient appropriate because: severity of illness  Planned Discharge Destination: Home    Author: Rickey Barbara, MD 03/24/2023 3:14 PM  For on call review [email or web address].

## 2023-03-24 NOTE — Hospital Course (Signed)
 58 y.o. male with medical history significant of hypertension, ulcer left foot, osteomyelitis s/p left third toe resection in 2023, peripheral neuropathy, chronic pain who presents for presents with a wound on the top of his foot. He was referred by Dr. Lilian Kapur for evaluation of a foot infection.

## 2023-03-24 NOTE — Op Note (Signed)
 Full Operative Report  Date of Operation: 9:49 AM, 03/24/2023   [name] - 58 y.o. male  Surgeon: [facility], DPM   Assistant: None  Diagnosis: Osteomyelitis and abscess of left foot  Procedure:  1.  Incision and drainage with resection third metatarsal head, left foot 2.  Dissolvable antibiotic bead application, left foot    Anesthesia: Monitor Anesthesia Care  Stoltzfus, Nelle Don, DO  Anesthesiologist: Atilano Median, DO CRNA: Loleta Rose, CRNA   Estimated Blood Loss: 20 mL  Hemostasis: 1) Anatomical dissection, mechanical compression, electrocautery 2) no tourniquet was used during procedure  Implants: * No implants in log *  Materials: Prolene 2-0  Injectables: 1) Pre-operatively: 0 cc of 50:50 mixture 1%lidocaine plain and 0.5% marcaine plain 2) Post-operatively: 20 cc of 50:50 mixture 1%lidocaine plain and 0.5% marcaine plain  Specimens: Pathology: Third metatarsal head and distal shaft with proximal margin inked microbiology: Bone culture third metatarsal head, tissue culture left foot   Antibiotics: IV antibiotics given per schedule on the floor  Drains: None  Complications: Patient tolerated the procedure well without complication.   Operative findings: As below in detailed report  Indications for Procedure: [name] presents to [facility], [place] with a chief complaint of draining ulceration dorsal to the third metatarsal head with concern for underlying osteomyelitis and abscess.  The patient has failed conservative treatments of various modalities. At this time the patient has elected to proceed with surgical correction. All alternatives, risks, and complications of the procedures were thoroughly explained to the patient. Patient exhibits appropriate understanding of all discussion points and informed consent was signed and obtained in the chart with no guarantees to surgical outcome given or  implied.  Description of Procedure: Patient was brought to the operating room. Patient remained on their hospital bed in the supine position. A surgical timeout was performed and all members of the operating room, the procedure, and the surgical site were identified. anesthesia occurred as per anesthesia record. Local anesthetic as previously described was then injected about the operative field in a local infiltrative block at the conclusion of the case.  The operative lower extremity as noted above was then prepped and draped in the usual sterile manner. The following procedure then began.  Attention was directed to the left foot.  Longitudinal incision was carried out dorsal third metatarsal head.  Excision of the ulceration was completed.  Tissue culture was taken at this time from the area directly underlying the ulceration.  Small amount of purulence was expressed.  Next incision was deepened along the third metatarsal head and shaft.  Dissection along the medial lateral aspect of third metatarsal was completed and exposure was gained.  Next using sagittal saw transverse osteotomy was completed at the midshaft of the third metatarsal.  The distal aspect of the third metatarsal and third metatarsal head was then resected with 15 blade scalpel and passed the back table with proximal margin inked and sent to pathology.  Separately a piece of bone was harvested from the distal aspect of the third metatarsal head and sent for bone culture.  Next the surgical cavity was irrigated thoroughly with 3 L of sterile saline via power pulse lavage.  Hemostasis was achieved and there was noted to be no active or pulsatile bleeding.  Antibiotic beads dissolvable calcium sulfate beads were then prepared on the back table.  They were mixed with 1/2 g of vancomycin and 3 mL of tobramycin.  Small beads were  formed.  These were then implanted within the surgical site.  A single Prolene retention suture dorsally was placed.   Then 1 inch iodoform packing was then packed into the surgical cavity.  The surgical site was then dressed with Betadine Adaptic 4 x 4 gauze ABD pad Kerlix Ace wrap. The patient tolerated both the procedure and anesthesia well with vital signs stable throughout. The patient was transferred in good condition and all vital signs stable  from the OR to recovery under the discretion of anesthesia.  Condition: Vital signs stable, neurovascular status unchanged from preoperative   Surgical plan:  Plan return to the OR in 2 days for repeat washout and delayed primary closure.  Nonweightbearing left foot until then.  Follow cultures and pathology. HOLD ANTICOAGULATION UNTIL AFTER NEXT PROCEDURE.  The patient will be nonweightbearing in a soft dressing and postop shoe to the operative limb until further instructed. The dressing is to remain clean, dry, and intact. Will continue to follow unless noted elsewhere.   Carlena Hurl, DPM [facility]

## 2023-03-24 NOTE — Anesthesia Postprocedure Evaluation (Signed)
 Anesthesia Post Note  [name]  Procedure(s) Performed: LEFT FOOT INCISION AND DRAINAGE WITH THIRD METATARSAL HEAD EXCISION, ANTIBIOTIC BEADS (Left: Toe)     Patient location during evaluation: PACU Anesthesia Type: MAC Level of consciousness: awake and alert Pain management: pain level controlled Vital Signs Assessment: post-procedure vital signs reviewed and stable Respiratory status: spontaneous breathing, nonlabored ventilation, respiratory function stable and patient connected to nasal cannula oxygen Cardiovascular status: stable and blood pressure returned to baseline Postop Assessment: no apparent nausea or vomiting Anesthetic complications: no   No notable events documented.  Last Vitals:  Vitals:   03/24/23 1015 03/24/23 1038  BP: (!) 105/59 109/65  Pulse: (!) 50 70  Resp: 13 17  Temp: 36.4 C 36.9 C  SpO2: 94% 94%    Last Pain:  Vitals:   03/24/23 1310  TempSrc:   PainSc: 2                  Nelle Don Malasha Kleppe

## 2023-03-24 NOTE — Anesthesia Preprocedure Evaluation (Addendum)
 Anesthesia Evaluation  Patient identified by MRN, date of birth, ID band Patient awake    Reviewed: Allergy & Precautions, NPO status , Patient's Chart, lab work & pertinent test results  Airway Mallampati: II  TM Distance: >3 FB Neck ROM: Full    Dental  (+) Poor Dentition, Missing   Pulmonary sleep apnea , Current Smoker and Patient abstained from smoking.   Pulmonary exam normal        Cardiovascular hypertension,  Rhythm:Regular Rate:Normal     Neuro/Psych negative neurological ROS  negative psych ROS   GI/Hepatic negative GI ROS, Neg liver ROS,,,  Endo/Other  negative endocrine ROS    Renal/GU negative Renal ROS  negative genitourinary   Musculoskeletal  (+) Arthritis , Osteoarthritis,  Osteomyelitis left toe    Abdominal Normal abdominal exam  (+)   Peds  Hematology Lab Results      Component                Value               Date                      WBC                      6.4                 03/24/2023                HGB                      12.2 (L)            03/24/2023                HCT                      35.5 (L)            03/24/2023                MCV                      89.2                03/24/2023                PLT                      256                 03/24/2023              Anesthesia Other Findings   Reproductive/Obstetrics                             Anesthesia Physical Anesthesia Plan  ASA: 3  Anesthesia Plan: MAC   Post-op Pain Management:    Induction: Intravenous  PONV Risk Score and Plan: Ondansetron, Dexamethasone, Propofol infusion, Treatment may vary due to age or medical condition and Midazolam  Airway Management Planned: Simple Face Mask and Nasal Cannula  Additional Equipment: None  Intra-op Plan:   Post-operative Plan:   Informed Consent: I have reviewed the patients History and Physical, chart, labs and discussed the procedure  including the risks, benefits and alternatives for the proposed anesthesia with the patient or  authorized representative who has indicated his/her understanding and acceptance.     Dental advisory given  Plan Discussed with: CRNA  Anesthesia Plan Comments:        Anesthesia Quick Evaluation

## 2023-03-24 NOTE — Plan of Care (Signed)

## 2023-03-24 NOTE — Progress Notes (Signed)
 Orthopedic Tech Progress Note Patient Details:  [name] 1965-02-04 [id number]  Ortho Devices Type of Ortho Device: Postop shoe/boot Ortho Device/Splint Location: LLE Ortho Device/Splint Interventions: Ordered   Post Interventions Patient Tolerated: Well Instructions Provided: Care of device  Donald Pore 03/24/2023, 10:29 AM

## 2023-03-25 ENCOUNTER — Encounter (HOSPITAL_COMMUNITY): Payer: Self-pay | Admitting: Podiatry

## 2023-03-25 DIAGNOSIS — M869 Osteomyelitis, unspecified: Secondary | ICD-10-CM | POA: Diagnosis not present

## 2023-03-25 DIAGNOSIS — F1110 Opioid abuse, uncomplicated: Secondary | ICD-10-CM | POA: Diagnosis not present

## 2023-03-25 DIAGNOSIS — K5900 Constipation, unspecified: Secondary | ICD-10-CM

## 2023-03-25 DIAGNOSIS — L03116 Cellulitis of left lower limb: Secondary | ICD-10-CM | POA: Diagnosis not present

## 2023-03-25 HISTORY — DX: Constipation, unspecified: K59.00

## 2023-03-25 LAB — COMPREHENSIVE METABOLIC PANEL
ALT: 19 U/L (ref 0–44)
AST: 16 U/L (ref 15–41)
Albumin: 3.1 g/dL — ABNORMAL LOW (ref 3.5–5.0)
Alkaline Phosphatase: 71 U/L (ref 38–126)
Anion gap: 8 (ref 5–15)
BUN: 16 mg/dL (ref 6–20)
CO2: 26 mmol/L (ref 22–32)
Calcium: 9 mg/dL (ref 8.9–10.3)
Chloride: 103 mmol/L (ref 98–111)
Creatinine, Ser: 0.85 mg/dL (ref 0.61–1.24)
GFR, Estimated: >60 mL/min (ref >60)
Glucose, Bld: 104 mg/dL — ABNORMAL HIGH (ref 70–99)
Potassium: 4.2 mmol/L (ref 3.5–5.1)
Sodium: 137 mmol/L (ref 135–145)
Total Bilirubin: 0.6 mg/dL (ref 0.0–1.2)
Total Protein: 6.5 g/dL (ref 6.5–8.1)

## 2023-03-25 LAB — CBC
HCT: 36.8 % — ABNORMAL LOW (ref 39.0–52.0)
Hemoglobin: 12.7 g/dL — ABNORMAL LOW (ref 13.0–17.0)
MCH: 30.9 pg (ref 26.0–34.0)
MCHC: 34.5 g/dL (ref 30.0–36.0)
MCV: 89.5 fL (ref 80.0–100.0)
Platelets: 274 10*3/uL (ref 150–400)
RBC: 4.11 MIL/uL — ABNORMAL LOW (ref 4.22–5.81)
RDW: 11.8 % (ref 11.5–15.5)
WBC: 8.3 10*3/uL (ref 4.0–10.5)
nRBC: 0 % (ref 0.0–0.2)

## 2023-03-25 LAB — SURGICAL PATHOLOGY

## 2023-03-25 MED ORDER — SENNOSIDES-DOCUSATE SODIUM 8.6-50 MG PO TABS
1.0000 | ORAL_TABLET | Freq: Two times a day (BID) | ORAL | Status: DC
  Administered 2023-03-26: 1 via ORAL
  Filled 2023-03-25 (×3): qty 1

## 2023-03-25 MED ORDER — POLYETHYLENE GLYCOL 3350 17 G PO PACK
17.0000 g | PACK | Freq: Two times a day (BID) | ORAL | Status: DC
  Administered 2023-03-25 – 2023-03-26 (×2): 17 g via ORAL
  Filled 2023-03-25 (×4): qty 1

## 2023-03-25 MED ORDER — DAPTOMYCIN-SODIUM CHLORIDE 700-0.9 MG/100ML-% IV SOLN
700.0000 mg | Freq: Every day | INTRAVENOUS | Status: DC
  Administered 2023-03-25 – 2023-03-26 (×2): 700 mg via INTRAVENOUS
  Filled 2023-03-25 (×4): qty 100

## 2023-03-25 MED ORDER — SODIUM CHLORIDE 0.9 % IV SOLN
3.0000 g | Freq: Four times a day (QID) | INTRAVENOUS | Status: DC
  Administered 2023-03-25 – 2023-03-27 (×8): 3 g via INTRAVENOUS
  Filled 2023-03-25 (×8): qty 8

## 2023-03-25 NOTE — Progress Notes (Signed)
  Subjective:  Patient ID: LINDBERG ZENON, male    DOB: 1965/02/16,  MRN: [id number]  No chief complaint on file.   DOS: 03/24/23 Procedure:  1.  Incision and drainage with resection third metatarsal head, left foot 2.  Dissolvable antibiotic bead application, left foot     58 y.o. male seen for post op check. He reports some pain in his left foot though overall doing well.  Has tried to keep weight off left foot as instructed.  Does have postop shoe in room.  Aware of plans for return to the OR tomorrow and findings from surgery yesterday.  All questions were answered  Review of Systems: Negative except as noted in the HPI. Denies N/V/F/Ch.   Objective:   Vitals:   03/25/23 0829 03/25/23 1416  BP: (!) 147/81 126/78  Pulse: (!) 56 63  Resp: 18 18  Temp: 98.2 F (36.8 C) 99.2 F (37.3 C)  SpO2: 97% 95%   Body mass index is 29.21 kg/m. Constitutional Well developed. Well nourished.  Vascular Foot warm and well perfused. Capillary refill normal to all digits.   No calf pain with palpation  Neurologic Normal speech. Oriented to person, place, and time. Epicritic sensation diminished left foot  Dermatologic Dressing clean dry and intact to left foot  Orthopedic: Status post third metatarsal head and distal shaft resection   Radiographs: Status post amputation of the mid third metatarsal distally  Pathology: Left third metatarsal excision fragments of bone and fibroconnective tissue with acute inflammation  Micro: Pending  Assessment:   1. Osteomyelitis of left foot, unspecified type (HCC)   Osteomyelitis of third metatarsal head status post resection and I&D antibiotic bead placement postop day 1   Plan:  Patient was evaluated and treated and all questions answered.  POD # 1 s/p I&D with partial third metatarsal resection and antibiotic beads -Progressing as expected postop, surgical pathology consistent with acute inflammation of the bone.  No read on proximal  margin which was inked at the time of surgery -NPO past MN for return to OR tomorrow 1130 for repeat irrigation and debridement, possible antibiotic beads and possible delayed closure. -XR: Expected post op changes.  -WB Status: WBAT in post op shoe for transfers and short distance -Medications/ABX: Continue IV abx broad spectrum pending further culture data        Corinna Gab, DPM [facility] / [facility]

## 2023-03-25 NOTE — Progress Notes (Signed)
 PROGRESS NOTE    [name]  ZDG:[id number] DOB: [birth year] DOA: 03/23/2023 PCP: [name], MD   No chief complaint on file.   Brief Narrative:  58 y.o. male with medical history significant of hypertension, ulcer left foot, osteomyelitis s/p left third toe resection in 2023, peripheral neuropathy, chronic pain who presents for presents with a wound on the top of his foot. He was referred by Dr. Lilian Kapur for evaluation of a foot infection.    Assessment & Plan:   Principal Problem:   Osteomyelitis (HCC) Active Problems:   Cellulitis of left foot   Hypertension   Lymphedema   Peripheral neuropathy   Nondependent opioid abuse (HCC)   Constipation  #1 osteomyelitis of the left foot/cellulitis of the left foot -Patient presented with findings concerning for cellulitis with erythema, edema, wound on the dorsal aspect of the left foot near the previous amputation site for osteomyelitis back in 2023. -Patient status post Augmentin in the outpatient setting as x-ray imaging of the left foot was significant for distraction of the portion of the head of the third metatarsal with bone fragment present in this region concerning for osteomyelitis. -Patient underwent MRI of the left foot which was concerning for osteomyelitis of the head and distal metadiaphysis of the third metatarsal, osteomyelitis of distal phalanx of the great toe, abscess noted primarily extending dorsal but also plantar to the eroded third metatarsal head, with abnormal fluid signal tracking to ulceration along the plantar foot.  Dorsal component of abscess 19 cc in volume. -Patient seen in consultation by podiatry and patient underwent incision and drainage with resection third metatarsal head of the left foot with dissolvable antibiotic bead application of the left foot. -Patient currently not on antibiotics. -Patient to go back to the OR tomorrow for repeat washout and delayed primary closure. -Pathology  and cultures pending. -Podiatry recommending holding anticoagulation until after next procedure. -ID consulted and formal consultation to be done postoperatively tomorrow 03/26/2023. -ID recommending starting patient on daptomycin and Unasyn pending culture results. -Per podiatry.  2.  Hypertension -BP stable.  3.  Lymphedema -Chronic in nature. -Patient noted to have a lymphedema of right lower extremity. -Continue home regimen furosemide.  4.  Peripheral neuropathy -Gabapentin.  5.  History of opioid abuse -Continue Suboxone.  6.??  Constipation -Patient states has not had a bowel movement in 2 days. -Placed on MiraLAX daily, Senokot-S twice daily.   DVT prophylaxis: SCDs.  Postop DVT prophylaxis per podiatry. Code Status: Full Family Communication: Updated patient.  No family at bedside. Disposition: TBD  Status is: Inpatient Remains inpatient appropriate because: Severity of illness   Consultants:  Podiatry: Dr. Annamary Rummage 03/23/2023  Procedures:  Plain films of the left foot 03/24/2023, 03/23/2023 MRI left foot 03/23/2023 Incision and drainage with resection third metatarsal head, left foot/dissolvable antibiotic bead application left foot per podiatry: Dr. Annamary Rummage 03/24/2023  Antimicrobials:  Anti-infectives (From admission, onward)    Start     Dose/Rate Route Frequency Ordered Stop   03/25/23 1515  Ampicillin-Sulbactam (UNASYN) 3 g in sodium chloride 0.9 % 100 mL IVPB        3 g 200 mL/hr over 30 Minutes Intravenous Every 6 hours 03/25/23 1429     03/25/23 1515  DAPTOmycin (CUBICIN) IVPB 700 mg/111mL premix        700 mg 200 mL/hr over 30 Minutes Intravenous Daily 03/25/23 1429     03/24/23 1004  vancomycin (VANCOCIN) powder  Status:  Discontinued  As needed 03/24/23 1004 03/24/23 1012   03/24/23 0945  tobramycin (NEBCIN) injection  Status:  Discontinued          As needed 03/24/23 1003 03/24/23 1012   03/24/23 0930  vancomycin (VANCOREADY) IVPB  1500 mg/300 mL        1,500 mg 150 mL/hr over 120 Minutes Intravenous  Once 03/24/23 0920 03/24/23 1128   03/24/23 0903  ceFAZolin (ANCEF) 2-4 GM/100ML-% IVPB       Note to Pharmacy: Susy [place] L: cabinet override      03/24/23 1610 03/24/23 2114         Subjective: Patient laying in bed. No CP, no SOB.  Patient states has not had a bowel movement in 2 days.  Objective: Vitals:   03/24/23 1919 03/25/23 0530 03/25/23 0829 03/25/23 1416  BP: 116/81 119/71 (!) 147/81 126/78  Pulse: (!) 57 (!) 55 (!) 56 63  Resp: 16 16 18 18   Temp: 98.3 F (36.8 C) 97.7 F (36.5 C) 98.2 F (36.8 C) 99.2 F (37.3 C)  TempSrc: Oral Oral Oral Oral  SpO2: 97% 95% 97% 95%  Weight:        Intake/Output Summary (Last 24 hours) at 03/25/2023 1441 Last data filed at 03/25/2023 1100 Gross per 24 hour  Intake --  Output 1650 ml  Net -1650 ml   Filed Weights   03/23/23 1121  Weight: 106 kg    Examination:  General exam: Appears calm and comfortable  Respiratory system: Clear to auscultation. Respiratory effort normal. Cardiovascular system: S1 & S2 heard, RRR. No JVD, murmurs, rubs, gallops or clicks. No pedal edema. Gastrointestinal system: Abdomen is nondistended, soft and nontender. No organomegaly or masses felt. Normal bowel sounds heard. Central nervous system: Alert and oriented. No focal neurological deficits. Extremities: Left foot wrapped in postop bandage.  Skin: No rashes, lesions or ulcers Psychiatry: Judgement and insight appear normal. Mood & affect appropriate.     Data Reviewed: I have personally reviewed following labs and imaging studies  CBC: Recent Labs  Lab 03/23/23 1130 03/24/23 0625 03/25/23 0615  WBC 6.2 6.4 8.3  NEUTROABS 4.3  --   --   HGB 12.8* 12.2* 12.7*  HCT 37.7* 35.5* 36.8*  MCV 89.8 89.2 89.5  PLT 264 256 274    Basic Metabolic Panel: Recent Labs  Lab 03/23/23 1130 03/24/23 0625 03/25/23 0615  NA 137 136 137  K 3.9 4.1 4.2  CL 105 106  103  CO2 21* 24 26  GLUCOSE 143* 108* 104*  BUN 18 15 16   CREATININE 0.86 0.90 0.85  CALCIUM 9.3 8.7* 9.0    GFR: Estimated Creatinine Clearance: 123.2 mL/min (by C-G formula based on SCr of 0.85 mg/dL).  Liver Function Tests: Recent Labs  Lab 03/23/23 1130 03/25/23 0615  AST 24 16  ALT 24 19  ALKPHOS 75 71  BILITOT 0.8 0.6  PROT 7.0 6.5  ALBUMIN 3.4* 3.1*    CBG: No results for input(s): "GLUCAP" in the last 168 hours.   Recent Results (from the past 240 hours)  Culture, blood (single) w Reflex to ID Panel     Status: None (Preliminary result)   Collection Time: 03/23/23  7:48 PM   Specimen: BLOOD LEFT ARM  Result Value Ref Range Status   Specimen Description BLOOD LEFT ARM  Final   Special Requests   Final    BOTTLES DRAWN AEROBIC AND ANAEROBIC Blood Culture results may not be optimal due to an inadequate volume of  blood received in culture bottles   Culture   Final    NO GROWTH 2 DAYS Performed at [facility], [address], [place], [place] [address]    Report Status PENDING  Incomplete  Aerobic/Anaerobic Culture w Gram Stain (surgical/deep wound)     Status: None (Preliminary result)   Collection Time: 03/24/23  9:34 AM   Specimen: PATH Amputaion Arm/Leg; Tissue  Result Value Ref Range Status   Specimen Description TISSUE  Final   Special Requests SOURCE A  Final   Gram Stain NO WBC SEEN NO ORGANISMS SEEN   Final   Culture   Final    CULTURE REINCUBATED FOR BETTER GROWTH Performed at [facility], [address] [place], [place] [address]    Report Status PENDING  Incomplete  Aerobic/Anaerobic Culture w Gram Stain (surgical/deep wound)     Status: None (Preliminary result)   Collection Time: 03/24/23  9:35 AM   Specimen: PATH Amputaion Arm/Leg; Tissue  Result Value Ref Range Status   Specimen Description TISSUE  Final   Special Requests NONE  Final   Gram Stain NO WBC SEEN NO ORGANISMS SEEN   Final   Culture   Final    NO GROWTH <  24 HOURS Performed at [facility], [address] [place], [place] [address]    Report Status PENDING  Incomplete         Radiology Studies: DG Foot 2 Views Left Result Date: 03/24/2023 CLINICAL DATA:  Status post third metatarsal amputation for osteomyelitis. EXAM: LEFT FOOT - 2 VIEW COMPARISON:  Radiograph dated 03/23/2023. FINDINGS: Status post amputation of the mid third metatarsal. No acute fracture or dislocation. The bones are osteopenic. Postsurgical changes of the foot with soft tissue swelling. Antibiotic beads noted. IMPRESSION: Status post amputation of the mid third metatarsal. Electronically Signed   By: Elgie Collard M.D.   On: 03/24/2023 14:07   MR FOOT LEFT WO CONTRAST Result Date: 03/23/2023 CLINICAL DATA:  Osteomyelitis/abscess. EXAM: MRI OF THE LEFT FOOT WITHOUT CONTRAST TECHNIQUE: Multiplanar, multisequence MR imaging of the left foot was performed. No intravenous contrast was administered. COMPARISON:  03/23/2023 FINDINGS: Bones/Joint/Cartilage Prior third toe amputation. Osteomyelitis of the head and distal metadiaphysis of the third metatarsal. Second digit hammertoe deformity with hyper extension and dorsal subluxation at the distal interphalangeal joint. Abnormal edema in the distal phalanx of the great toe with erosion of the tuft compatible with osteomyelitis. Abnormal lateral erosion of the head of the first metatarsal with underlying marrow edema, possibly from a degenerative lesion and less likely to be from infection. Ligaments Poorly assessed due to motion Muscles and Tendons Regional muscular atrophy, likely neurogenic. Soft tissues Substantial abscess primarily extending dorsal but also plantar to the eroded third metatarsal head, with abnormal fluid signal tracking to ulceration along the plantar foot. Cannot exclude draining sinus tract. The dorsal component of the abscess measures 5.0 by 2.4 by 3.1 cm (volume = 19 cm^3). Subcutaneous edema tracks  dorsally in the foot and also in the great toe and along the plantar ball of the foot especially in the vicinity of the presumed ulceration. This likely reflects cellulitis. IMPRESSION: 1. Osteomyelitis of the head and distal metadiaphysis of the third metatarsal. 2. Osteomyelitis of the distal phalanx of the great toe. 3. Substantial abscess primarily extending dorsal but also plantar to the eroded third metatarsal head, with abnormal fluid signal tracking to ulceration along the plantar foot. Cannot exclude plantar draining sinus tract.  Dorsal component of abscess 19 cc in volume. 4. Abnormal lateral erosion of the head of the first metatarsal with underlying marrow edema, possibly from a degenerative lesion and less likely to be from infection. 5. Second digit hammertoe deformity with hyper extension and dorsal subluxation at the distal interphalangeal joint. 6. 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 Electronically Signed   By: Gaylyn Rong M.D.   On: 03/23/2023 19:25        Scheduled Meds:  buprenorphine-naloxone  0.5 tablet Sublingual TID   furosemide  40 mg Oral q morning   gabapentin  100 mg Oral QHS   polyethylene glycol  17 g Oral BID   potassium chloride SA  20 mEq Oral Daily   senna-docusate  1 tablet Oral BID   sodium chloride flush  3 mL Intravenous Q12H   Continuous Infusions:  ampicillin-sulbactam (UNASYN) IV     DAPTOmycin       LOS: 2 days    Time spent: 40 minutes    Ramiro Harvest, MD [facility]   To contact the attending provider between 7A-7P or the covering provider during after hours 7P-7A, please log into the web site [email or web address] and access using universal [place] password for that web site. If you do not have the password, please call the hospital operator.  03/25/2023, 2:41 PM

## 2023-03-25 NOTE — Plan of Care (Signed)

## 2023-03-26 ENCOUNTER — Other Ambulatory Visit: Payer: Self-pay

## 2023-03-26 ENCOUNTER — Inpatient Hospital Stay (HOSPITAL_COMMUNITY): Payer: BC Managed Care – PPO | Admitting: Anesthesiology

## 2023-03-26 ENCOUNTER — Encounter (HOSPITAL_BASED_OUTPATIENT_CLINIC_OR_DEPARTMENT_OTHER): Payer: Self-pay | Admitting: Internal Medicine

## 2023-03-26 ENCOUNTER — Encounter (HOSPITAL_COMMUNITY)
Admission: EM | Disposition: A | Discharge status: Home / Self Care | Payer: Self-pay | Source: Home / Self Care | Attending: Internal Medicine

## 2023-03-26 DIAGNOSIS — K5900 Constipation, unspecified: Secondary | ICD-10-CM | POA: Diagnosis not present

## 2023-03-26 DIAGNOSIS — F1110 Opioid abuse, uncomplicated: Secondary | ICD-10-CM | POA: Diagnosis not present

## 2023-03-26 DIAGNOSIS — I890 Lymphedema, not elsewhere classified: Secondary | ICD-10-CM | POA: Diagnosis not present

## 2023-03-26 DIAGNOSIS — B957 Other staphylococcus as the cause of diseases classified elsewhere: Secondary | ICD-10-CM | POA: Diagnosis not present

## 2023-03-26 DIAGNOSIS — L03116 Cellulitis of left lower limb: Secondary | ICD-10-CM | POA: Diagnosis not present

## 2023-03-26 DIAGNOSIS — M86172 Other acute osteomyelitis, left ankle and foot: Secondary | ICD-10-CM | POA: Diagnosis not present

## 2023-03-26 DIAGNOSIS — M869 Osteomyelitis, unspecified: Secondary | ICD-10-CM | POA: Diagnosis not present

## 2023-03-26 HISTORY — PX: IRRIGATION AND DEBRIDEMENT FOOT: SHX6602

## 2023-03-26 LAB — CBC WITH DIFFERENTIAL/PLATELET
Abs Immature Granulocytes: 0.03 10*3/uL (ref 0.00–0.07)
Basophils Absolute: 0.1 10*3/uL (ref 0.0–0.1)
Basophils Relative: 1 %
Eosinophils Absolute: 0.6 10*3/uL — ABNORMAL HIGH (ref 0.0–0.5)
Eosinophils Relative: 6 %
HCT: 41.5 % (ref 39.0–52.0)
Hemoglobin: 14.3 g/dL (ref 13.0–17.0)
Immature Granulocytes: 0 %
Lymphocytes Relative: 23 %
Lymphs Abs: 2.1 10*3/uL (ref 0.7–4.0)
MCH: 30.6 pg (ref 26.0–34.0)
MCHC: 34.5 g/dL (ref 30.0–36.0)
MCV: 88.9 fL (ref 80.0–100.0)
Monocytes Absolute: 1.1 10*3/uL — ABNORMAL HIGH (ref 0.1–1.0)
Monocytes Relative: 12 %
Neutro Abs: 5.4 10*3/uL (ref 1.7–7.7)
Neutrophils Relative %: 58 %
Platelets: 303 10*3/uL (ref 150–400)
RBC: 4.67 MIL/uL (ref 4.22–5.81)
RDW: 11.8 % (ref 11.5–15.5)
WBC: 9.2 10*3/uL (ref 4.0–10.5)
nRBC: 0 % (ref 0.0–0.2)

## 2023-03-26 LAB — RENAL FUNCTION PANEL
Albumin: 3.3 g/dL — ABNORMAL LOW (ref 3.5–5.0)
Anion gap: 10 (ref 5–15)
BUN: 17 mg/dL (ref 6–20)
CO2: 27 mmol/L (ref 22–32)
Calcium: 9.4 mg/dL (ref 8.9–10.3)
Chloride: 101 mmol/L (ref 98–111)
Creatinine, Ser: 0.89 mg/dL (ref 0.61–1.24)
GFR, Estimated: >60 mL/min (ref >60)
Glucose, Bld: 100 mg/dL — ABNORMAL HIGH (ref 70–99)
Phosphorus: 4.1 mg/dL (ref 2.5–4.6)
Potassium: 4.6 mmol/L (ref 3.5–5.1)
Sodium: 138 mmol/L (ref 135–145)

## 2023-03-26 LAB — CK: Total CK: 58 U/L (ref 49–397)

## 2023-03-26 SURGERY — IRRIGATION AND DEBRIDEMENT FOOT
Anesthesia: Monitor Anesthesia Care | Site: Foot | Laterality: Left

## 2023-03-26 MED ORDER — PROPOFOL 10 MG/ML IV BOLUS
INTRAVENOUS | Status: AC
  Filled 2023-03-26: qty 20

## 2023-03-26 MED ORDER — DEXMEDETOMIDINE HCL IN NACL 80 MCG/20ML IV SOLN
INTRAVENOUS | Status: DC | PRN
  Administered 2023-03-26: 8 ug via INTRAVENOUS

## 2023-03-26 MED ORDER — TOBRAMYCIN SULFATE 80 MG/2ML IJ SOLN
INTRAMUSCULAR | Status: AC
  Filled 2023-03-26: qty 4

## 2023-03-26 MED ORDER — ORAL CARE MOUTH RINSE: 15.0000 mL | Freq: Once | OROMUCOSAL | Status: AC

## 2023-03-26 MED ORDER — ONDANSETRON HCL 4 MG/2ML IJ SOLN
INTRAMUSCULAR | Status: AC
  Filled 2023-03-26: qty 2

## 2023-03-26 MED ORDER — TOBRAMYCIN SULFATE 80 MG/2ML IJ SOLN
INTRAMUSCULAR | Status: DC | PRN
  Administered 2023-03-26: 80 mg via INTRAMUSCULAR

## 2023-03-26 MED ORDER — EPHEDRINE 5 MG/ML INJ
INTRAVENOUS | Status: AC
  Filled 2023-03-26: qty 5

## 2023-03-26 MED ORDER — BUPIVACAINE HCL 0.5 % IJ SOLN
INTRAMUSCULAR | Status: AC
  Filled 2023-03-26: qty 1

## 2023-03-26 MED ORDER — DEXAMETHASONE SODIUM PHOSPHATE 10 MG/ML IJ SOLN
INTRAMUSCULAR | Status: AC
  Filled 2023-03-26: qty 1

## 2023-03-26 MED ORDER — DEXMEDETOMIDINE HCL IN NACL 80 MCG/20ML IV SOLN
INTRAVENOUS | Status: AC
  Filled 2023-03-26: qty 20

## 2023-03-26 MED ORDER — PHENYLEPHRINE 80 MCG/ML (10ML) SYRINGE FOR IV PUSH (FOR BLOOD PRESSURE SUPPORT)
PREFILLED_SYRINGE | INTRAVENOUS | Status: AC
  Filled 2023-03-26: qty 10

## 2023-03-26 MED ORDER — ONDANSETRON HCL 4 MG/2ML IJ SOLN: 4.0000 mg | Freq: Four times a day (QID) | INTRAMUSCULAR | Status: DC | PRN

## 2023-03-26 MED ORDER — MIDAZOLAM HCL 2 MG/2ML IJ SOLN
INTRAMUSCULAR | Status: DC | PRN
  Administered 2023-03-26: 2 mg via INTRAVENOUS

## 2023-03-26 MED ORDER — MIDAZOLAM HCL 2 MG/2ML IJ SOLN
INTRAMUSCULAR | Status: AC
Start: 2023-03-26 — End: ?
  Filled 2023-03-26: qty 2

## 2023-03-26 MED ORDER — LACTATED RINGERS IV SOLN: INTRAVENOUS | Status: DC

## 2023-03-26 MED ORDER — CHLORHEXIDINE GLUCONATE 0.12 % MT SOLN
15.0000 mL | Freq: Once | OROMUCOSAL | Status: AC
  Administered 2023-03-26: 15 mL via OROMUCOSAL
  Filled 2023-03-26: qty 15

## 2023-03-26 MED ORDER — BUPIVACAINE HCL (PF) 0.5 % IJ SOLN
INTRAMUSCULAR | Status: DC | PRN
  Administered 2023-03-26: 10 mL

## 2023-03-26 MED ORDER — FENTANYL CITRATE (PF) 100 MCG/2ML IJ SOLN: 25.0000 ug | INTRAMUSCULAR | Status: DC | PRN

## 2023-03-26 MED ORDER — FENTANYL CITRATE (PF) 250 MCG/5ML IJ SOLN
INTRAMUSCULAR | Status: DC | PRN
  Administered 2023-03-26: 50 ug via INTRAVENOUS

## 2023-03-26 MED ORDER — LACTATED RINGERS IV SOLN: INTRAVENOUS | Status: DC | PRN

## 2023-03-26 MED ORDER — PROPOFOL 500 MG/50ML IV EMUL
INTRAVENOUS | Status: DC | PRN
  Administered 2023-03-26: 100 ug/kg/min via INTRAVENOUS

## 2023-03-26 MED ORDER — VANCOMYCIN HCL 1000 MG IV SOLR
INTRAVENOUS | Status: AC
  Filled 2023-03-26: qty 20

## 2023-03-26 MED ORDER — FENTANYL CITRATE (PF) 250 MCG/5ML IJ SOLN
INTRAMUSCULAR | Status: AC
  Filled 2023-03-26: qty 5

## 2023-03-26 MED ORDER — VANCOMYCIN HCL 1000 MG IV SOLR
INTRAVENOUS | Status: DC | PRN
  Administered 2023-03-26: 1000 mg via TOPICAL

## 2023-03-26 MED ORDER — PROPOFOL 1000 MG/100ML IV EMUL
INTRAVENOUS | Status: AC
  Filled 2023-03-26: qty 100

## 2023-03-26 SURGICAL SUPPLY — 47 items
BLADE SURG 15 STRL LF DISP TIS (BLADE) ×2 IMPLANT
BNDG ELASTIC 3INX 5YD STR LF (GAUZE/BANDAGES/DRESSINGS) ×2 IMPLANT
BNDG ELASTIC 4INX 5YD STR LF (GAUZE/BANDAGES/DRESSINGS) ×2 IMPLANT
BNDG ESMARK 4X9 LF (GAUZE/BANDAGES/DRESSINGS) ×2 IMPLANT
BNDG GAUZE DERMACEA FLUFF 4 (GAUZE/BANDAGES/DRESSINGS) ×2 IMPLANT
CHLORAPREP W/TINT 26 (MISCELLANEOUS) ×2 IMPLANT
CNTNR URN SCR LID CUP LEK RST (MISCELLANEOUS) ×2 IMPLANT
COVER BACK TABLE 60X90IN (DRAPES) ×2 IMPLANT
CUFF TOURN SGL QUICK 18X4 (TOURNIQUET CUFF) ×2 IMPLANT
CUFF TRNQT CYL 24X4X16.5-23 (TOURNIQUET CUFF) ×2 IMPLANT
DRAPE 3/4 80X56 (DRAPES) ×2 IMPLANT
DRAPE EXTREMITY T 121X128X90 (DISPOSABLE) ×2 IMPLANT
DRAPE SHEET LG 3/4 BI-LAMINATE (DRAPES) ×2 IMPLANT
DRAPE U-SHAPE 47X51 STRL (DRAPES) ×2 IMPLANT
ELECT REM PT RETURN 15FT ADLT (MISCELLANEOUS) ×2 IMPLANT
GAUZE SPONGE 4X4 12PLY STRL (GAUZE/BANDAGES/DRESSINGS) ×2 IMPLANT
GAUZE XEROFORM 1X8 LF (GAUZE/BANDAGES/DRESSINGS) ×2 IMPLANT
GLOVE BIO SURGEON STRL SZ7.5 (GLOVE) ×2 IMPLANT
GLOVE BIOGEL PI IND STRL 7.5 (GLOVE) ×2 IMPLANT
GOWN STRL REUS W/ TWL XL LVL3 (GOWN DISPOSABLE) ×2 IMPLANT
GRAFT SKIN WND MICRO 38 (Tissue) IMPLANT
KIT BASIN OR (CUSTOM PROCEDURE TRAY) ×2 IMPLANT
KIT STIMULAN RAPID CURE 10CC (Orthopedic Implant) IMPLANT
KIT TURNOVER KIT B (KITS) IMPLANT
MANIFOLD NEPTUNE II (INSTRUMENTS) ×2 IMPLANT
NDL HYPO 25X1 1.5 SAFETY (NEEDLE) ×2 IMPLANT
NEEDLE HYPO 25X1 1.5 SAFETY (NEEDLE) ×1 IMPLANT
NS IRRIG 1000ML POUR BTL (IV SOLUTION) ×2 IMPLANT
PADDING CAST ABS COTTON 4X4 ST (CAST SUPPLIES) ×2 IMPLANT
PENCIL SMOKE EVACUATOR (MISCELLANEOUS) IMPLANT
SET IRRIG Y TYPE TUR BLADDER L (SET/KITS/TRAYS/PACK) ×2 IMPLANT
SPONGE T-LAP 4X18 ~~LOC~~+RFID (SPONGE) ×2 IMPLANT
STAPLER SKIN PROX WIDE 3.9 (STAPLE) ×2 IMPLANT
STOCKINETTE 6 STRL (DRAPES) ×2 IMPLANT
SUCTION TUBE FRAZIER 10FR DISP (SUCTIONS) ×2 IMPLANT
SUT ETHILON 3 0 PS 1 (SUTURE) ×2 IMPLANT
SUT ETHILON 4 0 PS 2 18 (SUTURE) ×2 IMPLANT
SUT MNCRL AB 3-0 PS2 18 (SUTURE) ×2 IMPLANT
SUT MNCRL AB 4-0 PS2 18 (SUTURE) ×2 IMPLANT
SUT VIC AB 2-0 SH 27XBRD (SUTURE) ×2 IMPLANT
SYR BULB EAR ULCER 3OZ GRN STR (SYRINGE) ×2 IMPLANT
SYR CONTROL 10ML LL (SYRINGE) ×2 IMPLANT
TUBE CONNECTING 12X1/4 (SUCTIONS) IMPLANT
TUBE IRRIGATION SET MISONIX (TUBING) ×2 IMPLANT
UNDERPAD 30X36 HEAVY ABSORB (UNDERPADS AND DIAPERS) ×2 IMPLANT
YANKAUER SUCT BULB TIP 10FT TU (MISCELLANEOUS) ×2 IMPLANT
YANKAUER SUCT BULB TIP NO VENT (SUCTIONS) ×2 IMPLANT

## 2023-03-26 NOTE — Consult Note (Signed)
 [facility]    Date of Admission:  03/23/2023     Total days of antibiotics 3  Daptomycin 2/26 >> c  Ampicillin-Sulbactam 2/26 >> c              Reason for Consult: Osteomyelitis Left Foot     Referring Provider: [name] Primary Care Provider: [name], MD    Assessment: [name] is a 58 y.o. male admitted with:   Left Foot Osteomyelitis S/P I&D - Application of Absorbable ABX Beads -  Take back today with Dr. Annamary Rummage looked clean and no signs of ongoing infection. He has a h/o MSSA in the past and was on a very long course of Augmentin since August (with a few scattered months off). Cultures showing Staph Lugdinensis.  -Continue IV unasyn + daptomycin for now  -FU staph lugdinensis susceptibilities  -Suspect PO treatment given good surgical control here.  -Following with Podiatry.  -CRP/ESR in AM for trend   Lymphedema -  Uses diuretics, compression and pumps per his discussion. May do well at lymphedema clinic for further assessment to help with healing this wound.   History of Opioid Abuse - On suboxone for management per primary  -Hep C screen for general health maintenance.     Plan: Continue daptomycin + unasyn for now Hep C Ab, CRP and ESR in AM  Plan for PO treatment pending further susceptibilities  Consider lymphedema clinic outpatient.     Principal Problem:   Osteomyelitis (HCC) Active Problems:   Hypertension   Lymphedema   Nondependent opioid abuse (HCC)   Peripheral neuropathy   Cellulitis of left foot   Constipation    buprenorphine-naloxone  0.5 tablet Sublingual TID   furosemide  40 mg Oral q morning   gabapentin  100 mg Oral QHS   polyethylene glycol  17 g Oral BID   potassium chloride SA  20 mEq Oral Daily   senna-docusate  1 tablet Oral BID   sodium chloride flush  3 mL Intravenous Q12H    HPI: [name] is a 58 y.o. male admitted for surgical management of DFU.    He struggles with lymphedema and uses stockings and machine for management.  He had a hard time healing from amputation of the left toe in Oct 2024. Went   Was on augmentin prior to hospital stay for conservative treatment which did help the foot heal - he has been on this fairly consistently since August 2024 (24m supply Aug, Sept, Nov, and recently in February PTA). After it stopped this flared up again and recommended to come in for surgery. He ran out of FMLA and PTO so that limited his ability in the past to do more definitive surgery.   Lives in [place] with wife.    Review of Systems: Review of Systems  Constitutional:  Negative for chills and fever.  Cardiovascular:  Negative for chest pain.     Past Medical History:  Diagnosis Date   Lymphedema    Scoliosis    Sleep apnea     Social History   Tobacco Use   Smoking status: Former    Current packs/day: 0.00    Types: Cigarettes, E-cigarettes    Quit date: 06/24/2022    Years since quitting: 0.7   Smokeless tobacco: Never   Tobacco comments:    I vape everyday.  [facility] 09/22/2022  Vaping Use   Vaping status: Some Days  Substances: Nicotine  Substance Use Topics   Alcohol use: Never    Family History  Problem Relation Age of Onset   Rashes / Skin problems Mother    Heart failure Father    No Known Allergies  OBJECTIVE: Blood pressure 115/69, pulse 61, temperature 97.6 F (36.4 C), temperature source Oral, resp. rate 20, height 6\' 3"  (1.905 m), weight 98.9 kg, SpO2 99%.  Physical Exam Constitutional:      Appearance: Normal appearance.  Cardiovascular:     Rate and Rhythm: Normal rate and regular rhythm.  Pulmonary:     Effort: Pulmonary effort is normal. No respiratory distress.     Breath sounds: Normal breath sounds.  Abdominal:     General: Bowel sounds are normal. There is no distension.     Palpations: Abdomen is soft.     Tenderness: There is no abdominal tenderness.  Musculoskeletal:      Right lower leg: Edema present.  Skin:    General: Skin is warm and dry.     Capillary Refill: Capillary refill takes less than 2 seconds.  Neurological:     Mental Status: He is alert and oriented to person, place, and time.     Lab Results Lab Results  Component Value Date   WBC 9.2 03/26/2023   HGB 14.3 03/26/2023   HCT 41.5 03/26/2023   MCV 88.9 03/26/2023   PLT 303 03/26/2023    Lab Results  Component Value Date   CREATININE 0.89 03/26/2023   BUN 17 03/26/2023   NA 138 03/26/2023   K 4.6 03/26/2023   CL 101 03/26/2023   CO2 27 03/26/2023    Lab Results  Component Value Date   ALT 19 03/25/2023   AST 16 03/25/2023   ALKPHOS 71 03/25/2023   BILITOT 0.6 03/25/2023     Microbiology: Recent Results (from the past 240 hours)  Culture, blood (single) w Reflex to ID Panel     Status: None (Preliminary result)   Collection Time: 03/23/23  7:48 PM   Specimen: BLOOD LEFT ARM  Result Value Ref Range Status   Specimen Description BLOOD LEFT ARM  Final   Special Requests   Final    BOTTLES DRAWN AEROBIC AND ANAEROBIC Blood Culture results may not be optimal due to an inadequate volume of blood received in culture bottles   Culture   Final    NO GROWTH 3 DAYS Performed at [facility], [address] [place], [place] [address]    Report Status PENDING  Incomplete  Aerobic/Anaerobic Culture w Gram Stain (surgical/deep wound)     Status: None (Preliminary result)   Collection Time: 03/24/23  9:34 AM   Specimen: PATH Amputaion Arm/Leg; Tissue  Result Value Ref Range Status   Specimen Description TISSUE  Final   Special Requests SOURCE A  Final   Gram Stain   Final    NO WBC SEEN NO ORGANISMS SEEN Performed at [facility], [address], [place], [place] [address]    Culture   Final    FEW STAPHYLOCOCCUS LUGDUNENSIS SUSCEPTIBILITIES TO FOLLOW NO ANAEROBES ISOLATED; CULTURE IN PROGRESS FOR 5 DAYS    Report Status PENDING  Incomplete   Aerobic/Anaerobic Culture w Gram Stain (surgical/deep wound)     Status: None (Preliminary result)   Collection Time: 03/24/23  9:35 AM   Specimen: PATH Amputaion Arm/Leg; Tissue  Result Value Ref Range Status   Specimen Description TISSUE  Final   Special Requests NONE  Final   Gram Stain NO WBC SEEN NO ORGANISMS SEEN   Final   Culture   Final    NO GROWTH 2 DAYS NO ANAEROBES ISOLATED; CULTURE IN PROGRESS FOR 5 DAYS Performed at [facility], [address] [place], [place] [address]    Report Status PENDING  Incomplete    Rexene Alberts, MSN, NP-C [facility] Pager: [phone]  03/26/2023 3:12 PM

## 2023-03-26 NOTE — Progress Notes (Signed)
 History and Physical Interval Note:  03/26/2023 11:19 AM  [name]  has presented today for surgery, with the diagnosis of open surgical site s/p I&D and 3rd met head resection.  The various methods of treatment have been discussed with the patient and family. After consideration of risks, benefits and other options for treatment, the patient has consented to   Procedure(s) with comments: IRRIGATION AND DEBRIDEMENT LEFT FOOT WITH DELAYED CLOSURE (Left) - Left 3rd met head resection as a surgical intervention.  The patient's history has been reviewed, patient examined, no change in status, stable for surgery.  I have reviewed the patient's chart and labs.  Questions were answered to the patient's satisfaction.     Jenelle Mages [name]

## 2023-03-26 NOTE — Progress Notes (Signed)
 Brief ID Note -   Went by to see patient in consult however off the floor in OR for repeat wash out of the foot with podiatry. Margin sent to path was negative for osteomyelitis following resection of 3rd MTH. Absorbable antibiotic beads (vanc/tobra) were placed in wound.   Cultures have still not been updated at this point - he was on pre op Augmentin. Something is growing but not ready for read yet   Op Note from today indicated no purulence at the site. No evidence of residual infection. Wound 6 x 1 x 2 cm   Continue IV unasyn and daptomycin for now - will hopefully have culture update later today or tomorrow so we can coordinate plans.    Rexene Alberts, MSN, NP-C [facility]  [email or web address] Pager: [phone] Office: [phone] [facility] Main Line: [phone] *Secure Chat Communication Welcome

## 2023-03-26 NOTE — Progress Notes (Signed)
   03/26/23 2100  Spiritual Encounters  Type of Visit Initial  Care provided to: Patient  Referral source Nurse (RN/NT/LPN)  Reason for visit Routine spiritual support  OnCall Visit Yes  Spiritual Framework  Presenting Themes Meaning/purpose/sources of inspiration;Goals in life/care;Values and beliefs;Significant life change;Impactful experiences and emotions;Courage hope and growth;Rituals and practive  Values/beliefs Strong faith in Christianity  Community/Connection Family  Strengths Optimism grounded in strong faith in God  Needs/Challenges/Barriers Job concerns with extended time off, chronic back pain issues  Patient Stress Factors Major life changes;Family relationships;Health changes;Financial concerns  Family Stress Factors Health changes;Family relationships  Goals  Self/Personal Goals Get back to work asap, see his son succeed in his job and future  Interventions  Spiritual Care Interventions Made Established relationship of care and support;Compassionate presence;Reflective listening;Narrative/life review;Explored values/beliefs/practices/strengths;Meaning making;Prayer;Encouragement  Intervention Outcomes  Outcomes Reduced isolation;Awareness of support;Connection to spiritual care;Awareness around self/spiritual resourses;Connection to values and goals of care;Reduced anxiety;Other (comment)    Chaplain responded to page requesting a Bible. Chaplain delivered Bible, and pt open to a great deal of narrative/story sharing. Provided reflective listening, compassionate presence and prayer.  Pt demonstrates an impressive resilience, faith and optimism.

## 2023-03-26 NOTE — Progress Notes (Signed)
 PROGRESS NOTE    [name]  ZOX:[id number] DOB: Nov 12, 1965 DOA: 03/23/2023 PCP: [name], MD   No chief complaint on file.   Brief Narrative:  58 y.o. male with medical history significant of hypertension, ulcer left foot, osteomyelitis s/p left third toe resection in 2023, peripheral neuropathy, chronic pain who presents for presents with a wound on the top of his foot. He was referred by Dr. Lilian Kapur for evaluation of a foot infection.    Assessment & Plan:   Principal Problem:   Osteomyelitis (HCC) Active Problems:   Cellulitis of left foot   Hypertension   Lymphedema   Peripheral neuropathy   Nondependent opioid abuse (HCC)   Constipation  #1 osteomyelitis of the left foot/cellulitis of the left foot -Patient presented with findings concerning for cellulitis with erythema, edema, wound on the dorsal aspect of the left foot near the previous amputation site for osteomyelitis back in 2023. -Patient status post Augmentin in the outpatient setting as x-ray imaging of the left foot was significant for distraction of the portion of the head of the third metatarsal with bone fragment present in this region concerning for osteomyelitis. -Patient underwent MRI of the left foot which was concerning for osteomyelitis of the head and distal metadiaphysis of the third metatarsal, osteomyelitis of distal phalanx of the great toe, abscess noted primarily extending dorsal but also plantar to the eroded third metatarsal head, with abnormal fluid signal tracking to ulceration along the plantar foot.  Dorsal component of abscess 19 cc in volume. -Patient seen in consultation by podiatry and patient underwent incision and drainage with resection third metatarsal head of the left foot with dissolvable antibiotic bead application of the left foot. -Cultures drawn surgically with no growth to date. -Surgical pathology consistent with acute inflammation. -Patient to go back to the OR  today, 03/26/2023, for repeat washout and delayed primary closure. -Cultures pending. -Podiatry recommending holding anticoagulation until after next procedure. -ID consulted and formal consultation to be done postoperatively today, 03/26/2023. -ID recommended starting patient on daptomycin and Unasyn pending culture results. -Continue IV antibiotics. -Per podiatry.  2.  Hypertension -Stable.   3.  Lymphedema -Chronic in nature. -Patient noted to have a lymphedema of right lower extremity. -Continue home regimen furosemide.  4.  Peripheral neuropathy -Continue gabapentin.  5.  History of opioid abuse -Continue Suboxone.  6.??  Constipation -Patient started on bowel regimen of MiraLAX daily as well as Senokot-S twice daily with good results.   -Continue current bowel regimen.    DVT prophylaxis: SCDs.  Postop DVT prophylaxis per podiatry. Code Status: Full Family Communication: Updated patient.  No family at bedside. Disposition: TBD  Status is: Inpatient Remains inpatient appropriate because: Severity of illness   Consultants:  Podiatry: Dr. Annamary Rummage 03/23/2023  Procedures:  Plain films of the left foot 03/24/2023, 03/23/2023 MRI left foot 03/23/2023 Incision and drainage with resection third metatarsal head, left foot/dissolvable antibiotic bead application left foot per podiatry: Dr. Annamary Rummage 03/24/2023  Antimicrobials:  Anti-infectives (From admission, onward)    Start     Dose/Rate Route Frequency Ordered Stop   03/25/23 1515  Ampicillin-Sulbactam (UNASYN) 3 g in sodium chloride 0.9 % 100 mL IVPB        3 g 200 mL/hr over 30 Minutes Intravenous Every 6 hours 03/25/23 1429     03/25/23 1515  DAPTOmycin (CUBICIN) IVPB 700 mg/162mL premix        700 mg 200 mL/hr over 30 Minutes Intravenous Daily 03/25/23 1429  03/24/23 1004  vancomycin (VANCOCIN) powder  Status:  Discontinued          As needed 03/24/23 1004 03/24/23 1012   03/24/23 0945  tobramycin (NEBCIN)  injection  Status:  Discontinued          As needed 03/24/23 1003 03/24/23 1012   03/24/23 0930  vancomycin (VANCOREADY) IVPB 1500 mg/300 mL        1,500 mg 150 mL/hr over 120 Minutes Intravenous  Once 03/24/23 0920 03/24/23 1128   03/24/23 0903  ceFAZolin (ANCEF) 2-4 GM/100ML-% IVPB       Note to Pharmacy: Susy Manor L: cabinet override      03/24/23 0903 03/24/23 2114         Subjective: Sitting up in bed.  Denies any chest pain or shortness of breath.  No abdominal pain.  Stated had bowel movement yesterday after being started on bowel regimen.  Awaiting to go to surgery this morning.   Objective: Vitals:   03/25/23 1416 03/25/23 2019 03/26/23 0355 03/26/23 0923  BP: 126/78 116/72 119/74 124/78  Pulse: 63 68 (!) 58 70  Resp: 18 18 18 18   Temp: 99.2 F (37.3 C) 99 F (37.2 C) 98 F (36.7 C) 98.4 F (36.9 C)  TempSrc: Oral Oral Oral Oral  SpO2: 95% 96% 97% 100%  Weight:        Intake/Output Summary (Last 24 hours) at 03/26/2023 0952 Last data filed at 03/26/2023 1610 Gross per 24 hour  Intake 727 ml  Output 1200 ml  Net -473 ml   Filed Weights   03/23/23 1121  Weight: 106 kg    Examination:  General exam: NAD. Respiratory system: Lungs clear to auscultation bilaterally.  No wheezes, no crackles, no rhonchi.  Fair air movement.  Speaking in full sentences.   Cardiovascular system: Regular rate rhythm no murmurs rubs or gallops.  No JVD.  No lower extremity edema.   Gastrointestinal system: Abdomen soft, nontender, nondistended, positive bowel sounds.  No rebound.  No guarding.  Central nervous system: Alert and oriented. No focal neurological deficits. Extremities: Left foot wrapped in postop bandage.  Skin: No rashes, lesions or ulcers Psychiatry: Judgement and insight appear normal. Mood & affect appropriate.     Data Reviewed: I have personally reviewed following labs and imaging studies  CBC: Recent Labs  Lab 03/23/23 1130 03/24/23 0625  03/25/23 0615 03/26/23 0558  WBC 6.2 6.4 8.3 9.2  NEUTROABS 4.3  --   --  5.4  HGB 12.8* 12.2* 12.7* 14.3  HCT 37.7* 35.5* 36.8* 41.5  MCV 89.8 89.2 89.5 88.9  PLT 264 256 274 303    Basic Metabolic Panel: Recent Labs  Lab 03/23/23 1130 03/24/23 0625 03/25/23 0615 03/26/23 0558  NA 137 136 137 138  K 3.9 4.1 4.2 4.6  CL 105 106 103 101  CO2 21* 24 26 27   GLUCOSE 143* 108* 104* 100*  BUN 18 15 16 17   CREATININE 0.86 0.90 0.85 0.89  CALCIUM 9.3 8.7* 9.0 9.4  PHOS  --   --   --  4.1    GFR: Estimated Creatinine Clearance: 117.7 mL/min (by C-G formula based on SCr of 0.89 mg/dL).  Liver Function Tests: Recent Labs  Lab 03/23/23 1130 03/25/23 0615 03/26/23 0558  AST 24 16  --   ALT 24 19  --   ALKPHOS 75 71  --   BILITOT 0.8 0.6  --   PROT 7.0 6.5  --   ALBUMIN 3.4* 3.1*  3.3*    CBG: No results for input(s): "GLUCAP" in the last 168 hours.   Recent Results (from the past 240 hours)  Culture, blood (single) w Reflex to ID Panel     Status: None (Preliminary result)   Collection Time: 03/23/23  7:48 PM   Specimen: BLOOD LEFT ARM  Result Value Ref Range Status   Specimen Description BLOOD LEFT ARM  Final   Special Requests   Final    BOTTLES DRAWN AEROBIC AND ANAEROBIC Blood Culture results may not be optimal due to an inadequate volume of blood received in culture bottles   Culture   Final    NO GROWTH 3 DAYS Performed at [facility], [address], [place], [place] [address]    Report Status PENDING  Incomplete  Aerobic/Anaerobic Culture w Gram Stain (surgical/deep wound)     Status: None (Preliminary result)   Collection Time: 03/24/23  9:34 AM   Specimen: PATH Amputaion Arm/Leg; Tissue  Result Value Ref Range Status   Specimen Description TISSUE  Final   Special Requests SOURCE A  Final   Gram Stain NO WBC SEEN NO ORGANISMS SEEN   Final   Culture   Final    CULTURE REINCUBATED FOR BETTER GROWTH Performed at [facility], [address] [place], [place] [address]    Report Status PENDING  Incomplete  Aerobic/Anaerobic Culture w Gram Stain (surgical/deep wound)     Status: None (Preliminary result)   Collection Time: 03/24/23  9:35 AM   Specimen: PATH Amputaion Arm/Leg; Tissue  Result Value Ref Range Status   Specimen Description TISSUE  Final   Special Requests NONE  Final   Gram Stain NO WBC SEEN NO ORGANISMS SEEN   Final   Culture   Final    NO GROWTH < 24 HOURS Performed at [facility], [address], [place], [place] [address]    Report Status PENDING  Incomplete         Radiology Studies: DG Foot 2 Views Left Result Date: 03/24/2023 CLINICAL DATA:  Status post third metatarsal amputation for osteomyelitis. EXAM: LEFT FOOT - 2 VIEW COMPARISON:  Radiograph dated 03/23/2023. FINDINGS: Status post amputation of the mid third metatarsal. No acute fracture or dislocation. The bones are osteopenic. Postsurgical changes of the foot with soft tissue swelling. Antibiotic beads noted. IMPRESSION: Status post amputation of the mid third metatarsal. Electronically Signed   By: Elgie Collard M.D.   On: 03/24/2023 14:07        Scheduled Meds:  buprenorphine-naloxone  0.5 tablet Sublingual TID   chlorhexidine  15 mL Mouth/Throat Once   Or   mouth rinse  15 mL Mouth Rinse Once   furosemide  40 mg Oral q morning   gabapentin  100 mg Oral QHS   polyethylene glycol  17 g Oral BID   potassium chloride SA  20 mEq Oral Daily   senna-docusate  1 tablet Oral BID   sodium chloride flush  3 mL Intravenous Q12H   Continuous Infusions:  ampicillin-sulbactam (UNASYN) IV 3 g (03/26/23 4010)   DAPTOmycin Stopped (03/25/23 2018)     LOS: 3 days    Time spent: 35 minutes    Ramiro Harvest, MD [facility]   To contact the attending provider between 7A-7P or the covering provider during after hours 7P-7A, please log into the web site [email or web address] and access using universal [place] password for  that web site. If you do not  have the password, please call the hospital operator.  03/26/2023, 9:52 AM

## 2023-03-26 NOTE — Op Note (Signed)
 Full Operative Report  Date of Operation: 12:09 PM, 03/26/2023   [name] - 58 y.o. male  Surgeon: Pilar Plate, DPM   Assistant: None  Diagnosis: Osteomyelitis of left foot  Procedure:  1.  Repeat irrigation debridement of surgical site with prep for graft, 6 x 1 x 2 cm, left foot 2.  Application antibiotic dissolvable calcium sulfate beads with vancomycin and tobramycin, left foot 3.  Application of acellular dermal graft micronized Kerecis 38 cm, left foot 4.  Delayed primary closure of surgical site, 6 x 1 x 2 cm, left foot     Anesthesia: Monitor Anesthesia Care  Achille Rich, MD  Anesthesiologist: Achille Rich, MD CRNA: Allyn Kenner, CRNA   Estimated Blood Loss: Minimal   Hemostasis: 1) Anatomical dissection, mechanical compression, electrocautery 2) no tourniquet was used during procedure  Implants: Implant Name Type Inv. Item Serial No. Manufacturer Lot No. LRB No. Used Action  GRAFT SKIN WND MICRO 38 - [id number] Tissue GRAFT SKIN WND MICRO 38  [employer] [id number] Left 1 Implanted  KIT STIMULAN RAPID CURE 10CC - [id number] Orthopedic Implant KIT STIMULAN RAPID CURE 10CC  [employer] [id number] Left 1 Implanted    Materials: 3-0 Prolene  Injectables: 1) Pre-operatively: 10 cc of 50:50 mixture 1%lidocaine plain and 0.5% marcaine plain 2) Post-operatively: None   Specimens: Pathology: None microbiology: None   Antibiotics: IV antibiotics given per schedule on the floor  Drains: None  Complications: Patient tolerated the procedure well without complication.   Operative findings: As below in detailed report  Indications for Procedure: [name] presents to [place], [place], [place] with a chief complaint of open surgical site on left foot status post prior incision and drainage with partial resection of third metatarsal which was left open to drain with planned return to the OR today.  The patient has  failed conservative treatments of various modalities. At this time the patient has elected to proceed with surgical correction. All alternatives, risks, and complications of the procedures were thoroughly explained to the patient. Patient exhibits appropriate understanding of all discussion points and informed consent was signed and obtained in the chart with no guarantees to surgical outcome given or implied.  Description of Procedure: Patient was brought to the operating room. Patient remained on their hospital bed in the supine position. A surgical timeout was performed and all members of the operating room, the procedure, and the surgical site were identified. anesthesia occurred as per anesthesia record. Local anesthetic as previously described was then injected about the operative field in a local infiltrative block.  The operative lower extremity as noted above was then prepped and draped in the usual sterile manner. The following procedure then began.  Attention was directed to the open wound on the left foot.  Prior packing material was removed.  There was no residual purulence noted from the surgical site.  No evidence of residual infection.  The wound measured 6 x 1 x 2 cm predebridement.  The wound was then prepped for graft with irrigation and excisional debridement to the level of bone.  Debridement was carried out with rongeur curette with removal of all necrotic and fibrotic tissues as well as old antibiotic beads from within the surgical cavity.  Next the surgical site was thoroughly irrigated with 3 L of sterile saline via cystoscopy tubing.   Next antibiotic beads including stimulant dissolvable calcium sulfate beads were prepared on the back table.  The beads were mixed with vancomycin powder  and tobramycin liquid.  Small beads were made.  These were then implanted deep within the surgical cavity.  Next given excess dead space and at risk surgical site decision was made to apply  acellular dermal graft to increase healing postoperatively.  Next Kerecis micronized acellular dermal graft 30 cm was implanted within the surgical site.  Next the surgical site was measured and found to be 6 x 1 x 2 cm postdebridement.  1.2 cm postdebridement.  The tissue margins medial and lateral were brought together and well opposed with minimal tension.  The surgical site was then closed with 3-0 Prolene in interrupted simple fashion with minimal tension.  The surgical site was then dressed with Xeroform 4 x 4 Kerlix Ace wrap. The patient tolerated both the procedure and anesthesia well with vital signs stable throughout. The patient was transferred in good condition and all vital signs stable  from the OR to recovery under the discretion of anesthesia.  Condition: Vital signs stable, neurovascular status unchanged from preoperative   Surgical plan:  Clean surgical site at conclusion of procedure no evidence of residual infection.  Closed primarily at this time.   weightbearing to left foot in postop shoe for transfers and short distances only otherwise elevate foot and stay off is much as he is able.  Will be stable for discharge home tomorrow.  Leave dressing clean dry and intact until follow-up in office in 1 week.  The patient will be bearing as tolerated for short distances and transfers in a postop shoe to the operative limb until further instructed. The dressing is to remain clean, dry, and intact. Will continue to follow unless noted elsewhere.   Carlena Hurl, DPM [facility]

## 2023-03-26 NOTE — Transfer of Care (Signed)
 Immediate Anesthesia Transfer of Care Note  [name]  Procedure(s) Performed: IRRIGATION AND DEBRIDEMENT LEFT FOOT WITH GRAFT AND ANTIBIOTIC BEADS WITH DELAYED CLOSURE (Left: Foot)  Patient Location: PACU  Anesthesia Type:MAC  Level of Consciousness: awake, alert , and oriented  Airway & Oxygen Therapy: Patient Spontanous Breathing and Patient connected to face mask oxygen  Post-op Assessment: Report given to RN and Post -op Vital signs reviewed and stable  Post vital signs: Reviewed and stable  Last Vitals:  Vitals Value Taken Time  BP 111/63 03/26/23 1215  Temp    Pulse 62 03/26/23 1215  Resp 18 03/26/23 1215  SpO2 94 % 03/26/23 1215  Vitals shown include unfiled device data.  Last Pain:  Vitals:   03/26/23 1048  TempSrc: Oral  PainSc: 5          Complications: No notable events documented.  Patient transported to PACU 3 on room air, alert and oriented, VSS, temp 98, denies pain

## 2023-03-26 NOTE — Progress Notes (Signed)
 Patient off floor to OR

## 2023-03-26 NOTE — Anesthesia Preprocedure Evaluation (Signed)
 Anesthesia Evaluation  Patient identified by MRN, date of birth, ID band Patient awake    Reviewed: Allergy & Precautions, H&P , NPO status , Patient's Chart, lab work & pertinent test results  Airway Mallampati: II  TM Distance: >3 FB Neck ROM: Full    Dental  (+) Poor Dentition, Missing   Pulmonary sleep apnea , Patient abstained from smoking., former smoker   Pulmonary exam normal        Cardiovascular hypertension,  Rhythm:Regular Rate:Normal     Neuro/Psych negative neurological ROS  negative psych ROS   GI/Hepatic negative GI ROS, Neg liver ROS,,,  Endo/Other  negative endocrine ROS    Renal/GU negative Renal ROS  negative genitourinary   Musculoskeletal  (+) Arthritis , Osteoarthritis,  Osteomyelitis left toe    Abdominal Normal abdominal exam  (+)   Peds  Hematology Lab Results      Component                Value               Date                      WBC                      6.4                 03/24/2023                HGB                      12.2 (L)            03/24/2023                HCT                      35.5 (L)            03/24/2023                MCV                      89.2                03/24/2023                PLT                      256                 03/24/2023              Anesthesia Other Findings   Reproductive/Obstetrics                             Anesthesia Physical Anesthesia Plan  ASA: 3  Anesthesia Plan: MAC   Post-op Pain Management:    Induction: Intravenous  PONV Risk Score and Plan: Ondansetron, Dexamethasone, Propofol infusion, Treatment may vary due to age or medical condition and Midazolam  Airway Management Planned: Simple Face Mask and Nasal Cannula  Additional Equipment: None  Intra-op Plan:   Post-operative Plan:   Informed Consent: I have reviewed the patients History and Physical, chart, labs and discussed the procedure  including the risks, benefits and alternatives for the proposed anesthesia with the patient  or authorized representative who has indicated his/her understanding and acceptance.     Dental advisory given  Plan Discussed with: CRNA and Anesthesiologist  Anesthesia Plan Comments:        Anesthesia Quick Evaluation

## 2023-03-27 ENCOUNTER — Other Ambulatory Visit (HOSPITAL_COMMUNITY): Payer: Self-pay

## 2023-03-27 ENCOUNTER — Encounter (HOSPITAL_COMMUNITY): Payer: Self-pay | Admitting: Podiatry

## 2023-03-27 DIAGNOSIS — M869 Osteomyelitis, unspecified: Secondary | ICD-10-CM | POA: Diagnosis not present

## 2023-03-27 DIAGNOSIS — L03116 Cellulitis of left lower limb: Secondary | ICD-10-CM | POA: Diagnosis not present

## 2023-03-27 DIAGNOSIS — K5900 Constipation, unspecified: Secondary | ICD-10-CM | POA: Diagnosis not present

## 2023-03-27 DIAGNOSIS — I10 Essential (primary) hypertension: Secondary | ICD-10-CM | POA: Diagnosis not present

## 2023-03-27 DIAGNOSIS — B957 Other staphylococcus as the cause of diseases classified elsewhere: Secondary | ICD-10-CM

## 2023-03-27 DIAGNOSIS — M86172 Other acute osteomyelitis, left ankle and foot: Secondary | ICD-10-CM | POA: Diagnosis not present

## 2023-03-27 LAB — BASIC METABOLIC PANEL
Anion gap: 6 (ref 5–15)
BUN: 18 mg/dL (ref 6–20)
CO2: 29 mmol/L (ref 22–32)
Calcium: 9 mg/dL (ref 8.9–10.3)
Chloride: 100 mmol/L (ref 98–111)
Creatinine, Ser: 0.82 mg/dL (ref 0.61–1.24)
GFR, Estimated: >60 mL/min (ref >60)
Glucose, Bld: 102 mg/dL — ABNORMAL HIGH (ref 70–99)
Potassium: 4.1 mmol/L (ref 3.5–5.1)
Sodium: 135 mmol/L (ref 135–145)

## 2023-03-27 LAB — CBC
HCT: 40.5 % (ref 39.0–52.0)
Hemoglobin: 13.9 g/dL (ref 13.0–17.0)
MCH: 30.5 pg (ref 26.0–34.0)
MCHC: 34.3 g/dL (ref 30.0–36.0)
MCV: 88.8 fL (ref 80.0–100.0)
Platelets: 318 10*3/uL (ref 150–400)
RBC: 4.56 MIL/uL (ref 4.22–5.81)
RDW: 11.8 % (ref 11.5–15.5)
WBC: 10.4 10*3/uL (ref 4.0–10.5)
nRBC: 0 % (ref 0.0–0.2)

## 2023-03-27 LAB — SEDIMENTATION RATE: Sed Rate: 39 mm/h — ABNORMAL HIGH (ref 0–16)

## 2023-03-27 LAB — C-REACTIVE PROTEIN: CRP: 2.2 mg/dL — ABNORMAL HIGH (ref <1.0)

## 2023-03-27 MED ORDER — POLYETHYLENE GLYCOL 3350 17 GM/SCOOP PO POWD
17.0000 g | Freq: Two times a day (BID) | ORAL | 0 refills | Status: AC
  Filled 2023-03-27: qty 238, 7d supply, fill #0

## 2023-03-27 MED ORDER — CEFADROXIL 500 MG PO CAPS
1000.0000 mg | ORAL_CAPSULE | Freq: Two times a day (BID) | ORAL | Status: DC
  Administered 2023-03-27: 1000 mg via ORAL
  Filled 2023-03-27: qty 2

## 2023-03-27 MED ORDER — CEFADROXIL 500 MG PO CAPS
1000.0000 mg | ORAL_CAPSULE | Freq: Two times a day (BID) | ORAL | 0 refills | Status: AC
Start: 2023-03-27 — End: 2023-04-24
  Filled 2023-03-27: qty 112, 28d supply, fill #0

## 2023-03-27 MED ORDER — SENNOSIDES-DOCUSATE SODIUM 8.6-50 MG PO TABS
1.0000 | ORAL_TABLET | Freq: Two times a day (BID) | ORAL | 0 refills | Status: AC
  Filled 2023-03-27: qty 20, 10d supply, fill #0

## 2023-03-27 MED ORDER — ONDANSETRON HCL 4 MG PO TABS
4.0000 mg | ORAL_TABLET | Freq: Four times a day (QID) | ORAL | 0 refills | Status: AC | PRN
Start: 2023-03-27 — End: ?
  Filled 2023-03-27: qty 20, 5d supply, fill #0

## 2023-03-27 MED ORDER — ACETAMINOPHEN 325 MG PO TABS: 650.0000 mg | ORAL_TABLET | Freq: Four times a day (QID) | ORAL | Status: AC | PRN

## 2023-03-27 NOTE — Progress Notes (Signed)
 [facility]  Date of Admission:  03/23/2023        ASSESSMENT: 58 yo male with left foot diabetic om  S/p I&D with abx beads placment; cx grew staph lugdenensis. He was on agumentin long course prior to admission  2/25 operative cx staph lug (s tetra, bactrim, oxacillin)  Reviewed operative note --> s/p 3rd metatarsal head resection; 2nd debridement needed and wound open for secondary closure  Mri did suggest 1. Osteomyelitis of the head and distal metadiaphysis of the third metatarsal. 2. Osteomyelitis of the distal phalanx of the great toe. 3. Substantial abscess primarily extending dorsal but also plantar to the eroded third metatarsal head, with abnormal fluid signal tracking to ulceration along the plantar foot. Cannot exclude plantar draining sinus tract. Dorsal component of abscess 19 cc in volume. 4. Abnormal lateral erosion of the head of the first metatarsal with underlying marrow edema, possibly from a degenerative lesion and less likely to be from infection. 5. Second digit hammertoe deformity with hyper extension and dorsal subluxation at the distal interphalangeal joint. 6. 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  PLAN: 4 weeks of cefadroxil 1000 mg po bid to be given -- abx provision/education Will f/u id clinic Id will sign off Discussed with podiatry/primary team  Principal Problem:   Osteomyelitis (HCC) Active Problems:   Hypertension   Lymphedema   Nondependent opioid abuse (HCC)   Peripheral neuropathy   Cellulitis of left foot   Constipation   No Known Allergies  Scheduled Meds:  buprenorphine-naloxone  0.5 tablet Sublingual TID   cefadroxil  1,000 mg Oral BID   furosemide  40 mg Oral q morning   gabapentin  100 mg Oral QHS   polyethylene glycol  17 g Oral BID   potassium chloride SA  20 mEq Oral Daily   senna-docusate  1 tablet Oral BID   sodium chloride flush  3 mL Intravenous Q12H    Continuous Infusions: PRN Meds:.acetaminophen, albuterol, hydrALAZINE, nicotine polacrilex, ondansetron **OR** ondansetron (ZOFRAN) IV, oxyCODONE-acetaminophen   SUBJECTIVE: Cx staph lugdunensis Afebrile No abx side effect  Review of Systems: ROS All other ROS was negative, except mentioned above     OBJECTIVE: Vitals:   03/26/23 1300 03/26/23 1945 03/27/23 0414 03/27/23 0938  BP: 115/69 121/79 111/68 122/87  Pulse: 61 72 64 80  Resp: 20 18 19 20   Temp: 97.6 F (36.4 C) 98.3 F (36.8 C) 97.9 F (36.6 C) 98.7 F (37.1 C)  TempSrc: Oral   Oral  SpO2: 99% 96% 97% 97%  Weight:      Height:       Body mass index is 27.25 kg/m.  Physical Exam General/constitutional: no distress, pleasant HEENT: Normocephalic, PER, Conj Clear, EOMI, Oropharynx clear Neck supple CV: rrr no mrg Lungs: clear to auscultation, normal respiratory effort Abd: Soft, Nontender Ext: no edema Skin: No Rash Neuro: nonfocal MSK: left foot dressing c/d/i  Lab Results Lab Results  Component Value Date   WBC 10.4 03/27/2023   HGB 13.9 03/27/2023   HCT 40.5 03/27/2023   MCV 88.8 03/27/2023   PLT 318 03/27/2023    Lab Results  Component Value Date   CREATININE 0.82 03/27/2023   BUN 18 03/27/2023   NA 135 03/27/2023   K 4.1 03/27/2023   CL 100 03/27/2023   CO2 29 03/27/2023    Lab Results  Component Value Date   ALT 19 03/25/2023   AST 16 03/25/2023  ALKPHOS 71 03/25/2023   BILITOT 0.6 03/25/2023      Microbiology: Recent Results (from the past 240 hours)  Culture, blood (single) w Reflex to ID Panel     Status: None (Preliminary result)   Collection Time: 03/23/23  7:48 PM   Specimen: BLOOD LEFT ARM  Result Value Ref Range Status   Specimen Description BLOOD LEFT ARM  Final   Special Requests   Final    BOTTLES DRAWN AEROBIC AND ANAEROBIC Blood Culture results may not be optimal due to an inadequate volume of blood received in culture bottles   Culture   Final    NO  GROWTH 4 DAYS Performed at [facility], [address], [place], [place] [address]    Report Status PENDING  Incomplete  Aerobic/Anaerobic Culture w Gram Stain (surgical/deep wound)     Status: None (Preliminary result)   Collection Time: 03/24/23  9:34 AM   Specimen: PATH Amputaion Arm/Leg; Tissue  Result Value Ref Range Status   Specimen Description TISSUE  Final   Special Requests SOURCE A  Final   Gram Stain   Final    NO WBC SEEN NO ORGANISMS SEEN Performed at [facility], [address] [place], [place] [address]    Culture   Final    FEW STAPHYLOCOCCUS LUGDUNENSIS NO ANAEROBES ISOLATED; CULTURE IN PROGRESS FOR 5 DAYS    Report Status PENDING  Incomplete   Organism ID, Bacteria STAPHYLOCOCCUS LUGDUNENSIS  Final      Susceptibility   Staphylococcus lugdunensis - MIC*    CIPROFLOXACIN 2 INTERMEDIATE Intermediate     ERYTHROMYCIN <=0.25 SENSITIVE Sensitive     GENTAMICIN <=0.5 SENSITIVE Sensitive     OXACILLIN <=0.25 SENSITIVE Sensitive     TETRACYCLINE <=1 SENSITIVE Sensitive     VANCOMYCIN 1 SENSITIVE Sensitive     TRIMETH/SULFA <=10 SENSITIVE Sensitive     CLINDAMYCIN <=0.25 SENSITIVE Sensitive     RIFAMPIN <=0.5 SENSITIVE Sensitive     Inducible Clindamycin NEGATIVE Sensitive     * FEW STAPHYLOCOCCUS LUGDUNENSIS  Aerobic/Anaerobic Culture w Gram Stain (surgical/deep wound)     Status: None (Preliminary result)   Collection Time: 03/24/23  9:35 AM   Specimen: PATH Amputaion Arm/Leg; Tissue  Result Value Ref Range Status   Specimen Description TISSUE  Final   Special Requests NONE  Final   Gram Stain NO WBC SEEN NO ORGANISMS SEEN   Final   Culture   Final    NO GROWTH 3 DAYS NO ANAEROBES ISOLATED; CULTURE IN PROGRESS FOR 5 DAYS Performed at [facility], [address] [place], [place] [address]    Report Status PENDING  Incomplete     Serology:   Imaging: If present, new imagings (plain films, ct scans, and mri) have been personally  visualized and interpreted; radiology reports have been reviewed. Decision making incorporated into the Impression / Recommendations.   Raymondo Band, MD [facility] [phone] pager    03/27/2023, 4:01 PM

## 2023-03-27 NOTE — Plan of Care (Signed)
  Problem: Education: Goal: Knowledge of General Education information will improve Description: Including pain rating scale, medication(s)/side effects and non-pharmacologic comfort measures Outcome: Progressing   Problem: Nutrition: Goal: Adequate nutrition will be maintained Outcome: Progressing   Problem: Pain Managment: Goal: General experience of comfort will improve and/or be controlled Outcome: Progressing   Problem: Safety: Goal: Ability to remain free from injury will improve Outcome: Progressing   Problem: Skin Integrity: Goal: Risk for impaired skin integrity will decrease Outcome: Progressing

## 2023-03-27 NOTE — Progress Notes (Addendum)
  Subjective:  Patient ID: [name], male    DOB: 04-Jun-1965,  MRN: [id number]   DOS: 03/26/2023 Procedure: 1.  Repeat irrigation debridement of surgical site with prep for graft, 6 x 1 x 2 cm, left foot 2.  Application antibiotic dissolvable calcium sulfate beads with vancomycin and tobramycin, left foot 3.  Application of acellular dermal graft micronized Kerecis 38 cm, left foot 4.  Delayed primary closure of surgical site, 6 x 1 x 2 cm, left foot    58 y.o. male seen for post op check.  Patient reports he has 4-5 out of 10 pain occasionally but it is controlled very well with Tylenol once he takes that.  All questions were answered regards to follow-up and plan going forward.  He is aware that of follow-up next week.  Review of Systems: Negative except as noted in the HPI. Denies N/V/F/Ch.   Objective:   Vitals:   03/26/23 1945 03/27/23 0414  BP: 121/79 111/68  Pulse: 72 64  Resp: 18 19  Temp: 98.3 F (36.8 C) 97.9 F (36.6 C)  SpO2: 96% 97%   Body mass index is 27.25 kg/m. Constitutional Well developed. Well nourished.  Vascular Foot warm and well perfused. Capillary refill normal to all digits.   No calf pain with palpation  Neurologic Normal speech. Oriented to person, place, and time. Epicritic sensation diminished  Dermatologic Dressing clean dry and intact  Orthopedic: No pain on palpation   Radiographs: Status post amputation of the mid third metatarsal  Pathology: Bone left third metatarsal fragments of bone and fibroconnective tissue with acute information  Micro: Staph lugdunensis sensitive to clindamycin  Assessment:   1. Osteomyelitis of left foot, unspecified type [facility])    Plan:  Patient was evaluated and treated and all questions answered.  POD # 1 s/p repeat debridement antibiotic bead and graft application with delayed primary closure -Progressing very well postoperatively has minimal pain after taking Tylenol controlled. -No need for  home health for wound care as the surgical site is closed and I will be forming dressing changes in the office -No evidence of need for long-term IV antibiotics -Culture with staph lugdunensis recommend Abx per ID - plan for cefadroxil PO on DC -XR: Expected postop changes clean resection margin third metatarsal -WB Status: Weightbearing as tolerated in postop shoe for short distances and transfers only -Sutures: Remain intact 2 to 3 weeks. -Medications/ABX: Per ID recs  -Dressing to remain clean dry and intact until follow-up in 1 week next week Thursday will have office call to schedule -Will sign off this time please call with any questions or concerns        Corinna Gab, DPM [facility]

## 2023-03-27 NOTE — Discharge Summary (Signed)
 Physician Discharge Summary  SIRR KABEL ZOX:[id number] DOB: [birth year] DOA: 03/23/2023  PCP: [name], MD  Admit date: 03/23/2023 Discharge date: 03/27/2023  Time spent: 60 minutes  Recommendations for Outpatient Follow-up:  Follow-up with Dr. Annamary Rummage in 1 week. Follow-up with Rexene Alberts, NP, ID on 04/10/2023. Follow-up with [name], MD in 3 to 4 weeks.  On follow-up patient will need a basic metabolic profile done to follow-up on electrolytes and renal function.   Discharge Diagnoses:  Principal Problem:   Osteomyelitis (HCC) Active Problems:   Cellulitis of left foot   Hypertension   Lymphedema   Peripheral neuropathy   Nondependent opioid abuse (HCC)   Constipation   Discharge Condition: Stable and improved.  Diet recommendation: Heart healthy  Filed Weights   03/23/23 1121 03/26/23 1048  Weight: 106 kg 98.9 kg    History of present illness:  HPI per Dr. Mauro Kaufmann ZACKORY PUDLO is a 58 y.o. male with medical history significant of hypertension, ulcer left foot, osteomyelitis s/p left third toe resection in 2023, peripheral neuropathy, chronic pain who presents for presents with a wound on the top of his foot. He was referred by Dr. Lilian Kapur for evaluation of a foot infection.   He noted swelling and redness develop about 4 days ago, with a noticeable bump appearing.  He had notified his podiatrist of the symptoms and was started on Augmentin at that time. Initially, he experienced chills and possibly a slight fever, but these symptoms subsided after starting antibiotics.  Denies having any  shortness of breath or cough. Despite attempts to manage the wound at home and it progressively worsened and started draining.   He has a history of lymphedema in the right leg, which he believes contributes to the swelling and redness of the foot. He has been trying to manage the condition by keeping off the foot, elevating it, and applying ice.   He  has a history of vaping, and does not consume alcohol.   On admission to the emergency department patient was noted to be afebrile with blood pressures elevated up to 151/71, and all other labs relatively within normal limits.  Labs significant for hemoglobin 12.8 and glucose 143. X-rays of the foot noted destruction of the portion of the head at the third metatarsal with a small bone fragment in this region for which findings equivocal for osteomyelitis MRI of the left foot was ordered.  [facility].  It was recommended to hold antibiotics and keeping patient n.p.o. after midnight for need of surgical intervention in AM.  Hospital Course:  #1 osteomyelitis of the left foot/cellulitis of the left foot -Patient presented with findings concerning for cellulitis with erythema, edema, wound on the dorsal aspect of the left foot near the previous amputation site for osteomyelitis back in 2023. -Patient status post Augmentin in the outpatient setting as x-ray imaging of the left foot was significant for distraction of the portion of the head of the third metatarsal with bone fragment present in this region concerning for osteomyelitis. -Patient underwent MRI of the left foot which was concerning for osteomyelitis of the head and distal metadiaphysis of the third metatarsal, osteomyelitis of distal phalanx of the great toe, abscess noted primarily extending dorsal but also plantar to the eroded third metatarsal head, with abnormal fluid signal tracking to ulceration along the plantar foot.  Dorsal component of abscess 19 cc in volume. -Patient seen in consultation by podiatry and patient underwent incision and drainage  with resection third metatarsal head of the left foot with dissolvable antibiotic bead application of the left foot. -Cultures drawn surgically with Staphylococcus Lugdunensis.  -Surgical pathology consistent with acute inflammation. -Patient went back to the OR, 03/26/2023,  for repeat washout and delayed primary closure. -ID consulted and followed the patient and initially recommended patient be on IV daptomycin and IV Unasyn pending culture results.  -Once cultures were resulted ID recommended 4 weeks of cefadroxil 500 mg twice daily with outpatient follow-up on 04/10/2023.   -Patient be discharged in stable and improved condition with follow-up in the outpatient setting with podiatry 1 week postdischarge.   -Patient will be discharged in stable and improved condition.    2.  Hypertension -Stable.    3.  Lymphedema -Chronic in nature. -Patient noted to have a lymphedema of right lower extremity. -Patient maintained on home regimen furosemide.   4.  Peripheral neuropathy -Patient maintained on home regimen gabapentin.   5.  History of opioid abuse -Patient noted on home regimen Suboxone.   6.??  Constipation -Patient started on bowel regimen of MiraLAX daily as well as Senokot-S twice daily with good results.   -Patient will be discharged on bowel regimen.       Procedures: Plain films of the left foot 03/24/2023, 03/23/2023 MRI left foot 03/23/2023 Incision and drainage with resection third metatarsal head, left foot/dissolvable antibiotic bead application left foot per podiatry: Dr. Annamary Rummage 03/24/2023  Consultations: Podiatry: Dr. Annamary Rummage 03/23/2023    Discharge Exam: Vitals:   03/27/23 0414 03/27/23 0938  BP: 111/68 122/87  Pulse: 64 80  Resp: 19 20  Temp: 97.9 F (36.6 C) 98.7 F (37.1 C)  SpO2: 97% 97%    General: NAD Cardiovascular: RRR no m/r/g Respiratory: CTAB.  No wheezes, no crackles, no rhonchi.  Fair air movement.  Speaking in full sentences.  Discharge Instructions   Discharge Instructions     Diet - low sodium heart healthy   Complete by: As directed    Increase activity slowly   Complete by: As directed    No wound care   Complete by: As directed       Allergies as of 03/27/2023   No Known Allergies       Medication List     STOP taking these medications    amoxicillin-clavulanate 875-125 MG tablet Commonly known as: AUGMENTIN       TAKE these medications    acetaminophen 325 MG tablet Commonly known as: TYLENOL Take 2 tablets (650 mg total) by mouth every 6 (six) hours as needed for mild pain (pain score 1-3).   Buprenorphine HCl-Naloxone HCl 8-2 MG Film Place 0.5 Film under the tongue in the morning, at noon, in the evening, and at bedtime.   cefadroxil 500 MG capsule Commonly known as: DURICEF Take 2 capsules (1,000 mg total) by mouth 2 (two) times daily for 28 days.   cloNIDine 0.1 MG tablet Commonly known as: CATAPRES Take 0.1 mg by mouth daily.   furosemide 40 MG tablet Commonly known as: LASIX Take 40 mg by mouth every morning.   gabapentin 100 MG capsule Commonly known as: NEURONTIN Take 100 mg by mouth at bedtime.   Klor-Con M20 20 MEQ tablet Generic drug: potassium chloride SA Take 20 mEq by mouth daily.   naproxen 500 MG tablet Commonly known as: NAPROSYN Take 500 mg by mouth every 12 (twelve) hours as needed for moderate pain (pain score 4-6).   ondansetron 4 MG tablet Commonly known as:  ZOFRAN Take 1 tablet (4 mg total) by mouth every 6 (six) hours as needed for nausea.   polyethylene glycol powder 17 GM/SCOOP powder Commonly known as: GLYCOLAX/MIRALAX Take 1 capful (17 g) with water by mouth 2 (two) times daily.   senna-docusate 8.6-50 MG tablet Commonly known as: Senokot-S Take 1 tablet by mouth 2 (two) times daily.       No Known Allergies  Follow-up Information     Standiford, Jenelle Mages, DPM. Schedule an appointment as soon as possible for a visit in 1 week(s).   Specialty: Podiatry Contact information: [address] [place] [address] [phone]         [name], MD. Schedule an appointment as soon as possible for a visit in 4 week(s).   Specialty: Internal Medicine Why: f/u in 3-4  weeks. Contact information: [address] [place] [address] [phone]         Blanchard Kelch, NP Follow up on 04/10/2023.   Specialty: Infectious Diseases Why: Follow-up as scheduled at 10:45 AM Contact information: [address] [place] [address] [phone]                  The results of significant diagnostics from this hospitalization (including imaging, microbiology, ancillary and laboratory) are listed below for reference.    Significant Diagnostic Studies: DG Foot 2 Views Left Result Date: 03/24/2023 CLINICAL DATA:  Status post third metatarsal amputation for osteomyelitis. EXAM: LEFT FOOT - 2 VIEW COMPARISON:  Radiograph dated 03/23/2023. FINDINGS: Status post amputation of the mid third metatarsal. No acute fracture or dislocation. The bones are osteopenic. Postsurgical changes of the foot with soft tissue swelling. Antibiotic beads noted. IMPRESSION: Status post amputation of the mid third metatarsal. Electronically Signed   By: Elgie Collard M.D.   On: 03/24/2023 14:07   MR FOOT LEFT WO CONTRAST Result Date: 03/23/2023 CLINICAL DATA:  Osteomyelitis/abscess. EXAM: MRI OF THE LEFT FOOT WITHOUT CONTRAST TECHNIQUE: Multiplanar, multisequence MR imaging of the left foot was performed. No intravenous contrast was administered. COMPARISON:  03/23/2023 FINDINGS: Bones/Joint/Cartilage Prior third toe amputation. Osteomyelitis of the head and distal metadiaphysis of the third metatarsal. Second digit hammertoe deformity with hyper extension and dorsal subluxation at the distal interphalangeal joint. Abnormal edema in the distal phalanx of the great toe with erosion of the tuft compatible with osteomyelitis. Abnormal lateral erosion of the head of the first metatarsal with underlying marrow edema, possibly from a degenerative lesion and less likely to be from infection. Ligaments Poorly assessed due to motion Muscles and Tendons Regional muscular  atrophy, likely neurogenic. Soft tissues Substantial abscess primarily extending dorsal but also plantar to the eroded third metatarsal head, with abnormal fluid signal tracking to ulceration along the plantar foot. Cannot exclude draining sinus tract. The dorsal component of the abscess measures 5.0 by 2.4 by 3.1 cm (volume = 19 cm^3). Subcutaneous edema tracks dorsally in the foot and also in the great toe and along the plantar ball of the foot especially in the vicinity of the presumed ulceration. This likely reflects cellulitis. IMPRESSION: 1. Osteomyelitis of the head and distal metadiaphysis of the third metatarsal. 2. Osteomyelitis of the distal phalanx of the great toe. 3. Substantial abscess primarily extending dorsal but also plantar to the eroded third metatarsal head, with abnormal fluid signal tracking to ulceration along the plantar foot. Cannot exclude plantar draining sinus tract. Dorsal component of abscess 19 cc in volume. 4. Abnormal lateral erosion of  the head of the first metatarsal with underlying marrow edema, possibly from a degenerative lesion and less likely to be from infection. 5. Second digit hammertoe deformity with hyper extension and dorsal subluxation at the distal interphalangeal joint. 6. 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 Electronically Signed   By: Gaylyn Rong M.D.   On: 03/23/2023 19:25   DG Foot Complete Left Result Date: 03/23/2023 CLINICAL DATA:  [address] Infection [address] EXAM: LEFT FOOT - COMPLETE 3+ VIEW COMPARISON:  09/26/2022. FINDINGS: Redemonstration of amputation of third toe at the metatarsophalangeal joint. Since the prior study, there is destruction of the portion of the head of the third metatarsal. There is a small bone fragment in this region. Findings are equivocal for osteomyelitis. Correlate clinically and if needed, consider MRI exam. Otherwise, no acute fracture or dislocation. No aggressive osseous lesion. Mild multilevel degenerative  changes of imaged joints. No focal soft tissue swelling. No focal soft tissue defect or air within the soft tissue. No radiopaque foreign bodies. IMPRESSION: *Redemonstration of third toe amputation. *There is destruction of the portion of the head of the third metatarsal. There is a small bone fragment in this region. Findings are equivocal for osteomyelitis. Correlate clinically and if needed, consider MRI exam. Electronically Signed   By: Jules Schick M.D.   On: 03/23/2023 13:51    Microbiology: Recent Results (from the past 240 hours)  Culture, blood (single) w Reflex to ID Panel     Status: None (Preliminary result)   Collection Time: 03/23/23  7:48 PM   Specimen: BLOOD LEFT ARM  Result Value Ref Range Status   Specimen Description BLOOD LEFT ARM  Final   Special Requests   Final    BOTTLES DRAWN AEROBIC AND ANAEROBIC Blood Culture results may not be optimal due to an inadequate volume of blood received in culture bottles   Culture   Final    NO GROWTH 4 DAYS Performed at [facility], [address], [place], [place] [address]    Report Status PENDING  Incomplete  Aerobic/Anaerobic Culture w Gram Stain (surgical/deep wound)     Status: None (Preliminary result)   Collection Time: 03/24/23  9:34 AM   Specimen: PATH Amputaion Arm/Leg; Tissue  Result Value Ref Range Status   Specimen Description TISSUE  Final   Special Requests SOURCE A  Final   Gram Stain   Final    NO WBC SEEN NO ORGANISMS SEEN Performed at [facility], [address], [place], [place] [address]    Culture   Final    FEW STAPHYLOCOCCUS LUGDUNENSIS NO ANAEROBES ISOLATED; CULTURE IN PROGRESS FOR 5 DAYS    Report Status PENDING  Incomplete   Organism ID, Bacteria STAPHYLOCOCCUS LUGDUNENSIS  Final      Susceptibility   Staphylococcus lugdunensis - MIC*    CIPROFLOXACIN 2 INTERMEDIATE Intermediate     ERYTHROMYCIN <=0.25 SENSITIVE Sensitive     GENTAMICIN <=0.5 SENSITIVE Sensitive     OXACILLIN  <=0.25 SENSITIVE Sensitive     TETRACYCLINE <=1 SENSITIVE Sensitive     VANCOMYCIN 1 SENSITIVE Sensitive     TRIMETH/SULFA <=10 SENSITIVE Sensitive     CLINDAMYCIN <=0.25 SENSITIVE Sensitive     RIFAMPIN <=0.5 SENSITIVE Sensitive     Inducible Clindamycin NEGATIVE Sensitive     * FEW STAPHYLOCOCCUS LUGDUNENSIS  Aerobic/Anaerobic Culture w Gram Stain (surgical/deep wound)     Status: None (Preliminary result)   Collection Time: 03/24/23  9:35 AM   Specimen: PATH Amputaion Arm/Leg;  Tissue  Result Value Ref Range Status   Specimen Description TISSUE  Final   Special Requests NONE  Final   Gram Stain NO WBC SEEN NO ORGANISMS SEEN   Final   Culture   Final    NO GROWTH 3 DAYS NO ANAEROBES ISOLATED; CULTURE IN PROGRESS FOR 5 DAYS Performed at [facility], [address] [place], [place] [address]    Report Status PENDING  Incomplete     Labs: Basic Metabolic Panel: Recent Labs  Lab 03/23/23 1130 03/24/23 0625 03/25/23 0615 03/26/23 0558 03/27/23 0615  NA 137 136 137 138 135  K 3.9 4.1 4.2 4.6 4.1  CL 105 106 103 101 100  CO2 21* 24 26 27 29   GLUCOSE 143* 108* 104* 100* 102*  BUN 18 15 16 17 18   CREATININE 0.86 0.90 0.85 0.89 0.82  CALCIUM 9.3 8.7* 9.0 9.4 9.0  PHOS  --   --   --  4.1  --    Liver Function Tests: Recent Labs  Lab 03/23/23 1130 03/25/23 0615 03/26/23 0558  AST 24 16  --   ALT 24 19  --   ALKPHOS 75 71  --   BILITOT 0.8 0.6  --   PROT 7.0 6.5  --   ALBUMIN 3.4* 3.1* 3.3*   No results for input(s): "LIPASE", "AMYLASE" in the last 168 hours. No results for input(s): "AMMONIA" in the last 168 hours. CBC: Recent Labs  Lab 03/23/23 1130 03/24/23 0625 03/25/23 0615 03/26/23 0558 03/27/23 0615  WBC 6.2 6.4 8.3 9.2 10.4  NEUTROABS 4.3  --   --  5.4  --   HGB 12.8* 12.2* 12.7* 14.3 13.9  HCT 37.7* 35.5* 36.8* 41.5 40.5  MCV 89.8 89.2 89.5 88.9 88.8  PLT 264 256 274 303 318   Cardiac Enzymes: Recent Labs  Lab 03/26/23 0558  CKTOTAL 58    BNP: BNP (last 3 results) No results for input(s): "BNP" in the last 8760 hours.  ProBNP (last 3 results) No results for input(s): "PROBNP" in the last 8760 hours.  CBG: No results for input(s): "GLUCAP" in the last 168 hours.     Signed:  Ramiro Harvest MD.  [facility] 03/27/2023, 12:57 PM

## 2023-03-27 NOTE — Progress Notes (Signed)
 OT Screen Note  Patient Details Name: [name] MRN: [id number] DOB: 1965-04-09   Screened Treatment:    Reason Eval/Treat Not Completed: OT screened, no needs identified, will sign off Patient up and ambulating to the bathroom per PT, and does not require an acute OT evaluation. PT educating patient on DME if needed, and patient also acknowledging he has no acute OT needs. OT will sign off at this time; please re-consult if further acute needs arise.   Pollyann Glen E. Massiah Longanecker, OTR/L Acute Rehabilitation Services [phone]   [name] 03/27/2023, 10:13 AM

## 2023-03-27 NOTE — Progress Notes (Signed)
 [name] to be D/C'd Home per MD order.  Discussed with the patient and all questions fully answered.  IV catheter discontinued intact. Site without signs and symptoms of complications. Dressing and pressure applied.  An After Visit Summary was printed and given to the patient. Patient received prescriptions from [facility] pharmacy.  D/c education completed with patient/family including follow up instructions, medication list, d/c activities limitations if indicated, with other d/c instructions as indicated by MD - patient able to verbalize understanding, all questions fully answered.   Patient instructed to return to ED, call 911, or call MD for any changes in condition.   Patient escorted via WC, and D/C home via private auto.  [name] 03/27/2023 2:26 PM

## 2023-03-27 NOTE — Evaluation (Signed)
 Physical Therapy Evaluation Patient Details Name: [name] MRN: [id number] DOB: [birth year] Today's Date: 03/27/2023  History of Present Illness  58 y.o. male presents to [facility] hospital on 03/23/2023 with L foot wound. Pt underwent I&D of L foot wound on 2/25 and 2/27. PMH includes HTN, L 3rd toe resection, peripheral neuropathy, chronic pain.  Clinical Impression  Pt presents to PT mobilizing well s/p L foot I&D. Pt has good knowledge of his weightbearing restrictions, limiting ambulation and utilizing post-op shoe. Pt ambulates from bathroom with good stability. Pt declines stair training, technique discussed for limiting weightbearing through LLE. Pt declines DME for mobility and for bathroom setup. Pt has no further acute PT needs at this time. PT signing off.        If plan is discharge home, recommend the following: A little help with bathing/dressing/bathroom;Assistance with cooking/housework;Help with stairs or ramp for entrance;Assist for transportation   Can travel by private vehicle        Equipment Recommendations  (pt declines need DME, discussed possibility of a shower chair, BSC and RW)  Recommendations for Other Services       Functional Status Assessment Patient has had a recent decline in their functional status and demonstrates the ability to make significant improvements in function in a reasonable and predictable amount of time.     Precautions / Restrictions Precautions Precautions: Other (comment) (WB restrictions) Recall of Precautions/Restrictions: Intact Required Braces or Orthoses: Other Brace Other Brace: post-op shoe Restrictions Weight Bearing Restrictions Per Provider Order: Yes LLE Weight Bearing Per Provider Order: Weight bearing as tolerated Other Position/Activity Restrictions: WBAT in postop shoe for limited distances of ambulation and transfers      Mobility  Bed Mobility                    Transfers Overall transfer level:  Modified independent Equipment used: None               General transfer comment: pt stands from commode independently, in bathroom upon PT arrival    Ambulation/Gait Ambulation/Gait assistance: Modified independent (Device/Increase time) Gait Distance (Feet): 10 Feet Assistive device: None Gait Pattern/deviations: Step-to pattern, Decreased stance time - left, Wide base of support Gait velocity: reduced Gait velocity interpretation: <1.31 ft/sec, indicative of household ambulator   General Gait Details: pt with slowed step-to gait, widened BOS  Stairs Stairs:  (pt declines need for stair training, PT verbalizes technique of leading with RLE and utilizing L railing to offload L foot when ascending steps)          Wheelchair Mobility     Tilt Bed    Modified Rankin (Stroke Patients Only)       Balance Overall balance assessment: Modified Independent                                           Pertinent Vitals/Pain Pain Assessment Pain Assessment: Faces Faces Pain Scale: Hurts a little bit Pain Location: L foot Pain Descriptors / Indicators: Sore Pain Intervention(s): Monitored during session    Home Living Family/patient expects to be discharged to:: Private residence Living Arrangements: Spouse/significant other Available Help at Discharge: Family;Available 24 hours/day Type of Home: House Home Access: Stairs to enter Entrance Stairs-Rails: Left Entrance Stairs-Number of Steps: 3   Home Layout: One level Home Equipment: Cane - single point      Prior  Function Prior Level of Function : Independent/Modified Independent;Working/employed;Driving             Mobility Comments: working at [employer]       Extremity/Trunk Assessment   Upper Extremity Assessment Upper Extremity Assessment: Overall WFL for tasks assessed    Lower Extremity Assessment Lower Extremity Assessment: LLE deficits/detail LLE Deficits /  Details: hip and knee ROM/strength WFL, ankle/foot not assessed, dressing is clean and dry    Cervical / Trunk Assessment Cervical / Trunk Assessment: Normal  Communication   Communication Communication: No apparent difficulties    Cognition Arousal: Alert Behavior During Therapy: WFL for tasks assessed/performed   PT - Cognitive impairments: No apparent impairments                         Following commands: Intact       Cueing Cueing Techniques: Verbal cues     General Comments General comments (skin integrity, edema, etc.): VSS on RA    Exercises     Assessment/Plan    PT Assessment Patient does not need any further PT services  PT Problem List         PT Treatment Interventions      PT Goals (Current goals can be found in the Care Plan section)       Frequency       Co-evaluation               AM-PAC PT "6 Clicks" Mobility  Outcome Measure Help needed turning from your back to your side while in a flat bed without using bedrails?: None Help needed moving from lying on your back to sitting on the side of a flat bed without using bedrails?: None Help needed moving to and from a bed to a chair (including a wheelchair)?: None Help needed standing up from a chair using your arms (e.g., wheelchair or bedside chair)?: None Help needed to walk in hospital room?: None Help needed climbing 3-5 steps with a railing? : None 6 Click Score: 24    End of Session   Activity Tolerance: Patient tolerated treatment well Patient left: in bed;with call bell/phone within reach Nurse Communication: Mobility status PT Visit Diagnosis: Other abnormalities of gait and mobility (R26.89)    Time: 0981-1914 PT Time Calculation (min) (ACUTE ONLY): 12 min   Charges:   PT Evaluation $PT Eval Low Complexity: 1 Low   PT General Charges $$ ACUTE PT VISIT: 1 Visit         Arlyss Gandy, PT, DPT Acute Rehabilitation Office [phone]   Arlyss Gandy 03/27/2023, 9:44 AM

## 2023-03-28 LAB — CULTURE, BLOOD (SINGLE): Culture: NO GROWTH

## 2023-03-28 LAB — HCV AB W REFLEX TO QUANT PCR: HCV Ab: NONREACTIVE

## 2023-03-28 LAB — HCV INTERPRETATION

## 2023-03-29 LAB — AEROBIC/ANAEROBIC CULTURE W GRAM STAIN (SURGICAL/DEEP WOUND)
Culture: NO GROWTH
Gram Stain: NONE SEEN
Gram Stain: NONE SEEN

## 2023-03-30 ENCOUNTER — Telehealth: Payer: Self-pay | Admitting: *Deleted

## 2023-03-30 NOTE — Anesthesia Postprocedure Evaluation (Signed)
 Anesthesia Post Note  [name]  Procedure(s) Performed: IRRIGATION AND DEBRIDEMENT LEFT FOOT WITH GRAFT AND ANTIBIOTIC BEADS WITH DELAYED CLOSURE (Left: Foot)     Patient location during evaluation: PACU Anesthesia Type: MAC Level of consciousness: awake and alert Pain management: pain level controlled Vital Signs Assessment: post-procedure vital signs reviewed and stable Respiratory status: spontaneous breathing, nonlabored ventilation, respiratory function stable and patient connected to nasal cannula oxygen Cardiovascular status: stable and blood pressure returned to baseline Postop Assessment: no apparent nausea or vomiting Anesthetic complications: no   There were no known notable events for this encounter.  Last Vitals:  Vitals:   03/27/23 0414 03/27/23 0938  BP: 111/68 122/87  Pulse: 64 80  Resp: 19 20  Temp: 36.6 C 37.1 C  SpO2: 97% 97%    Last Pain:  Vitals:   03/27/23 0938  TempSrc: Oral  PainSc:                  Lynsey Ange S

## 2023-03-30 NOTE — Transitions of Care (Post Inpatient/ED Visit) (Signed)
 03/30/2023  Name: [name] MRN: [id number] DOB: 1965/05/24  Today's TOC FU Call Status: Today's TOC FU Call Status:: Successful TOC FU Call Completed TOC FU Call Complete Date: 03/30/23 Patient's Name and Date of Birth confirmed.  Transition Care Management Follow-up Telephone Call Date of Discharge: 03/27/23 Discharge Facility: [facility]) Type of Discharge: Inpatient Admission Primary Inpatient Discharge Diagnosis:: Osteomyelitis of foot How have you been since you were released from the hospital?: Better Any questions or concerns?: No  Items Reviewed: Did you receive and understand the discharge instructions provided?: Yes Medications obtained,verified, and reconciled?: Yes (Medications Reviewed) Any new allergies since your discharge?: No Dietary orders reviewed?: No Do you have support at home?: Yes People in Home: spouse Name of Support/Comfort Primary Source: Rindy  Medications Reviewed Today: Medications Reviewed Today     Reviewed by Luella Cook, RN (Case Manager) on 03/30/23 at [phone]  Med List Status: <None>   Medication Order Taking? Sig Documenting Provider Last Dose Status Informant  acetaminophen (TYLENOL) 325 MG tablet [id number] Yes Take 2 tablets (650 mg total) by mouth every 6 (six) hours as needed for mild pain (pain score 1-3). Rodolph Bong, MD Taking Active   Buprenorphine HCl-Naloxone HCl 8-2 MG FILM [id number] Yes Place 0.5 Film under the tongue in the morning, at noon, in the evening, and at bedtime. [provider] Taking Active Self, Pharmacy Records  cefadroxil (DURICEF) 500 MG capsule [id number] Yes Take 2 capsules (1,000 mg total) by mouth 2 (two) times daily for 28 days. Raymondo Band, MD Taking Active   cloNIDine (CATAPRES) 0.1 MG tablet [id number] Yes Take 0.1 mg by mouth daily. [provider] Taking Active Self, Pharmacy Records           Med Note Kandis Cocking [place], [place] A   Mon Mar 23, 2023  3:17 PM) Pt  takes prn  furosemide (LASIX) 40 MG tablet [id number] Yes Take 40 mg by mouth every morning. [provider] Taking Active Self, Pharmacy Records  gabapentin (NEURONTIN) 100 MG capsule [id number] Yes Take 100 mg by mouth at bedtime. [provider] Taking Active Self, Pharmacy Records           Med Note Kandis Cocking [place], [place] A   Mon Mar 23, 2023  3:17 PM) Pt takes prn  KLOR-CON M20 20 MEQ tablet [id number] Yes Take 20 mEq by mouth daily. [provider] Taking Active Self, Pharmacy Records           Med Note Kandis Cocking [place], [place] A   Mon Mar 23, 2023  3:19 PM) Pt states he only takes when he takes clonidine  naproxen (NAPROSYN) 500 MG tablet [id number] Yes Take 500 mg by mouth every 12 (twelve) hours as needed for moderate pain (pain score 4-6). [provider] Taking Active Self, Pharmacy Records  ondansetron (ZOFRAN) 4 MG tablet [id number] Yes Take 1 tablet (4 mg total) by mouth every 6 (six) hours as needed for nausea. Rodolph Bong, MD Taking Active   polyethylene glycol powder [facility]) 17 GM/SCOOP powder [id number] Yes Mix as directed and take 1 capful (17 g) with water by mouth 2 (two) times daily. Rodolph Bong, MD Taking Active   senna-docusate (SENOKOT-S) 8.6-50 MG tablet [id number] Yes Take 1 tablet by mouth 2 (two) times daily. Rodolph Bong, MD Taking Active             Home Care and Equipment/Supplies: Were Home Health Services Ordered?: NA Any new equipment or  medical supplies ordered?: NA  Functional Questionnaire: Do you need assistance with bathing/showering or dressing?: No Do you need assistance with meal preparation?: No Do you need assistance with eating?: No Do you have difficulty maintaining continence: No Do you need assistance with getting out of bed/getting out of a chair/moving?: No Do you have difficulty managing or taking your medications?: No  Follow up appointments reviewed: PCP Follow-up  appointment confirmed?: No MD Provider Line Number:[phone] Given: Yes (Patient will make appointment. Per order in f/u in 3-4 weeks) Specialist Hospital Follow-up appointment confirmed?: Yes Date of Specialist follow-up appointment?: 04/02/23 Follow-Up Specialty Provider:: [id number] Dr Annamary Rummage, 04/10/2023 Kieth Brightly NP. Do you need transportation to your follow-up appointment?: No Do you understand care options if your condition(s) worsen?: Yes-patient verbalized understanding  SDOH Interventions Today    Flowsheet Row Most Recent Value  SDOH Interventions   Food Insecurity Interventions Intervention Not Indicated  Housing Interventions Intervention Not Indicated  Transportation Interventions Intervention Not Indicated, Patient Resources (Friends/Family)  Utilities Interventions Intervention Not Indicated       Patient declined [facility] services   Gean Maidens BSN RN [facility] Care Management Coordinator [email or web address] Direct Dial: [phone]  Fax: [phone] Website: [email or web address]

## 2023-03-31 ENCOUNTER — Other Ambulatory Visit: Payer: Self-pay

## 2023-04-01 NOTE — Telephone Encounter (Signed)
 Left mess for patient at [phone] to get ok to fill out STD forms from the [facility]. They were faxed and his signed authorization was not included.

## 2023-04-02 ENCOUNTER — Ambulatory Visit (INDEPENDENT_AMBULATORY_CARE_PROVIDER_SITE_OTHER): Payer: BC Managed Care – PPO | Admitting: Podiatry

## 2023-04-02 ENCOUNTER — Encounter: Payer: Self-pay | Admitting: Podiatry

## 2023-04-02 VITALS — Ht 75.0 in | Wt 218.0 lb

## 2023-04-02 DIAGNOSIS — Z9889 Other specified postprocedural states: Secondary | ICD-10-CM

## 2023-04-02 DIAGNOSIS — M86172 Other acute osteomyelitis, left ankle and foot: Secondary | ICD-10-CM

## 2023-04-02 NOTE — Progress Notes (Signed)
  Subjective:  Patient ID: [name], male    DOB: [birth year],  MRN: [id number]   DOS: 03/26/2023 Procedure: 1.  Repeat irrigation debridement of surgical site with prep for graft, 6 x 1 x 2 cm, left foot 2.  Application antibiotic dissolvable calcium sulfate beads with vancomycin and tobramycin, left foot 3.  Application of acellular dermal graft micronized Kerecis 38 cm, left foot 4.  Delayed primary closure of surgical site, 6 x 1 x 2 cm, left foot    58 y.o. male seen for post op check.   He reports he is doing well he has minimal to no pain at the area.  Has been walking in a postop shoe as instructed left dressing clean dry and intact as instructed since leaving the hospital.  Review of Systems: Negative except as noted in the HPI. Denies N/V/F/Ch.   Objective:   There were no vitals filed for this visit.  Body mass index is 27.25 kg/m. Constitutional Well developed. Well nourished.  Vascular Foot warm and well perfused. Capillary refill normal to all digits.   No calf pain with palpation  Neurologic Normal speech. Oriented to person, place, and time. Epicritic sensation diminished  Dermatologic Incision is well coapted with no dehiscence drainage maceration or erythema overall healing as expected   Orthopedic: No pain on palpation   Radiographs: Status post amputation of the mid third metatarsal  Pathology: Bone left third metatarsal fragments of bone and fibroconnective tissue with acute information  Micro: Staph lugdunensis sensitive to clindamycin  Assessment:   1. Post-operative state   2. Other acute osteomyelitis of left foot (HCC)     Plan:  Patient was evaluated and treated and all questions answered.    1 week s/p repeat debridement antibiotic bead and graft application with delayed primary closure -Progressing very with no acute wound healing complication noted -Dressing changed and intact until next appointment -Finish course today  antibiotics as prescribed from discharge -Culture with staph lugdunensis recommend Abx per ID - plan for cefadroxil PO on DC -XR: Expected postop changes clean resection margin third metatarsal -WB Status: Weightbearing as tolerated in postop shoe for short distances and transfers only -Sutures: Remain intact 1 to 2 weeks -Medications/ABX: Per ID recs  -Dressing to remain clean dry and intact until follow-up in 1 week next week for wound check         Corinna Gab, DPM [facility] / [facility]

## 2023-04-07 ENCOUNTER — Telehealth: Payer: Self-pay | Admitting: Podiatry

## 2023-04-07 NOTE — Telephone Encounter (Signed)
 Called pt at [phone] to adv STD forms were faxed to [employer] [facility] at [phone]. I adv him I would mail him a copy for his records to addr on file.(He verified).

## 2023-04-09 ENCOUNTER — Encounter: Admitting: Podiatry

## 2023-04-10 ENCOUNTER — Ambulatory Visit: Payer: Self-pay | Admitting: Infectious Diseases

## 2023-04-10 ENCOUNTER — Encounter: Payer: Self-pay | Admitting: Infectious Diseases

## 2023-04-10 ENCOUNTER — Other Ambulatory Visit: Payer: Self-pay

## 2023-04-10 VITALS — BP 125/82 | HR 60 | Temp 98.0°F | Ht 75.0 in | Wt 225.0 lb

## 2023-04-10 DIAGNOSIS — M869 Osteomyelitis, unspecified: Secondary | ICD-10-CM

## 2023-04-10 NOTE — Progress Notes (Signed)
 [name]  DOB: 10-13-1965 MRN: [id number] PCP: [name], MD   Chief Complaint  Patient presents with   Hospitalization Follow-up    Osteo left foot      Subjective   Subjective:    Discussed the use of AI scribe software for clinical note transcription with the patient, who gave verbal consent to proceed.  History of Present Illness   [name] "Lorin Picket" is a 58 year old male who presents for follow-up on his foot surgery and infection management.  He has a history of recurrent foot infections associated with necrotic bone. Despite being on antibiotics previously, he experienced a sudden worsening of his foot condition, which led to a hospital visit. This aggressive infection necessitated surgical intervention.  He is following up after a recent foot surgery where necrotic bone was removed. The incision is healing well, with no significant pain or swelling. He was scheduled to have sutures removed earlier, but the appointment was rescheduled due to the doctor's illness.  He has been on Augmentin for a prolonged period due to recurrent infections. He is currently taking two pills twice a day, every twelve hours, and reports no issues with the medication. He underwent a debridement procedure and had absorbable antibiotic beads placed locally in addition to oral antibiotics. He is expected to continue antibiotics for approximately two more weeks and is tolerating them well.  No significant pain or swelling in the foot. No issues with the current antibiotic regimen.       ROS   Past Medical History:  Diagnosis Date   Lymphedema    Scoliosis    Sleep apnea     Outpatient Medications Prior to Visit  Medication Sig Dispense Refill   acetaminophen (TYLENOL) 325 MG tablet Take 2 tablets (650 mg total) by mouth every 6 (six) hours as needed for mild pain (pain score 1-3).     Buprenorphine HCl-Naloxone HCl 8-2 MG FILM Place 0.5 Film under the  tongue in the morning, at noon, in the evening, and at bedtime.     cefadroxil (DURICEF) 500 MG capsule Take 2 capsules (1,000 mg total) by mouth 2 (two) times daily for 28 days. 112 capsule 0   cloNIDine (CATAPRES) 0.1 MG tablet Take 0.1 mg by mouth daily.     furosemide (LASIX) 40 MG tablet Take 40 mg by mouth every morning.     gabapentin (NEURONTIN) 100 MG capsule Take 100 mg by mouth at bedtime.     KLOR-CON M20 20 MEQ tablet Take 20 mEq by mouth daily.     naproxen (NAPROSYN) 500 MG tablet Take 500 mg by mouth every 12 (twelve) hours as needed for moderate pain (pain score 4-6).     ondansetron (ZOFRAN) 4 MG tablet Take 1 tablet (4 mg total) by mouth every 6 (six) hours as needed for nausea. 20 tablet 0   polyethylene glycol powder (GLYCOLAX/MIRALAX) 17 GM/SCOOP powder Mix as directed and take 1 capful (17 g) with water by mouth 2 (two) times daily. 238 g 0   senna-docusate (SENOKOT-S) 8.6-50 MG tablet Take 1 tablet by mouth 2 (two) times daily. 20 tablet 0   No facility-administered medications prior to visit.     No Known Allergies  Social History   Tobacco Use   Smoking status: Former    Current packs/day: 0.00    Types: Cigarettes, E-cigarettes    Quit date: 06/24/2022    Years since quitting: 0.8   Smokeless tobacco:  Never   Tobacco comments:    I vape everyday.  [facility] 09/22/2022  Vaping Use   Vaping status: Some Days   Substances: Nicotine  Substance Use Topics   Alcohol use: Never    Family History  Problem Relation Age of Onset   Rashes / Skin problems Mother    Heart failure Father        Objective   Objective:   Vitals:   04/10/23 1115  BP: 125/82  Pulse: 60  Temp: 98 F (36.7 C)  TempSrc: Temporal  SpO2: 96%  Weight: 225 lb (102.1 kg)  Height: 6\' 3"  (1.905 m)   Body mass index is 28.12 kg/m.  Physical Exam Photos reviewed from recent podiatry encounter. Well approximated scar with sutures remaining. No erythema or induration noted. NO  drainage.      Assessment & Plan:      Osteomyelitis of the foot, Left - Chronic osteomyelitis with necrotic bone previously serving as infection reservoir. Recent debridement and necrotic bone removal expected to prevent recurrence. Good antibiotic tolerance and positive response indicated by decreasing inflammation markers. Infection caused by sensitive bacterium. - Continue antibiotics for two more weeks to treat staph lug - Order blood work to monitor inflammation markers. - Schedule follow-up in 3-4 weeks with Dr. Renold Don or current provider. - Communicate with Dr. Lemar Lofty for concerns or extended therapy needs. - Ensure completion of antibiotic course to prevent recurrence.  Post-surgical wound care Post-surgical wound healing well, no significant pain or swelling. Protective boot worn, wound care maintained. - Continue wearing protective boot. - Attend podiatrist appointment next Tuesday for suture removal. - Monitor for infection signs or complications, report if he occurs.         Orders Placed This Encounter  Procedures   C-reactive protein   Sedimentation rate   CBC w/Diff    No orders of the defined types were placed in this encounter.   Return in about 4 weeks (around 05/08/2023).   Rexene Alberts, MSN, NP-C [facility] for Infectious Disease [facility]  [email or web address] Pager: [phone] Office: [phone] [facility] Main Line: [phone] *Secure Chat Communication Welcome

## 2023-04-10 NOTE — Patient Instructions (Addendum)
 4 week follow up with Dr. Renold Don to check in on your foot   Please stop by the lab on your way out

## 2023-04-11 LAB — CBC WITH DIFFERENTIAL/PLATELET
Absolute Lymphocytes: 1750 {cells}/uL (ref 850–3900)
Absolute Monocytes: 520 {cells}/uL (ref 200–950)
Basophils Absolute: 50 {cells}/uL (ref 0–200)
Basophils Relative: 1 %
Eosinophils Absolute: 260 {cells}/uL (ref 15–500)
Eosinophils Relative: 5.2 %
HCT: 40.1 % (ref 38.5–50.0)
Hemoglobin: 13.5 g/dL (ref 13.2–17.1)
MCH: 30.5 pg (ref 27.0–33.0)
MCHC: 33.7 g/dL (ref 32.0–36.0)
MCV: 90.5 fL (ref 80.0–100.0)
MPV: 11.5 fL (ref 7.5–12.5)
Monocytes Relative: 10.4 %
Neutro Abs: 2420 {cells}/uL (ref 1500–7800)
Neutrophils Relative %: 48.4 %
Platelets: 299 10*3/uL (ref 140–400)
RBC: 4.43 10*6/uL (ref 4.20–5.80)
RDW: 12.7 % (ref 11.0–15.0)
Total Lymphocyte: 35 %
WBC: 5 10*3/uL (ref 3.8–10.8)

## 2023-04-11 LAB — C-REACTIVE PROTEIN: CRP: <3 mg/L (ref <8.0)

## 2023-04-11 LAB — SEDIMENTATION RATE: Sed Rate: 19 mm/h (ref 0–20)

## 2023-04-13 DIAGNOSIS — G4733 Obstructive sleep apnea (adult) (pediatric): Secondary | ICD-10-CM | POA: Diagnosis not present

## 2023-04-13 DIAGNOSIS — Z0000 Encounter for general adult medical examination without abnormal findings: Secondary | ICD-10-CM | POA: Diagnosis not present

## 2023-04-13 DIAGNOSIS — I872 Venous insufficiency (chronic) (peripheral): Secondary | ICD-10-CM | POA: Diagnosis not present

## 2023-04-13 DIAGNOSIS — R600 Localized edema: Secondary | ICD-10-CM | POA: Diagnosis not present

## 2023-04-13 DIAGNOSIS — E782 Mixed hyperlipidemia: Secondary | ICD-10-CM | POA: Diagnosis not present

## 2023-04-13 DIAGNOSIS — F1120 Opioid dependence, uncomplicated: Secondary | ICD-10-CM | POA: Diagnosis not present

## 2023-04-13 DIAGNOSIS — Z125 Encounter for screening for malignant neoplasm of prostate: Secondary | ICD-10-CM | POA: Diagnosis not present

## 2023-04-14 ENCOUNTER — Encounter: Payer: Self-pay | Admitting: Podiatry

## 2023-04-14 ENCOUNTER — Ambulatory Visit (INDEPENDENT_AMBULATORY_CARE_PROVIDER_SITE_OTHER): Admitting: Podiatry

## 2023-04-14 DIAGNOSIS — M86172 Other acute osteomyelitis, left ankle and foot: Secondary | ICD-10-CM

## 2023-04-14 DIAGNOSIS — I10 Essential (primary) hypertension: Secondary | ICD-10-CM | POA: Diagnosis not present

## 2023-04-14 DIAGNOSIS — F1120 Opioid dependence, uncomplicated: Secondary | ICD-10-CM | POA: Diagnosis not present

## 2023-04-14 DIAGNOSIS — R600 Localized edema: Secondary | ICD-10-CM | POA: Diagnosis not present

## 2023-04-14 DIAGNOSIS — M1990 Unspecified osteoarthritis, unspecified site: Secondary | ICD-10-CM | POA: Diagnosis not present

## 2023-04-14 DIAGNOSIS — Z9889 Other specified postprocedural states: Secondary | ICD-10-CM

## 2023-04-14 NOTE — Progress Notes (Signed)
  Subjective:  Patient ID: [name], male    DOB: [birth year],  MRN: [id number]   DOS: 03/26/2023 Procedure: 1.  Repeat irrigation debridement of surgical site with prep for graft, 6 x 1 x 2 cm, left foot 2.  Application antibiotic dissolvable calcium sulfate beads with vancomycin and tobramycin, left foot 3.  Application of acellular dermal graft micronized Kerecis 38 cm, left foot 4.  Delayed primary closure of surgical site, 6 x 1 x 2 cm, left foot    58 y.o. male seen for post op check.      Review of Systems: Negative except as noted in the HPI. Denies N/V/F/Ch.   Objective:   There were no vitals filed for this visit.  There is no height or weight on file to calculate BMI. Constitutional Well developed. Well nourished.  Vascular Foot warm and well perfused. Capillary refill normal to all digits.   No calf pain with palpation  Neurologic Normal speech. Oriented to person, place, and time. Epicritic sensation diminished  Dermatologic Incision is well coapted with no dehiscence drainage maceration or erythema overall healing as expected   Orthopedic: No pain on palpation   Radiographs: Status post amputation of the mid third metatarsal  Pathology: Bone left third metatarsal fragments of bone and fibroconnective tissue with acute information  Micro: Staph lugdunensis sensitive to clindamycin  Assessment:   1. Post-operative state   2. Other acute osteomyelitis of left foot (HCC)      Plan:  Patient was evaluated and treated and all questions answered.    2.5 week s/p repeat debridement antibiotic bead and graft application with delayed primary closure -Progressing very well at this time nearly fully healed -Recommend small amount of antibiotic ointment on the dorsal incision and then cover with a Band-Aid until fully healed changed this bandage dressing daily, okay to get wet in shower in 1 week -Okay to return to work next week Monday and wear regular  shoe at that time [employer] course today antibiotics as prescribed from discharge -Culture with staph lugdunensis recommend Abx per ID - plan for cefadroxil PO on DC -XR: Expected postop changes clean resection margin third metatarsal -WB Status: Weightbearing as tolerated in postop shoe until next week Monday and then go back to regular shoe gear -Sutures: Remain intact 1 to 2 weeks -Medications/ABX: Per ID recs          Corinna Gab, DPM [facility] / [facility]

## 2023-04-20 DIAGNOSIS — R600 Localized edema: Secondary | ICD-10-CM | POA: Diagnosis not present

## 2023-04-20 DIAGNOSIS — E782 Mixed hyperlipidemia: Secondary | ICD-10-CM | POA: Diagnosis not present

## 2023-04-20 DIAGNOSIS — N182 Chronic kidney disease, stage 2 (mild): Secondary | ICD-10-CM | POA: Diagnosis not present

## 2023-04-20 DIAGNOSIS — M869 Osteomyelitis, unspecified: Secondary | ICD-10-CM | POA: Diagnosis not present

## 2023-04-22 ENCOUNTER — Telehealth: Payer: Self-pay | Admitting: Podiatry

## 2023-04-22 NOTE — Telephone Encounter (Signed)
 Patient saw Dr. Annamary Rummage about a week ago to get stiches out after surgery.  Patient states the wound is still weeping.  No fever, infection.  Wants to know if this is ok or should he be seen?

## 2023-05-12 ENCOUNTER — Encounter: Payer: Self-pay | Admitting: Internal Medicine

## 2023-05-12 ENCOUNTER — Ambulatory Visit (INDEPENDENT_AMBULATORY_CARE_PROVIDER_SITE_OTHER): Admitting: Internal Medicine

## 2023-05-12 ENCOUNTER — Other Ambulatory Visit: Payer: Self-pay

## 2023-05-12 VITALS — BP 138/79 | HR 73 | Temp 98.6°F | Ht 75.0 in | Wt 233.0 lb

## 2023-05-12 DIAGNOSIS — M86172 Other acute osteomyelitis, left ankle and foot: Secondary | ICD-10-CM

## 2023-05-12 DIAGNOSIS — E11628 Type 2 diabetes mellitus with other skin complications: Secondary | ICD-10-CM | POA: Diagnosis not present

## 2023-05-12 DIAGNOSIS — L089 Local infection of the skin and subcutaneous tissue, unspecified: Secondary | ICD-10-CM | POA: Diagnosis not present

## 2023-05-12 DIAGNOSIS — M869 Osteomyelitis, unspecified: Secondary | ICD-10-CM

## 2023-05-12 NOTE — Patient Instructions (Signed)
 Inflammatory marker testing today (crp)   Please see one of us  again in around 8-10 weeks for repeat evaluation; if at that time you are doing well still then we'll discharge you from our clinic

## 2023-05-12 NOTE — Progress Notes (Addendum)
 [facility]  Patient Active Problem List   Diagnosis Date Noted   Constipation 03/25/2023   Pyogenic inflammation of bone (HCC) 03/23/2023   Osteomyelitis (HCC) 03/23/2023   Sepsis due to cellulitis (HCC) 11/27/2021   Peripheral neuropathy 11/27/2021   Cellulitis of left foot    Elevated blood-pressure reading without diagnosis of hypertension 02/19/2021   Encounter for general adult medical examination without abnormal findings 02/19/2021   Leg length discrepancy 02/19/2021   Mixed hyperlipidemia 02/19/2021   Nondependent opioid abuse (HCC) 02/19/2021   Obstructive sleep apnea 02/19/2021   Pedal edema 02/19/2021   Peripheral venous insufficiency 02/19/2021   Venous stasis dermatitis of right lower extremity 02/19/2021   Lymphedema 12/17/2018   Current smoker 08/07/2016   Hypertension 07/21/2013   Osteoarthritis 07/21/2013   Seasonal allergic rhinitis 07/21/2013      Subjective:    Patient ID: [name], male    DOB: 1965/11/20, 58 y.o.   MRN: [id number]  Chief Complaint  Patient presents with   Follow-up    HPI:  [name] is a 58 y.o. male for diabetic foot infection   05/12/23 id clinic visit See a&p for detail   No Known Allergies    Outpatient Medications Prior to Visit  Medication Sig Dispense Refill   acetaminophen (TYLENOL) 325 MG tablet Take 2 tablets (650 mg total) by mouth every 6 (six) hours as needed for mild pain (pain score 1-3).     Buprenorphine HCl-Naloxone HCl 8-2 MG FILM Place 0.5 Film under the tongue in the morning, at noon, in the evening, and at bedtime.     cloNIDine (CATAPRES) 0.1 MG tablet Take 0.1 mg by mouth daily.     furosemide (LASIX) 40 MG tablet Take 40 mg by mouth every morning.     gabapentin (NEURONTIN) 100 MG capsule Take 100 mg by mouth at bedtime.     KLOR-CON M20 20 MEQ tablet Take 20 mEq by mouth daily.     naproxen (NAPROSYN) 500 MG tablet Take 500 mg by mouth every  12 (twelve) hours as needed for moderate pain (pain score 4-6).     ondansetron (ZOFRAN) 4 MG tablet Take 1 tablet (4 mg total) by mouth every 6 (six) hours as needed for nausea. 20 tablet 0   polyethylene glycol powder (GLYCOLAX/MIRALAX) 17 GM/SCOOP powder Mix as directed and take 1 capful (17 g) with water by mouth 2 (two) times daily. 238 g 0   senna-docusate (SENOKOT-S) 8.6-50 MG tablet Take 1 tablet by mouth 2 (two) times daily. 20 tablet 0   No facility-administered medications prior to visit.     Social History   Socioeconomic History   Marital status: Married    Spouse name: Not on file   Number of children: Not on file   Years of education: Not on file   Highest education level: Not on file  Occupational History   Not on file  Tobacco Use   Smoking status: Former    Current packs/day: 0.00    Types: Cigarettes, E-cigarettes    Quit date: 06/24/2022    Years since quitting: 0.8   Smokeless tobacco: Never   Tobacco comments:    I vape everyday.  [facility] 09/22/2022  Vaping Use   Vaping status: Some Days   Substances: Nicotine  Substance and Sexual Activity   Alcohol use: Never   Drug use: Not on file    Comment: on subxone currently  Sexual activity: Yes    Partners: Female    Birth control/protection: None  Other Topics Concern   Not on file  Social History Narrative   Not on file   Social Drivers of Health   Financial Resource Strain: Not on file  Food Insecurity: No Food Insecurity (03/30/2023)   Hunger Vital Sign    Worried About Running Out of Food in the Last Year: Never true    Ran Out of Food in the Last Year: Never true  Transportation Needs: No Transportation Needs (03/30/2023)   PRAPARE - Administrator, Civil Service (Medical): No    Lack of Transportation (Non-Medical): No  Physical Activity: Not on file  Stress: Not on file  Social Connections: Not on file  Intimate Partner Violence: Not At Risk (03/30/2023)   Humiliation, Afraid, Rape,  and Kick questionnaire    Fear of Current or Ex-Partner: No    Emotionally Abused: No    Physically Abused: No    Sexually Abused: No      Review of Systems    All other ros negative   Objective:    BP 138/79   Pulse 73   Temp 98.6 F (37 C) (Oral)   Ht 6\' 3"  (1.905 m)   Wt 233 lb (105.7 kg)   SpO2 96%   BMI 29.12 kg/m  Nursing note and vital signs reviewed.  Physical Exam     General/constitutional: no distress, pleasant HEENT: Normocephalic, PER, Conj Clear, EOMI, Oropharynx clear Neck supple CV: rrr no mrg Lungs: clear to auscultation, normal respiratory effort Abd: Soft, Nontender Ext: no edema Skin: No Rash Neuro: nonfocal MSK: no peripheral joint swelling/tenderness/warmth; back spines nontender    Labs: Lab Results  Component Value Date   WBC 5.0 04/10/2023   HGB 13.5 04/10/2023   HCT 40.1 04/10/2023   MCV 90.5 04/10/2023   PLT 299 04/10/2023   Last metabolic panel Lab Results  Component Value Date   GLUCOSE 102 (H) 03/27/2023   NA 135 03/27/2023   K 4.1 03/27/2023   CL 100 03/27/2023   CO2 29 03/27/2023   BUN 18 03/27/2023   CREATININE 0.82 03/27/2023   GFRNONAA >60 03/27/2023   CALCIUM 9.0 03/27/2023   PHOS 4.1 03/26/2023   PROT 6.5 03/25/2023   ALBUMIN 3.3 (L) 03/26/2023   BILITOT 0.6 03/25/2023   ALKPHOS 71 03/25/2023   AST 16 03/25/2023   ALT 19 03/25/2023   ANIONGAP 6 03/27/2023   Lab Results  Component Value Date   CRP <3.0 04/10/2023    Micro:  Serology:  Imaging:  Assessment & Plan:   Problem List Items Addressed This Visit     Osteomyelitis (HCC)   Other Visit Diagnoses       Diabetic foot infection (HCC)    -  Primary   Relevant Orders   C-reactive protein         No orders of the defined types were placed in this encounter.    58 yo male with left foot diabetic om   S/p I&D with abx beads placment; cx grew staph lugdenensis. He was on agumentin long course prior to admission   2/25  operative cx staph lug (s tetra, bactrim, oxacillin)   Reviewed operative note --> s/p 3rd metatarsal head resection; 2nd debridement needed and wound open for secondary closure   Mri did suggest 1. Osteomyelitis of the head and distal metadiaphysis of the third metatarsal. 2. Osteomyelitis of the distal phalanx of the  great toe. 3. Substantial abscess primarily extending dorsal but also plantar to the eroded third metatarsal head, with abnormal fluid signal tracking to ulceration along the plantar foot. Cannot exclude plantar draining sinus tract. Dorsal component of abscess 19 cc in volume. 4. Abnormal lateral erosion of the head of the first metatarsal with underlying marrow edema, possibly from a degenerative lesion and less likely to be from infection. 5. Second digit hammertoe deformity with hyper extension and dorsal subluxation at the distal interphalangeal joint. 6. 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   Discharged 03/27/23 on 4 weeks cefadroxil  ----- 05/12/23 id clinic assessment 04/11/23 labs reviewed; crp normalized on abx Finished abx 2 weeks ago All wound had healed. He has very minimal discomfort in his previously-infected foot if standing on it or walking on it too long. No redness/swelling No fever, chill  Will repeat labs today  Would f/u again in around 8-10 weeks  Follow-up: Return in about 8 weeks (around 07/07/2023).      Jamesetta Mcbride, MD [facility] 05/12/2023, 3:10 PM

## 2023-05-12 NOTE — Addendum Note (Signed)
 Addended byCephas Collier T on: 05/12/2023 03:19 PM   Modules accepted: Orders

## 2023-05-13 DIAGNOSIS — F1120 Opioid dependence, uncomplicated: Secondary | ICD-10-CM | POA: Diagnosis not present

## 2023-05-13 LAB — C-REACTIVE PROTEIN: CRP: <3 mg/L (ref <8.0)

## 2023-05-14 ENCOUNTER — Encounter: Admitting: Podiatry

## 2023-05-19 ENCOUNTER — Ambulatory Visit (INDEPENDENT_AMBULATORY_CARE_PROVIDER_SITE_OTHER): Admitting: Podiatry

## 2023-05-19 DIAGNOSIS — M86172 Other acute osteomyelitis, left ankle and foot: Secondary | ICD-10-CM

## 2023-05-19 DIAGNOSIS — Z9889 Other specified postprocedural states: Secondary | ICD-10-CM

## 2023-05-19 DIAGNOSIS — L97521 Non-pressure chronic ulcer of other part of left foot limited to breakdown of skin: Secondary | ICD-10-CM

## 2023-05-19 NOTE — Progress Notes (Signed)
  Subjective:  Patient ID: [name], male    DOB: 10-26-1965,  MRN: [id number]   DOS: 03/26/2023 Procedure: 1.  Repeat irrigation debridement of surgical site with prep for graft, 6 x 1 x 2 cm, left foot 2.  Application antibiotic dissolvable calcium sulfate beads with vancomycin  and tobramycin , left foot 3.  Application of acellular dermal graft micronized Kerecis 38 cm, left foot 4.  Delayed primary closure of surgical site, 6 x 1 x 2 cm, left foot    58 y.o. male seen for post op check.    He reports the wound is fully healed at this time no concerns or issues, has not been draining recently.  Review of Systems: Negative except as noted in the HPI. Denies N/V/F/Ch.   Objective:   There were no vitals filed for this visit.  There is no height or weight on file to calculate BMI. Constitutional Well developed. Well nourished.  Vascular Foot warm and well perfused. Capillary refill normal to all digits.   No calf pain with palpation  Neurologic Normal speech. Oriented to person, place, and time. Epicritic sensation diminished  Dermatologic Incision is well healed at this time with mild eschar no erythema or drainage.  Wound fully healed dorsally and no open wound plantar forefoot.   Orthopedic: No pain on palpation   Radiographs: Status post amputation of the mid third metatarsal  Pathology: Bone left third metatarsal fragments of bone and fibroconnective tissue with acute information  Micro: Staph lugdunensis sensitive to clindamycin  Assessment:   1. Post-operative state   2. Other acute osteomyelitis of left foot (HCC)   3. Ulcer of left foot, limited to breakdown of skin [facility])       Plan:  Patient was evaluated and treated and all questions answered.    8 wk s/p repeat debridement antibiotic bead and graft application with delayed primary closure - Fully healed surgical site at this time - No further wound care needed - Okay to continue at work with  regular shoe - No further antibiotics indicated -XR: Expected postop changes clean resection margin third metatarsal -WB Status: Weightbearing as tolerated regular shoe gear -Discharge in regards to the third metatarsal head resection with graft and antibiotic bead application. - Follow-up for routine nail/ at risk foot care         Maridee Shoemaker, DPM [facility] / [facility]

## 2023-05-21 ENCOUNTER — Encounter: Payer: Self-pay | Admitting: Podiatry

## 2023-05-21 ENCOUNTER — Ambulatory Visit (INDEPENDENT_AMBULATORY_CARE_PROVIDER_SITE_OTHER): Admitting: Podiatry

## 2023-05-21 DIAGNOSIS — B351 Tinea unguium: Secondary | ICD-10-CM

## 2023-05-21 DIAGNOSIS — I890 Lymphedema, not elsewhere classified: Secondary | ICD-10-CM

## 2023-05-21 DIAGNOSIS — Z89422 Acquired absence of other left toe(s): Secondary | ICD-10-CM

## 2023-05-21 DIAGNOSIS — M79674 Pain in right toe(s): Secondary | ICD-10-CM

## 2023-05-21 DIAGNOSIS — I739 Peripheral vascular disease, unspecified: Secondary | ICD-10-CM

## 2023-05-21 DIAGNOSIS — M79675 Pain in left toe(s): Secondary | ICD-10-CM

## 2023-05-21 HISTORY — DX: Tinea unguium: B35.1

## 2023-05-21 NOTE — Progress Notes (Signed)
 This patient presents to the office with chief complaint of long thick painful nails.  Patient says the nails are painful walking and wearing shoes.  This patient is unable to self treat.  This patient is unable to trim his  nails since she is unable to reach his nails.  She presents to the office for preventative foot care services.  General Appearance  Alert, conversant and in no acute stress.  Vascular  Dorsalis pedis and posterior tibial  pulses are weakly  palpable  bilaterally.  Capillary return is within normal limits  bilaterally. Temperature is within normal limits  bilaterally. Severe swelling due to lymphedema.  Neurologic  Senn-Weinstein monofilament wire test within normal limits  bilaterally. Muscle power within normal limits bilaterally.  Nails Thick disfigured discolored nails with subungual debris  from hallux to fifth toes bilaterally. No evidence of bacterial infection or drainage bilaterally.  Orthopedic  No limitations of motion  feet .  No crepitus or effusions noted.  No bony pathology or digital deformities noted. Amputation digit left foot.  Skin  normotropic skin with no porokeratosis noted bilaterally.  No signs of infections or ulcers noted.     Onychomycosis  Nails  B/L.  Pain in right toes  Pain in left toes  Debridement of nails both feet followed trimming the nails with dremel tool.    RTC 3 months.   Ruffin Cotton DPM

## 2023-06-09 ENCOUNTER — Encounter (HOSPITAL_COMMUNITY): Payer: Self-pay | Admitting: Podiatry

## 2023-06-11 ENCOUNTER — Ambulatory Visit: Payer: BC Managed Care – PPO | Admitting: Podiatry

## 2023-06-16 DIAGNOSIS — F1120 Opioid dependence, uncomplicated: Secondary | ICD-10-CM | POA: Diagnosis not present

## 2023-07-02 DIAGNOSIS — R600 Localized edema: Secondary | ICD-10-CM | POA: Diagnosis not present

## 2023-07-02 DIAGNOSIS — F1120 Opioid dependence, uncomplicated: Secondary | ICD-10-CM | POA: Diagnosis not present

## 2023-07-02 DIAGNOSIS — K649 Unspecified hemorrhoids: Secondary | ICD-10-CM | POA: Diagnosis not present

## 2023-07-02 DIAGNOSIS — M1990 Unspecified osteoarthritis, unspecified site: Secondary | ICD-10-CM | POA: Diagnosis not present

## 2023-07-07 ENCOUNTER — Ambulatory Visit: Admitting: Internal Medicine

## 2023-07-22 DIAGNOSIS — F1120 Opioid dependence, uncomplicated: Secondary | ICD-10-CM | POA: Diagnosis not present

## 2023-07-22 DIAGNOSIS — I739 Peripheral vascular disease, unspecified: Secondary | ICD-10-CM | POA: Diagnosis not present

## 2023-07-22 DIAGNOSIS — I10 Essential (primary) hypertension: Secondary | ICD-10-CM | POA: Diagnosis not present

## 2023-07-22 DIAGNOSIS — L0390 Cellulitis, unspecified: Secondary | ICD-10-CM | POA: Diagnosis not present

## 2023-08-18 DIAGNOSIS — F1120 Opioid dependence, uncomplicated: Secondary | ICD-10-CM | POA: Diagnosis not present

## 2023-08-20 ENCOUNTER — Ambulatory Visit: Admitting: Podiatry

## 2023-08-26 DIAGNOSIS — L0390 Cellulitis, unspecified: Secondary | ICD-10-CM | POA: Diagnosis not present

## 2023-08-26 DIAGNOSIS — F1120 Opioid dependence, uncomplicated: Secondary | ICD-10-CM | POA: Diagnosis not present

## 2023-08-26 DIAGNOSIS — I739 Peripheral vascular disease, unspecified: Secondary | ICD-10-CM | POA: Diagnosis not present

## 2023-08-26 DIAGNOSIS — R600 Localized edema: Secondary | ICD-10-CM | POA: Diagnosis not present

## 2023-08-31 ENCOUNTER — Telehealth: Payer: Self-pay | Admitting: Podiatry

## 2023-08-31 DIAGNOSIS — R600 Localized edema: Secondary | ICD-10-CM | POA: Diagnosis not present

## 2023-08-31 DIAGNOSIS — F1120 Opioid dependence, uncomplicated: Secondary | ICD-10-CM | POA: Diagnosis not present

## 2023-08-31 DIAGNOSIS — M549 Dorsalgia, unspecified: Secondary | ICD-10-CM | POA: Diagnosis not present

## 2023-08-31 DIAGNOSIS — I10 Essential (primary) hypertension: Secondary | ICD-10-CM | POA: Diagnosis not present

## 2023-08-31 NOTE — Telephone Encounter (Signed)
 Tried to reach patient regarding drainage, no answer and voicemail is full. MyChart message sent requesting additional information.

## 2023-08-31 NOTE — Telephone Encounter (Signed)
 Patient called in regards to L foot surgical site from February of this year. Patient states their is discharge coming from the area- clear and yellowish in color, no odor noted. Patient is wanting to know how he should proceed with this. Thanks

## 2023-09-01 NOTE — Telephone Encounter (Signed)
 Patient informed of provider instructions. He does have an appointment already scheduled for next week.

## 2023-09-08 ENCOUNTER — Ambulatory Visit (INDEPENDENT_AMBULATORY_CARE_PROVIDER_SITE_OTHER): Admitting: Podiatry

## 2023-09-08 DIAGNOSIS — M86172 Other acute osteomyelitis, left ankle and foot: Secondary | ICD-10-CM | POA: Diagnosis not present

## 2023-09-08 DIAGNOSIS — Z89422 Acquired absence of other left toe(s): Secondary | ICD-10-CM | POA: Diagnosis not present

## 2023-09-08 DIAGNOSIS — I872 Venous insufficiency (chronic) (peripheral): Secondary | ICD-10-CM | POA: Diagnosis not present

## 2023-09-08 NOTE — Progress Notes (Signed)
  Subjective:  Patient ID: [name], male    DOB: [birth year],  MRN: [id number]   DOS: 03/26/2023 Procedure: 1.  Repeat irrigation debridement of surgical site with prep for graft, 6 x 1 x 2 cm, left foot 2.  Application antibiotic dissolvable calcium sulfate beads with vancomycin  and tobramycin , left foot 3.  Application of acellular dermal graft micronized Kerecis 38 cm, left foot 4.  Delayed primary closure of surgical site, 6 x 1 x 2 cm, left foot    58 y.o. male seen for post op check.    Previously he had a fully healed wound at the top of the left foot.  He reports recent drainage from the incision on the top of the left foot.  He thinks it may be due to new use of recent work shoes.  He says the drainage has stopped since he started using Betadine and bandaging the area.  He also reports some edema in the left lower extremity and wants to referral to vascular specialist for evaluation.  He previously saw an outside vascular specialist for the right leg.   Review of Systems: Negative except as noted in the HPI. Denies N/V/F/Ch.   Objective:   There were no vitals filed for this visit.  There is no height or weight on file to calculate BMI. Constitutional Well developed. Well nourished.  Vascular Foot warm and well perfused. Capillary refill normal to all digits.   No calf pain with palpation  Neurologic Normal speech. Oriented to person, place, and time. Epicritic sensation diminished  Dermatologic No open wound in the dorsal midfoot at site of prior incision.  Appears to be well-healed no maceration or drainage visible.  No erythema.  No evidence of infection Callus present on the medial hallux IPJ as well as the lateral aspect of the fifth metatarsal head.  No underlying ulcerations.    Orthopedic: No pain on palpation   Radiographs: Status post amputation of the mid third metatarsal  Pathology: Bone left third metatarsal fragments of bone and fibroconnective  tissue with acute information  Micro: Staph lugdunensis sensitive to clindamycin  Assessment:   1. History of amputation of lesser toe of left foot (HCC)   2. Other acute osteomyelitis of left foot (HCC)   3. Venous (peripheral) insufficiency     Plan:  Patient was evaluated and treated and all questions answered.    5.5 mo s/p repeat debridement antibiotic bead and graft application with delayed primary closure - Fully healed surgical site at this time, no evidence of acute infection at this time continue to monitor.  May have been due to too tight of shoes. -If he does have further drainage recommend Betadine and adhesive bandage - Okay to continue at work with regular shoe, though he should find shoes that are wider and continue to use his diabetic shoes and liners until he gets those. - No antibiotics indicated -XR: Expected postop changes clean resection margin third metatarsal -WB Status: Weightbearing as tolerated regular shoe gear - Follow-up for routine nail/ at risk foot care  # Venous insufficiency left lower extremity with edema - Patient asked for referral to vascular services with [facility].  I agree and feel he could benefit from evaluation for venous insufficiency and edema in the left lower extremity.  Referral was placed to [facility].         [name], DPM [facility] / [facility]

## 2023-09-15 DIAGNOSIS — F1120 Opioid dependence, uncomplicated: Secondary | ICD-10-CM | POA: Diagnosis not present

## 2023-09-30 DIAGNOSIS — F1120 Opioid dependence, uncomplicated: Secondary | ICD-10-CM | POA: Diagnosis not present

## 2023-10-13 DIAGNOSIS — M419 Scoliosis, unspecified: Secondary | ICD-10-CM | POA: Diagnosis not present

## 2023-10-13 DIAGNOSIS — R072 Precordial pain: Secondary | ICD-10-CM | POA: Diagnosis not present

## 2023-10-13 DIAGNOSIS — M79631 Pain in right forearm: Secondary | ICD-10-CM | POA: Diagnosis not present

## 2023-10-20 DIAGNOSIS — F1120 Opioid dependence, uncomplicated: Secondary | ICD-10-CM | POA: Diagnosis not present

## 2023-10-28 DIAGNOSIS — F1120 Opioid dependence, uncomplicated: Secondary | ICD-10-CM | POA: Diagnosis not present

## 2023-10-28 DIAGNOSIS — I10 Essential (primary) hypertension: Secondary | ICD-10-CM | POA: Diagnosis not present

## 2023-10-28 DIAGNOSIS — M549 Dorsalgia, unspecified: Secondary | ICD-10-CM | POA: Diagnosis not present

## 2023-10-28 DIAGNOSIS — R600 Localized edema: Secondary | ICD-10-CM | POA: Diagnosis not present

## 2023-11-17 DIAGNOSIS — F1120 Opioid dependence, uncomplicated: Secondary | ICD-10-CM | POA: Diagnosis not present

## 2023-11-17 DIAGNOSIS — G8929 Other chronic pain: Secondary | ICD-10-CM | POA: Diagnosis not present

## 2023-11-30 DIAGNOSIS — M1990 Unspecified osteoarthritis, unspecified site: Secondary | ICD-10-CM | POA: Diagnosis not present

## 2023-11-30 DIAGNOSIS — F1120 Opioid dependence, uncomplicated: Secondary | ICD-10-CM | POA: Diagnosis not present

## 2023-11-30 DIAGNOSIS — I10 Essential (primary) hypertension: Secondary | ICD-10-CM | POA: Diagnosis not present

## 2023-11-30 DIAGNOSIS — R600 Localized edema: Secondary | ICD-10-CM | POA: Diagnosis not present

## 2023-12-04 NOTE — Progress Notes (Signed)
 Cardiology Office Note:    Date:  12/08/2023   ID:  [name], DOB 24-Jan-1966, MRN [id number]  PCP:  Verdia Lombard, MD   [facility] Providers Cardiologist:  None     Referring MD: Verdia Lombard, MD   Chief Complaint  Patient presents with   Chest Pain    History of Present Illness:    [name] is a 58 y.o. male is seen at the request of Dr Verdia for evaluation of chest pain. He has a history of  HLD, OSA, tobacco abuse. He had prior cardiac evaluation in 2007 with abnormal myoview showing possible anterior ischemia. This led to cardiac cath showing normal coronary anatomy.  He reports that he has intermittent mid chest pain. Not related to activity. No radiation. No dyspnea. He has a lot of back problems with scoliosis and is in pain all the time. He is a long time smoker. Parents have both had valvular heart disease. He does have a history of venous insufficiency. States he developed lymphedema after laser procedure. Manages with sleeve/compression.   Past Medical History:  Diagnosis Date   Cellulitis of left foot    Constipation 03/25/2023   Current smoker 08/07/2016   Elevated blood-pressure reading without diagnosis of hypertension 02/19/2021   Hypertension 07/21/2013   Leg length discrepancy 02/19/2021   Lymphedema    Mixed hyperlipidemia 02/19/2021   Nondependent opioid abuse (HCC) 02/19/2021   Obstructive sleep apnea 02/19/2021   Osteoarthritis 07/21/2013   Osteomyelitis (HCC) 03/23/2023   Pain due to onychomycosis of toenails of both feet 05/21/2023   Pedal edema 02/19/2021   Peripheral neuropathy 11/27/2021   Peripheral venous insufficiency 02/19/2021   Pyogenic inflammation of bone (HCC) 03/23/2023   Scoliosis    Seasonal allergic rhinitis 07/21/2013   Sepsis due to cellulitis (HCC) 11/27/2021   Sleep apnea    Venous stasis dermatitis of right lower extremity 02/19/2021    Past Surgical History:  Procedure  Laterality Date   IRRIGATION AND DEBRIDEMENT ABSCESS Left 11/27/2021   Procedure: IRRIGATION AND DEBRIDEMENT foot  3rd ray amputatiion;  Surgeon: Malvin Marsa FALCON, DPM;  Location: [facility];  Service: Podiatry;  Laterality: Left;   IRRIGATION AND DEBRIDEMENT FOOT Left 03/26/2023   Procedure: IRRIGATION AND DEBRIDEMENT LEFT FOOT WITH GRAFT AND ANTIBIOTIC BEADS WITH DELAYED CLOSURE;  Surgeon: Malvin Marsa FALCON, DPM;  Location: [facility] OR;  Service: Orthopedics/Podiatry;  Laterality: Left;  Left 3rd met head resection   METATARSAL HEAD EXCISION Left 03/24/2023   Procedure: LEFT FOOT INCISION AND DRAINAGE WITH THIRD METATARSAL HEAD EXCISION, ANTIBIOTIC BEADS;  Surgeon: Malvin Marsa FALCON, DPM;  Location: [facility] OR;  Service: Orthopedics/Podiatry;  Laterality: Left;  Left 3rd met head resection    Current Medications: Current Meds  Medication Sig   acetaminophen  (TYLENOL ) 325 MG tablet Take 2 tablets (650 mg total) by mouth every 6 (six) hours as needed for mild pain (pain score 1-3).   Buprenorphine  HCl-Naloxone  HCl 8-2 MG FILM Place 0.5 Film under the tongue in the morning, at noon, in the evening, and at bedtime.   cloNIDine  (CATAPRES ) 0.1 MG tablet Take 0.1 mg by mouth daily.   furosemide  (LASIX ) 40 MG tablet Take 40 mg by mouth every morning.   KLOR-CON  M20 20 MEQ tablet Take 20 mEq by mouth daily.   metoprolol tartrate (LOPRESSOR) 100 MG tablet Take 100 mg 2 hours before Coronary CTA   naproxen  (NAPROSYN ) 500 MG tablet Take 500 mg by mouth every 12 (twelve) hours as  needed for moderate pain (pain score 4-6).   ondansetron  (ZOFRAN ) 4 MG tablet Take 1 tablet (4 mg total) by mouth every 6 (six) hours as needed for nausea.   polyethylene glycol powder (GLYCOLAX /MIRALAX ) 17 GM/SCOOP powder Mix as directed and take 1 capful (17 g) with water by mouth 2 (two) times daily.   senna-docusate (SENOKOT-S) 8.6-50 MG tablet Take 1 tablet by mouth 2 (two) times daily.     Allergies:   Patient has no  known allergies.   Social History   Socioeconomic History   Marital status: Married    Spouse name: Not on file   Number of children: Not on file   Years of education: Not on file   Highest education level: Not on file  Occupational History   Not on file  Tobacco Use   Smoking status: Former    Current packs/day: 0.00    Types: Cigarettes, E-cigarettes    Quit date: 06/24/2022    Years since quitting: 1.4   Smokeless tobacco: Never   Tobacco comments:    I vape everyday.  [facility] 09/22/2022  Vaping Use   Vaping status: Some Days   Substances: Nicotine   Substance and Sexual Activity   Alcohol  use: Never   Drug use: Not on file    Comment: on subxone currently   Sexual activity: Yes    Partners: Female    Birth control/protection: None  Other Topics Concern   Not on file  Social History Narrative   Not on file   Social Drivers of Health   Financial Resource Strain: Not on file  Food Insecurity: No Food Insecurity (03/30/2023)   Hunger Vital Sign    Worried About Running Out of Food in the Last Year: Never true    Ran Out of Food in the Last Year: Never true  Transportation Needs: No Transportation Needs (03/30/2023)   PRAPARE - Administrator, Civil Service (Medical): No    Lack of Transportation (Non-Medical): No  Physical Activity: Not on file  Stress: Not on file  Social Connections: Not on file     Family History: The patient's family history includes Heart disease in his mother; Heart failure in his father; Rashes / Skin problems in his mother.  ROS:   Please see the history of present illness.     All other systems reviewed and are negative.  EKGs/Labs/Other Studies Reviewed:    The following studies were reviewed today: EKG Interpretation Date/Time:  Tuesday December 08 2023 15:44:15 EST Ventricular Rate:  68 PR Interval:  170 QRS Duration:  100 QT Interval:  418 QTC Calculation: 444 R Axis:   34  Text Interpretation: Normal sinus rhythm  Normal ECG When compared with ECG of 31-Jul-2005 08:05, Nonspecific T wave abnormality no longer evident in Inferior leads Confirmed by Dannel Rafter [phone]) on 12/08/2023 3:45:39 PM  Recent Labs: 03/25/2023: ALT 19 03/27/2023: BUN 18; Creatinine, Ser 0.82; Potassium 4.1; Sodium 135 04/10/2023: Hemoglobin 13.5; Platelets 299  Recent Lipid Panel No results found for: CHOL, TRIG, HDL, CHOLHDL, VLDL, LDLCALC, LDLDIRECT  Dated 04/13/23: CBC, CMET normal. Cholesterol 189, triglycerides 148, HDL 40, LDL 122. TSH normal  Risk Assessment/Calculations:        Physical Exam:    VS:  BP (!) 150/88 (BP Location: Left Arm, Patient Position: Sitting, Cuff Size: Large)   Pulse 79   Resp 17   Ht 6' 3 (1.905 m)   Wt 236 lb (107 kg)   SpO2 95%  BMI 29.50 kg/m    Repeat BP 150/80 Wt Readings from Last 3 Encounters:  12/08/23 236 lb (107 kg)  05/12/23 233 lb (105.7 kg)  04/10/23 225 lb (102.1 kg)     GEN:  Well nourished, well developed in no acute distress HEENT: Normal NECK: No JVD; No carotid bruits LYMPHATICS: No lymphadenopathy CARDIAC: RRR, no murmurs, rubs, gallops RESPIRATORY:  Clear to auscultation without rales, wheezing or rhonchi  ABDOMEN: Soft, non-tender, non-distended MUSCULOSKELETAL:  compression in place; + scoliosis SKIN: Warm and dry NEUROLOGIC:  Alert and oriented x 3 PSYCHIATRIC:  Normal affect   ASSESSMENT:    1. Precordial pain   2. Tobacco abuse   3. Hypercholesteremia    PLAN:    In order of problems listed above:  Chest pain. Risk factors of HLD, tobacco abuse and elevated BP. Prior cardiac cath in 2007 was OK. Recommend coronary CTA to assess current risk Tobacco abuse. Encourage smoking cessation HLD LDL 122. If he does have CAD will recommend statin.  Elevated BP- denies history of HTN will monitor Venous insufficiency - has appt with [facility] - Dr Pearline           Medication Adjustments/Labs and Tests Ordered: Current medicines are  reviewed at length with the patient today.  Concerns regarding medicines are outlined above.  Orders Placed This Encounter  Procedures   CT CORONARY MORPH W/CTA COR W/SCORE W/CA W/CM &/OR WO/CM   Basic metabolic panel with GFR   EKG 87-Ozji   Meds ordered this encounter  Medications   metoprolol tartrate (LOPRESSOR) 100 MG tablet    Sig: Take 100 mg 2 hours before Coronary CTA    Dispense:  1 tablet    Refill:  0    Patient Instructions  Medication Instructions:  Continue same medications  Lab Work: Bmet today  Testing/Procedures: Coronary CTA  will be scheduled after approved by insurance   Follow instructions below  Follow-Up: At [facility], you and your health needs are our priority.  As part of our continuing mission to provide you with exceptional heart care, our providers are all part of one team.  This team includes your primary Cardiologist (physician) and Advanced Practice Providers or APPs (Physician Assistants and Nurse Practitioners) who all work together to provide you with the care you need, when you need it.  Your next appointment:  To Be Determined    Provider:  Dr.Jrake Rodriquez             Your cardiac CT will be scheduled at one of the below locations:   [facility] [address] [place], [place] [address] [phone] (Severe contrast allergies only)  OR   [facility] [address] [place], [place] [address] [phone]  OR   [facility] [address] [place], [place] [address] [phone]  OR   [facility] [address]  [place], [place] [address]  OR   [facility] [address] [place], [place] [phone]  If scheduled at [facility], please arrive at the [facility] and Children's Entrance (Entrance C2) of [facility] 30 minutes prior to test start time. You can use the FREE valet parking offered at entrance C  (encouraged to control the heart rate for the test)  Proceed to the [facility] Radiology Department (first floor) to check-in and test prep.  All radiology patients and guests should use entrance C2 at [facility], accessed from [facility], even though the hospital's physical address listed is [address]  If scheduled at the Heart and Vascular Tower at [address], please enter the parking lot using the [address] entrance and use the FREE valet service at the patient drop-off area. Enter the building and check-in with registration on the main floor.  If scheduled at [facility], please arrive to the Heart and Vascular Center 15 mins early for check-in and test prep.  There is spacious parking and easy access to the radiology department from the [facility] Heart and Vascular entrance. Please enter here and check-in with the desk attendant.   If scheduled at [facility], please arrive 30 minutes early for check-in and test prep.  Please follow these instructions carefully (unless otherwise directed):  An IV will be required for this test and Nitroglycerin will be given.  Hold all erectile dysfunction medications at least 3 days (72 hrs) prior to test. (Ie viagra, cialis, sildenafil, tadalafil, etc)   On the Night Before the Test: Be sure to Drink plenty of water. Do not consume any caffeinated/decaffeinated beverages or chocolate 12 hours prior to your test. Do not take any antihistamines 12 hours prior to your test.   On the Day of the Test: Drink plenty of water until 1 hour prior to the test. Do not eat any food 1 hour prior to test. You may take your regular medications prior to the test.  Take metoprolol 100 mg two hours prior to test. If you take Furosemide /Hydrochlorothiazide/Spironolactone/Chlorthalidone, please HOLD on the morning of the test. Patients who wear a continuous glucose monitor MUST remove the device  prior to scanning.         After the Test: Drink plenty of water. After receiving IV contrast, you may experience a mild flushed feeling. This is normal. On occasion, you may experience a mild rash up to 24 hours after the test. This is not dangerous. If this occurs, you can take Benadryl  25 mg, Zyrtec, Claritin, or Allegra and increase your fluid intake. (Patients taking Tikosyn should avoid Benadryl , and may take Zyrtec, Claritin, or Allegra) If you experience trouble breathing, this can be serious. If it is severe call 911 IMMEDIATELY. If it is mild, please call our office.  We will call to schedule your test 2-4 weeks out understanding that some insurance companies will need an authorization prior to the service being performed.   For more information and frequently asked questions, please visit our website : [email or web address]  For non-scheduling related questions, please contact the cardiac imaging nurse navigator should you have any questions/concerns: Cardiac Imaging Nurse Navigators Direct Office Dial: [phone]   For scheduling needs, including cancellations and rescheduling, please call Brittany, [phone].     We recommend signing up for the patient portal called MyChart.  Sign up information is provided on this After Visit Summary.  MyChart is used to connect with patients for Virtual Visits (Telemedicine).  Patients are able to view lab/test results, encounter notes, upcoming appointments, etc.  Non-urgent messages can be sent to your provider as well.   To learn more about what you can do with MyChart, go to [email or web address].      Signed, Kevyn Wengert, MD  12/08/2023 4:21 PM    [facility]

## 2023-12-08 ENCOUNTER — Encounter: Payer: Self-pay | Admitting: Cardiology

## 2023-12-08 ENCOUNTER — Ambulatory Visit: Admitting: Podiatry

## 2023-12-08 ENCOUNTER — Ambulatory Visit: Attending: Cardiology | Admitting: Cardiology

## 2023-12-08 VITALS — BP 150/88 | HR 79 | Resp 17 | Ht 75.0 in | Wt 236.0 lb

## 2023-12-08 DIAGNOSIS — E7800 Pure hypercholesterolemia, unspecified: Secondary | ICD-10-CM | POA: Diagnosis not present

## 2023-12-08 DIAGNOSIS — R072 Precordial pain: Secondary | ICD-10-CM

## 2023-12-08 DIAGNOSIS — Z720 Tobacco use: Secondary | ICD-10-CM | POA: Diagnosis not present

## 2023-12-08 MED ORDER — METOPROLOL TARTRATE 100 MG PO TABS
ORAL_TABLET | ORAL | 0 refills | Status: AC
Start: 2023-12-08 — End: ?

## 2023-12-08 NOTE — Patient Instructions (Addendum)
 Medication Instructions:  Continue same medications  Lab Work: Bmet today  Testing/Procedures: Coronary CTA  will be scheduled after approved by insurance   Follow instructions below  Follow-Up: At [facility], you and your health needs are our priority.  As part of our continuing mission to provide you with exceptional heart care, our providers are all part of one team.  This team includes your primary Cardiologist (physician) and Advanced Practice Providers or APPs (Physician Assistants and Nurse Practitioners) who all work together to provide you with the care you need, when you need it.  Your next appointment:  To Be Determined    Provider:  Dr.Jordan             Your cardiac CT will be scheduled at one of the below locations:   [facility] [address] [place], [place] [address] [phone] (Severe contrast allergies only)  OR   [facility] [address] [place], [place] [address] [phone]  OR   [facility] [address] [place], [place] [address] [phone]  OR   [facility] [address]  [place], [place] [address]  OR   [facility] [address] [place], [place] [phone]  If scheduled at [facility], please arrive at the [facility] and Children's Entrance (Entrance C2) of [facility] 30 minutes prior to test start time. You can use the FREE valet parking offered at entrance C (encouraged to control the heart rate for the test)  Proceed to the [facility] Radiology Department (first floor) to check-in and test prep.  All radiology patients and guests should use entrance C2 at [facility], accessed from [facility], even though the hospital's physical address listed is [address].  If scheduled at the Heart and Vascular Tower at [address], please enter the parking lot using the [address]  street entrance and use the FREE valet service at the patient drop-off area. Enter the building and check-in with registration on the main floor.  If scheduled at [facility], please arrive to the Heart and Vascular Center 15 mins early for check-in and test prep.  There is spacious parking and easy access to the radiology department from the [facility] entrance. Please enter here and check-in with the desk attendant.   If scheduled at [facility], please arrive 30 minutes early for check-in and test prep.  Please follow these instructions carefully (unless otherwise directed):  An IV will be required for this test and Nitroglycerin will be given.  Hold all erectile dysfunction medications at least 3 days (72 hrs) prior to test. (Ie viagra, cialis, sildenafil, tadalafil, etc)   On the Night Before the Test: Be sure to Drink plenty of water. Do not consume any caffeinated/decaffeinated beverages or chocolate 12 hours prior to your test. Do not take any antihistamines 12 hours prior to your test.   On the Day of the Test: Drink plenty of water until 1 hour prior to the test. Do not eat any food 1 hour prior to test. You may take your regular medications prior to the test.  Take metoprolol 100 mg two hours prior to test. If you take Furosemide /Hydrochlorothiazide/Spironolactone/Chlorthalidone, please HOLD on the morning of the test. Patients who wear a continuous glucose monitor MUST remove the device prior to scanning.         After the Test: Drink  plenty of water. After receiving IV contrast, you may experience a mild flushed feeling. This is normal. On occasion, you may experience a mild rash up to 24 hours after the test. This is not dangerous. If this occurs, you can take Benadryl  25 mg, Zyrtec, Claritin, or Allegra and increase your fluid intake. (Patients taking Tikosyn should avoid Benadryl , and may take Zyrtec, Claritin, or  Allegra) If you experience trouble breathing, this can be serious. If it is severe call 911 IMMEDIATELY. If it is mild, please call our office.  We will call to schedule your test 2-4 weeks out understanding that some insurance companies will need an authorization prior to the service being performed.   For more information and frequently asked questions, please visit our website : [email or web address]  For non-scheduling related questions, please contact the cardiac imaging nurse navigator should you have any questions/concerns: Cardiac Imaging Nurse Navigators Direct Office Dial: [phone]   For scheduling needs, including cancellations and rescheduling, please call Brittany, [phone].     We recommend signing up for the patient portal called MyChart.  Sign up information is provided on this After Visit Summary.  MyChart is used to connect with patients for Virtual Visits (Telemedicine).  Patients are able to view lab/test results, encounter notes, upcoming appointments, etc.  Non-urgent messages can be sent to your provider as well.   To learn more about what you can do with MyChart, go to [email or web address].

## 2023-12-09 ENCOUNTER — Ambulatory Visit: Payer: Self-pay | Admitting: Cardiology

## 2023-12-09 LAB — BASIC METABOLIC PANEL WITH GFR
BUN/Creatinine Ratio: 25 — ABNORMAL HIGH (ref 10–24)
BUN: 21 mg/dL (ref 8–27)
CO2: 22 mmol/L (ref 20–29)
Calcium: 9.4 mg/dL (ref 8.6–10.2)
Chloride: 101 mmol/L (ref 96–106)
Creatinine, Ser: 0.85 mg/dL (ref 0.76–1.27)
Glucose: 83 mg/dL (ref 70–99)
Potassium: 4.4 mmol/L (ref 3.5–5.2)
Sodium: 138 mmol/L (ref 134–144)
eGFR: 99 mL/min/1.73 (ref >59)

## 2023-12-15 DIAGNOSIS — F1120 Opioid dependence, uncomplicated: Secondary | ICD-10-CM | POA: Diagnosis not present

## 2023-12-16 DIAGNOSIS — F1120 Opioid dependence, uncomplicated: Secondary | ICD-10-CM | POA: Diagnosis not present

## 2023-12-18 DIAGNOSIS — R600 Localized edema: Secondary | ICD-10-CM | POA: Diagnosis not present

## 2023-12-18 DIAGNOSIS — L0390 Cellulitis, unspecified: Secondary | ICD-10-CM | POA: Diagnosis not present

## 2023-12-18 DIAGNOSIS — M549 Dorsalgia, unspecified: Secondary | ICD-10-CM | POA: Diagnosis not present

## 2023-12-18 DIAGNOSIS — F1120 Opioid dependence, uncomplicated: Secondary | ICD-10-CM | POA: Diagnosis not present

## 2024-01-13 DIAGNOSIS — F1120 Opioid dependence, uncomplicated: Secondary | ICD-10-CM | POA: Diagnosis not present

## 2024-01-18 ENCOUNTER — Other Ambulatory Visit: Payer: Self-pay

## 2024-01-18 DIAGNOSIS — I872 Venous insufficiency (chronic) (peripheral): Secondary | ICD-10-CM

## 2024-01-19 ENCOUNTER — Encounter (HOSPITAL_COMMUNITY): Payer: Self-pay

## 2024-01-20 DIAGNOSIS — F1120 Opioid dependence, uncomplicated: Secondary | ICD-10-CM | POA: Diagnosis not present

## 2024-01-20 DIAGNOSIS — R600 Localized edema: Secondary | ICD-10-CM | POA: Diagnosis not present

## 2024-01-20 DIAGNOSIS — M1990 Unspecified osteoarthritis, unspecified site: Secondary | ICD-10-CM | POA: Diagnosis not present

## 2024-01-20 DIAGNOSIS — I10 Essential (primary) hypertension: Secondary | ICD-10-CM | POA: Diagnosis not present

## 2024-01-22 ENCOUNTER — Ambulatory Visit (HOSPITAL_COMMUNITY)
Admission: RE | Admit: 2024-01-22 | Discharge: 2024-01-22 | Disposition: A | Discharge status: Home / Self Care | Source: Ambulatory Visit | Attending: Cardiology | Admitting: Cardiology

## 2024-01-22 DIAGNOSIS — R072 Precordial pain: Secondary | ICD-10-CM | POA: Diagnosis not present

## 2024-01-22 MED ORDER — IOHEXOL 350 MG/ML SOLN
100.0000 mL | Freq: Once | INTRAVENOUS | Status: AC | PRN
  Administered 2024-01-22: 100 mL via INTRAVENOUS

## 2024-01-22 MED ORDER — NITROGLYCERIN 0.4 MG SL SUBL
0.8000 mg | SUBLINGUAL_TABLET | Freq: Once | SUBLINGUAL | Status: AC
  Administered 2024-01-22: 0.8 mg via SUBLINGUAL

## 2024-01-27 DIAGNOSIS — M439 Deforming dorsopathy, unspecified: Secondary | ICD-10-CM | POA: Diagnosis not present

## 2024-01-27 DIAGNOSIS — M5416 Radiculopathy, lumbar region: Secondary | ICD-10-CM | POA: Diagnosis not present

## 2024-01-27 DIAGNOSIS — M419 Scoliosis, unspecified: Secondary | ICD-10-CM | POA: Diagnosis not present

## 2024-01-27 DIAGNOSIS — M4035 Flatback syndrome, thoracolumbar region: Secondary | ICD-10-CM | POA: Diagnosis not present

## 2024-02-03 ENCOUNTER — Other Ambulatory Visit: Payer: Self-pay | Admitting: Physician Assistant

## 2024-02-03 DIAGNOSIS — M419 Scoliosis, unspecified: Secondary | ICD-10-CM

## 2024-02-03 DIAGNOSIS — M439 Deforming dorsopathy, unspecified: Secondary | ICD-10-CM

## 2024-02-03 DIAGNOSIS — M5416 Radiculopathy, lumbar region: Secondary | ICD-10-CM

## 2024-02-03 DIAGNOSIS — M4035 Flatback syndrome, thoracolumbar region: Secondary | ICD-10-CM

## 2024-02-05 ENCOUNTER — Other Ambulatory Visit: Payer: Self-pay

## 2024-02-05 DIAGNOSIS — E782 Mixed hyperlipidemia: Secondary | ICD-10-CM

## 2024-02-05 MED ORDER — ROSUVASTATIN CALCIUM 10 MG PO TABS: 10.0000 mg | ORAL_TABLET | Freq: Every day | ORAL | 3 refills | Status: AC

## 2024-02-11 NOTE — Progress Notes (Unsigned)
 "  Patient ID: [name], male   DOB: 06-02-1965, 59 y.o.   MRN: [id number]  Reason for Consult: No chief complaint on file.   Referred by Malvin Marsa FALCON, DPM  Subjective:     HPI  [name] is a 59 y.o. male who presents for evaluation of lower extremity *** Timeframe: *** Symptoms: *** Varicosities: *** Previous wounds: *** Previous DVT: *** In compression: ***  Past Medical History:  Diagnosis Date   Cellulitis of left foot    Constipation 03/25/2023   Current smoker 08/07/2016   Elevated blood-pressure reading without diagnosis of hypertension 02/19/2021   Hypertension 07/21/2013   Leg length discrepancy 02/19/2021   Lymphedema    Mixed hyperlipidemia 02/19/2021   Nondependent opioid abuse (HCC) 02/19/2021   Obstructive sleep apnea 02/19/2021   Osteoarthritis 07/21/2013   Osteomyelitis (HCC) 03/23/2023   Pain due to onychomycosis of toenails of both feet 05/21/2023   Pedal edema 02/19/2021   Peripheral neuropathy 11/27/2021   Peripheral venous insufficiency 02/19/2021   Pyogenic inflammation of bone (HCC) 03/23/2023   Scoliosis    Seasonal allergic rhinitis 07/21/2013   Sepsis due to cellulitis (HCC) 11/27/2021   Sleep apnea    Venous stasis dermatitis of right lower extremity 02/19/2021   Family History  Problem Relation Age of Onset   Heart disease Mother    Rashes / Skin problems Mother    Heart failure Father    Past Surgical History:  Procedure Laterality Date   IRRIGATION AND DEBRIDEMENT ABSCESS Left 11/27/2021   Procedure: IRRIGATION AND DEBRIDEMENT foot  3rd ray amputatiion;  Surgeon: Malvin Marsa FALCON, DPM;  Location: [facility];  Service: Podiatry;  Laterality: Left;   IRRIGATION AND DEBRIDEMENT FOOT Left 03/26/2023   Procedure: IRRIGATION AND DEBRIDEMENT LEFT FOOT WITH GRAFT AND ANTIBIOTIC BEADS WITH DELAYED CLOSURE;  Surgeon: Malvin Marsa FALCON, DPM;  Location: [facility] OR;  Service: Orthopedics/Podiatry;  Laterality: Left;   Left 3rd met head resection   METATARSAL HEAD EXCISION Left 03/24/2023   Procedure: LEFT FOOT INCISION AND DRAINAGE WITH THIRD METATARSAL HEAD EXCISION, ANTIBIOTIC BEADS;  Surgeon: Malvin Marsa FALCON, DPM;  Location: [facility] OR;  Service: Orthopedics/Podiatry;  Laterality: Left;  Left 3rd met head resection    Short Social History:  Social History   Tobacco Use   Smoking status: Former    Current packs/day: 0.00    Types: Cigarettes, E-cigarettes    Quit date: 06/24/2022    Years since quitting: 1.6   Smokeless tobacco: Never   Tobacco comments:    I vape everyday.  [facility] 09/22/2022  Substance Use Topics   Alcohol  use: Never    Allergies[1]  Current Outpatient Medications  Medication Sig Dispense Refill   acetaminophen  (TYLENOL ) 325 MG tablet Take 2 tablets (650 mg total) by mouth every 6 (six) hours as needed for mild pain (pain score 1-3).     Buprenorphine  HCl-Naloxone  HCl 8-2 MG FILM Place 0.5 Film under the tongue in the morning, at noon, in the evening, and at bedtime.     cloNIDine  (CATAPRES ) 0.1 MG tablet Take 0.1 mg by mouth daily.     furosemide  (LASIX ) 40 MG tablet Take 40 mg by mouth every morning.     KLOR-CON  M20 20 MEQ tablet Take 20 mEq by mouth daily.     metoprolol  tartrate (LOPRESSOR ) 100 MG tablet Take 100 mg 2 hours before Coronary CTA 1 tablet 0   naproxen  (NAPROSYN ) 500 MG tablet Take 500 mg by mouth every 12 (  twelve) hours as needed for moderate pain (pain score 4-6).     ondansetron  (ZOFRAN ) 4 MG tablet Take 1 tablet (4 mg total) by mouth every 6 (six) hours as needed for nausea. 20 tablet 0   polyethylene glycol powder (GLYCOLAX /MIRALAX ) 17 GM/SCOOP powder Mix as directed and take 1 capful (17 g) with water by mouth 2 (two) times daily. 238 g 0   rosuvastatin  (CRESTOR ) 10 MG tablet Take 1 tablet (10 mg total) by mouth daily. 90 tablet 3   senna-docusate (SENOKOT-S) 8.6-50 MG tablet Take 1 tablet by mouth 2 (two) times daily. 20 tablet 0   No current  facility-administered medications for this visit.    REVIEW OF SYSTEMS All other systems were reviewed and are negative    Objective:  Objective   There were no vitals filed for this visit. There is no height or weight on file to calculate BMI.  Physical Exam General: no acute distress Cardiac: hemodynamically stable Extremities: *** Vascular:   Right: palpable DP, PT***  Left: palpable DP, PT***   Data: Reflux study ***      Assessment/Plan:     [name] is a 59 y.o. male with chronic venous insufficiency with C*** disease and reflux noted in *** I explained the foundation of CVI treatment of compression and elevation I recommended medical grade graduated compression stockings and intermittent leg elevation We also discussed that many patients find symptom improvement with exercise and if they have access to a pool should attempt water aerobics.  Plan to follow up in 3 months with Dr. Serene or Dr. Sheree with a *** reflux study in order to complete bilateral imaging      John GORMAN Serve MD [facility]    [1] No Known Allergies  "

## 2024-02-12 ENCOUNTER — Ambulatory Visit (HOSPITAL_COMMUNITY)

## 2024-02-12 ENCOUNTER — Ambulatory Visit (HOSPITAL_COMMUNITY): Admitting: Vascular Surgery

## 2024-02-19 ENCOUNTER — Other Ambulatory Visit

## 2024-02-25 NOTE — Discharge Instructions (Addendum)

## 2024-02-26 ENCOUNTER — Ambulatory Visit
Admission: RE | Admit: 2024-02-26 | Discharge: 2024-02-26 | Disposition: A | Source: Ambulatory Visit | Attending: Physician Assistant | Admitting: Physician Assistant

## 2024-02-26 ENCOUNTER — Inpatient Hospital Stay
Admission: RE | Admit: 2024-02-26 | Discharge: 2024-02-26 | Disposition: A | Payer: Self-pay | Source: Ambulatory Visit | Attending: Physician Assistant | Admitting: Physician Assistant

## 2024-02-26 ENCOUNTER — Other Ambulatory Visit: Payer: Self-pay | Admitting: Physician Assistant

## 2024-02-26 ENCOUNTER — Telehealth: Payer: Self-pay

## 2024-02-26 DIAGNOSIS — M439 Deforming dorsopathy, unspecified: Secondary | ICD-10-CM

## 2024-02-26 DIAGNOSIS — M4035 Flatback syndrome, thoracolumbar region: Secondary | ICD-10-CM

## 2024-02-26 DIAGNOSIS — M419 Scoliosis, unspecified: Secondary | ICD-10-CM

## 2024-02-26 DIAGNOSIS — R52 Pain, unspecified: Secondary | ICD-10-CM

## 2024-02-26 DIAGNOSIS — M5416 Radiculopathy, lumbar region: Secondary | ICD-10-CM

## 2024-02-26 MED ORDER — ONDANSETRON HCL 4 MG/2ML IJ SOLN: 4.0000 mg | Freq: Once | INTRAMUSCULAR | Status: DC | PRN

## 2024-02-26 MED ORDER — DIAZEPAM 5 MG PO TABS: 10.0000 mg | ORAL_TABLET | Freq: Once | ORAL | Status: DC

## 2024-02-26 MED ORDER — IOPAMIDOL (ISOVUE-M 300) INJECTION 61%
10.0000 mL | Freq: Once | INTRAMUSCULAR | Status: AC | PRN
  Administered 2024-02-26: 10 mL via INTRATHECAL

## 2024-02-26 MED ORDER — MEPERIDINE HCL 50 MG/ML IJ SOLN
50.0000 mg | Freq: Once | INTRAMUSCULAR | Status: AC | PRN
  Administered 2024-02-26: 50 mg via INTRAMUSCULAR

## 2024-02-26 NOTE — Telephone Encounter (Signed)
 Pre myelo prep

## 2024-06-17 ENCOUNTER — Encounter: Admitting: Vascular Surgery

## 2024-06-17 ENCOUNTER — Ambulatory Visit (HOSPITAL_COMMUNITY)
